# Patient Record
Sex: Female | Born: 1995 | Hispanic: No | Marital: Single | Smoking: Never smoker
Health system: Southern US, Community
[De-identification: ages and names within clinical notes are randomized; demographics above are authoritative.]

## PROBLEM LIST (undated history)

## (undated) ENCOUNTER — Inpatient Hospital Stay (HOSPITAL_COMMUNITY): Payer: Self-pay

## (undated) ENCOUNTER — Emergency Department (HOSPITAL_COMMUNITY): Admission: EM | Payer: Self-pay | Source: Home / Self Care

## (undated) DIAGNOSIS — N390 Urinary tract infection, site not specified: Secondary | ICD-10-CM

## (undated) DIAGNOSIS — O1490 Unspecified pre-eclampsia, unspecified trimester: Secondary | ICD-10-CM

## (undated) DIAGNOSIS — A539 Syphilis, unspecified: Secondary | ICD-10-CM

## (undated) DIAGNOSIS — A749 Chlamydial infection, unspecified: Secondary | ICD-10-CM

## (undated) DIAGNOSIS — A549 Gonococcal infection, unspecified: Secondary | ICD-10-CM

## (undated) DIAGNOSIS — B999 Unspecified infectious disease: Secondary | ICD-10-CM

## (undated) DIAGNOSIS — D3A8 Other benign neuroendocrine tumors: Secondary | ICD-10-CM

## (undated) DIAGNOSIS — A64 Unspecified sexually transmitted disease: Secondary | ICD-10-CM

## (undated) DIAGNOSIS — I10 Essential (primary) hypertension: Secondary | ICD-10-CM

## (undated) HISTORY — PX: OTHER SURGICAL HISTORY: SHX169

## (undated) HISTORY — PX: HAND SURGERY: SHX662

## (undated) HISTORY — DX: Chlamydial infection, unspecified: A74.9

## (undated) HISTORY — PX: RHYTIDECTOMY NECK / CHEEK / CHIN: SUR1286

## (undated) HISTORY — DX: Unspecified sexually transmitted disease: A64

---
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
## 2014-01-14 ENCOUNTER — Emergency Department (HOSPITAL_COMMUNITY)
Admission: EM | Admit: 2014-01-14 | Discharge: 2014-01-14 | Disposition: A | Discharge status: Home / Self Care | Payer: Medicaid Other | Attending: Emergency Medicine | Admitting: Emergency Medicine

## 2014-01-14 DIAGNOSIS — R109 Unspecified abdominal pain: Secondary | ICD-10-CM

## 2014-01-14 DIAGNOSIS — R1031 Right lower quadrant pain: Secondary | ICD-10-CM | POA: Insufficient documentation

## 2014-01-14 DIAGNOSIS — M549 Dorsalgia, unspecified: Secondary | ICD-10-CM | POA: Insufficient documentation

## 2014-01-14 DIAGNOSIS — O26899 Other specified pregnancy related conditions, unspecified trimester: Secondary | ICD-10-CM

## 2014-01-14 DIAGNOSIS — O9989 Other specified diseases and conditions complicating pregnancy, childbirth and the puerperium: Secondary | ICD-10-CM | POA: Insufficient documentation

## 2014-01-14 LAB — CBC WITH DIFFERENTIAL/PLATELET
Basophils Absolute: 0 10*3/uL (ref 0.0–0.1)
Basophils Relative: 0 % (ref 0–1)
Eosinophils Absolute: 0.1 10*3/uL (ref 0.0–0.7)
Eosinophils Relative: 1 % (ref 0–5)
HCT: 32 % — ABNORMAL LOW (ref 36.0–46.0)
Hemoglobin: 10.9 g/dL — ABNORMAL LOW (ref 12.0–15.0)
Lymphocytes Relative: 27 % (ref 12–46)
Lymphs Abs: 2.8 10*3/uL (ref 0.7–4.0)
MCH: 30.4 pg (ref 26.0–34.0)
MCHC: 34.1 g/dL (ref 30.0–36.0)
MCV: 89.4 fL (ref 78.0–100.0)
Monocytes Absolute: 0.6 10*3/uL (ref 0.1–1.0)
Monocytes Relative: 6 % (ref 3–12)
Neutro Abs: 6.7 10*3/uL (ref 1.7–7.7)
Neutrophils Relative %: 66 % (ref 43–77)
Platelets: 238 10*3/uL (ref 150–400)
RBC: 3.58 MIL/uL — ABNORMAL LOW (ref 3.87–5.11)
RDW: 13.1 % (ref 11.5–15.5)
WBC: 10.2 10*3/uL (ref 4.0–10.5)

## 2014-01-14 LAB — URINALYSIS, ROUTINE W REFLEX MICROSCOPIC
BILIRUBIN URINE: NEGATIVE
Glucose, UA: NEGATIVE mg/dL
Hgb urine dipstick: NEGATIVE
KETONES UR: NEGATIVE mg/dL
NITRITE: NEGATIVE
PH: 6.5 (ref 5.0–8.0)
Protein, ur: NEGATIVE mg/dL
Specific Gravity, Urine: 1.026 (ref 1.005–1.030)
Urobilinogen, UA: 0.2 mg/dL (ref 0.0–1.0)

## 2014-01-14 LAB — URINE MICROSCOPIC-ADD ON

## 2014-01-14 LAB — BASIC METABOLIC PANEL
BUN: 11 mg/dL (ref 6–23)
CO2: 23 mEq/L (ref 19–32)
Calcium: 9.2 mg/dL (ref 8.4–10.5)
Chloride: 105 mEq/L (ref 96–112)
Creatinine, Ser: 0.53 mg/dL (ref 0.50–1.10)
GFR calc Af Amer: >90 mL/min (ref >90)
GFR calc non Af Amer: >90 mL/min (ref >90)
Glucose, Bld: 75 mg/dL (ref 70–99)
Potassium: 4 mEq/L (ref 3.7–5.3)
Sodium: 141 mEq/L (ref 137–147)

## 2014-01-14 LAB — POC URINE PREG, ED: Preg Test, Ur: POSITIVE — AB

## 2014-01-14 MED ORDER — SODIUM CHLORIDE 0.9 % IV BOLUS (SEPSIS)
1000.0000 mL | Freq: Once | INTRAVENOUS | Status: AC
  Administered 2014-01-14: 1000 mL via INTRAVENOUS

## 2014-01-14 NOTE — ED Notes (Signed)
Dr. Jeraldine LootsLockwood at performing bedside US exam.
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
## 2014-01-14 NOTE — ED Notes (Signed)
Discharge instructions reviewed with pt. Pt verbalized understanding.   

## 2014-01-14 NOTE — Discharge Instructions (Signed)
You may take acetaminophen every 4-6 hours as needed for pain.   You may also try alternating cool and warm compresses to help ease pain.  Be sure to walk at least 30 minutes a day.  See below for further instructions.
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
## 2014-01-15 NOTE — ED Provider Notes (Signed)
  This was a shared visit with a mid-level provided (NP or PA).  Throughout the patient's course I was available for consultation/collaboration.  I saw the ECG (if appropriate), relevant labs and studies - I agree with the interpretation.  On my exam the patient was in no distress.    EMERGENCY DEPARTMENT US PREGNANCY "Study: Limited Ultrasound of the Pelvis"  INDICATIONS:Pregnancy(required) Multiple views of the uterus and pelvic cavity are obtained with a multi-frequency probe.  APPROACH:Transabdominal   PERFORMED BY: Myself  IMAGES ARCHIVED?: No - guest machine  LIMITATIONS: Decompressed bladder and Emergent procedure  PREGNANCY FREE FLUID: None  PREGNANCY UTERUS FINDINGS:Uterus normal size ADNEXAL FINDINGS:Left ovary not seen and Right ovary not seen  PREGNANCY FINDINGS: Intrauterine gestational sac noted and Fetal heart activity seen  INTERPRETATION: Viable intrauterine pregnancy  GESTATIONAL AGE, ESTIMATE: ~3793m  FETAL HEART RATE: 140-150  COMMENT(Estimate of Gestational Age):  ~5     Gerhard Munchobert Glora Hulgan, MD 01/15/14 2011

## 2014-02-08 ENCOUNTER — Emergency Department (HOSPITAL_COMMUNITY)
Admission: EM | Admit: 2014-02-08 | Discharge: 2014-02-09 | Disposition: A | Discharge status: Home / Self Care | Payer: Medicaid Other | Attending: Emergency Medicine | Admitting: Emergency Medicine

## 2014-02-08 ENCOUNTER — Encounter (HOSPITAL_COMMUNITY): Payer: Self-pay | Admitting: Emergency Medicine

## 2014-02-08 DIAGNOSIS — Z79899 Other long term (current) drug therapy: Secondary | ICD-10-CM | POA: Insufficient documentation

## 2014-02-08 DIAGNOSIS — O209 Hemorrhage in early pregnancy, unspecified: Secondary | ICD-10-CM | POA: Insufficient documentation

## 2014-02-08 DIAGNOSIS — O4690 Antepartum hemorrhage, unspecified, unspecified trimester: Secondary | ICD-10-CM

## 2014-02-08 LAB — CBC WITH DIFFERENTIAL/PLATELET
Basophils Absolute: 0 10*3/uL (ref 0.0–0.1)
Basophils Relative: 0 % (ref 0–1)
EOS PCT: 1 % (ref 0–5)
Eosinophils Absolute: 0.1 10*3/uL (ref 0.0–0.7)
HEMATOCRIT: 32.1 % — AB (ref 36.0–46.0)
Hemoglobin: 10.8 g/dL — ABNORMAL LOW (ref 12.0–15.0)
LYMPHS ABS: 3 10*3/uL (ref 0.7–4.0)
LYMPHS PCT: 22 % (ref 12–46)
MCH: 30.3 pg (ref 26.0–34.0)
MCHC: 33.6 g/dL (ref 30.0–36.0)
MCV: 89.9 fL (ref 78.0–100.0)
MONO ABS: 0.8 10*3/uL (ref 0.1–1.0)
Monocytes Relative: 6 % (ref 3–12)
Neutro Abs: 9.7 10*3/uL — ABNORMAL HIGH (ref 1.7–7.7)
Neutrophils Relative %: 71 % (ref 43–77)
Platelets: 256 10*3/uL (ref 150–400)
RBC: 3.57 MIL/uL — AB (ref 3.87–5.11)
RDW: 13.9 % (ref 11.5–15.5)
WBC: 13.7 10*3/uL — AB (ref 4.0–10.5)

## 2014-02-08 LAB — BASIC METABOLIC PANEL
Anion gap: 12 (ref 5–15)
BUN: 8 mg/dL (ref 6–23)
CALCIUM: 9.2 mg/dL (ref 8.4–10.5)
CHLORIDE: 104 meq/L (ref 96–112)
CO2: 23 meq/L (ref 19–32)
Creatinine, Ser: 0.48 mg/dL — ABNORMAL LOW (ref 0.50–1.10)
GFR calc Af Amer: >90 mL/min (ref >90)
GFR calc non Af Amer: >90 mL/min (ref >90)
GLUCOSE: 81 mg/dL (ref 70–99)
Potassium: 3.7 mEq/L (ref 3.7–5.3)
SODIUM: 139 meq/L (ref 137–147)

## 2014-02-08 LAB — WET PREP, GENITAL
CLUE CELLS WET PREP: NONE SEEN
TRICH WET PREP: NONE SEEN
WBC, Wet Prep HPF POC: NONE SEEN
YEAST WET PREP: NONE SEEN

## 2014-02-08 LAB — ABO/RH: ABO/RH(D): O POS

## 2014-02-08 NOTE — ED Notes (Signed)
To ED POV for eval of vaginal bleeding after intercourse. Pt is [redacted] wks gestation. LMP 2/20 EDD 11/27. Pt denies abd pain but states she hasn't felt the baby move since bleeding started. States bleeding is intermittent now, but initially was a 'gush'

## 2014-02-08 NOTE — Discharge Instructions (Signed)
Vaginal Bleeding During Pregnancy, Second Trimester °A small amount of bleeding (spotting) from the vagina is relatively common in pregnancy. It usually stops on its own. Various things can cause bleeding or spotting in pregnancy. Some bleeding may be related to the pregnancy, and some may not. Sometimes the bleeding is normal and is not a problem. However, bleeding can also be a sign of something serious. Be sure to tell your health care provider about any vaginal bleeding right away. °Some possible causes of vaginal bleeding during the second trimester include: °· Infection, inflammation, or growths on the cervix.   °· The placenta may be partially or completely covering the opening of the cervix inside the uterus (placenta previa). °· The placenta may have separated from the uterus (abruption of the placenta).   °· You may be having early (preterm) labor.   °· The cervix may not be strong enough to keep a baby inside the uterus (cervical insufficiency).   °· Tiny cysts may have developed in the uterus instead of pregnancy tissue (molar pregnancy).  °HOME CARE INSTRUCTIONS  °Watch your condition for any changes. The following actions may help to lessen any discomfort you are feeling: °· Follow your health care provider's instructions for limiting your activity. If your health care provider orders bed rest, you may need to stay in bed and only get up to use the bathroom. However, your health care provider may allow you to continue light activity. °· If needed, make plans for someone to help with your regular activities and responsibilities while you are on bed rest. °· Keep track of the number of pads you use each day, how often you change pads, and how soaked (saturated) they are. Write this down. °· Do not use tampons. Do not douche. °· Do not have sexual intercourse or orgasms until approved by your health care provider. °· If you pass any tissue from your vagina, save the tissue so you can show it to your  health care provider. °· Only take over-the-counter or prescription medicines as directed by your health care provider. °· Do not take aspirin because it can make you bleed. °· Do not exercise or perform any strenuous activities or heavy lifting without your health care provider's permission. °· Keep all follow-up appointments as directed by your health care provider. °SEEK MEDICAL CARE IF: °· You have any vaginal bleeding during any part of your pregnancy. °· You have cramps or labor pains. °· You have a fever, not controlled by medicine. °SEEK IMMEDIATE MEDICAL CARE IF:  °· You have severe cramps in your back or belly (abdomen). °· You have contractions. °· You have chills. °· You pass large clots or tissue from your vagina. °· Your bleeding increases. °· You feel light-headed or weak, or you have fainting episodes. °· You are leaking fluid or have a gush of fluid from your vagina. °MAKE SURE YOU: °· Understand these instructions. °· Will watch your condition. °· Will get help right away if you are not doing well or get worse. °Document Released: 04/13/2005 Document Revised: 07/09/2013 Document Reviewed: 03/11/2013 °Exit[employer]® Patient Information ©2015 [employer]. This information is not intended to replace advice given to you by your health care provider. Make sure you discuss any questions you have with your health care provider. ° °

## 2014-02-08 NOTE — ED Provider Notes (Signed)
CSN: [id number]     Arrival date & time 02/08/14  2106 History   First MD Initiated Contact with Patient 02/08/14 2128     Chief Complaint  Patient presents with  . Vaginal Bleeding     (Consider location/radiation/quality/duration/timing/severity/associated sxs/prior Treatment) HPI Comments: Patient presents to the emergency department with chief complaint of vaginal bleeding. She is [redacted] weeks pregnant. She has had a confirmed IUP with formal ultrasound, as well bedside emergency department ultrasound earlier this month. She denies any abdominal pain. She states the bleeding started this evening, and she reports that it was like an initial gush of blood, which then slowed to a trickle. Patient reports having a history of miscarriage. Her OB/GYN is Dr. Tenny Crawoss  The history is provided by the patient. No language interpreter was used.    History reviewed. No pertinent past medical history. Past Surgical History  Procedure Laterality Date  . Rhytidectomy neck / cheek / chin    . Hand surgery      Left  hand  . Hand surgery      left   . Chin surgery     History reviewed. No pertinent family history. History  Substance Use Topics  . Smoking status: Never Smoker   . Smokeless tobacco: Never Used  . Alcohol Use: No   OB History   Grav Para Term Preterm Abortions TAB SAB Ect Mult Living   1              Review of Systems  All other systems reviewed and are negative.     Allergies  Review of patient's allergies indicates no known allergies.  Home Medications   Prior to Admission medications   Medication Sig Start Date End Date Taking? Authorizing Provider  acetaminophen (TYLENOL) 500 MG tablet Take 500 mg by mouth every 6 (six) hours as needed.    Historical Provider, MD   BP 118/63  Pulse 94  Temp(Src) 98.6 F (37 C)  Resp 16  SpO2 100%  LMP 09/06/2013 Physical Exam  Nursing note and vitals reviewed. Constitutional: She is oriented to person, place, and time. She  appears well-developed and well-nourished.  HENT:  Head: Normocephalic and atraumatic.  Eyes: Conjunctivae and EOM are normal. Pupils are equal, round, and reactive to light.  Neck: Normal range of motion. Neck supple.  Cardiovascular: Normal rate and regular rhythm.  Exam reveals no gallop and no friction rub.   No murmur heard. Pulmonary/Chest: Effort normal and breath sounds normal. No respiratory distress. She has no wheezes. She has no rales. She exhibits no tenderness.  Abdominal: Soft. Bowel sounds are normal. She exhibits no distension and no mass. There is no tenderness. There is no rebound and no guarding.  No focal abdominal tenderness, no RLQ tenderness or pain at McBurney's point, no RUQ tenderness or Murphy's sign, no left-sided abdominal tenderness, no fluid wave, or signs of peritonitis   Genitourinary:  Chaperone present for pelvic exam  Cervical os is closed visually and with palpation, there is a small amount of bleeding noted from the os, no obvious hemorrhage, there is some blood in the vaginal vault, no adnexal tenderness bilaterally, no uterine tenderness  Musculoskeletal: Normal range of motion. She exhibits no edema and no tenderness.  Neurological: She is alert and oriented to person, place, and time.  Skin: Skin is warm and dry.  Psychiatric: She has a normal mood and affect. Her behavior is normal. Judgment and thought content normal.    ED Course  Procedures (including critical care time) Results for orders placed during the hospital encounter of 02/08/14  WET PREP, GENITAL      Result Value Ref Range   Yeast Wet Prep HPF POC NONE SEEN  NONE SEEN   Trich, Wet Prep NONE SEEN  NONE SEEN   Clue Cells Wet Prep HPF POC NONE SEEN  NONE SEEN   WBC, Wet Prep HPF POC NONE SEEN  NONE SEEN  CBC WITH DIFFERENTIAL      Result Value Ref Range   WBC 13.7 (*) 4.0 - 10.5 K/uL   RBC 3.57 (*) 3.87 - 5.11 MIL/uL   Hemoglobin 10.8 (*) 12.0 - 15.0 g/dL   HCT 16.1 (*) 09.6 -  46.0 %   MCV 89.9  78.0 - 100.0 fL   MCH 30.3  26.0 - 34.0 pg   MCHC 33.6  30.0 - 36.0 g/dL   RDW 04.5  40.9 - 81.1 %   Platelets 256  150 - 400 K/uL   Neutrophils Relative % 71  43 - 77 %   Neutro Abs 9.7 (*) 1.7 - 7.7 K/uL   Lymphocytes Relative 22  12 - 46 %   Lymphs Abs 3.0  0.7 - 4.0 K/uL   Monocytes Relative 6  3 - 12 %   Monocytes Absolute 0.8  0.1 - 1.0 K/uL   Eosinophils Relative 1  0 - 5 %   Eosinophils Absolute 0.1  0.0 - 0.7 K/uL   Basophils Relative 0  0 - 1 %   Basophils Absolute 0.0  0.0 - 0.1 K/uL  BASIC METABOLIC PANEL      Result Value Ref Range   Sodium 139  137 - 147 mEq/L   Potassium 3.7  3.7 - 5.3 mEq/L   Chloride 104  96 - 112 mEq/L   CO2 23  19 - 32 mEq/L   Glucose, Bld 81  70 - 99 mg/dL   BUN 8  6 - 23 mg/dL   Creatinine, Ser 9.14 (*) 0.50 - 1.10 mg/dL   Calcium 9.2  8.4 - 78.2 mg/dL   GFR calc non Af Amer >90  >90 mL/min   GFR calc Af Amer >90  >90 mL/min   Anion gap 12  5 - 15  ABO/RH      Result Value Ref Range   ABO/RH(D) O POS     No rh immune globuloin NOT A RH IMMUNE GLOBULIN CANDIDATE, PT RH POSITIVE     No results found.   Imaging Review No results found.   EKG Interpretation None      MDM   Final diagnoses:  Vaginal bleeding in pregnancy, unspecified trimester    Patient with vaginal bleeding. She is [redacted] weeks pregnant. No abdominal pain. Cervix is closed, no adnexal tenderness, there is a small amount of bleeding, but no evidence of active hemorrhage.  Patient seen by a rapid response of the nurse. Patient has normal fetal heart tones. This patient was discussed with Dr. Claiborne Billings, who is on-call for OB/GYN. Ultrasound findings were reviewed by Dr. Claiborne Billings, the placenta is not in a compromised position. Dr. Claiborne Billings feels the patient may be discharged to home.  As there is no evidence of hemorrhage, and no abdominal pain, will discharge the patient to home. Recommend OB/GYN followup.  11:54 PM Patient discussed with Dr.  Fredderick Phenix.  No additional bleeding.  DC to home. With OBGYN follow-up.   Roxy Horseman, PA-C 02/08/14 2355

## 2014-02-08 NOTE — Progress Notes (Signed)
RROB spoke with Dr Buford Dresserallahan(OB provider on call for pt's group), told of pt with scant/small amount of red vaginal bleeding after intercourse.  FHR doppler 150's, no complaints of pain or cramping; ED physician performed pelvic exam and saw a small amount of blood in vaginal vault, did not see any active bleeding, cervix appears closed and found to be closed on digital exam.Dr Claiborne BillingsCallahan looked at pt chart and looked at placental location from last u/s.  Orders that pt may be discharged to home.  RROB spoke with ED physician and told of consult with Dr Claiborne Billingsallahan, pt to be discharged after lab work is resulted.  RROB [facility] pt and significant other [facility], given directions/address, also told pt of ability to call Dr office and it will direct her on how to speak with on call provider.  Told pt that if she has any concerns, increased bleeding then to contact Dr or [facility].  Pt verbalized understanding.

## 2014-02-08 NOTE — ED Notes (Signed)
OB Rapid Response nurse at the bedside. Fetal Heart Monitor placed on patient. FHT found. FHT 152.

## 2014-02-08 NOTE — ED Notes (Signed)
Pelvic Exam completed by PA Rob. Assisted.

## 2014-02-09 NOTE — ED Provider Notes (Signed)
Medical screening examination/treatment/procedure(s) were performed by non-physician practitioner and as supervising physician I was immediately available for consultation/collaboration.   EKG Interpretation None        Rolan BuccoMelanie Maylee Bare, MD 02/09/14 0009

## 2014-02-11 LAB — GC/CHLAMYDIA PROBE AMP
CT Probe RNA: NEGATIVE
GC PROBE AMP APTIMA: NEGATIVE

## 2014-03-07 LAB — OB RESULTS CONSOLE HEPATITIS B SURFACE ANTIGEN: Hepatitis B Surface Ag: NEGATIVE

## 2014-03-07 LAB — OB RESULTS CONSOLE GC/CHLAMYDIA
Chlamydia: NEGATIVE
Gonorrhea: NEGATIVE

## 2014-03-07 LAB — OB RESULTS CONSOLE HIV ANTIBODY (ROUTINE TESTING): HIV: NONREACTIVE

## 2014-03-07 LAB — OB RESULTS CONSOLE RPR: RPR: NONREACTIVE

## 2014-03-07 LAB — OB RESULTS CONSOLE RUBELLA ANTIBODY, IGM: Rubella: IMMUNE

## 2014-03-22 ENCOUNTER — Inpatient Hospital Stay (HOSPITAL_COMMUNITY)
Admission: AD | Admit: 2014-03-22 | Discharge: 2014-03-22 | Disposition: A | Discharge status: Home / Self Care | Payer: Medicaid Other | Source: Ambulatory Visit | Attending: Obstetrics and Gynecology | Admitting: Obstetrics and Gynecology

## 2014-03-22 ENCOUNTER — Encounter (HOSPITAL_COMMUNITY): Payer: Self-pay | Admitting: *Deleted

## 2014-03-22 DIAGNOSIS — O368121 Decreased fetal movements, second trimester, fetus 1: Secondary | ICD-10-CM

## 2014-03-22 DIAGNOSIS — O3090 Multiple gestation, unspecified, unspecified trimester: Secondary | ICD-10-CM

## 2014-03-22 DIAGNOSIS — O368190 Decreased fetal movements, unspecified trimester, not applicable or unspecified: Secondary | ICD-10-CM | POA: Diagnosis present

## 2014-03-22 LAB — URINALYSIS, ROUTINE W REFLEX MICROSCOPIC
BILIRUBIN URINE: NEGATIVE
GLUCOSE, UA: NEGATIVE mg/dL
HGB URINE DIPSTICK: NEGATIVE
KETONES UR: NEGATIVE mg/dL
NITRITE: NEGATIVE
PH: 7 (ref 5.0–8.0)
Protein, ur: NEGATIVE mg/dL
Specific Gravity, Urine: 1.015 (ref 1.005–1.030)
Urobilinogen, UA: 0.2 mg/dL (ref 0.0–1.0)

## 2014-03-22 LAB — URINE MICROSCOPIC-ADD ON

## 2014-03-22 NOTE — MAU Note (Signed)
Patient presents with complaint of decreased fetal movement since last night. 

## 2014-03-22 NOTE — Discharge Instructions (Signed)

## 2014-03-22 NOTE — MAU Provider Note (Signed)
History     CSN: [id number]  Arrival date and time: 03/22/14 1411   None     No chief complaint on file.  HPI  [name] is a 17 y.o. female G2P0010 at [redacted]w[redacted]d who presents with decreased fetal movement for 24 hours. Since her arrival here she has felt her baby move normally while on the monitor. Her next appointment is September 30th.    OB History   Grav Para Term Preterm Abortions TAB SAB Ect Mult Living        No past medical history on file.  Past Surgical History  Procedure Laterality Date  . Rhytidectomy neck / cheek / chin    . Hand surgery      Left  hand  . Hand surgery      left   . Chin surgery      No family history on file.  History  Substance Use Topics  . Smoking status: Never Smoker   . Smokeless tobacco: Never Used  . Alcohol Use: No    Allergies: No Known Allergies  Prescriptions prior to admission  Medication Sig Dispense Refill  . acetaminophen (TYLENOL) 500 MG tablet Take 500 mg by mouth every 6 (six) hours as needed.      . Prenatal Vit-Fe Fumarate-FA (PRENATAL VITAMIN PO) Take 1 tablet by mouth daily.       Results for orders placed during the hospital encounter of 03/22/14 (from the past 48 hour(s))  URINALYSIS, ROUTINE W REFLEX MICROSCOPIC     Status: Abnormal   Collection Time    03/22/14  2:32 PM      Result Value Ref Range   Color, Urine YELLOW  YELLOW   APPearance HAZY (*) CLEAR   Specific Gravity, Urine 1.015  1.005 - 1.030   pH 7.0  5.0 - 8.0   Glucose, UA NEGATIVE  NEGATIVE mg/dL   Hgb urine dipstick NEGATIVE  NEGATIVE   Bilirubin Urine NEGATIVE  NEGATIVE   Ketones, ur NEGATIVE  NEGATIVE mg/dL   Protein, ur NEGATIVE  NEGATIVE mg/dL   Urobilinogen, UA 0.2  0.0 - 1.0 mg/dL   Nitrite NEGATIVE  NEGATIVE   Leukocytes, UA MODERATE (*) NEGATIVE  URINE MICROSCOPIC-ADD ON     Status: Abnormal   Collection Time    03/22/14  2:32 PM      Result Value Ref Range   Squamous Epithelial / LPF MANY (*)  RARE   WBC, UA 7-10  <3 WBC/hpf   RBC / HPF 0-2  <3 RBC/hpf   Bacteria, UA MANY (*) RARE   Urine-Other AMORPHOUS URATES/PHOSPHATES      Review of Systems  Constitutional: Negative for fever and chills.  Gastrointestinal: Negative for nausea, vomiting, abdominal pain, diarrhea and constipation.  Genitourinary: Negative for dysuria, urgency and frequency.   Physical Exam   Blood pressure 128/57, pulse 95, temperature 98.7 F (37.1 C), temperature source Oral, last menstrual period 09/06/2013, SpO2 100.00%.  Physical Exam  Constitutional: She is oriented to person, place, and time. She appears well-developed and well-nourished.  HENT:  Head: Normocephalic.  Eyes: Pupils are equal, round, and reactive to light.  Neck: Neck supple.  Respiratory: Effort normal.  Musculoskeletal: Normal range of motion.  Neurological: She is alert and oriented to person, place, and time.  Skin: Skin is warm.  Psychiatric: Her behavior is normal.   Fetal Tracing: Baseline: 140 bpm  Variability: Moderate  Accelerations: 15x15  Decelerations: occasional variable  Toco: none   MAU Course  Procedures None  MDM Discussed fetal strip with Dr. Claiborne Billings. Ok to discharge the patient home.  Urine culture  Assessment and Plan   A: 1. Decreased fetal movement, second trimester, fetus 1    P: Discharge home in stable condition Kick counts Return to MAU as needed Follow up with Dr. Claiborne Billings as scheduled  Nicole Mclean Nicole Schoenfeldt, NP 03/22/2014, 2:47 PM

## 2014-03-22 NOTE — MAU Note (Signed)
Repositioned so that patient can eat lunch.

## 2014-03-24 LAB — URINE CULTURE: Special Requests: NORMAL

## 2014-04-09 ENCOUNTER — Encounter (HOSPITAL_COMMUNITY): Payer: Self-pay | Admitting: *Deleted

## 2014-04-09 ENCOUNTER — Inpatient Hospital Stay (HOSPITAL_COMMUNITY)
Admission: AD | Admit: 2014-04-09 | Discharge: 2014-04-09 | Disposition: A | Discharge status: Home / Self Care | Payer: Medicaid Other | Source: Ambulatory Visit | Attending: Obstetrics | Admitting: Obstetrics

## 2014-04-09 DIAGNOSIS — O479 False labor, unspecified: Secondary | ICD-10-CM | POA: Insufficient documentation

## 2014-04-09 DIAGNOSIS — K5289 Other specified noninfective gastroenteritis and colitis: Secondary | ICD-10-CM | POA: Insufficient documentation

## 2014-04-09 DIAGNOSIS — O99891 Other specified diseases and conditions complicating pregnancy: Secondary | ICD-10-CM | POA: Diagnosis not present

## 2014-04-09 DIAGNOSIS — N390 Urinary tract infection, site not specified: Secondary | ICD-10-CM | POA: Diagnosis not present

## 2014-04-09 DIAGNOSIS — O9989 Other specified diseases and conditions complicating pregnancy, childbirth and the puerperium: Secondary | ICD-10-CM

## 2014-04-09 DIAGNOSIS — O2390 Unspecified genitourinary tract infection in pregnancy, unspecified trimester: Secondary | ICD-10-CM | POA: Insufficient documentation

## 2014-04-09 HISTORY — DX: Gonococcal infection, unspecified: A54.9

## 2014-04-09 LAB — URINALYSIS, ROUTINE W REFLEX MICROSCOPIC
BILIRUBIN URINE: NEGATIVE
GLUCOSE, UA: NEGATIVE mg/dL
Ketones, ur: NEGATIVE mg/dL
Nitrite: NEGATIVE
PH: 7 (ref 5.0–8.0)
Protein, ur: NEGATIVE mg/dL
SPECIFIC GRAVITY, URINE: 1.01 (ref 1.005–1.030)
Urobilinogen, UA: 0.2 mg/dL (ref 0.0–1.0)

## 2014-04-09 LAB — URINE MICROSCOPIC-ADD ON

## 2014-04-09 MED ORDER — LACTATED RINGERS IV BOLUS (SEPSIS)
1000.0000 mL | Freq: Once | INTRAVENOUS | Status: AC
  Administered 2014-04-09: 1000 mL via INTRAVENOUS

## 2014-04-09 MED ORDER — ONDANSETRON 4 MG PO TBDP: 4.0000 mg | ORAL_TABLET | Freq: Once | ORAL | Status: DC

## 2014-04-09 MED ORDER — PROMETHAZINE HCL 25 MG/ML IJ SOLN
12.5000 mg | Freq: Once | INTRAMUSCULAR | Status: AC
  Administered 2014-04-09: 12.5 mg via INTRAVENOUS
  Filled 2014-04-09: qty 1

## 2014-04-09 MED ORDER — NITROFURANTOIN MONOHYD MACRO 100 MG PO CAPS: 100.0000 mg | ORAL_CAPSULE | Freq: Two times a day (BID) | ORAL | Status: DC

## 2014-04-09 MED ORDER — PROMETHAZINE HCL 25 MG PO TABS: 25.0000 mg | ORAL_TABLET | Freq: Four times a day (QID) | ORAL | Status: DC | PRN

## 2014-04-09 NOTE — MAU Provider Note (Signed)
History     CSN: [id number]  Arrival date and time: 04/09/14 1123   First Provider Initiated Contact with Patient 04/09/14 1209      No chief complaint on file.  HPI  Ms Nicole Mclean is a 18 y.o. female who presents with contractions that started this morning. The contractions were coming every 2-3 minutes and lasting 1 minuet to 1.5 minutes. "These contractions do not feel like braxton hicks contraction". She feels she has stayed well hydrated.  She has vomited 5 times today and is having loose stool.   Currently she denies pain.  She had sex yesterday at 5:00 pm; fetal fibronectin will not be collected   OB History   Grav Para Term Preterm Abortions TAB SAB Ect Mult Living        Past Medical History  Diagnosis Date  . Medical history non-contributory   . Gonorrhea April 2015    Past Surgical History  Procedure Laterality Date  . Rhytidectomy neck / cheek / chin    . Hand surgery      Left  hand  . Hand surgery      left   . Chin surgery      History reviewed. No pertinent family history.  History  Substance Use Topics  . Smoking status: Never Smoker   . Smokeless tobacco: Never Used  . Alcohol Use: No    Allergies: No Known Allergies  Prescriptions prior to admission  Medication Sig Dispense Refill  . Prenatal Vit-Fe Fumarate-FA (PRENATAL VITAMIN PO) Take 1 tablet by mouth daily.       Results for orders placed during the hospital encounter of 04/09/14 (from the past 48 hour(s))  URINALYSIS, ROUTINE W REFLEX MICROSCOPIC     Status: Abnormal   Collection Time    04/09/14 11:40 AM      Result Value Ref Range   Color, Urine YELLOW  YELLOW   APPearance CLEAR  CLEAR   Specific Gravity, Urine 1.010  1.005 - 1.030   pH 7.0  5.0 - 8.0   Glucose, UA NEGATIVE  NEGATIVE mg/dL   Hgb urine dipstick TRACE (*) NEGATIVE   Bilirubin Urine NEGATIVE  NEGATIVE   Ketones, ur NEGATIVE  NEGATIVE mg/dL   Protein, ur NEGATIVE  NEGATIVE mg/dL    Urobilinogen, UA 0.2  0.0 - 1.0 mg/dL   Nitrite NEGATIVE  NEGATIVE   Leukocytes, UA LARGE (*) NEGATIVE  URINE MICROSCOPIC-ADD ON     Status: Abnormal   Collection Time    04/09/14 11:40 AM      Result Value Ref Range   Squamous Epithelial / LPF RARE  RARE   WBC, UA 11-20  <3 WBC/hpf   Bacteria, UA FEW (*) RARE    Review of Systems  Gastrointestinal: Positive for nausea and vomiting (patient has vomited 5 times today. ).   Physical Exam   Blood pressure 119/72, pulse 106, temperature 98.5 F (36.9 C), temperature source Oral, resp. rate 18, height  (1.549 m), weight 90.719 kg (200 lb), last menstrual period 09/06/2013.  Physical Exam  Constitutional: She is oriented to person, place, and time. She appears well-developed and well-nourished. No distress.  HENT:  Head: Normocephalic.  Eyes: Pupils are equal, round, and reactive to light.  Neck: Neck supple.  Respiratory: Effort normal.  GI: Soft. She exhibits no distension. There is no tenderness. There is no rebound.  Musculoskeletal: Normal range of motion.  Neurological:  She is alert and oriented to person, place, and time.  Skin: Skin is warm. She is not diaphoretic.  Psychiatric: Her behavior is normal.     Dilation: Closed Effacement (%): Thick Cervical Position: Middle Station: -3 Presentation: Undeterminable Exam by:: s. Gabriel Earing, RNC  Fetal Tracing: Baseline: 135 bpm  Variability: Moderate  Accelerations: 10x10 and 15x15 Decelerations: none Toco: Occasional UI    MAU Course  Procedures None  MDM Unable to collect fetal fibronectin> see HPI  Discussed findings with Dr. Chestine Spore  LR bolus with phenergan Patient denies pain at the time of discharge, however is having some diarrhea > we discussed this may be GI related and may last 24-48 hours.  Urine culture- pending  Assessment and Plan   A: 1. UTI (lower urinary tract infection)   2. Braxton Hicks contractions   3.  Gastroenteritis    P:  Discharge home in stable condition RX: Phenergan, Macrobid  Ok to take imodium over the counter  Return to MAU if symptoms worsen Preterm labor precautions   Iona Hansen Finch Costanzo, NP  04/09/2014, 12:09 PM

## 2014-04-09 NOTE — MAU Note (Signed)
Pt states she began having braxton hicks contractions this morning.  After an hour, contractions started moving to the back and pt states that she started to become nauseated, light headed, and began vomiting.

## 2014-04-10 LAB — OB RESULTS CONSOLE GBS: STREP GROUP B AG: POSITIVE

## 2014-04-12 ENCOUNTER — Inpatient Hospital Stay (HOSPITAL_COMMUNITY)
Admission: AD | Admit: 2014-04-12 | Discharge: 2014-04-12 | Disposition: A | Discharge status: Home / Self Care | Payer: Medicaid Other | Source: Ambulatory Visit | Attending: Obstetrics & Gynecology | Admitting: Obstetrics & Gynecology

## 2014-04-12 ENCOUNTER — Encounter (HOSPITAL_COMMUNITY): Payer: Self-pay | Admitting: *Deleted

## 2014-04-12 DIAGNOSIS — N390 Urinary tract infection, site not specified: Secondary | ICD-10-CM | POA: Diagnosis not present

## 2014-04-12 DIAGNOSIS — O2390 Unspecified genitourinary tract infection in pregnancy, unspecified trimester: Secondary | ICD-10-CM | POA: Insufficient documentation

## 2014-04-12 DIAGNOSIS — O99891 Other specified diseases and conditions complicating pregnancy: Secondary | ICD-10-CM | POA: Diagnosis not present

## 2014-04-12 DIAGNOSIS — K5289 Other specified noninfective gastroenteritis and colitis: Secondary | ICD-10-CM | POA: Diagnosis not present

## 2014-04-12 DIAGNOSIS — O212 Late vomiting of pregnancy: Secondary | ICD-10-CM | POA: Diagnosis present

## 2014-04-12 DIAGNOSIS — O9989 Other specified diseases and conditions complicating pregnancy, childbirth and the puerperium: Principal | ICD-10-CM

## 2014-04-12 DIAGNOSIS — K529 Noninfective gastroenteritis and colitis, unspecified: Secondary | ICD-10-CM

## 2014-04-12 LAB — URINALYSIS, ROUTINE W REFLEX MICROSCOPIC
Bilirubin Urine: NEGATIVE
GLUCOSE, UA: NEGATIVE mg/dL
Hgb urine dipstick: NEGATIVE
KETONES UR: NEGATIVE mg/dL
NITRITE: NEGATIVE
PROTEIN: NEGATIVE mg/dL
Specific Gravity, Urine: 1.02 (ref 1.005–1.030)
UROBILINOGEN UA: 0.2 mg/dL (ref 0.0–1.0)
pH: 6 (ref 5.0–8.0)

## 2014-04-12 LAB — URINE MICROSCOPIC-ADD ON

## 2014-04-12 MED ORDER — LACTATED RINGERS IV BOLUS (SEPSIS)
1000.0000 mL | Freq: Once | INTRAVENOUS | Status: AC
Start: 2014-04-12 — End: 2014-04-12
  Administered 2014-04-12: 1000 mL via INTRAVENOUS

## 2014-04-12 MED ORDER — PROMETHAZINE HCL 25 MG/ML IJ SOLN
25.0000 mg | Freq: Once | INTRAMUSCULAR | Status: AC
  Administered 2014-04-12: 25 mg via INTRAVENOUS
  Filled 2014-04-12: qty 1

## 2014-04-12 MED ORDER — ONDANSETRON HCL 4 MG/2ML IJ SOLN
4.0000 mg | Freq: Once | INTRAMUSCULAR | Status: AC
  Administered 2014-04-12: 4 mg via INTRAVENOUS
  Filled 2014-04-12: qty 2

## 2014-04-12 MED ORDER — ONDANSETRON 8 MG PO TBDP: 8.0000 mg | ORAL_TABLET | Freq: Three times a day (TID) | ORAL | Status: DC | PRN

## 2014-04-12 NOTE — MAU Provider Note (Signed)
History     CSN: [id number]  Arrival date and time: 04/12/14 1200   None     Chief Complaint  Patient presents with  . Diarrhea  . Nausea  . Emesis   HPI  Ms KAELAN EMAMI is 18 y.o. female G2P0010 at [redacted]w[redacted]d who presents with complaints of ongoing diarrhea, nausea and vomiting. The patient was seen 3 days ago with contractions and complained of N/V/D then. I sent her home with phenergan and she states this is not helping. She is feeling the baby move, and denies bleeding.   OB History   Grav Para Term Preterm Abortions TAB SAB Ect Mult Living        Past Medical History  Diagnosis Date  . Medical history non-contributory   . Gonorrhea April 2015    Past Surgical History  Procedure Laterality Date  . Rhytidectomy neck / cheek / chin    . Hand surgery      Left  hand  . Hand surgery      left   . Chin surgery      History reviewed. No pertinent family history.  History  Substance Use Topics  . Smoking status: Never Smoker   . Smokeless tobacco: Never Used  . Alcohol Use: No    Allergies: No Known Allergies  Prescriptions prior to admission  Medication Sig Dispense Refill  . nitrofurantoin, macrocrystal-monohydrate, (MACROBID) 100 MG capsule Take 1 capsule (100 mg total) by mouth 2 (two) times daily.  10 capsule  0  . Prenatal Vit-Fe Fumarate-FA (PRENATAL VITAMIN PO) Take 1 tablet by mouth daily.      . promethazine (PHENERGAN) 25 MG tablet Take 1 tablet (25 mg total) by mouth every 6 (six) hours as needed for nausea or vomiting.  15 tablet  0   Results for orders placed during the hospital encounter of 04/12/14 (from the past 48 hour(s))  URINALYSIS, ROUTINE W REFLEX MICROSCOPIC     Status: Abnormal   Collection Time    04/12/14 12:09 PM      Result Value Ref Range   Color, Urine YELLOW  YELLOW   APPearance CLEAR  CLEAR   Specific Gravity, Urine 1.020  1.005 - 1.030   pH 6.0  5.0 - 8.0   Glucose, UA NEGATIVE  NEGATIVE mg/dL   Hgb  urine dipstick NEGATIVE  NEGATIVE   Bilirubin Urine NEGATIVE  NEGATIVE   Ketones, ur NEGATIVE  NEGATIVE mg/dL   Protein, ur NEGATIVE  NEGATIVE mg/dL   Urobilinogen, UA 0.2  0.0 - 1.0 mg/dL   Nitrite NEGATIVE  NEGATIVE   Leukocytes, UA MODERATE (*) NEGATIVE  URINE MICROSCOPIC-ADD ON     Status: Abnormal   Collection Time    04/12/14 12:09 PM      Result Value Ref Range   Squamous Epithelial / LPF FEW (*) RARE   WBC, UA 3-6  <3 WBC/hpf   Bacteria, UA RARE  RARE   Urine-Other MUCOUS PRESENT      Review of Systems  Constitutional: Negative for fever and chills.  Gastrointestinal: Positive for nausea, vomiting and diarrhea. Negative for abdominal pain and constipation.  Genitourinary: Positive for urgency and frequency. Negative for dysuria.   Physical Exam   Blood pressure 124/61, pulse 100, last menstrual period 09/06/2013.  Physical Exam  Constitutional: She is oriented to person, place, and time. She appears well-developed and well-nourished. No distress.  HENT:  Head: Normocephalic.  Eyes:  Pupils are equal, round, and reactive to light.  Neck: Neck supple.  Respiratory: Effort normal.  GI: Soft. She exhibits no distension. There is no tenderness. There is no rebound and no guarding.  Genitourinary:  Bimanual exam: Dilation: Closed, thick, posterior  Exam by:: J Janis Sol NP Chaperone present for exam.   Musculoskeletal: Normal range of motion.  Neurological: She is alert and oriented to person, place, and time.  Skin: She is not diaphoretic.  Psychiatric: Her behavior is normal.    Fetal Tracing: Baseline: 145 bpm  Variability: Moderate  Accelerations: 10x10 Decelerations: variable  Toco: occasional UI   MAU Course  Procedures None  MDM LR bolus Zofran  Phenergan  Patient is currently being treated for a UTI  Patient feels much better following fluids PO fluid challenge successful  Discussed fetal tracing and labs with Dr. Mora Appl   Assessment and Plan    A: 1. Gastroenteritis    P: Discharge home in stable condition BRAT diet RX: Zofran Call the office on Monday if symptoms have not improved Return to MAU if symptoms worsen Preterm labor precautions   Jeannifer Drakeford IRENE 04/12/2014, 1:12 PM

## 2014-04-12 NOTE — MAU Note (Signed)
Pt presents to MAU with complaints of nausea, vomiting, and diarrhea since Wednesday. Pt was evaluated in MAU with complaints of contractions on Wednesday.

## 2014-04-13 LAB — CULTURE, OB URINE
Colony Count: 50000
SPECIAL REQUESTS: NORMAL

## 2014-04-13 NOTE — MAU Provider Note (Signed)
Reviewed case with CNM and I agree with above  Nicole Mclean [name]  

## 2014-04-16 NOTE — MAU Provider Note (Signed)
I reviewed the above patient with Blanche EastJ Rasch, NP and agree with the plan of care.

## 2014-05-04 ENCOUNTER — Encounter (HOSPITAL_COMMUNITY): Payer: Self-pay | Admitting: *Deleted

## 2014-05-04 ENCOUNTER — Inpatient Hospital Stay (HOSPITAL_COMMUNITY)
Admission: AD | Admit: 2014-05-04 | Discharge: 2014-05-04 | Disposition: A | Discharge status: Home / Self Care | Payer: Medicaid Other | Source: Ambulatory Visit | Attending: Obstetrics and Gynecology | Admitting: Obstetrics and Gynecology

## 2014-05-04 DIAGNOSIS — R102 Pelvic and perineal pain: Secondary | ICD-10-CM

## 2014-05-04 DIAGNOSIS — Z3A34 34 weeks gestation of pregnancy: Secondary | ICD-10-CM | POA: Insufficient documentation

## 2014-05-04 DIAGNOSIS — O26899 Other specified pregnancy related conditions, unspecified trimester: Secondary | ICD-10-CM

## 2014-05-04 DIAGNOSIS — Z87891 Personal history of nicotine dependence: Secondary | ICD-10-CM | POA: Insufficient documentation

## 2014-05-04 DIAGNOSIS — O4703 False labor before 37 completed weeks of gestation, third trimester: Secondary | ICD-10-CM

## 2014-05-04 DIAGNOSIS — R109 Unspecified abdominal pain: Secondary | ICD-10-CM | POA: Diagnosis present

## 2014-05-04 LAB — URINALYSIS, ROUTINE W REFLEX MICROSCOPIC
Bilirubin Urine: NEGATIVE
Glucose, UA: NEGATIVE mg/dL
Hgb urine dipstick: NEGATIVE
Ketones, ur: NEGATIVE mg/dL
Nitrite: NEGATIVE
Protein, ur: NEGATIVE mg/dL
Specific Gravity, Urine: 1.025 (ref 1.005–1.030)
Urobilinogen, UA: 0.2 mg/dL (ref 0.0–1.0)
pH: 6 (ref 5.0–8.0)

## 2014-05-04 LAB — URINE MICROSCOPIC-ADD ON

## 2014-05-04 LAB — WET PREP, GENITAL
Trich, Wet Prep: NONE SEEN
Yeast Wet Prep HPF POC: NONE SEEN

## 2014-05-04 NOTE — MAU Provider Note (Signed)
History     CSN: [id number]  Arrival date and time: 05/04/14 1806   First Provider Initiated Contact with Patient 05/04/14 1859      No chief complaint on file.  HPI Nicole Mclean is a 18 y.o. G2P0010 at 2275w2d. She presents with 3 d hx of abd pain. It starts bil sides and radiates to ML and down to groin and to back. Is intense, makes her stop doing whatever she is doing, feels nauseated. No bleeding or leaking. No change in discharge, odor or itching. No urinary frequency, urgency or dysuria. Nl BM this am. + FM.  Same partner x 3 yr.  Next appt 10/29.  OB History   Grav Para Term Preterm Abortions TAB SAB Ect Mult Living   2 0 0 0 1 0 1 0 0 0       Past Medical History  Diagnosis Date  . Medical history non-contributory   . Gonorrhea April 2015    Past Surgical History  Procedure Laterality Date  . Rhytidectomy neck / cheek / chin    . Hand surgery      Left  hand  . Hand surgery      left   . Chin surgery      History reviewed. No pertinent family history.  History  Substance Use Topics  . Smoking status: Former Smoker    Types: Cigars    Quit date: 08/04/2013  . Smokeless tobacco: Never Used  . Alcohol Use: No    Allergies: No Known Allergies  Prescriptions prior to admission  Medication Sig Dispense Refill  . ondansetron (ZOFRAN ODT) 8 MG disintegrating tablet Take 1 tablet (8 mg total) by mouth every 8 (eight) hours as needed for nausea or vomiting.  15 tablet  0  . Prenatal Vit-Fe Fumarate-FA (PRENATAL VITAMIN PO) Take 1 tablet by mouth daily.      . promethazine (PHENERGAN) 25 MG tablet Take 1 tablet (25 mg total) by mouth every 6 (six) hours as needed for nausea or vomiting.  15 tablet  0  . nitrofurantoin, macrocrystal-monohydrate, (MACROBID) 100 MG capsule Take 1 capsule (100 mg total) by mouth 2 (two) times daily.  10 capsule  0    Review of Systems  Constitutional: Negative for fever and chills.  Gastrointestinal: Positive for abdominal pain.  Negative for diarrhea and constipation.  Genitourinary: Negative for dysuria, urgency and frequency.       Denies discharge, leaking or bleeding   Physical Exam   Blood pressure 117/66, pulse 96, temperature 97.6 F (36.4 C), temperature source Oral, resp. rate 16, height 5\' 1"  (1.549 m), weight 94.802 kg (209 lb), last menstrual period 09/06/2013.  Physical Exam  Nursing note and vitals reviewed. Constitutional: She is oriented to person, place, and time. She appears well-developed and well-nourished.  GI: Soft. There is no tenderness.  Gravid  Genitourinary:  Pelvic exam: Ext gen- nl anatomy, skin intact Vagina- small amt thick white discharge Cx-external os 1cm, internal os closed, 40% , posterior Uterus- gravid, non tender Adn- non tender  Musculoskeletal: Normal range of motion.  Neurological: She is alert and oriented to person, place, and time.  Skin: Skin is warm and dry.  Psychiatric: She has a normal mood and affect. Her behavior is normal.    MAU Course  Procedures  MDM  Reactive strip, uterine irritability with irreg contractions initially. Pt was hydrated, voided, no contractions. She works 5-7 hr shifts at [employer]- hard to drink and go the BR as  needed  Results for orders placed during the hospital encounter of 05/04/14 (from the past 24 hour(s))  URINALYSIS, ROUTINE W REFLEX MICROSCOPIC     Status: Abnormal   Collection Time    05/04/14  7:20 PM      Result Value Ref Range   Color, Urine YELLOW  YELLOW   APPearance CLEAR  CLEAR   Specific Gravity, Urine 1.025  1.005 - 1.030   pH 6.0  5.0 - 8.0   Glucose, UA NEGATIVE  NEGATIVE mg/dL   Hgb urine dipstick NEGATIVE  NEGATIVE   Bilirubin Urine NEGATIVE  NEGATIVE   Ketones, ur NEGATIVE  NEGATIVE mg/dL   Protein, ur NEGATIVE  NEGATIVE mg/dL   Urobilinogen, UA 0.2  0.0 - 1.0 mg/dL   Nitrite NEGATIVE  NEGATIVE   Leukocytes, UA TRACE (*) NEGATIVE  URINE MICROSCOPIC-ADD ON     Status: Abnormal   Collection Time     05/04/14  7:20 PM      Result Value Ref Range   Squamous Epithelial / LPF FEW (*) RARE   WBC, UA 0-2  <3 WBC/hpf   RBC / HPF 0-2  <3 RBC/hpf   Bacteria, UA FEW (*) RARE  WET PREP, GENITAL     Status: Abnormal   Collection Time    05/04/14  7:30 PM      Result Value Ref Range   Yeast Wet Prep HPF POC NONE SEEN  NONE SEEN   Trich, Wet Prep NONE SEEN  NONE SEEN   Clue Cells Wet Prep HPF POC FEW (*) NONE SEEN   WBC, Wet Prep HPF POC MODERATE (*) NONE SEEN     Assessment and Plan  Urine & wet prep neg, GC/CT pending Reactive strip Cx closed, thick Occ contractions ? Ligament pain  Increase fluids Precautions reviewed Call the office to be seen this week if symptoms continue  Donyale Berthold M. 05/04/2014, 7:04 PM

## 2014-05-04 NOTE — MAU Note (Signed)
Pt c/o sore lower abd for the past three days describing a sharp, shooting pain that comes irregularly btwn 5-13 min apart and lasts for a few seconds.  It doubles her over and then quickly goes away.  Worsens while she is standing and /or lifting items at work.  Denies any leaking or vag bleeding.  Reports good fetal movement.

## 2014-05-05 LAB — GC/CHLAMYDIA PROBE AMP
CT Probe RNA: NEGATIVE
GC Probe RNA: NEGATIVE

## 2014-05-15 ENCOUNTER — Other Ambulatory Visit: Payer: Self-pay | Admitting: Obstetrics

## 2014-05-19 ENCOUNTER — Encounter (HOSPITAL_COMMUNITY): Payer: Self-pay | Admitting: *Deleted

## 2014-05-27 ENCOUNTER — Encounter (HOSPITAL_COMMUNITY): Payer: Self-pay | Admitting: *Deleted

## 2014-05-27 ENCOUNTER — Inpatient Hospital Stay (HOSPITAL_COMMUNITY)
Admission: AD | Admit: 2014-05-27 | Discharge: 2014-05-27 | Disposition: A | Discharge status: Home / Self Care | Payer: Medicaid Other | Source: Ambulatory Visit | Attending: Obstetrics and Gynecology | Admitting: Obstetrics and Gynecology

## 2014-05-27 DIAGNOSIS — R1030 Lower abdominal pain, unspecified: Secondary | ICD-10-CM | POA: Insufficient documentation

## 2014-05-27 DIAGNOSIS — O9989 Other specified diseases and conditions complicating pregnancy, childbirth and the puerperium: Secondary | ICD-10-CM | POA: Insufficient documentation

## 2014-05-27 DIAGNOSIS — M545 Low back pain: Secondary | ICD-10-CM | POA: Diagnosis present

## 2014-05-27 DIAGNOSIS — N898 Other specified noninflammatory disorders of vagina: Secondary | ICD-10-CM | POA: Diagnosis not present

## 2014-05-27 DIAGNOSIS — Z3A39 39 weeks gestation of pregnancy: Secondary | ICD-10-CM | POA: Diagnosis not present

## 2014-05-27 NOTE — Discharge Instructions (Signed)
Fetal Movement Counts °Patient Name: __________________________________________________ Patient Due Date: ____________________ °Performing a fetal movement count is highly recommended in high-risk pregnancies, but it is good for every pregnant woman to do. Your health care provider may ask you to start counting fetal movements at 28 weeks of the pregnancy. Fetal movements often increase: °· After eating a full meal. °· After physical activity. °· After eating or drinking something sweet or cold. °· At rest. °Pay attention to when you feel the baby is most active. This will help you notice a pattern of your baby's sleep and wake cycles and what factors contribute to an increase in fetal movement. It is important to perform a fetal movement count at the same time each day when your baby is normally most active.  °HOW TO COUNT FETAL MOVEMENTS °1. Find a quiet and comfortable area to sit or lie down on your left side. Lying on your left side provides the best blood and oxygen circulation to your baby. °2. Write down the day and time on a sheet of paper or in a journal. °3. Start counting kicks, flutters, swishes, rolls, or jabs in a 2-hour period. You should feel at least 10 movements within 2 hours. °4. If you do not feel 10 movements in 2 hours, wait 2-3 hours and count again. Look for a change in the pattern or not enough counts in 2 hours. °SEEK MEDICAL CARE IF: °· You feel less than 10 counts in 2 hours, tried twice. °· There is no movement in over an hour. °· The pattern is changing or taking longer each day to reach 10 counts in 2 hours. °· You feel the baby is not moving as he or she usually does. °Date: ____________ Movements: ____________ Start time: ____________ Finish time: ____________  °Date: ____________ Movements: ____________ Start time: ____________ Finish time: ____________ °Date: ____________ Movements: ____________ Start time: ____________ Finish time: ____________ °Date: ____________ Movements:  ____________ Start time: ____________ Finish time: ____________ °Date: ____________ Movements: ____________ Start time: ____________ Finish time: ____________ °Date: ____________ Movements: ____________ Start time: ____________ Finish time: ____________ °Date: ____________ Movements: ____________ Start time: ____________ Finish time: ____________ °Date: ____________ Movements: ____________ Start time: ____________ Finish time: ____________  °Date: ____________ Movements: ____________ Start time: ____________ Finish time: ____________ °Date: ____________ Movements: ____________ Start time: ____________ Finish time: ____________ °Date: ____________ Movements: ____________ Start time: ____________ Finish time: ____________ °Date: ____________ Movements: ____________ Start time: ____________ Finish time: ____________ °Date: ____________ Movements: ____________ Start time: ____________ Finish time: ____________ °Date: ____________ Movements: ____________ Start time: ____________ Finish time: ____________ °Date: ____________ Movements: ____________ Start time: ____________ Finish time: ____________  °Date: ____________ Movements: ____________ Start time: ____________ Finish time: ____________ °Date: ____________ Movements: ____________ Start time: ____________ Finish time: ____________ °Date: ____________ Movements: ____________ Start time: ____________ Finish time: ____________ °Date: ____________ Movements: ____________ Start time: ____________ Finish time: ____________ °Date: ____________ Movements: ____________ Start time: ____________ Finish time: ____________ °Date: ____________ Movements: ____________ Start time: ____________ Finish time: ____________ °Date: ____________ Movements: ____________ Start time: ____________ Finish time: ____________  °Date: ____________ Movements: ____________ Start time: ____________ Finish time: ____________ °Date: ____________ Movements: ____________ Start time: ____________ Finish  time: ____________ °Date: ____________ Movements: ____________ Start time: ____________ Finish time: ____________ °Date: ____________ Movements: ____________ Start time: ____________ Finish time: ____________ °Date: ____________ Movements: ____________ Start time: ____________ Finish time: ____________ °Date: ____________ Movements: ____________ Start time: ____________ Finish time: ____________ °Date: ____________ Movements: ____________ Start time: ____________ Finish time: ____________  °Date: ____________ Movements: ____________ Start time: ____________ Finish   time: ____________ °Date: ____________ Movements: ____________ Start time: ____________ Finish time: ____________ °Date: ____________ Movements: ____________ Start time: ____________ Finish time: ____________ °Date: ____________ Movements: ____________ Start time: ____________ Finish time: ____________ °Date: ____________ Movements: ____________ Start time: ____________ Finish time: ____________ °Date: ____________ Movements: ____________ Start time: ____________ Finish time: ____________ °Date: ____________ Movements: ____________ Start time: ____________ Finish time: ____________  °Date: ____________ Movements: ____________ Start time: ____________ Finish time: ____________ °Date: ____________ Movements: ____________ Start time: ____________ Finish time: ____________ °Date: ____________ Movements: ____________ Start time: ____________ Finish time: ____________ °Date: ____________ Movements: ____________ Start time: ____________ Finish time: ____________ °Date: ____________ Movements: ____________ Start time: ____________ Finish time: ____________ °Date: ____________ Movements: ____________ Start time: ____________ Finish time: ____________ °Date: ____________ Movements: ____________ Start time: ____________ Finish time: ____________  °Date: ____________ Movements: ____________ Start time: ____________ Finish time: ____________ °Date: ____________  Movements: ____________ Start time: ____________ Finish time: ____________ °Date: ____________ Movements: ____________ Start time: ____________ Finish time: ____________ °Date: ____________ Movements: ____________ Start time: ____________ Finish time: ____________ °Date: ____________ Movements: ____________ Start time: ____________ Finish time: ____________ °Date: ____________ Movements: ____________ Start time: ____________ Finish time: ____________ °Date: ____________ Movements: ____________ Start time: ____________ Finish time: ____________  °Date: ____________ Movements: ____________ Start time: ____________ Finish time: ____________ °Date: ____________ Movements: ____________ Start time: ____________ Finish time: ____________ °Date: ____________ Movements: ____________ Start time: ____________ Finish time: ____________ °Date: ____________ Movements: ____________ Start time: ____________ Finish time: ____________ °Date: ____________ Movements: ____________ Start time: ____________ Finish time: ____________ °Date: ____________ Movements: ____________ Start time: ____________ Finish time: ____________ °Document Released: 08/03/2006 Document Revised: 11/18/2013 Document Reviewed: 04/30/2012 °[employer]® Patient Information ©2015 [employer]. This information is not intended to replace advice given to you by your health care provider. Make sure you discuss any questions you have with your health care provider. °Vaginal Delivery °During delivery, your health care provider will help you give birth to your baby. During a vaginal delivery, you will work to push the baby out of your vagina. However, before you can push your baby out, a few things need to happen. The opening of your uterus (cervix) has to soften, thin out, and open up (dilate) all the way to 10 cm. Also, your baby has to move down from the uterus into your vagina.  °SIGNS OF LABOR  °Your health care provider will first need to make sure you are in labor.  Signs of labor include:  °· Passing what is called the mucous plug before labor begins. This is a small amount of blood-stained mucus. °· Having regular, painful uterine contractions.   °· The time between contractions gets shorter.   °· The discomfort and pain gradually get more intense. °· Contraction pains get worse when walking and do not go away when resting.   °· Your cervix becomes thinner (effacement) and dilates. °BEFORE THE DELIVERY °Once you are in labor and admitted into the hospital or care center, your health care provider may do the following:  °5. Perform a complete physical exam. °6. Review any complications related to pregnancy or labor.  °7. Check your blood pressure, pulse, temperature, and heart rate (vital signs).   °8. Determine if, and when, the rupture of amniotic membranes occurred. °9. Do a vaginal exam (using a sterile glove and lubricant) to determine:   °1. The position (presentation) of the baby. Is the baby's head presenting first (vertex) in the birth canal (vagina), or are the feet or buttocks first (breech)?   °2. The level (station) of the baby's head within the birth canal.   °3.   The effacement and dilatation of the cervix.   °10. An electronic fetal monitor is usually placed on your abdomen when you first arrive. This is used to monitor your contractions and the baby's heart rate. °1. When the monitor is on your abdomen (external fetal monitor), it can only pick up the frequency and length of your contractions. It cannot tell the strength of your contractions. °2. If it becomes necessary for your health care provider to know exactly how strong your contractions are or to see exactly what the baby's heart rate is doing, an internal monitor may be inserted into your vagina and uterus. Your health care provider will discuss the benefits and risks of using an internal monitor and obtain your permission before inserting the device. °3. Continuous fetal monitoring may be needed if you  have an epidural, are receiving certain medicines (such as oxytocin), or have pregnancy or labor complications. °11. An IV access tube may be placed into a vein in your arm to deliver fluids and medicines if necessary. °THREE STAGES OF LABOR AND DELIVERY °Normal labor and delivery is divided into three stages. °First Stage °This stage starts when you begin to contract regularly and your cervix begins to efface and dilate. It ends when your cervix is completely open (fully dilated). The first stage is the longest stage of labor and can last from 3 hours to 15 hours.  °Several methods are available to help with labor pain. You and your health care provider will decide which option is best for you. Options include:  °· Opioid medicines. These are strong pain medicines that you can get through your IV tube or as a shot into your muscle. These medicines lessen pain but do not make it go away completely.  °· Epidural. A medicine is given through a thin tube that is inserted in your back. The medicine numbs the lower part of your body and prevents any pain in that area. °· Paracervical pain medicine. This is an injection of an anesthetic on each side of your cervix.   °· You may request natural childbirth, which does not involve the use of pain medicines or an epidural during labor and delivery. Instead, you will use other things, such as breathing exercises, to help cope with the pain. °Second Stage °The second stage of labor begins when your cervix is fully dilated at 10 cm. It continues until you push your baby down through the birth canal and the baby is born. This stage can take only minutes or several hours. °· The location of your baby's head as it moves through the birth canal is reported as a number called a station. If the baby's head has not started its descent, the station is described as being at minus 3 (-3). When your baby's head is at the zero station, it is at the middle of the birth canal and is engaged  in the pelvis. The station of your baby helps indicate the progress of the second stage of labor. °· When your baby is born, your health care provider may hold the baby with his or her head lowered to prevent amniotic fluid, mucus, and blood from getting into the baby's lungs. The baby's mouth and nose may be suctioned with a small bulb syringe to remove any additional fluid. °· Your health care provider may then place the baby on your stomach. It is important to keep the baby from getting cold. To do this, the health care provider will dry the baby off, place the   baby directly on your skin (with no blankets between you and the baby), and cover the baby with warm, dry blankets.   °· The umbilical cord is cut. °Third Stage °During the third stage of labor, your health care provider will deliver the placenta (afterbirth) and make sure your bleeding is under control. The delivery of the placenta usually takes about 5 minutes but can take up to 30 minutes. After the placenta is delivered, a medicine may be given either by IV or injection to help contract the uterus and control bleeding. If you are planning to breastfeed, you can try to do so now. °After you deliver the placenta, your uterus should contract and get very firm. If your uterus does not remain firm, your health care provider will massage it. This is important because the contraction of the uterus helps cut off bleeding at the site where the placenta was attached to your uterus. If your uterus does not contract properly and stay firm, you may continue to bleed heavily. If there is a lot of bleeding, medicines may be given to contract the uterus and stop the bleeding.  °Document Released: 04/12/2008 Document Revised: 11/18/2013 Document Reviewed: 12/23/2012 °[employer]® Patient Information ©2015 [employer]. This information is not intended to replace advice given to you by your health care provider. Make sure you discuss any questions you have with your  health care provider. ° °

## 2014-05-27 NOTE — Progress Notes (Signed)
Left message with Dr Dareen PianoAnderson to call MAU in regards to pt concerns

## 2014-05-27 NOTE — MAU Note (Signed)
Patient states she has been having low back pain and lower abdominal pain  But not sure if contractions. Has a slight pink vaginal discharge. Reports good fetal movement.

## 2014-05-27 NOTE — MAU Note (Signed)
Urine in lab 

## 2014-05-27 NOTE — Progress Notes (Signed)
Spoke with Dr Dareen PianoAnderson pts, VE, FHR pattern and contraction pattern orders given to discharge home

## 2014-06-04 ENCOUNTER — Encounter (HOSPITAL_COMMUNITY): Payer: Self-pay

## 2014-06-04 ENCOUNTER — Inpatient Hospital Stay (HOSPITAL_COMMUNITY)
Admission: AD | Admit: 2014-06-04 | Discharge: 2014-06-04 | Disposition: A | Discharge status: Home / Self Care | Payer: Medicaid Other | Source: Ambulatory Visit | Attending: Obstetrics and Gynecology | Admitting: Obstetrics and Gynecology

## 2014-06-04 DIAGNOSIS — Z3A38 38 weeks gestation of pregnancy: Secondary | ICD-10-CM | POA: Insufficient documentation

## 2014-06-04 DIAGNOSIS — O471 False labor at or after 37 completed weeks of gestation: Secondary | ICD-10-CM | POA: Diagnosis present

## 2014-06-04 NOTE — Progress Notes (Signed)
Notified of pt arrival in MAU, cervical exam, uterine activity and FHR tracing. Will give pt option to walk and recheck or go home

## 2014-06-04 NOTE — Discharge Instructions (Signed)
Third Trimester of Pregnancy °The third trimester is from week 29 through week 42, months 7 through 9. The third trimester is a time when the fetus is growing rapidly. At the end of the ninth month, the fetus is about 20 inches in length and weighs 6-10 pounds.  °BODY CHANGES °Your body goes through many changes during pregnancy. The changes vary from woman to woman.  °· Your weight will continue to increase. You can expect to gain 25-35 pounds (11-16 kg) by the end of the pregnancy. °· You may begin to get stretch marks on your hips, abdomen, and breasts. °· You may urinate more often because the fetus is moving lower into your pelvis and pressing on your bladder. °· You may develop or continue to have heartburn as a result of your pregnancy. °· You may develop constipation because certain hormones are causing the muscles that push waste through your intestines to slow down. °· You may develop hemorrhoids or swollen, bulging veins (varicose veins). °· You may have pelvic pain because of the weight gain and pregnancy hormones relaxing your joints between the bones in your pelvis. Backaches may result from overexertion of the muscles supporting your posture. °· You may have changes in your hair. These can include thickening of your hair, rapid growth, and changes in texture. Some women also have hair loss during or after pregnancy, or hair that feels dry or thin. Your hair will most likely return to normal after your baby is born. °· Your breasts will continue to grow and be tender. A yellow discharge may leak from your breasts called colostrum. °· Your belly button may stick out. °· You may feel short of breath because of your expanding uterus. °· You may notice the fetus "dropping," or moving lower in your abdomen. °· You may have a bloody mucus discharge. This usually occurs a few days to a week before labor begins. °· Your cervix becomes thin and soft (effaced) near your due date. °WHAT TO EXPECT AT YOUR PRENATAL  EXAMS  °You will have prenatal exams every 2 weeks until week 36. Then, you will have weekly prenatal exams. During a routine prenatal visit: °· You will be weighed to make sure you and the fetus are growing normally. °· Your blood pressure is taken. °· Your abdomen will be measured to track your baby's growth. °· The fetal heartbeat will be listened to. °· Any test results from the previous visit will be discussed. °· You may have a cervical check near your due date to see if you have effaced. °At around 36 weeks, your caregiver will check your cervix. At the same time, your caregiver will also perform a test on the secretions of the vaginal tissue. This test is to determine if a type of bacteria, Group B streptococcus, is present. Your caregiver will explain this further. °Your caregiver may ask you: °· What your birth plan is. °· How you are feeling. °· If you are feeling the baby move. °· If you have had any abnormal symptoms, such as leaking fluid, bleeding, severe headaches, or abdominal cramping. °· If you have any questions. °Other tests or screenings that may be performed during your third trimester include: °· Blood tests that check for low iron levels (anemia). °· Fetal testing to check the health, activity level, and growth of the fetus. Testing is done if you have certain medical conditions or if there are problems during the pregnancy. °FALSE LABOR °You may feel small, irregular contractions that   eventually go away. These are called Braxton Hicks contractions, or false labor. Contractions may last for hours, days, or even weeks before true labor sets in. If contractions come at regular intervals, intensify, or become painful, it is best to be seen by your caregiver.  °SIGNS OF LABOR  °· Menstrual-like cramps. °· Contractions that are 5 minutes apart or less. °· Contractions that start on the top of the uterus and spread down to the lower abdomen and back. °· A sense of increased pelvic pressure or back  pain. °· A watery or bloody mucus discharge that comes from the vagina. °If you have any of these signs before the 37th week of pregnancy, call your caregiver right away. You need to go to the hospital to get checked immediately. °HOME CARE INSTRUCTIONS  °· Avoid all smoking, herbs, alcohol, and unprescribed drugs. These chemicals affect the formation and growth of the baby. °· Follow your caregiver's instructions regarding medicine use. There are medicines that are either safe or unsafe to take during pregnancy. °· Exercise only as directed by your caregiver. Experiencing uterine cramps is a good sign to stop exercising. °· Continue to eat regular, healthy meals. °· Wear a good support bra for breast tenderness. °· Do not use hot tubs, steam rooms, or saunas. °· Wear your seat belt at all times when driving. °· Avoid raw meat, uncooked cheese, cat litter boxes, and soil used by cats. These carry germs that can cause birth defects in the baby. °· Take your prenatal vitamins. °· Try taking a stool softener (if your caregiver approves) if you develop constipation. Eat more high-fiber foods, such as fresh vegetables or fruit and whole grains. Drink plenty of fluids to keep your urine clear or pale yellow. °· Take warm sitz baths to soothe any pain or discomfort caused by hemorrhoids. Use hemorrhoid cream if your caregiver approves. °· If you develop varicose veins, wear support hose. Elevate your feet for 15 minutes, 3-4 times a day. Limit salt in your diet. °· Avoid heavy lifting, wear low heal shoes, and practice good posture. °· Rest a lot with your legs elevated if you have leg cramps or low back pain. °· Visit your dentist if you have not gone during your pregnancy. Use a soft toothbrush to brush your teeth and be gentle when you floss. °· A sexual relationship may be continued unless your caregiver directs you otherwise. °· Do not travel far distances unless it is absolutely necessary and only with the approval  of your caregiver. °· Take prenatal classes to understand, practice, and ask questions about the labor and delivery. °· Make a trial run to the hospital. °· Pack your hospital bag. °· Prepare the baby's nursery. °· Continue to go to all your prenatal visits as directed by your caregiver. °SEEK MEDICAL CARE IF: °· You are unsure if you are in labor or if your water has broken. °· You have dizziness. °· You have mild pelvic cramps, pelvic pressure, or nagging pain in your abdominal area. °· You have persistent nausea, vomiting, or diarrhea. °· You have a bad smelling vaginal discharge. °· You have pain with urination. °SEEK IMMEDIATE MEDICAL CARE IF:  °· You have a fever. °· You are leaking fluid from your vagina. °· You have spotting or bleeding from your vagina. °· You have severe abdominal cramping or pain. °· You have rapid weight loss or gain. °· You have shortness of breath with chest pain. °· You notice sudden or extreme swelling   of your face, hands, ankles, feet, or legs. °· You have not felt your baby move in over an hour. °· You have severe headaches that do not go away with medicine. °· You have vision changes. °Document Released: 06/28/2001 Document Revised: 07/09/2013 Document Reviewed: 09/04/2012 °[employer]® Patient Information ©2015 [employer]. This information is not intended to replace advice given to you by your health care provider. Make sure you discuss any questions you have with your health care provider. °Fetal Movement Counts °Patient Name: __________________________________________________ Patient Due Date: ____________________ °Performing a fetal movement count is highly recommended in high-risk pregnancies, but it is good for every pregnant woman to do. Your health care provider may ask you to start counting fetal movements at 28 weeks of the pregnancy. Fetal movements often increase: °· After eating a full meal. °· After physical activity. °· After eating or drinking something sweet or  cold. °· At rest. °Pay attention to when you feel the baby is most active. This will help you notice a pattern of your baby's sleep and wake cycles and what factors contribute to an increase in fetal movement. It is important to perform a fetal movement count at the same time each day when your baby is normally most active.  °HOW TO COUNT FETAL MOVEMENTS °· Find a quiet and comfortable area to sit or lie down on your left side. Lying on your left side provides the best blood and oxygen circulation to your baby. °· Write down the day and time on a sheet of paper or in a journal. °· Start counting kicks, flutters, swishes, rolls, or jabs in a 2-hour period. You should feel at least 10 movements within 2 hours. °· If you do not feel 10 movements in 2 hours, wait 2-3 hours and count again. Look for a change in the pattern or not enough counts in 2 hours. °SEEK MEDICAL CARE IF: °· You feel less than 10 counts in 2 hours, tried twice. °· There is no movement in over an hour. °· The pattern is changing or taking longer each day to reach 10 counts in 2 hours. °· You feel the baby is not moving as he or she usually does. °Date: ____________ Movements: ____________ Start time: ____________ Finish time: ____________  °Date: ____________ Movements: ____________ Start time: ____________ Finish time: ____________ °Date: ____________ Movements: ____________ Start time: ____________ Finish time: ____________ °Date: ____________ Movements: ____________ Start time: ____________ Finish time: ____________ °Date: ____________ Movements: ____________ Start time: ____________ Finish time: ____________ °Date: ____________ Movements: ____________ Start time: ____________ Finish time: ____________ °Date: ____________ Movements: ____________ Start time: ____________ Finish time: ____________ °Date: ____________ Movements: ____________ Start time: ____________ Finish time: ____________  °Date: ____________ Movements: ____________ Start time:  ____________ Finish time: ____________ °Date: ____________ Movements: ____________ Start time: ____________ Finish time: ____________ °Date: ____________ Movements: ____________ Start time: ____________ Finish time: ____________ °Date: ____________ Movements: ____________ Start time: ____________ Finish time: ____________ °Date: ____________ Movements: ____________ Start time: ____________ Finish time: ____________ °Date: ____________ Movements: ____________ Start time: ____________ Finish time: ____________ °Date: ____________ Movements: ____________ Start time: ____________ Finish time: ____________  °Date: ____________ Movements: ____________ Start time: ____________ Finish time: ____________ °Date: ____________ Movements: ____________ Start time: ____________ Finish time: ____________ °Date: ____________ Movements: ____________ Start time: ____________ Finish time: ____________ °Date: ____________ Movements: ____________ Start time: ____________ Finish time: ____________ °Date: ____________ Movements: ____________ Start time: ____________ Finish time: ____________ °Date: ____________ Movements: ____________ Start time: ____________ Finish time: ____________ °Date: ____________ Movements: ____________ Start time: ____________ Finish time:   ____________  °Date: ____________ Movements: ____________ Start time: ____________ Finish time: ____________ °Date: ____________ Movements: ____________ Start time: ____________ Finish time: ____________ °Date: ____________ Movements: ____________ Start time: ____________ Finish time: ____________ °Date: ____________ Movements: ____________ Start time: ____________ Finish time: ____________ °Date: ____________ Movements: ____________ Start time: ____________ Finish time: ____________ °Date: ____________ Movements: ____________ Start time: ____________ Finish time: ____________ °Date: ____________ Movements: ____________ Start time: ____________ Finish time: ____________  °Date:  ____________ Movements: ____________ Start time: ____________ Finish time: ____________ °Date: ____________ Movements: ____________ Start time: ____________ Finish time: ____________ °Date: ____________ Movements: ____________ Start time: ____________ Finish time: ____________ °Date: ____________ Movements: ____________ Start time: ____________ Finish time: ____________ °Date: ____________ Movements: ____________ Start time: ____________ Finish time: ____________ °Date: ____________ Movements: ____________ Start time: ____________ Finish time: ____________ °Date: ____________ Movements: ____________ Start time: ____________ Finish time: ____________  °Date: ____________ Movements: ____________ Start time: ____________ Finish time: ____________ °Date: ____________ Movements: ____________ Start time: ____________ Finish time: ____________ °Date: ____________ Movements: ____________ Start time: ____________ Finish time: ____________ °Date: ____________ Movements: ____________ Start time: ____________ Finish time: ____________ °Date: ____________ Movements: ____________ Start time: ____________ Finish time: ____________ °Date: ____________ Movements: ____________ Start time: ____________ Finish time: ____________ °Date: ____________ Movements: ____________ Start time: ____________ Finish time: ____________  °Date: ____________ Movements: ____________ Start time: ____________ Finish time: ____________ °Date: ____________ Movements: ____________ Start time: ____________ Finish time: ____________ °Date: ____________ Movements: ____________ Start time: ____________ Finish time: ____________ °Date: ____________ Movements: ____________ Start time: ____________ Finish time: ____________ °Date: ____________ Movements: ____________ Start time: ____________ Finish time: ____________ °Date: ____________ Movements: ____________ Start time: ____________ Finish time: ____________ °Date: ____________ Movements: ____________ Start  time: ____________ Finish time: ____________  °Date: ____________ Movements: ____________ Start time: ____________ Finish time: ____________ °Date: ____________ Movements: ____________ Start time: ____________ Finish time: ____________ °Date: ____________ Movements: ____________ Start time: ____________ Finish time: ____________ °Date: ____________ Movements: ____________ Start time: ____________ Finish time: ____________ °Date: ____________ Movements: ____________ Start time: ____________ Finish time: ____________ °Date: ____________ Movements: ____________ Start time: ____________ Finish time: ____________ °Document Released: 08/03/2006 Document Revised: 11/18/2013 Document Reviewed: 04/30/2012 °[employer]® Patient Information ©2015 [employer]. This information is not intended to replace advice given to you by your health care provider. Make sure you discuss any questions you have with your health care provider. °Braxton Hicks Contractions °Contractions of the uterus can occur throughout pregnancy. Contractions are not always a sign that you are in labor.  °WHAT ARE BRAXTON HICKS CONTRACTIONS?  °Contractions that occur before labor are called Braxton Hicks contractions, or false labor. Toward the end of pregnancy (32-34 weeks), these contractions can develop more often and may become more forceful. This is not true labor because these contractions do not result in opening (dilatation) and thinning of the cervix. They are sometimes difficult to tell apart from true labor because these contractions can be forceful and people have different pain tolerances. You should not feel embarrassed if you go to the hospital with false labor. Sometimes, the only way to tell if you are in true labor is for your health care provider to look for changes in the cervix. °If there are no prenatal problems or other health problems associated with the pregnancy, it is completely safe to be sent home with false labor and await the  onset of true labor. °HOW CAN YOU TELL THE DIFFERENCE BETWEEN TRUE AND FALSE LABOR? °False Labor °· The contractions of false labor are usually shorter and not as hard as those of true labor.   °· The contractions   are usually irregular.   °· The contractions are often felt in the front of the lower abdomen and in the groin.   °· The contractions may go away when you walk around or change positions while lying down.   °· The contractions get weaker and are shorter lasting as time goes on.   °· The contractions do not usually become progressively stronger, regular, and closer together as with true labor.   °True Labor °· Contractions in true labor last 30-70 seconds, become very regular, usually become more intense, and increase in frequency.   °· The contractions do not go away with walking.   °· The discomfort is usually felt in the top of the uterus and spreads to the lower abdomen and low back.   °· True labor can be determined by your health care provider with an exam. This will show that the cervix is dilating and getting thinner.   °WHAT TO REMEMBER °· Keep up with your usual exercises and follow other instructions given by your health care provider.   °· Take medicines as directed by your health care provider.   °· Keep your regular prenatal appointments.   °· Eat and drink lightly if you think you are going into labor.   °· If Braxton Hicks contractions are making you uncomfortable:   °· Change your position from lying down or resting to walking, or from walking to resting.   °· Sit and rest in a tub of warm water.   °· Drink 2-3 glasses of water. Dehydration may cause these contractions.   °· Do slow and deep breathing several times an hour.   °WHEN SHOULD I SEEK IMMEDIATE MEDICAL CARE? °Seek immediate medical care if: °· Your contractions become stronger, more regular, and closer together.   °· You have fluid leaking or gushing from your vagina.   °· You have a fever.   °· You pass blood-tinged mucus.    °· You have vaginal bleeding.   °· You have continuous abdominal pain.   °· You have low back pain that you never had before.   °· You feel your baby's head pushing down and causing pelvic pressure.   °· Your baby is not moving as much as it used to.   °Document Released: 07/04/2005 Document Revised: 07/09/2013 Document Reviewed: 04/15/2013 °[employer]® Patient Information ©2015 [employer]. This information is not intended to replace advice given to you by your health care provider. Make sure you discuss any questions you have with your health care provider. ° °

## 2014-06-04 NOTE — MAU Note (Signed)
Pt reports ?contractions, had MD appointment today and has been having pain since she left there. SVE 3

## 2014-06-06 ENCOUNTER — Inpatient Hospital Stay (HOSPITAL_COMMUNITY): Payer: Medicaid Other | Admitting: Anesthesiology

## 2014-06-06 ENCOUNTER — Encounter (HOSPITAL_COMMUNITY): Payer: Self-pay

## 2014-06-06 ENCOUNTER — Inpatient Hospital Stay (HOSPITAL_COMMUNITY)
Admission: AD | Admit: 2014-06-06 | Discharge: 2014-06-08 | DRG: 775 | Disposition: A | Discharge status: Home / Self Care | Payer: Medicaid Other | Source: Intra-hospital | Attending: Obstetrics and Gynecology | Admitting: Obstetrics and Gynecology

## 2014-06-06 DIAGNOSIS — Z3A39 39 weeks gestation of pregnancy: Secondary | ICD-10-CM | POA: Diagnosis present

## 2014-06-06 DIAGNOSIS — Z3480 Encounter for supervision of other normal pregnancy, unspecified trimester: Secondary | ICD-10-CM

## 2014-06-06 DIAGNOSIS — O4290 Premature rupture of membranes, unspecified as to length of time between rupture and onset of labor, unspecified weeks of gestation: Secondary | ICD-10-CM | POA: Diagnosis present

## 2014-06-06 DIAGNOSIS — Z3483 Encounter for supervision of other normal pregnancy, third trimester: Secondary | ICD-10-CM | POA: Diagnosis present

## 2014-06-06 LAB — CBC
HCT: 36.9 % (ref 36.0–46.0)
HEMOGLOBIN: 12.8 g/dL (ref 12.0–15.0)
MCH: 31.7 pg (ref 26.0–34.0)
MCHC: 34.7 g/dL (ref 30.0–36.0)
MCV: 91.3 fL (ref 78.0–100.0)
Platelets: 242 10*3/uL (ref 150–400)
RBC: 4.04 MIL/uL (ref 3.87–5.11)
RDW: 12.5 % (ref 11.5–15.5)
WBC: 12.2 10*3/uL — AB (ref 4.0–10.5)

## 2014-06-06 LAB — RPR

## 2014-06-06 LAB — TYPE AND SCREEN
ABO/RH(D): O POS
Antibody Screen: NEGATIVE

## 2014-06-06 LAB — ABO/RH: ABO/RH(D): O POS

## 2014-06-06 MED ORDER — PENICILLIN G POTASSIUM 5000000 UNITS IJ SOLR
5.0000 10*6.[IU] | Freq: Once | INTRAMUSCULAR | Status: AC
  Administered 2014-06-06: 5 10*6.[IU] via INTRAVENOUS
  Filled 2014-06-06: qty 5

## 2014-06-06 MED ORDER — CITRIC ACID-SODIUM CITRATE 334-500 MG/5ML PO SOLN: 30.0000 mL | ORAL | Status: DC | PRN

## 2014-06-06 MED ORDER — LACTATED RINGERS IV SOLN
INTRAVENOUS | Status: DC
Start: 2014-06-06 — End: 2014-06-07
  Administered 2014-06-06 (×2): via INTRAVENOUS

## 2014-06-06 MED ORDER — OXYTOCIN 40 UNITS IN LACTATED RINGERS INFUSION - SIMPLE MED
62.5000 mL/h | INTRAVENOUS | Status: DC
Start: 2014-06-06 — End: 2014-06-07

## 2014-06-06 MED ORDER — FENTANYL 2.5 MCG/ML BUPIVACAINE 1/10 % EPIDURAL INFUSION (WH - ANES)
INTRAMUSCULAR | Status: AC
  Administered 2014-06-06: 14 mL/h via EPIDURAL
  Filled 2014-06-06: qty 125

## 2014-06-06 MED ORDER — EPHEDRINE 5 MG/ML INJ
10.0000 mg | INTRAVENOUS | Status: DC | PRN
  Filled 2014-06-06: qty 2

## 2014-06-06 MED ORDER — OXYCODONE-ACETAMINOPHEN 5-325 MG PO TABS: 1.0000 | ORAL_TABLET | ORAL | Status: DC | PRN

## 2014-06-06 MED ORDER — OXYTOCIN BOLUS FROM INFUSION: 500.0000 mL | INTRAVENOUS | Status: DC

## 2014-06-06 MED ORDER — LACTATED RINGERS IV SOLN
500.0000 mL | Freq: Once | INTRAVENOUS | Status: AC
  Administered 2014-06-06: 500 mL via INTRAVENOUS

## 2014-06-06 MED ORDER — LACTATED RINGERS IV SOLN: 500.0000 mL | INTRAVENOUS | Status: DC | PRN

## 2014-06-06 MED ORDER — ACETAMINOPHEN 325 MG PO TABS: 650.0000 mg | ORAL_TABLET | ORAL | Status: DC | PRN

## 2014-06-06 MED ORDER — FENTANYL 2.5 MCG/ML BUPIVACAINE 1/10 % EPIDURAL INFUSION (WH - ANES)
14.0000 mL/h | INTRAMUSCULAR | Status: DC | PRN
  Administered 2014-06-06 (×3): 14 mL/h via EPIDURAL
  Filled 2014-06-06 (×2): qty 125

## 2014-06-06 MED ORDER — TERBUTALINE SULFATE 1 MG/ML IJ SOLN: 0.2500 mg | Freq: Once | INTRAMUSCULAR | Status: AC | PRN

## 2014-06-06 MED ORDER — LIDOCAINE HCL (PF) 1 % IJ SOLN
30.0000 mL | INTRAMUSCULAR | Status: DC | PRN
  Filled 2014-06-06: qty 30

## 2014-06-06 MED ORDER — OXYCODONE-ACETAMINOPHEN 5-325 MG PO TABS: 2.0000 | ORAL_TABLET | ORAL | Status: DC | PRN

## 2014-06-06 MED ORDER — PHENYLEPHRINE 40 MCG/ML (10ML) SYRINGE FOR IV PUSH (FOR BLOOD PRESSURE SUPPORT)
80.0000 ug | PREFILLED_SYRINGE | INTRAVENOUS | Status: DC | PRN
  Filled 2014-06-06: qty 2

## 2014-06-06 MED ORDER — DIPHENHYDRAMINE HCL 50 MG/ML IJ SOLN: 12.5000 mg | INTRAMUSCULAR | Status: DC | PRN

## 2014-06-06 MED ORDER — ONDANSETRON HCL 4 MG/2ML IJ SOLN: 4.0000 mg | Freq: Four times a day (QID) | INTRAMUSCULAR | Status: DC | PRN

## 2014-06-06 MED ORDER — PHENYLEPHRINE 40 MCG/ML (10ML) SYRINGE FOR IV PUSH (FOR BLOOD PRESSURE SUPPORT)
PREFILLED_SYRINGE | INTRAVENOUS | Status: AC
  Filled 2014-06-06: qty 10

## 2014-06-06 MED ORDER — LIDOCAINE HCL (PF) 1 % IJ SOLN
INTRAMUSCULAR | Status: DC | PRN
Start: 2014-06-06 — End: 2014-06-09
  Administered 2014-06-06 (×2): 5 mL

## 2014-06-06 MED ORDER — FLEET ENEMA 7-19 GM/118ML RE ENEM: 1.0000 | ENEMA | RECTAL | Status: DC | PRN

## 2014-06-06 MED ORDER — PENICILLIN G POTASSIUM 5000000 UNITS IJ SOLR
2.5000 10*6.[IU] | INTRAMUSCULAR | Status: DC
  Administered 2014-06-06 (×2): 2.5 10*6.[IU] via INTRAVENOUS
  Filled 2014-06-06 (×6): qty 2.5

## 2014-06-06 MED ORDER — OXYTOCIN 40 UNITS IN LACTATED RINGERS INFUSION - SIMPLE MED
1.0000 m[IU]/min | INTRAVENOUS | Status: DC
  Administered 2014-06-06: 2 m[IU]/min via INTRAVENOUS
  Filled 2014-06-06: qty 1000

## 2014-06-06 NOTE — Plan of Care (Signed)
Problem: Phase I Progression Outcomes Goal: Assess per MD/Nurse,Routine-VS,FHR,UC,Head to Toe assess Outcome: Completed/Met Date Met:  06/06/14 Goal: Obtain and review prenatal records Outcome: Completed/Met Date Met:  06/06/14 Goal: Pain controlled with appropriate interventions Outcome: Completed/Met Date Met:  06/06/14  Problem: Phase II Progression Outcomes Goal: Fetal monitoring per orders Outcome: Completed/Met Date Met:  06/06/14 Goal: Frequent position change(s) Outcome: Completed/Met Date Met:  06/06/14

## 2014-06-06 NOTE — MAU Note (Signed)
Pt presents to MAU with complaints of contractions that started at 4 this morning and have gotten closer throughout the day.

## 2014-06-06 NOTE — Anesthesia Procedure Notes (Signed)
Epidural Patient location during procedure: OB Start time: 06/06/2014 3:15 PM  Staffing Anesthesiologist: Brayton CavesJACKSON, Sheniece Ruggles Performed by: anesthesiologist   Preanesthetic Checklist Completed: patient identified, site marked, surgical consent, pre-op evaluation, timeout performed, IV checked, risks and benefits discussed and monitors and equipment checked  Epidural Patient position: sitting Prep: site prepped and draped and DuraPrep Patient monitoring: continuous pulse ox and blood pressure Approach: midline Location: L3-L4 Injection technique: LOR air  Needle:  Needle type: Tuohy  Needle gauge: 17 G Needle length: 9 cm and 9 Needle insertion depth: 7 cm Catheter type: closed end flexible Catheter size: 19 Gauge Catheter at skin depth: 12 cm Test dose: negative  Assessment Events: blood not aspirated, injection not painful, no injection resistance, negative IV test and no paresthesia  Additional Notes Patient identified.  Risk benefits discussed including failed block, incomplete pain control, headache, nerve damage, paralysis, blood pressure changes, nausea, vomiting, reactions to medication both toxic or allergic, and postpartum back pain.  Patient expressed understanding and wished to proceed.  All questions were answered.  Sterile technique used throughout procedure and epidural site dressed with sterile barrier dressing. No paresthesia or other complications noted.The patient did not experience any signs of intravascular injection such as tinnitus or metallic taste in mouth nor signs of intrathecal spread such as rapid motor block. Please see nursing notes for vital signs.

## 2014-06-06 NOTE — Progress Notes (Signed)
Notified of pt arrival in MAU, SROM, vaginal exam. Received orders to admit pt

## 2014-06-06 NOTE — Anesthesia Preprocedure Evaluation (Signed)
Anesthesia Evaluation  Patient identified by MRN, date of birth, ID band Patient awake    Reviewed: Allergy & Precautions, H&P , Patient's Chart, lab work & pertinent test results  Airway Mallampati: III TM Distance: >3 FB Neck ROM: full    Dental   Pulmonary former smoker,  breath sounds clear to auscultation        Cardiovascular Rhythm:regular Rate:Normal     Neuro/Psych    GI/Hepatic   Endo/Other  Morbid obesity  Renal/GU      Musculoskeletal   Abdominal   Peds  Hematology   Anesthesia Other Findings   Reproductive/Obstetrics (+) Pregnancy                           Anesthesia Physical Anesthesia Plan  ASA: III  Anesthesia Plan: Epidural   Post-op Pain Management:    Induction:   Airway Management Planned:   Additional Equipment:   Intra-op Plan:   Post-operative Plan:   Informed Consent: I have reviewed the patients History and Physical, chart, labs and discussed the procedure including the risks, benefits and alternatives for the proposed anesthesia with the patient or authorized representative who has indicated his/her understanding and acceptance.     Plan Discussed with:   Anesthesia Plan Comments:         Anesthesia Quick Evaluation  

## 2014-06-07 ENCOUNTER — Encounter (HOSPITAL_COMMUNITY): Payer: Self-pay | Admitting: *Deleted

## 2014-06-07 DIAGNOSIS — Z3480 Encounter for supervision of other normal pregnancy, unspecified trimester: Secondary | ICD-10-CM

## 2014-06-07 LAB — CBC
HEMATOCRIT: 31.5 % — AB (ref 36.0–46.0)
Hemoglobin: 10.9 g/dL — ABNORMAL LOW (ref 12.0–15.0)
MCH: 31.5 pg (ref 26.0–34.0)
MCHC: 34.6 g/dL (ref 30.0–36.0)
MCV: 91 fL (ref 78.0–100.0)
Platelets: 214 10*3/uL (ref 150–400)
RBC: 3.46 MIL/uL — ABNORMAL LOW (ref 3.87–5.11)
RDW: 12.7 % (ref 11.5–15.5)
WBC: 19.6 10*3/uL — ABNORMAL HIGH (ref 4.0–10.5)

## 2014-06-07 MED ORDER — OXYCODONE-ACETAMINOPHEN 5-325 MG PO TABS
1.0000 | ORAL_TABLET | ORAL | Status: DC | PRN
  Administered 2014-06-07 (×2): 1 via ORAL
  Filled 2014-06-07 (×2): qty 1

## 2014-06-07 MED ORDER — SENNOSIDES-DOCUSATE SODIUM 8.6-50 MG PO TABS
2.0000 | ORAL_TABLET | ORAL | Status: DC
  Administered 2014-06-08: 2 via ORAL
  Filled 2014-06-07: qty 2

## 2014-06-07 MED ORDER — DIBUCAINE 1 % RE OINT: 1.0000 "application " | TOPICAL_OINTMENT | RECTAL | Status: DC | PRN

## 2014-06-07 MED ORDER — SIMETHICONE 80 MG PO CHEW: 80.0000 mg | CHEWABLE_TABLET | ORAL | Status: DC | PRN

## 2014-06-07 MED ORDER — IBUPROFEN 600 MG PO TABS
600.0000 mg | ORAL_TABLET | Freq: Four times a day (QID) | ORAL | Status: DC
  Administered 2014-06-07 – 2014-06-08 (×6): 600 mg via ORAL
  Filled 2014-06-07 (×6): qty 1

## 2014-06-07 MED ORDER — BENZOCAINE-MENTHOL 20-0.5 % EX AERO
1.0000 "application " | INHALATION_SPRAY | CUTANEOUS | Status: DC | PRN
  Administered 2014-06-07 – 2014-06-08 (×2): 1 via TOPICAL
  Filled 2014-06-07 (×2): qty 56

## 2014-06-07 MED ORDER — OXYCODONE-ACETAMINOPHEN 5-325 MG PO TABS
2.0000 | ORAL_TABLET | ORAL | Status: DC | PRN
  Administered 2014-06-08 (×2): 2 via ORAL
  Filled 2014-06-07 (×2): qty 2

## 2014-06-07 MED ORDER — ONDANSETRON HCL 4 MG PO TABS: 4.0000 mg | ORAL_TABLET | ORAL | Status: DC | PRN

## 2014-06-07 MED ORDER — ZOLPIDEM TARTRATE 5 MG PO TABS: 5.0000 mg | ORAL_TABLET | Freq: Every evening | ORAL | Status: DC | PRN

## 2014-06-07 MED ORDER — ACETAMINOPHEN 500 MG PO TABS
1000.0000 mg | ORAL_TABLET | Freq: Once | ORAL | Status: AC
  Administered 2014-06-07: 1000 mg via ORAL
  Filled 2014-06-07: qty 2

## 2014-06-07 MED ORDER — MEASLES, MUMPS & RUBELLA VAC ~~LOC~~ INJ
0.5000 mL | INJECTION | Freq: Once | SUBCUTANEOUS | Status: DC
  Filled 2014-06-07: qty 0.5

## 2014-06-07 MED ORDER — WITCH HAZEL-GLYCERIN EX PADS: 1.0000 "application " | MEDICATED_PAD | CUTANEOUS | Status: DC | PRN

## 2014-06-07 MED ORDER — TETANUS-DIPHTH-ACELL PERTUSSIS 5-2.5-18.5 LF-MCG/0.5 IM SUSP: 0.5000 mL | Freq: Once | INTRAMUSCULAR | Status: DC

## 2014-06-07 MED ORDER — ONDANSETRON HCL 4 MG/2ML IJ SOLN: 4.0000 mg | INTRAMUSCULAR | Status: DC | PRN

## 2014-06-07 NOTE — Plan of Care (Signed)
Problem: Phase I Progression Outcomes Goal: Pain controlled with appropriate interventions Outcome: Completed/Met Date Met:  06/07/14 Goal: VS, stable, temp < 100.4 degrees F Outcome: Completed/Met Date Met:  06/07/14 Goal: Initial discharge plan identified Outcome: Completed/Met Date Met:  06/07/14

## 2014-06-07 NOTE — Plan of Care (Signed)
Problem: Consults Goal: Postpartum Patient Education (See Patient Education module for education specifics.)  Outcome: Progressing Goal: Skin Care Protocol Initiated - if Braden Score 18 or less If consults are not indicated, leave blank or document N/A  Outcome: Not Applicable Date Met:  37/29/02 Goal: Nutrition Consult-if indicated Outcome: Not Applicable Date Met:  06/02/51 Goal: Diabetes Guidelines if Diabetic/Glucose > 140 If diabetic or lab glucose is > 140 mg/dl - Initiate Diabetes/Hyperglycemia Guidelines & Document Interventions  Outcome: Not Applicable Date Met:  02/16/22  Problem: Phase I Progression Outcomes Goal: Voiding adequately Outcome: Completed/Met Date Met:  06/07/14 Goal: OOB as tolerated unless otherwise ordered Outcome: Completed/Met Date Met:  06/07/14 Goal: Other Phase I Outcomes/Goals Outcome: Completed/Met Date Met:  06/07/14  Problem: Phase II Progression Outcomes Goal: Pain controlled on oral analgesia Outcome: Completed/Met Date Met:  06/07/14 Goal: Progress activity as tolerated unless otherwise ordered Outcome: Completed/Met Date Met:  06/07/14 Goal: Afebrile, VS remain stable Outcome: Completed/Met Date Met:  06/07/14 Goal: Tolerating diet Outcome: Completed/Met Date Met:  06/07/14 Goal: Other Phase II Outcomes/Goals Outcome: Completed/Met Date Met:  06/07/14

## 2014-06-07 NOTE — Anesthesia Postprocedure Evaluation (Signed)
  Anesthesia Post-op Note  Patient: Nicole Mclean  Procedure(s) Performed: * No procedures listed *  Patient Location: Mother/Baby  Anesthesia Type:Epidural  Level of Consciousness: awake  Airway and Oxygen Therapy: Patient Spontanous Breathing  Post-op Pain: mild  Post-op Assessment: Patient's Cardiovascular Status Stable and Respiratory Function Stable  Post-op Vital Signs: stable  Last Vitals:  Filed Vitals:   06/07/14 1021  BP: 106/60  Pulse: 97  Temp: 36.9 C  Resp: 18    Complications: No apparent anesthesia complications

## 2014-06-07 NOTE — H&P (Signed)
Pt is a 18 y/o black female G2P0010 at term who presents to the ER c/o SROM. In the ER the pt had +pool,+ fern. She was 4 cm on admission. She had no complications with PNC. PMHX: Please read prenatal record in media PE: VSSAF         HEENT-wnl         ABD-gravid, palp contraction         FHTs -reactive,  IMP/ IUP at term with SROM Plan/ Admit.

## 2014-06-08 MED ORDER — IBUPROFEN 600 MG PO TABS: 600.0000 mg | ORAL_TABLET | Freq: Four times a day (QID) | ORAL | Status: DC

## 2014-06-08 NOTE — Plan of Care (Signed)
Problem: Discharge Progression Outcomes Goal: Tolerating diet Outcome: Completed/Met Date Met:  06/08/14 Goal: Pain controlled with appropriate interventions Outcome: Completed/Met Date Met:  06/08/14

## 2014-06-08 NOTE — Plan of Care (Signed)
Problem: Consults Goal: Postpartum Patient Education (See Patient Education module for education specifics.)  Outcome: Completed/Met Date Met:  06/08/14

## 2014-06-08 NOTE — Plan of Care (Signed)
Problem: Discharge Progression Outcomes Goal: Afebrile, VS remain stable at discharge Outcome: Completed/Met Date Met:  06/08/14

## 2014-06-08 NOTE — Progress Notes (Signed)
PPD#1 Pt doing well. She is not ready for discharge. Baby is doing well.  VSSAF IMP/ Stable  Plan/ routine care.

## 2014-06-08 NOTE — Progress Notes (Signed)
Clinical Social Work Department PSYCHOSOCIAL ASSESSMENT - MATERNAL/CHILD 06/08/2014  Patient:  Nicole Mclean, Nicole Mclean  Account Number:  [id number]  Admit Date:  06/06/2014  Ardine Eng Name:   Andres Labrum    Clinical Social Worker:  Suleyman Ehrman, LCSW   Date/Time:  06/08/2014 10:00 AM  Date Referred:  06/07/2014   Referral source  Central Nursery     Referred reason  [facility]   Other referral source:    I:  FAMILY / HOME ENVIRONMENT Child's legal guardian:  PARENT  Guardian - Name Guardian - Age Guardian - Address  SHAKIYAH, CIRILO E [address], [place] [address]  Lupe Carney     Other household support members/support persons Other support:   Paternal relatives    II  PSYCHOSOCIAL DATA Information Source:    Occupational hygienist Employment:   Both parents are employed   Museum/gallery curator resources:  [employer] [place]:   Other  [place] / Grade:   Maternity Care Coordinator / Child Services Coordination / Early Interventions:  Cultural issues impacting care:    III  STRENGTHS Strengths  Supportive family/friends  Home prepared for Child (including basic supplies)  Adequate Resources   Strength comment:    IV  RISK FACTORS AND CURRENT PROBLEMS Current Problem:     Risk Factor & Current Problem Patient Issue Family Issue Risk Factor / Current Problem Comment   N N     V  SOCIAL WORK ASSESSMENT Acknowledged order for social work consult due to limited prenatal care.  Met with mother who was pleasant and receptive to social work intervention.  She is a single parent with no other dependents.   FOB was present but did not participate in the discussion.  Mother states that she started prenatal care when she was about 6 to [redacted] weeks pregnant at [facility] in [place], and continued with [facility] when she moved to [place].  Mother states that she has been staying at [facility] since  June and plans to return with the baby.  Informed that she is employed and has been working towards gaining independence.  Informed that she has been approved for two low income housing developments and on the waiting list for both.  Mother states that she is in college to become a Education officer, museum, but is taking time off because of the baby.  She and FOB are still in a relationship and she relates that his family is very supportive of her and the baby.  She is reportedly well prepared for newborn.    She reportedly have 10 siblings and states that she was in the fostercare system and still has contact with her foster parents who has reportedly been supportive.   She denies hx of mental illness or substance abuse.  UDS on newborn was negative.   Mother informed of the hospital's drug screen policy.   No acute social concerns noted or reported at this time.  Mother informed of social work Fish farm manager.      VI SOCIAL WORK PLAN  Type of pt/family education:   If child protective services report - county:   If child protective services report - date:   Information/referral to community resources comment:   Other social work plan:   Will continue to monitor drug screen

## 2014-06-08 NOTE — Plan of Care (Signed)
Problem: Discharge Progression Outcomes Goal: Barriers To Progression Addressed/Resolved Outcome: Not Applicable Date Met:  17/79/39 Goal: Activity appropriate for discharge plan Outcome: Completed/Met Date Met:  03/00/92 Goal: Complications resolved/controlled Outcome: Not Applicable Date Met:  33/00/76 Goal: Discharge plan in place and appropriate Outcome: Completed/Met Date Met:  06/08/14 Goal: Other Discharge Outcomes/Goals Outcome: Completed/Met Date Met:  06/08/14

## 2014-06-09 NOTE — Progress Notes (Signed)
Post discharge ur review completed. 

## 2014-06-17 NOTE — Discharge Summary (Signed)
Obstetric Discharge Summary Reason for Admission: rupture of membranes Prenatal Procedures: ultrasound Intrapartum Procedures: spontaneous vaginal delivery Postpartum Procedures: none Complications-Operative and Postpartum: 1st degree perineal laceration HEMOGLOBIN  Date Value Ref Range Status  06/07/2014 10.9* 12.0 - 15.0 g/dL Final   HCT  Date Value Ref Range Status  06/07/2014 31.5* 36.0 - 46.0 % Final    Physical Exam:  General: alert Lochia: appropriate Uterine Fundus: firm   Discharge Diagnoses: Term Pregnancy-delivered  Discharge Information: Date: 06/17/2014 Activity: pelvic rest Diet: routine Medications: PNV, Ibuprofen and Percocet Condition: stable Instructions: refer to practice specific booklet Discharge to: home   Newborn Data: Live born female  Birth Weight: 7 lb 5.1 oz (3320 g) APGAR: 9, 9  Home with mother.  Nicole Mclean E 06/17/2014, 8:23 AM

## 2014-07-18 NOTE — L&D Delivery Note (Signed)
Delivery Note Delivery Note  This is a 19 year old G 3 P1 who was admitted for Not in labor. and elective IOL at term. She progressed normally with cytotec and epidural to the second stage of labor.  She pushed for 15 min.  At 3:25 AM a viable female was delivered via Vaginal, Spontaneous Delivery (Presentation: Left Occiput Anterior).  APGAR: 8, 9; weight  .  A nuchal cord   was not identified. Infant placed on maternal abdomen.  Delayed cord clamping was performed for 15  minutes.  Cord double clamped and cut.  Apgar scores were 8 and 9. The placenta delivered spontaneously, shultz, with a 3 vessel cord.  Inspection revealed none. The uterus was firm bleeding stable.  EBL was 75.    Placenta was sent to pathology for calcifications and decels prior to delivery.  There were no complications during the procedure.  Mom and baby skin to skin following delivery. Left in stable condition.  Placenta status: Intact, Spontaneous.  Cord:  with the following complications: .  Cord pH: N/A  Anesthesia: Epidural  Episiotomy:  none Lacerations:  none Suture Repair: none Est. Blood Loss (mL):  75  Mom to postpartum.  Baby to Couplet care / Skin to Skin.  Roe Coombsachelle A Kayron Kalmar, CNM 07/02/2015, 4:04 AM

## 2014-10-31 ENCOUNTER — Encounter (HOSPITAL_COMMUNITY): Payer: Self-pay | Admitting: *Deleted

## 2014-10-31 ENCOUNTER — Inpatient Hospital Stay (HOSPITAL_COMMUNITY)
Admission: AD | Admit: 2014-10-31 | Discharge: 2014-10-31 | Disposition: A | Discharge status: Home / Self Care | Payer: Medicaid Other | Source: Ambulatory Visit | Attending: Obstetrics & Gynecology | Admitting: Obstetrics & Gynecology

## 2014-10-31 DIAGNOSIS — Z3A01 Less than 8 weeks gestation of pregnancy: Secondary | ICD-10-CM | POA: Diagnosis not present

## 2014-10-31 DIAGNOSIS — Z3201 Encounter for pregnancy test, result positive: Secondary | ICD-10-CM | POA: Diagnosis not present

## 2014-10-31 DIAGNOSIS — Z87891 Personal history of nicotine dependence: Secondary | ICD-10-CM | POA: Insufficient documentation

## 2014-10-31 DIAGNOSIS — O210 Mild hyperemesis gravidarum: Secondary | ICD-10-CM | POA: Diagnosis not present

## 2014-10-31 LAB — URINALYSIS, ROUTINE W REFLEX MICROSCOPIC
Bilirubin Urine: NEGATIVE
GLUCOSE, UA: NEGATIVE mg/dL
Hgb urine dipstick: NEGATIVE
Ketones, ur: NEGATIVE mg/dL
Leukocytes, UA: NEGATIVE
NITRITE: NEGATIVE
PH: 6.5 (ref 5.0–8.0)
Protein, ur: NEGATIVE mg/dL
Specific Gravity, Urine: 1.02 (ref 1.005–1.030)
Urobilinogen, UA: 0.2 mg/dL (ref 0.0–1.0)

## 2014-10-31 LAB — POCT PREGNANCY, URINE: PREG TEST UR: POSITIVE — AB

## 2014-10-31 NOTE — MAU Note (Signed)
Back to use restroom 

## 2014-10-31 NOTE — Discharge Instructions (Signed)
°  Your Pregnancy test is positive Report any vaginal bleeding or abdominal pain to Dr. Tenny Crawoss

## 2014-10-31 NOTE — MAU Note (Signed)
Started period 2 days ago, was just spotting and brown. Been having really really bad headaches. Nausea.

## 2014-10-31 NOTE — MAU Provider Note (Signed)
History     CSN: [id number]  Arrival date and time: 10/31/14 1304   None     Chief Complaint  Patient presents with  . Headache  . Nausea  . Possible Pregnancy   HPI Nicole Mclean is 19 y.o. G3P1011 [redacted]w[redacted]d weeks presenting with nausea.  She is a patient of Dr. Tenny Craw' delivered 06/07/14.  She is not nursing.  Was using condoms most of the time of contraception.  Came in for pregnancy sxs after friend told her she may be pregnant.  UPT here is positive.  Needs confirmation letter and note for work.   She declines medication for nausea.    Past Medical History  Diagnosis Date  . Medical history non-contributory   . Gonorrhea April 2015    Past Surgical History  Procedure Laterality Date  . Rhytidectomy neck / cheek / chin    . Hand surgery      Left  hand  . Hand surgery      left   . Chin surgery      History reviewed. No pertinent family history.  History  Substance Use Topics  . Smoking status: Former Smoker    Types: Cigars    Quit date: 08/04/2013  . Smokeless tobacco: Never Used  . Alcohol Use: No    Allergies: No Known Allergies  Prescriptions prior to admission  Medication Sig Dispense Refill Last Dose  . ibuprofen (ADVIL,MOTRIN) 600 MG tablet Take 1 tablet (600 mg total) by mouth every 6 (six) hours. 30 tablet 0   . Prenatal Vit-Fe Fumarate-FA (PRENATAL VITAMIN PO) Take 1 tablet by mouth daily.   06/06/2014 at Unknown time    Review of Systems  Constitutional: Negative for fever.  Gastrointestinal: Positive for nausea. Negative for vomiting and abdominal pain.  Genitourinary: Negative for dysuria, urgency, frequency and hematuria.       Neg for vaginal bleeding.   Neurological: Negative for headaches.   Physical Exam   Blood pressure 124/71, pulse 100, temperature 98.3 F (36.8 C), temperature source Oral, resp. rate 16, height 5' (1.524 m), weight 198 lb (89.812 kg), last menstrual period 10/01/2014, not currently breastfeeding.  Physical Exam   Constitutional: She is oriented to person, place, and time. She appears well-developed and well-nourished. No distress.  Genitourinary:  Pelvic not indicated  Neurological: She is alert and oriented to person, place, and time.  Skin: Skin is warm and dry.  Psychiatric: She has a normal mood and affect. Her behavior is normal.    Results for orders placed or performed during the hospital encounter of 10/31/14 (from the past 24 hour(s))  Urinalysis, Routine w reflex microscopic     Status: None   Collection Time: 10/31/14  1:10 PM  Result Value Ref Range   Color, Urine YELLOW YELLOW   APPearance CLEAR CLEAR   Specific Gravity, Urine 1.020 1.005 - 1.030   pH 6.5 5.0 - 8.0   Glucose, UA NEGATIVE NEGATIVE mg/dL   Hgb urine dipstick NEGATIVE NEGATIVE   Bilirubin Urine NEGATIVE NEGATIVE   Ketones, ur NEGATIVE NEGATIVE mg/dL   Protein, ur NEGATIVE NEGATIVE mg/dL   Urobilinogen, UA 0.2 0.0 - 1.0 mg/dL   Nitrite NEGATIVE NEGATIVE   Leukocytes, UA NEGATIVE NEGATIVE  Pregnancy, urine POC     Status: Abnormal   Collection Time: 10/31/14  1:23 PM  Result Value Ref Range   Preg Test, Ur POSITIVE (A) NEGATIVE   MAU Course  Procedures MDM  Assessment and Plan  A:  +  UPT  P:  Call Dr. Tenny Crawoss to make appointment to begin prenatal care       Confirmation letter and note for work given to patient       Patient declines med for nausea   Kirstina Leinweber,EVE M 10/31/2014, 1:30 PM

## 2014-11-05 ENCOUNTER — Inpatient Hospital Stay (HOSPITAL_COMMUNITY): Payer: Medicaid Other

## 2014-11-05 ENCOUNTER — Encounter (HOSPITAL_COMMUNITY): Payer: Self-pay | Admitting: *Deleted

## 2014-11-05 ENCOUNTER — Inpatient Hospital Stay (HOSPITAL_COMMUNITY)
Admission: AD | Admit: 2014-11-05 | Discharge: 2014-11-05 | Disposition: A | Discharge status: Home / Self Care | Payer: Medicaid Other | Source: Ambulatory Visit | Attending: Obstetrics & Gynecology | Admitting: Obstetrics & Gynecology

## 2014-11-05 DIAGNOSIS — R109 Unspecified abdominal pain: Secondary | ICD-10-CM | POA: Insufficient documentation

## 2014-11-05 DIAGNOSIS — O208 Other hemorrhage in early pregnancy: Secondary | ICD-10-CM | POA: Diagnosis not present

## 2014-11-05 DIAGNOSIS — Z331 Pregnant state, incidental: Secondary | ICD-10-CM | POA: Diagnosis not present

## 2014-11-05 DIAGNOSIS — Z3A01 Less than 8 weeks gestation of pregnancy: Secondary | ICD-10-CM | POA: Diagnosis not present

## 2014-11-05 DIAGNOSIS — O26899 Other specified pregnancy related conditions, unspecified trimester: Secondary | ICD-10-CM

## 2014-11-05 DIAGNOSIS — O3680X0 Pregnancy with inconclusive fetal viability, not applicable or unspecified: Secondary | ICD-10-CM

## 2014-11-05 LAB — CBC
HCT: 37.2 % (ref 36.0–46.0)
HEMOGLOBIN: 12.9 g/dL (ref 12.0–15.0)
MCH: 29.9 pg (ref 26.0–34.0)
MCHC: 34.7 g/dL (ref 30.0–36.0)
MCV: 86.1 fL (ref 78.0–100.0)
Platelets: 295 10*3/uL (ref 150–400)
RBC: 4.32 MIL/uL (ref 3.87–5.11)
RDW: 12.8 % (ref 11.5–15.5)
WBC: 9.8 10*3/uL (ref 4.0–10.5)

## 2014-11-05 LAB — HCG, QUANTITATIVE, PREGNANCY: HCG, BETA CHAIN, QUANT, S: 6336 m[IU]/mL — AB (ref <5)

## 2014-11-05 LAB — ABO/RH: ABO/RH(D): O POS

## 2014-11-05 NOTE — Discharge Instructions (Signed)

## 2014-11-05 NOTE — MAU Note (Signed)
Urine in lab 

## 2014-11-05 NOTE — MAU Note (Signed)
Was here on Friday found out she was pregnant.  Went to clinic yesterday for termination, on US- there was nothing in her uterus.  Started cramping last night, has been having diarrhea. Wants to know what is going on .

## 2014-11-05 NOTE — MAU Provider Note (Signed)
History     CSN: [id number]  Arrival date and time: 11/05/14 1337    Chief Complaint  Patient presents with  . Abdominal Pain   HPI:   Nicole Mclean is a 19 y.o. presenting to Maternity Admissions for abdominal pain that started last night. She is currently [redacted] weeks pregnant, and found out on 10/31/2014 at the MAU. She went to terminate the pregnancy yesterday but reports they could not see anything on ultrasound and did not perform any procedures or prescribe any medications. She reports the pain feels like a constant "crampy" sensation and rates it as an 8/10. She has not taken anything for the pain. She reports the pain is better when lying down.  The patient also reports having nausea and vomiting for the past few weeks. She reports having 1 episode of vomiting today. She also reports having diarrhea since yesterday. She reports 8-9 episodes of diarrhea today and says it appears to have mucous, but denies seeing any blood in her stool. She denies being exposed to any sick contacts recently.     OB History    Gravida Para Term Preterm AB TAB SAB Ectopic Multiple Living   3 1 1  0 1 0 1 0 0 1      Past Medical History  Diagnosis Date  . Medical history non-contributory   . Gonorrhea April 2015   History reviewed. No pertinent family history. Past Surgical History  Procedure Laterality Date  . Rhytidectomy neck / cheek / chin    . Hand surgery      Left  hand  . Hand surgery      left   . Chin surgery      History reviewed. No pertinent family history.  History  Substance Use Topics  . Smoking status: Former Smoker    Types: Cigars    Quit date: 08/04/2013  . Smokeless tobacco: Never Used  . Alcohol Use: No    Allergies: No Known Allergies  No prescriptions prior to admission    CLINICAL DATA: Pelvic cramping since last night.  EXAM: OBSTETRIC <14 WK US AND TRANSVAGINAL OB US  TECHNIQUE: Both transabdominal and transvaginal ultrasound examinations  were performed for complete evaluation of the gestation as well as the maternal uterus, adnexal regions, and pelvic cul-de-sac. Transvaginal technique was performed to assess early pregnancy.  COMPARISON: None.  FINDINGS: Intrauterine gestational sac: Visualized/normal in shape.  Yolk sac: Not visualized  Embryo: Not visualized  Cardiac Activity: Not visualized  Heart Rate: bpm  MSD: 5.4 mm 5 w 1 d  CRL: mm w d US EDC:  Maternal uterus/adnexae: Moderate-sized subchorionic hemorrhage. No adnexal masses. No free fluid.  IMPRESSION: Early intrauterine gestational sac without fetal pole currently. Estimated gestational age [redacted] weeks 1 day. Moderate-sized subchorionic hemorrhage.   Electronically Signed  By: Charlett NoseKevin Dover M.D.  On: 11/05/2014 15:32    Review of Systems  Constitutional: Negative for fever and chills.  HENT: Positive for congestion. Negative for sore throat.   Eyes: Negative for blurred vision.  Respiratory: Negative for cough, sputum production, shortness of breath and wheezing.   Cardiovascular: Negative for chest pain, palpitations and leg swelling.  Gastrointestinal: Positive for nausea, vomiting, abdominal pain and diarrhea. Negative for constipation and blood in stool.  Genitourinary: Negative for dysuria, urgency and frequency.  Neurological: Positive for headaches. Negative for dizziness, seizures and loss of consciousness.   Physical Exam   Blood pressure 139/65, pulse 91, temperature 98.8 F (37.1 C), temperature source Oral,  resp. rate 18, height  (1.549 m), weight 89.359 kg (197 lb), last menstrual period 10/01/2014, not currently breastfeeding.  Physical Exam  Nursing note and vitals reviewed. Constitutional: She is oriented to person, place, and time. She appears well-developed and well-nourished. No distress.  HENT:  Head: Normocephalic and atraumatic.  Eyes: Pupils are equal, round,  and reactive to light. Right eye exhibits no discharge. Left eye exhibits no discharge.  Neck: Normal range of motion. Neck supple. No thyromegaly present.  Cardiovascular: Normal rate, regular rhythm, normal heart sounds and intact distal pulses.  Exam reveals no gallop and no friction rub.   No murmur heard. Respiratory: Effort normal and breath sounds normal. No stridor. No respiratory distress. She has no wheezes. She has no rales. She exhibits no tenderness.  GI: Soft. Bowel sounds are normal. She exhibits no distension and no mass. There is tenderness (mild suprapubic tenderness). There is no rebound and no guarding.  Musculoskeletal: Normal range of motion. She exhibits no edema or tenderness.  Lymphadenopathy:    She has no cervical adenopathy.  Neurological: She is alert and oriented to person, place, and time.  Skin: Skin is warm and dry. No rash noted. She is not diaphoretic. No erythema. No pallor.    MAU Course  Procedures None  MDM:  US showed gestational sac but no yolk sac or embryo.  Quant hCG was 6336 today. CBC within normal limits.  Assessment and Plan  A:  Pregnancy of unknown anatomic location  Abdominal Pain associated with pregnancy P:  Patient will follow up in clinic on Friday for repeat quant hCG.  Instructed to take OTC Motrin PRN 4-6hrs as needed for pain.  Work note given to patient for today's MAU visit and this upcoming Friday's appointment.   Duane Lope, NP 11/10/2014 8:02 AM

## 2014-11-06 LAB — HIV ANTIBODY (ROUTINE TESTING W REFLEX): HIV SCREEN 4TH GENERATION: NONREACTIVE

## 2014-11-07 ENCOUNTER — Telehealth: Payer: Self-pay | Admitting: General Practice

## 2014-11-07 ENCOUNTER — Other Ambulatory Visit: Payer: Medicaid Other

## 2014-11-07 DIAGNOSIS — O3680X0 Pregnancy with inconclusive fetal viability, not applicable or unspecified: Secondary | ICD-10-CM

## 2014-11-07 DIAGNOSIS — Z3490 Encounter for supervision of normal pregnancy, unspecified, unspecified trimester: Secondary | ICD-10-CM

## 2014-11-07 LAB — HCG, QUANTITATIVE, PREGNANCY: hCG, Beta Chain, Quant, S: 12042 m[IU]/mL

## 2014-11-07 NOTE — Progress Notes (Signed)
Patient here for bhcg today. bhcg ordered stat per MAU note. Patient reports no suprapubic or abdominal pain. Patient states she continues to have worsening headaches. Recommended to pain tylenol 1000mg  every 8 hours OR ibuprofen 800mg  every 8 hours. Patient aware someone will contact her if results are concerning. Patient had no questions

## 2014-11-07 NOTE — Telephone Encounter (Signed)
-----   Message from Levie HeritageJacob J Stinson, DO sent at 11/07/2014 10:46 AM EDT ----- HCG almost doubled.  No need for further quants.  F/u US in 10-14 days for viability.

## 2014-11-07 NOTE — Telephone Encounter (Signed)
Called patient and informed her of results and recommendations. Patient requested morning appt. Ultrasound scheduled for 5/3 @ 9:45. Patient verbalized understanding and had no other questions

## 2014-11-15 ENCOUNTER — Encounter (HOSPITAL_COMMUNITY): Payer: Self-pay | Admitting: Emergency Medicine

## 2014-11-15 ENCOUNTER — Emergency Department (HOSPITAL_COMMUNITY)
Admission: EM | Admit: 2014-11-15 | Discharge: 2014-11-15 | Disposition: A | Discharge status: Home / Self Care | Payer: Medicaid Other | Attending: Emergency Medicine | Admitting: Emergency Medicine

## 2014-11-15 DIAGNOSIS — Z87891 Personal history of nicotine dependence: Secondary | ICD-10-CM | POA: Diagnosis not present

## 2014-11-15 DIAGNOSIS — O210 Mild hyperemesis gravidarum: Secondary | ICD-10-CM | POA: Diagnosis present

## 2014-11-15 DIAGNOSIS — Z8619 Personal history of other infectious and parasitic diseases: Secondary | ICD-10-CM | POA: Insufficient documentation

## 2014-11-15 DIAGNOSIS — Z3A01 Less than 8 weeks gestation of pregnancy: Secondary | ICD-10-CM | POA: Insufficient documentation

## 2014-11-15 DIAGNOSIS — Z79899 Other long term (current) drug therapy: Secondary | ICD-10-CM | POA: Diagnosis not present

## 2014-11-15 LAB — CBC WITH DIFFERENTIAL/PLATELET
BASOS ABS: 0 10*3/uL (ref 0.0–0.1)
BASOS PCT: 0 % (ref 0–1)
EOS ABS: 0.1 10*3/uL (ref 0.0–0.7)
Eosinophils Relative: 1 % (ref 0–5)
HCT: 36.6 % (ref 36.0–46.0)
Hemoglobin: 12.1 g/dL (ref 12.0–15.0)
Lymphocytes Relative: 33 % (ref 12–46)
Lymphs Abs: 3.8 10*3/uL (ref 0.7–4.0)
MCH: 28.8 pg (ref 26.0–34.0)
MCHC: 33.1 g/dL (ref 30.0–36.0)
MCV: 87.1 fL (ref 78.0–100.0)
MONO ABS: 0.7 10*3/uL (ref 0.1–1.0)
Monocytes Relative: 6 % (ref 3–12)
Neutro Abs: 6.9 10*3/uL (ref 1.7–7.7)
Neutrophils Relative %: 60 % (ref 43–77)
Platelets: 338 10*3/uL (ref 150–400)
RBC: 4.2 MIL/uL (ref 3.87–5.11)
RDW: 12.7 % (ref 11.5–15.5)
WBC: 11.4 10*3/uL — ABNORMAL HIGH (ref 4.0–10.5)

## 2014-11-15 LAB — URINALYSIS, ROUTINE W REFLEX MICROSCOPIC
Bilirubin Urine: NEGATIVE
Glucose, UA: NEGATIVE mg/dL
Hgb urine dipstick: NEGATIVE
KETONES UR: NEGATIVE mg/dL
LEUKOCYTES UA: NEGATIVE
NITRITE: NEGATIVE
PROTEIN: NEGATIVE mg/dL
Specific Gravity, Urine: 1.013 (ref 1.005–1.030)
UROBILINOGEN UA: 0.2 mg/dL (ref 0.0–1.0)
pH: 6.5 (ref 5.0–8.0)

## 2014-11-15 LAB — BASIC METABOLIC PANEL
ANION GAP: 7 (ref 5–15)
BUN: 7 mg/dL (ref 6–23)
CHLORIDE: 105 mmol/L (ref 96–112)
CO2: 24 mmol/L (ref 19–32)
Calcium: 9.3 mg/dL (ref 8.4–10.5)
Creatinine, Ser: 0.48 mg/dL — ABNORMAL LOW (ref 0.50–1.10)
GFR calc non Af Amer: >90 mL/min (ref >90)
Glucose, Bld: 88 mg/dL (ref 70–99)
POTASSIUM: 4.2 mmol/L (ref 3.5–5.1)
Sodium: 136 mmol/L (ref 135–145)

## 2014-11-15 MED ORDER — SODIUM CHLORIDE 0.9 % IV BOLUS (SEPSIS)
1000.0000 mL | Freq: Once | INTRAVENOUS | Status: AC
  Administered 2014-11-15: 1000 mL via INTRAVENOUS

## 2014-11-15 MED ORDER — METOCLOPRAMIDE HCL 5 MG/ML IJ SOLN
10.0000 mg | Freq: Once | INTRAMUSCULAR | Status: AC
  Administered 2014-11-15: 10 mg via INTRAVENOUS
  Filled 2014-11-15: qty 2

## 2014-11-15 MED ORDER — METOCLOPRAMIDE HCL 10 MG PO TABS: 10.0000 mg | ORAL_TABLET | Freq: Four times a day (QID) | ORAL | Status: DC

## 2014-11-15 NOTE — Discharge Instructions (Signed)
You can also try taking over the counter Vitamin B6 along with Pepcid.

## 2014-11-15 NOTE — ED Notes (Signed)
Per pt, increased nausea and vomiting today-[redacted] weeks pregnant

## 2014-11-15 NOTE — ED Notes (Signed)
Bed: WA06 Expected date:  Expected time:  Means of arrival:  Comments: For triage 2

## 2014-11-15 NOTE — ED Provider Notes (Signed)
CSN: [id number]     Arrival date & time 11/15/14  1405 History   First MD Initiated Contact with Patient 11/15/14 1522     Chief Complaint  Patient presents with  . Emesis During Pregnancy     (Consider location/radiation/quality/duration/timing/severity/associated sxs/prior Treatment) HPI Comments: Patient who is currently [redacted] weeks pregnant presents today with complaints of nausea and vomiting.  She states that she has been nauseous over the past month, but began vomiting over the past week.  She reports 6-7 episodes of vomiting daily.  No blood in her emesis.  She has not taken anything for her symptoms prior to arrival.  She denies diarrhea, fever, chills, abdominal pain, pelvic pain, vaginal bleeding, or vaginal discharge.  She states that she does have intermittent lightheadedness.  Denies headache or syncope.  She also reports increased urinary frequency, but denies dysuria or urgency.  She was seen at the MAU on 11/05/14 and had an ultrasound done at that time.  Ultrasound showed an intrauterine gestational sac without fetal pole and moderate subchorionic hemorrhage.  She is due for a repeat Ultrasound at the OB/GYN office on 11/18/14.  The history is provided by the patient.    Past Medical History  Diagnosis Date  . Medical history non-contributory   . Gonorrhea April 2015   Past Surgical History  Procedure Laterality Date  . Rhytidectomy neck / cheek / chin    . Hand surgery      Left  hand  . Hand surgery      left   . Chin surgery     No family history on file. History  Substance Use Topics  . Smoking status: Former Smoker    Types: Cigars    Quit date: 08/04/2013  . Smokeless tobacco: Never Used  . Alcohol Use: No   OB History    Gravida Para Term Preterm AB TAB SAB Ectopic Multiple Living   0 1 0 1 0 0 1     Review of Systems  All other systems reviewed and are negative.     Allergies  Review of patient's allergies indicates no known  allergies.  Home Medications   Prior to Admission medications   Medication Sig Start Date End Date Taking? Authorizing Provider  Prenatal Vit-Fe Fumarate-FA (PRENATAL COMPLETE) 14-0.4 MG TABS Take 1 tablet by mouth daily. 10/16/13  Yes Historical Provider, MD   BP 114/54 mmHg  Pulse 60  Temp(Src) 98.3 F (36.8 C) (Oral)  Resp 16  SpO2 99%  LMP 10/01/2014 Physical Exam  Constitutional: She appears well-developed and well-nourished.  HENT:  Head: Normocephalic and atraumatic.  Neck: Normal range of motion. Neck supple.  Cardiovascular: Normal rate, regular rhythm and normal heart sounds.   Pulmonary/Chest: Effort normal and breath sounds normal.  Abdominal: Soft. Bowel sounds are normal. She exhibits no distension and no mass. There is no tenderness. There is no rebound and no guarding.  Neurological: She is alert.  Skin: Skin is warm and dry.  Psychiatric: She has a normal mood and affect.  Nursing note and vitals reviewed.   ED Course  Procedures (including critical care time) Labs Review Labs Reviewed  CBC WITH DIFFERENTIAL/PLATELET  BASIC METABOLIC PANEL  URINALYSIS, ROUTINE W REFLEX MICROSCOPIC    Imaging Review No results found.   EKG Interpretation None     4:38 PM Reassessed patient.  She reports that her nausea has improved.  She does not feel lightheaded.  She is tolerating PO liquids at  this time. MDM   Final diagnoses:  None   Patient who is currently [redacted] weeks pregnant presents today with nausea and vomiting.  Labs unremarkable.  She denies abdominal pain, pelvic pain, vaginal bleeding, or vaginal discharge.  Therefore, do not feel that any imaging is indicated at this time.  She is afebrile.  VSS.   Abdomen soft and nontender.  Nausea improved after given IVF and Reglan.  Patient able to tolerate PO liquids.  She has a follow up appointment with OB/GYN scheduled in 3 days.  Feel that the patient is stable for discharge.  Return precautions  given.    Santiago GladHeather Marcoantonio Legault, PA-C 11/15/14 1746  Linwood DibblesJon Knapp, MD 11/16/14 [id number]

## 2014-11-15 NOTE — ED Notes (Signed)
Nurse starting IV 

## 2014-11-15 NOTE — ED Notes (Signed)
Requested urine. Patient given ginger ale.

## 2014-11-18 ENCOUNTER — Ambulatory Visit (HOSPITAL_COMMUNITY)
Admission: RE | Admit: 2014-11-18 | Discharge: 2014-11-18 | Disposition: A | Discharge status: Home / Self Care | Payer: Medicaid Other | Source: Ambulatory Visit | Attending: Family Medicine | Admitting: Family Medicine

## 2014-11-18 DIAGNOSIS — Z3A01 Less than 8 weeks gestation of pregnancy: Secondary | ICD-10-CM | POA: Insufficient documentation

## 2014-11-18 DIAGNOSIS — O208 Other hemorrhage in early pregnancy: Secondary | ICD-10-CM | POA: Diagnosis not present

## 2014-11-18 DIAGNOSIS — Z36 Encounter for antenatal screening of mother: Secondary | ICD-10-CM | POA: Insufficient documentation

## 2014-11-18 DIAGNOSIS — Z3490 Encounter for supervision of normal pregnancy, unspecified, unspecified trimester: Secondary | ICD-10-CM

## 2014-11-19 ENCOUNTER — Telehealth: Payer: Self-pay | Admitting: General Practice

## 2014-11-19 DIAGNOSIS — Z3481 Encounter for supervision of other normal pregnancy, first trimester: Secondary | ICD-10-CM

## 2014-11-19 NOTE — Telephone Encounter (Signed)
Called patient and informed her of results and need for f/u U/S. Patient requests appointment be scheduled for a Tuesday and asked that I leave appointment on voicemail when I call back. Informed patient this would be OK. U/S scheduled for 12/16/14 0845. Called patient and informed her of appointment-- patient states son has appointment at 0940. Gave patient radiology number [phone] and advised she call to reschedule for more convenient time. Patient verbalized understanding and gratitude. No further questions or concerns.

## 2014-11-19 NOTE — Telephone Encounter (Signed)
-----   Message from Levie HeritageJacob J Stinson, DO sent at 11/19/2014 10:04 AM EDT ----- Needs repeat US in 4 weeks to evaluate subchorionic hemorrhage.

## 2014-11-19 NOTE — Telephone Encounter (Signed)
Called patient, no answer- left message stating we are trying to reach you in regards to some results and an appt, nothing urgent but please call us back at the clinics

## 2014-12-16 ENCOUNTER — Other Ambulatory Visit: Payer: Self-pay | Admitting: Family Medicine

## 2014-12-16 ENCOUNTER — Ambulatory Visit (HOSPITAL_COMMUNITY)
Admission: RE | Admit: 2014-12-16 | Discharge: 2014-12-16 | Disposition: A | Discharge status: Home / Self Care | Payer: Medicaid Other | Source: Ambulatory Visit | Attending: Family Medicine | Admitting: Family Medicine

## 2014-12-16 ENCOUNTER — Ambulatory Visit (HOSPITAL_COMMUNITY): Payer: Medicaid Other

## 2014-12-16 DIAGNOSIS — O208 Other hemorrhage in early pregnancy: Secondary | ICD-10-CM | POA: Insufficient documentation

## 2014-12-16 DIAGNOSIS — Z3A11 11 weeks gestation of pregnancy: Secondary | ICD-10-CM | POA: Diagnosis not present

## 2014-12-16 DIAGNOSIS — Z3481 Encounter for supervision of other normal pregnancy, first trimester: Secondary | ICD-10-CM

## 2014-12-24 ENCOUNTER — Encounter (HOSPITAL_COMMUNITY): Payer: Self-pay

## 2014-12-24 ENCOUNTER — Inpatient Hospital Stay (HOSPITAL_COMMUNITY)
Admission: AD | Admit: 2014-12-24 | Discharge: 2014-12-24 | Disposition: A | Discharge status: Home / Self Care | Payer: Medicaid Other | Source: Ambulatory Visit | Attending: Obstetrics and Gynecology | Admitting: Obstetrics and Gynecology

## 2014-12-24 DIAGNOSIS — O23511 Infections of cervix in pregnancy, first trimester: Secondary | ICD-10-CM | POA: Diagnosis not present

## 2014-12-24 DIAGNOSIS — O9989 Other specified diseases and conditions complicating pregnancy, childbirth and the puerperium: Secondary | ICD-10-CM

## 2014-12-24 DIAGNOSIS — R109 Unspecified abdominal pain: Secondary | ICD-10-CM | POA: Diagnosis not present

## 2014-12-24 DIAGNOSIS — Z3A12 12 weeks gestation of pregnancy: Secondary | ICD-10-CM | POA: Diagnosis not present

## 2014-12-24 DIAGNOSIS — O23591 Infection of other part of genital tract in pregnancy, first trimester: Secondary | ICD-10-CM | POA: Insufficient documentation

## 2014-12-24 DIAGNOSIS — O219 Vomiting of pregnancy, unspecified: Secondary | ICD-10-CM

## 2014-12-24 DIAGNOSIS — N760 Acute vaginitis: Secondary | ICD-10-CM | POA: Diagnosis not present

## 2014-12-24 DIAGNOSIS — Z87891 Personal history of nicotine dependence: Secondary | ICD-10-CM | POA: Diagnosis not present

## 2014-12-24 DIAGNOSIS — N72 Inflammatory disease of cervix uteri: Secondary | ICD-10-CM

## 2014-12-24 DIAGNOSIS — R102 Pelvic and perineal pain: Secondary | ICD-10-CM | POA: Diagnosis present

## 2014-12-24 DIAGNOSIS — O210 Mild hyperemesis gravidarum: Secondary | ICD-10-CM | POA: Diagnosis not present

## 2014-12-24 DIAGNOSIS — O26899 Other specified pregnancy related conditions, unspecified trimester: Secondary | ICD-10-CM

## 2014-12-24 DIAGNOSIS — B9689 Other specified bacterial agents as the cause of diseases classified elsewhere: Secondary | ICD-10-CM

## 2014-12-24 LAB — WET PREP, GENITAL
Trich, Wet Prep: NONE SEEN
Yeast Wet Prep HPF POC: NONE SEEN

## 2014-12-24 LAB — URINALYSIS, ROUTINE W REFLEX MICROSCOPIC
Bilirubin Urine: NEGATIVE
Glucose, UA: NEGATIVE mg/dL
HGB URINE DIPSTICK: NEGATIVE
KETONES UR: NEGATIVE mg/dL
Nitrite: NEGATIVE
PROTEIN: NEGATIVE mg/dL
Specific Gravity, Urine: 1.02 (ref 1.005–1.030)
Urobilinogen, UA: 0.2 mg/dL (ref 0.0–1.0)
pH: 6.5 (ref 5.0–8.0)

## 2014-12-24 LAB — URINE MICROSCOPIC-ADD ON

## 2014-12-24 LAB — OB RESULTS CONSOLE GC/CHLAMYDIA: Gonorrhea: NEGATIVE

## 2014-12-24 MED ORDER — CEFTRIAXONE SODIUM 250 MG IJ SOLR
250.0000 mg | Freq: Once | INTRAMUSCULAR | Status: AC
  Administered 2014-12-24: 250 mg via INTRAMUSCULAR
  Filled 2014-12-24: qty 250

## 2014-12-24 MED ORDER — AZITHROMYCIN 250 MG PO TABS
1000.0000 mg | ORAL_TABLET | Freq: Once | ORAL | Status: AC
  Administered 2014-12-24: 1000 mg via ORAL
  Filled 2014-12-24: qty 4

## 2014-12-24 MED ORDER — METRONIDAZOLE 500 MG PO TABS: 500.0000 mg | ORAL_TABLET | Freq: Two times a day (BID) | ORAL | Status: DC

## 2014-12-24 MED ORDER — ONDANSETRON HCL 4 MG PO TABS: 4.0000 mg | ORAL_TABLET | Freq: Four times a day (QID) | ORAL | Status: DC

## 2014-12-24 MED ORDER — ONDANSETRON 8 MG PO TBDP
8.0000 mg | ORAL_TABLET | Freq: Once | ORAL | Status: AC
  Administered 2014-12-24: 8 mg via ORAL
  Filled 2014-12-24: qty 1

## 2014-12-24 NOTE — MAU Provider Note (Signed)
History     CSN: [id number]  Arrival date and time: 12/24/14 1215   None     Chief Complaint  Patient presents with  . Pelvic Pain   HPI Pt is G3P1 at 1553w0d pregnant who presents with lower abdominal sharp pain; pt is epidsodic Pt was seen on 12/16/2014 with confirmed normal US SLIUP-FHR with doppler today Pt denies vaginal bleeding.  Pt has had a milky vaginal discharge with odor. Pt denies UTI sx, fever, chills,or constipation- pt tends to have diarrhea and most medicines make her diarrhea worse Pt also has some  Nausea and would like something for nausea Pt has had difficulty with her Medicaid - assigned to sickle cell clinic- pt does not have sickle cell- pt is trying to get straightened out- cannot be seen at health dept at this time b/c pt was told they were not taking medicaid that had to be back pain   MAU Note 12/24/2014 12:28 PM    Expand All Collapse All   Onset of sharp pain in vaginal area today, about 45 minutes ago, no vaginal bleeding, vaginal discharge milky with an odor, pain is intermittent.             Past Medical History  Diagnosis Date  . Medical history non-contributory   . Gonorrhea April 2015    Past Surgical History  Procedure Laterality Date  . Rhytidectomy neck / cheek / chin    . Hand surgery      Left  hand  . Hand surgery      left   . Chin surgery      History reviewed. No pertinent family history.  History  Substance Use Topics  . Smoking status: Former Smoker    Types: Cigars    Quit date: 08/04/2013  . Smokeless tobacco: Never Used  . Alcohol Use: No    Allergies: No Known Allergies  Prescriptions prior to admission  Medication Sig Dispense Refill Last Dose  . metoCLOPramide (REGLAN) 10 MG tablet Take 1 tablet (10 mg total) by mouth every 6 (six) hours. 10 tablet 0   . Prenatal Vit-Fe Fumarate-FA (PRENATAL COMPLETE) 14-0.4 MG TABS Take 1 tablet by mouth daily.   11/14/2014 at Unknown time    Review of Systems   Constitutional: Negative for fever and chills.  HENT: Negative for hearing loss.   Gastrointestinal: Positive for nausea, abdominal pain and diarrhea. Negative for heartburn and constipation.  Genitourinary: Positive for frequency. Negative for dysuria and urgency.  Neurological: Negative for headaches.   Physical Exam   Blood pressure 111/59, pulse 80, temperature 98.3 F (36.8 C), resp. rate 16, height 5' (1.524 m), weight 189 lb (85.73 kg), last menstrual period 10/01/2014, not currently breastfeeding.  Physical Exam  Vitals reviewed. Constitutional: She is oriented to person, place, and time. She appears well-developed and well-nourished. No distress.  HENT:  Head: Normocephalic.  Eyes: Pupils are equal, round, and reactive to light.  Neck: Normal range of motion. Neck supple.  Cardiovascular: Normal rate.   Respiratory: Effort normal.  GI: Soft.  FHR 164 bpm -audible with doppler  Genitourinary:  Vagina- mod amount of creamy white discharge in vault; cervix clean- tender with uterus enlarged - tender with palpation adnexa bilateral tenderness without rebound  Musculoskeletal: Normal range of motion.  Neurological: She is alert and oriented to person, place, and time.  Skin: Skin is warm and dry.  Psychiatric: She has a normal mood and affect.    MAU Course  Procedures  Results for orders placed or performed during the hospital encounter of 12/24/14 (from the past 24 hour(s))  Urinalysis, Routine w reflex microscopic (not at [facility])     Status: Abnormal   Collection Time: 12/24/14 12:20 PM  Result Value Ref Range   Color, Urine YELLOW YELLOW   APPearance CLEAR CLEAR   Specific Gravity, Urine 1.020 1.005 - 1.030   pH 6.5 5.0 - 8.0   Glucose, UA NEGATIVE NEGATIVE mg/dL   Hgb urine dipstick NEGATIVE NEGATIVE   Bilirubin Urine NEGATIVE NEGATIVE   Ketones, ur NEGATIVE NEGATIVE mg/dL   Protein, ur NEGATIVE NEGATIVE mg/dL   Urobilinogen, UA 0.2 0.0 - 1.0 mg/dL   Nitrite  NEGATIVE NEGATIVE   Leukocytes, UA TRACE (A) NEGATIVE  Urine microscopic-add on     Status: Abnormal   Collection Time: 12/24/14 12:20 PM  Result Value Ref Range   Squamous Epithelial / LPF FEW (A) RARE   WBC, UA 0-2 <3 WBC/hpf   Results for orders placed or performed during the hospital encounter of 12/24/14 (from the past 24 hour(s))  Urinalysis, Routine w reflex microscopic (not at [facility])     Status: Abnormal   Collection Time: 12/24/14 12:20 PM  Result Value Ref Range   Color, Urine YELLOW YELLOW   APPearance CLEAR CLEAR   Specific Gravity, Urine 1.020 1.005 - 1.030   pH 6.5 5.0 - 8.0   Glucose, UA NEGATIVE NEGATIVE mg/dL   Hgb urine dipstick NEGATIVE NEGATIVE   Bilirubin Urine NEGATIVE NEGATIVE   Ketones, ur NEGATIVE NEGATIVE mg/dL   Protein, ur NEGATIVE NEGATIVE mg/dL   Urobilinogen, UA 0.2 0.0 - 1.0 mg/dL   Nitrite NEGATIVE NEGATIVE   Leukocytes, UA TRACE (A) NEGATIVE  Urine microscopic-add on     Status: Abnormal   Collection Time: 12/24/14 12:20 PM  Result Value Ref Range   Squamous Epithelial / LPF FEW (A) RARE   WBC, UA 0-2 <3 WBC/hpf  Wet prep, genital     Status: Abnormal   Collection Time: 12/24/14  1:45 PM  Result Value Ref Range   Yeast Wet Prep HPF POC NONE SEEN NONE SEEN   Trich, Wet Prep NONE SEEN NONE SEEN   Clue Cells Wet Prep HPF POC FEW (A) NONE SEEN   WBC, Wet Prep HPF POC FEW (A) NONE SEEN   Zofran  ODT given with relief of nausea and dry heaves   Assessment and Plan  Abdominal pain in pregnancy cervicits- Rocephin  and Zithromax 1 gm given in MAU Bv- flagyl  BID x 7 days Morning sickness- Zofran 4 mg tablets F/u with HD for OB care   Thai Burgueno 12/24/2014, 1:08 PM

## 2014-12-24 NOTE — MAU Note (Signed)
Onset of sharp pain in vaginal area today, about 45 minutes ago, no vaginal bleeding, vaginal discharge milky with an odor, pain is intermittent.

## 2014-12-24 NOTE — Discharge Instructions (Signed)
°  ________________________________________ ° ° ° ° °To schedule your Maternity Eligibility Appointment, please call [phone].  When you arrive for your appointment you must bring the following items or information listed below.  Your appointment will be rescheduled if you do not have these items or are 15 minutes late. °If currently receiving Medicaid, you MUST bring: °1. Medicaid Card °2. Social Security Card °3. Picture ID °4. Proof of Pregnancy °5. Verification of current address if the address on Medicaid card is incorrect "postmarked mail" °If not receiving Medicaid, you MUST bring: °1. Social Security Card °2. Picture ID °3. Birth Certificate (if available) Passport or *Green Card °4. Proof of Pregnancy °5. Verification of current address "postmarked mail" for each income presented. °6. Verification of insurance coverage, if any °7. Check stubs from each employer for the previous month (if unable to present check stub  °for each week, we will accept check stub for the first and last week ill the same month.) If you can't locate check stubs, you must bring a letter from the employer(s) and it must have the following information on letterhead, typed, in English: °o name of company °o company telephone number °o how long been with the company, if less than one month °o how much person earns per hour °o how many hours per week work °o the gross pay the person earned for the previous month °If you are 19 years old or less, you do not have to bring proof of income unless you work or live with the father of the baby and at that time we will need proof of income from you and/or the father of the baby. °Green Card recipients are eligible for Medicaid for Pregnant Women (MPW) ° °Safe Medications in Pregnancy  ° °Acne: °Benzoyl Peroxide °Salicylic Acid ° °Backache/Headache: °Tylenol: 2 regular strength every 4 hours OR °             2 Extra strength every 6 hours ° °Colds/Coughs/Allergies: °Benadryl (alcohol free)  25 mg every 6 hours as needed °Breath right strips °Claritin °Cepacol throat lozenges °Chloraseptic throat spray °Cold-Eeze- up to three times per day °Cough drops, alcohol free °Flonase (by prescription only) °Guaifenesin °Mucinex °Robitussin DM (plain only, alcohol free) °Saline nasal spray/drops °Sudafed (pseudoephedrine) & Actifed ** use only after [redacted] weeks gestation and if you do not have high blood pressure °Tylenol °Vicks Vaporub °Zinc lozenges °Zyrtec  ° °Constipation: °Colace °Ducolax suppositories °Fleet enema °Glycerin suppositories °Metamucil °Milk of magnesia °Miralax °Senokot °Smooth move tea ° °Diarrhea: °Kaopectate °Imodium A-D ° °*NO pepto Bismol ° °Hemorrhoids: °Anusol °Anusol HC °Preparation H °Tucks ° °Indigestion: °Tums °Maalox °Mylanta °Zantac  °Pepcid ° °Insomnia: °Benadryl (alcohol free) 25mg every 6 hours as needed °Tylenol PM °Unisom, no Gelcaps ° °Leg Cramps: °Tums °MagGel ° °Nausea/Vomiting:  °Bonine °Dramamine °Emetrol °Ginger extract °Sea bands °Meclizine  °Nausea medication to take during pregnancy:  °Unisom (doxylamine succinate 25 mg tablets) Take one tablet daily at bedtime. If symptoms are not adequately controlled, the dose can be increased to a maximum recommended dose of two tablets daily (1/2 tablet in the morning, 1/2 tablet mid-afternoon and one at bedtime). °Vitamin B6 100mg tablets. Take one tablet twice a day (up to 200 mg per day). ° °Skin Rashes: °Aveeno products °Benadryl cream or 25mg every 6 hours as needed °Calamine Lotion °1% cortisone cream ° °Yeast infection: °Gyne-lotrimin 7 °Monistat 7 ° ° °**If taking multiple medications, please check labels to avoid duplicating the same active ingredients °**take medication as   directed on the label °** Do not exceed 4000 mg of tylenol in 24 hours °**Do not take medications that contain aspirin or ibuprofen ° ° °

## 2014-12-25 LAB — GC/CHLAMYDIA PROBE AMP (~~LOC~~) NOT AT ARMC
Chlamydia: NEGATIVE
Neisseria Gonorrhea: NEGATIVE

## 2015-01-06 ENCOUNTER — Encounter: Payer: Medicaid Other | Admitting: Obstetrics

## 2015-01-13 ENCOUNTER — Encounter: Payer: Self-pay | Admitting: Obstetrics

## 2015-01-13 ENCOUNTER — Ambulatory Visit (INDEPENDENT_AMBULATORY_CARE_PROVIDER_SITE_OTHER): Payer: Medicaid Other | Admitting: Obstetrics

## 2015-01-13 VITALS — BP 120/73 | HR 95 | Temp 97.8°F | Wt 196.0 lb

## 2015-01-13 DIAGNOSIS — Z3482 Encounter for supervision of other normal pregnancy, second trimester: Secondary | ICD-10-CM

## 2015-01-13 DIAGNOSIS — O219 Vomiting of pregnancy, unspecified: Secondary | ICD-10-CM

## 2015-01-13 MED ORDER — FOLIC ACID 1 MG PO TABS: 1.0000 mg | ORAL_TABLET | Freq: Every day | ORAL | Status: DC

## 2015-01-13 NOTE — Progress Notes (Signed)
Subjective:    Nicole Mclean is being seen today for her first obstetrical visit.  This is not a planned pregnancy. She is at [redacted]w[redacted]d gestation. Her obstetrical history is significant for nausea and vomiting. Relationship with FOB: significant other, not living together. Patient does intend to breast feed. Pregnancy history fully reviewed.  The information documented in the HPI was reviewed and verified.  Menstrual History: OB History    Gravida Para Term Preterm AB TAB SAB Ectopic Multiple Living   0 1 0 1 0 0 1       Patient's last menstrual period was 10/01/2014.    Past Medical History  Diagnosis Date  . Medical history non-contributory   . Gonorrhea April 2015    Past Surgical History  Procedure Laterality Date  . Rhytidectomy neck / cheek / chin    . Hand surgery      Left  hand  . Hand surgery      left   . Chin surgery       (Not in a hospital admission) No Known Allergies  History  Substance Use Topics  . Smoking status: Former Smoker    Types: Cigars    Quit date: 08/04/2013  . Smokeless tobacco: Never Used  . Alcohol Use: No    History reviewed. No pertinent family history.   Review of Systems Constitutional: negative for weight loss Gastrointestinal: negative for vomiting Genitourinary:negative for genital lesions and vaginal discharge and dysuria Musculoskeletal:negative for back pain Behavioral/Psych: negative for abusive relationship, depression, illegal drug usage and tobacco use    Objective:    BP 120/73 mmHg  Pulse 95  Temp(Src) 97.8 F (36.6 C)  Wt 196 lb (88.905 kg)  LMP 10/01/2014 General Appearance:    Alert, cooperative, no distress, appears stated age  Head:    Normocephalic, without obvious abnormality, atraumatic  Eyes:    PERRL, conjunctiva/corneas clear, EOM's intact, fundi    benign, both eyes  Ears:    Normal TM's and external ear canals, both ears  Nose:   Nares normal, septum midline, mucosa normal, no drainage    or  sinus tenderness  Throat:   Lips, mucosa, and tongue normal; teeth and gums normal  Neck:   Supple, symmetrical, trachea midline, no adenopathy;    thyroid:  no enlargement/tenderness/nodules; no carotid   bruit or JVD  Back:     Symmetric, no curvature, ROM normal, no CVA tenderness  Lungs:     Clear to auscultation bilaterally, respirations unlabored  Chest Wall:    No tenderness or deformity   Heart:    Regular rate and rhythm, S1 and S2 normal, no murmur, rub   or gallop  Breast Exam:    No tenderness, masses, or nipple abnormality  Abdomen:     Soft, non-tender, bowel sounds active all four quadrants,    no masses, no organomegaly  Genitalia:    Normal female without lesion, discharge or tenderness  Extremities:   Extremities normal, atraumatic, no cyanosis or edema  Pulses:   2+ and symmetric all extremities  Skin:   Skin color, texture, turgor normal, no rashes or lesions  Lymph nodes:   Cervical, supraclavicular, and axillary nodes normal  Neurologic:   CNII-XII intact, normal strength, sensation and reflexes    throughout      Lab Review Urine pregnancy test Labs reviewed yes Radiologic studies reviewed yes Assessment:    Pregnancy at [redacted]w[redacted]d weeks    Hyperemesis.  Plan:      Dietary changes recommended.  Stop PNV.  Folic acid Rx. Counseling provided regarding continued use of seat belts, cessation of alcohol consumption, smoking or use of illicit drugs; infection precautions i.e., influenza/TDAP immunizations, toxoplasmosis,CMV, parvovirus, listeria and varicella; workplace safety, exercise during pregnancy; routine dental care, safe medications, sexual activity, hot tubs, saunas, pools, travel, caffeine use, fish and methlymercury, potential toxins, hair treatments, varicose veins Weight gain recommendations per IOM guidelines reviewed: underweight/BMI< 18.5--> gain 28 - 40 lbs; normal weight/BMI 18.5 - 24.9--> gain 25 - 35 lbs; overweight/BMI 25 - 29.9--> gain 15 - 25  lbs; obese/BMI >30->gain  11 - 20 lbs Problem list reviewed and updated. FIRST/CF mutation testing/NIPT/QUAD SCREEN/fragile X/Ashkenazi Jewish population testing/Spinal muscular atrophy discussed: requested. Role of ultrasound in pregnancy discussed; fetal survey: requested. Amniocentesis discussed: not indicated. VBAC calculator score: VBAC consent form provided Meds ordered this encounter  Medications  . Prenatal Vit-Fe Fumarate-FA (MULTIVITAMIN-PRENATAL) 27-0.8 MG TABS tablet    Sig: Take 1 tablet by mouth daily at 12 noon.  . folic acid (FOLVITE) 1 MG tablet    Sig: Take 1 tablet (1 mg total) by mouth daily.    Dispense:  30 tablet    Refill:  5   Orders Placed This Encounter  Procedures  . Culture, OB Urine  . Obstetric panel  . Hemoglobinopathy evaluation  . Varicella zoster antibody, IgG  . Vit D  25 hydroxy (rtn osteoporosis monitoring)    Follow up in 4 weeks.

## 2015-01-13 NOTE — Patient Instructions (Signed)
AFP Maternal This is a routine screen (tests) used to check for fetal abnormalities such as Down syndrome and neural tube defects. Down syndrome is a chromosomal abnormality, sometimes called Trisomy 21. Neural tube defects are serious birth defects. The brain, spinal cord, or their coverings do not develop completely. Women should be tested in the 15th to 20th week of pregnancy. The msAFP screen involves three or four tests that measure substances found in the blood that make the testing better. During development, AFP levels in fetal blood and amniotic fluid rise until about 12 weeks. The levels then gradually fall until birth. AFP is a protein produce by fetal tissue. AFP crosses the placenta and appears in the maternal blood. A baby with an open neural tube defect has an opening in its spine, head, or abdominal wall that allows higher than usual amounts of AFP to pass into the mother's blood. If a screen is positive, more tests are needed to make a diagnosis. These include ultrasound and perhaps amniocentesis (checking the fluid that surrounds the baby). These tests are used to help women and their caregivers make decisions about the management of their pregnancies. In pregnancies where the fetus is carrying the chromosomal defect that results in Down syndrome, the levels of AFP and unconjugated estriol tend to be low and hCG and inhibin A levels high.  PREPARATION FOR TEST Blood is drawn from a vein in your arm usually between the 15th and 20th weeks of pregnancy. Four different tests on your blood are done. These are AFP, hCG, unconjugated estriol, and inhibin A. The combination of tests produces a more accurate result. NORMAL FINDINGS   Adult: less than 40 ng/mL or less than 40 mg/L (SI units).  Child younger than 1 year: less than 30 ng/mL. Ranges are stratified by weeks of gestation and vary among laboratories. Ranges for normal findings may vary among different laboratories and hospitals. You  should always check with your doctor after having lab work or other tests done to discuss the meaning of your test results and whether your values are considered within normal limits. MEANING OF TEST  These are screening tests. Not all fetal abnormalities will give positive test results. Of all women who have positive AFP screening results, only a very small number of them have babies who actually have a neural tube defect or chromosomal abnormality. Your caregiver will go over the test results with you and discuss the importance and meaning of your results, as well as treatment options and the need for additional tests if necessary. OBTAINING THE TEST RESULTS It is your responsibility to obtain your test results. Ask the lab or department performing the test when and how you will get your results. Document Released: 07/26/2004 Document Revised: 11/18/2013 Document Reviewed: 06/07/2008 [employer] Patient Information 2015 [employer]. This information is not intended to replace advice given to you by your health care provider. Make sure you discuss any questions you have with your health care provider.  

## 2015-01-14 LAB — OBSTETRIC PANEL
ANTIBODY SCREEN: NEGATIVE
BASOS ABS: 0 10*3/uL (ref 0.0–0.1)
BASOS PCT: 0 % (ref 0–1)
Eosinophils Absolute: 0.1 10*3/uL (ref 0.0–0.7)
Eosinophils Relative: 1 % (ref 0–5)
HEMATOCRIT: 34.5 % — AB (ref 36.0–46.0)
HEMOGLOBIN: 11.7 g/dL — AB (ref 12.0–15.0)
HEP B S AG: NEGATIVE
Lymphocytes Relative: 25 % (ref 12–46)
Lymphs Abs: 2.4 10*3/uL (ref 0.7–4.0)
MCH: 29.5 pg (ref 26.0–34.0)
MCHC: 33.9 g/dL (ref 30.0–36.0)
MCV: 87.1 fL (ref 78.0–100.0)
MONOS PCT: 5 % (ref 3–12)
MPV: 9.6 fL (ref 8.6–12.4)
Monocytes Absolute: 0.5 10*3/uL (ref 0.1–1.0)
NEUTROS ABS: 6.6 10*3/uL (ref 1.7–7.7)
Neutrophils Relative %: 69 % (ref 43–77)
Platelets: 284 10*3/uL (ref 150–400)
RBC: 3.96 MIL/uL (ref 3.87–5.11)
RDW: 14.8 % (ref 11.5–15.5)
RH TYPE: POSITIVE
Rubella: 6.39 Index — ABNORMAL HIGH (ref <0.90)
WBC: 9.5 10*3/uL (ref 4.0–10.5)

## 2015-01-14 LAB — VARICELLA ZOSTER ANTIBODY, IGG

## 2015-01-14 LAB — VITAMIN D 25 HYDROXY (VIT D DEFICIENCY, FRACTURES): VIT D 25 HYDROXY: 21 ng/mL — AB (ref 30–100)

## 2015-01-14 LAB — CULTURE, OB URINE
Colony Count: NO GROWTH
Organism ID, Bacteria: NO GROWTH

## 2015-01-15 LAB — HEMOGLOBINOPATHY EVALUATION
HGB A2 QUANT: 2.5 % (ref 2.2–3.2)
HGB A: 97.5 % (ref 96.8–97.8)
Hemoglobin Other: 0 %
Hgb F Quant: 0 % (ref 0.0–2.0)
Hgb S Quant: 0 %

## 2015-01-20 ENCOUNTER — Other Ambulatory Visit: Payer: Self-pay | Admitting: Certified Nurse Midwife

## 2015-01-27 ENCOUNTER — Ambulatory Visit (INDEPENDENT_AMBULATORY_CARE_PROVIDER_SITE_OTHER): Payer: Medicaid Other | Admitting: Obstetrics

## 2015-01-27 ENCOUNTER — Encounter: Payer: Self-pay | Admitting: Obstetrics

## 2015-01-27 VITALS — BP 112/72 | HR 74 | Temp 97.5°F | Wt 198.2 lb

## 2015-01-27 DIAGNOSIS — Z3492 Encounter for supervision of normal pregnancy, unspecified, second trimester: Secondary | ICD-10-CM

## 2015-01-27 LAB — POCT URINALYSIS DIPSTICK
Bilirubin, UA: NEGATIVE
Blood, UA: NEGATIVE
LEUKOCYTES UA: NEGATIVE
Nitrite, UA: NEGATIVE
PH UA: 6
PROTEIN UA: NEGATIVE
SPEC GRAV UA: 1.02
UROBILINOGEN UA: NEGATIVE

## 2015-01-27 NOTE — Progress Notes (Signed)
  Subjective:    Catlynn E Para MarchDuncan is a 19 y.o. female being seen today for her obstetrical visit. She is at 6964w6d gestation. Patient reports: backache.  Problem List Items Addressed This Visit    None    Visit Diagnoses    Prenatal care, second trimester    -  Primary    Relevant Orders    POCT urinalysis dipstick      Patient Active Problem List   Diagnosis Date Noted  . Supervision of other normal pregnancy 06/07/2014  . Ruptured, membranes, premature 06/06/2014    Objective:     BP 112/72 mmHg  Pulse 74  Temp(Src) 97.5 F (36.4 C)  Wt 198 lb 3.2 oz (89.903 kg)  LMP 10/01/2014 Uterine Size: Below umbilicus     Assessment:    Pregnancy @ 4764w6d  weeks Doing well    Plan:    Problem list reviewed and updated. Labs reviewed.  Follow up in 4 weeks. FIRST/CF mutation testing/NIPT/QUAD SCREEN/fragile X/Ashkenazi Jewish population testing/Spinal muscular atrophy discussed: requested. Role of ultrasound in pregnancy discussed; fetal survey: requested. Amniocentesis discussed: not indicated.

## 2015-01-29 LAB — AFP, QUAD SCREEN
AFP: 14.4 ng/mL
CURR GEST AGE: 17.1 wks.days
Down Syndrome Scr Risk Est: 1:1700 {titer}
HCG, Total: 11.86 IU/mL
INH: 168.6 pg/mL
Interpretation-AFP: NEGATIVE
MOM FOR HCG: 0.43
MOM FOR INH: 1.14
MoM for AFP: 0.45
Open Spina bifida: NEGATIVE
Tri 18 Scr Risk Est: NEGATIVE
Trisomy 18 (Edward) Syndrome Interp.: 1:1790 {titer}
uE3 Mom: 0.71
uE3 Value: 0.73 ng/mL

## 2015-02-05 ENCOUNTER — Telehealth: Payer: Self-pay | Admitting: *Deleted

## 2015-02-05 NOTE — Telephone Encounter (Signed)
Patient states she is having some pain in her lower abdomen. Patient states it does not feel like contractions. Patient states there is some sensitivity to her lower abdomen. Patient states she has a maternity belt and she has been wearing it. Patient has been scheduled for an appointment for 02/06/15 @ 2:30 pm

## 2015-02-06 ENCOUNTER — Ambulatory Visit (INDEPENDENT_AMBULATORY_CARE_PROVIDER_SITE_OTHER): Payer: Medicaid Other | Admitting: Certified Nurse Midwife

## 2015-02-06 VITALS — BP 119/72 | HR 85 | Temp 97.7°F | Wt 199.6 lb

## 2015-02-06 DIAGNOSIS — Z3482 Encounter for supervision of other normal pregnancy, second trimester: Secondary | ICD-10-CM | POA: Diagnosis not present

## 2015-02-06 LAB — POCT URINALYSIS DIPSTICK
Bilirubin, UA: NEGATIVE
Glucose, UA: NEGATIVE
Ketones, UA: NEGATIVE
LEUKOCYTES UA: NEGATIVE
Nitrite, UA: NEGATIVE
Protein, UA: NEGATIVE
RBC UA: NEGATIVE
Spec Grav, UA: 1.01
Urobilinogen, UA: NEGATIVE
pH, UA: 6

## 2015-02-06 NOTE — Progress Notes (Signed)
  Subjective:    Nicole Mclean is a 19 y.o. female being seen today for her obstetrical visit. She is at [redacted]w[redacted]d gestation. Patient reports: bleeding, no contractions, no cramping, no leaking and round ligament pain that comes and goes, has abdominal maternity support belt that she wears during the day.  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  Denies any recent sexual activity.  States that she had some spotting on Monday and Tuesday, none today.  She also states that she can feel fetal movement occasionally.  She is at high risk for close spaced pregnancies.    Problem List Items Addressed This Visit    None    Visit Diagnoses    Encounter for supervision of other normal pregnancy in second trimester    -  Primary    Relevant Orders    POCT urinalysis dipstick (Completed)    US OB Comp + 14 Wk      Patient Active Problem List   Diagnosis Date Noted  . Supervision of other normal pregnancy 06/07/2014  . Ruptured, membranes, premature 06/06/2014    Objective:     BP 119/72 mmHg  Pulse 85  Temp(Src) 97.7 F (36.5 C)  Wt 199 lb 9.6 oz (90.538 kg)  LMP 10/01/2014 Uterine Size: Below umbilicus   FHR by doppler: 150's  Assessment:    Pregnancy @ [redacted]w[redacted]d  weeks ? Origin of vaginal spotting ?previa Round ligament pain     [facility]   [place] [facility]    Problem list reviewed and updated. Labs reviewed. Follow up in 3 weeks. FIRST/CF mutation testing/NIPT/QUAD SCREEN/fragile X/Ashkenazi Jewish population testing/Spinal muscular atrophy discussed: results reviewed. Role of ultrasound in pregnancy discussed; fetal survey: requested. Amniocentesis discussed: not indicated. 50% of 15 minute visit spent on counseling and coordination of care.

## 2015-02-09 ENCOUNTER — Ambulatory Visit (HOSPITAL_COMMUNITY)
Admission: RE | Admit: 2015-02-09 | Discharge: 2015-02-09 | Disposition: A | Discharge status: Home / Self Care | Payer: Medicaid Other | Source: Ambulatory Visit | Attending: Certified Nurse Midwife | Admitting: Certified Nurse Midwife

## 2015-02-09 DIAGNOSIS — Z3482 Encounter for supervision of other normal pregnancy, second trimester: Secondary | ICD-10-CM | POA: Diagnosis not present

## 2015-02-09 DIAGNOSIS — Z3689 Encounter for other specified antenatal screening: Secondary | ICD-10-CM | POA: Insufficient documentation

## 2015-02-09 DIAGNOSIS — Z3A18 18 weeks gestation of pregnancy: Secondary | ICD-10-CM | POA: Insufficient documentation

## 2015-02-10 ENCOUNTER — Other Ambulatory Visit: Payer: Self-pay | Admitting: Certified Nurse Midwife

## 2015-02-13 ENCOUNTER — Other Ambulatory Visit: Payer: Self-pay | Admitting: Certified Nurse Midwife

## 2015-02-19 ENCOUNTER — Other Ambulatory Visit: Payer: Self-pay

## 2015-02-19 ENCOUNTER — Ambulatory Visit (INDEPENDENT_AMBULATORY_CARE_PROVIDER_SITE_OTHER): Payer: Medicaid Other | Admitting: Obstetrics

## 2015-02-19 ENCOUNTER — Encounter: Payer: Self-pay | Admitting: Obstetrics

## 2015-02-19 VITALS — BP 116/67 | HR 92 | Temp 98.2°F | Wt 198.4 lb

## 2015-02-19 DIAGNOSIS — Z3482 Encounter for supervision of other normal pregnancy, second trimester: Secondary | ICD-10-CM

## 2015-02-19 LAB — POCT URINALYSIS DIPSTICK
Bilirubin, UA: NEGATIVE
Blood, UA: NEGATIVE
GLUCOSE UA: NEGATIVE
Ketones, UA: NEGATIVE
Leukocytes, UA: NEGATIVE
Nitrite, UA: NEGATIVE
PROTEIN UA: NEGATIVE
Spec Grav, UA: 1.02
UROBILINOGEN UA: NEGATIVE
pH, UA: 6

## 2015-02-19 NOTE — Progress Notes (Signed)
Subjective:    Nicole Mclean is a 19 y.o. female being seen today for her obstetrical visit. She is at [redacted]w[redacted]d gestation. Patient reports: no complaints . Fetal movement: normal.  Problem List Items Addressed This Visit    Supervision of other normal pregnancy - Primary   Relevant Orders   POCT urinalysis dipstick (Completed)     Patient Active Problem List   Diagnosis Date Noted  . Encounter for fetal anatomic survey   . [redacted] weeks gestation of pregnancy   . Supervision of other normal pregnancy 06/07/2014  . Ruptured, membranes, premature 06/06/2014   Objective:    BP 116/67 mmHg  Pulse 92  Temp(Src) 98.2 F (36.8 C)  Wt 198 lb 6.4 oz (89.994 kg)  LMP 10/01/2014 FHT: 150 BPM  Uterine Size: size equals dates     Assessment:    Pregnancy @ [redacted]w[redacted]d    Plan:    OBGCT: discussed.  Labs, problem list reviewed and updated 2 hr GTT planned Follow up in 4 weeks.

## 2015-03-16 ENCOUNTER — Encounter (HOSPITAL_COMMUNITY): Payer: Self-pay | Admitting: *Deleted

## 2015-03-16 ENCOUNTER — Inpatient Hospital Stay (HOSPITAL_COMMUNITY)
Admission: AD | Admit: 2015-03-16 | Discharge: 2015-03-16 | Disposition: A | Discharge status: Home / Self Care | Payer: Medicaid Other | Source: Ambulatory Visit | Attending: Obstetrics | Admitting: Obstetrics

## 2015-03-16 DIAGNOSIS — Z3A23 23 weeks gestation of pregnancy: Secondary | ICD-10-CM | POA: Diagnosis not present

## 2015-03-16 DIAGNOSIS — Z87891 Personal history of nicotine dependence: Secondary | ICD-10-CM | POA: Diagnosis not present

## 2015-03-16 DIAGNOSIS — R109 Unspecified abdominal pain: Secondary | ICD-10-CM | POA: Diagnosis not present

## 2015-03-16 DIAGNOSIS — O9989 Other specified diseases and conditions complicating pregnancy, childbirth and the puerperium: Secondary | ICD-10-CM

## 2015-03-16 DIAGNOSIS — O26899 Other specified pregnancy related conditions, unspecified trimester: Secondary | ICD-10-CM

## 2015-03-16 LAB — COMPREHENSIVE METABOLIC PANEL
ALK PHOS: 91 U/L (ref 38–126)
ALT: 10 U/L — ABNORMAL LOW (ref 14–54)
AST: 12 U/L — ABNORMAL LOW (ref 15–41)
Albumin: 3.2 g/dL — ABNORMAL LOW (ref 3.5–5.0)
Anion gap: 8 (ref 5–15)
BILIRUBIN TOTAL: 0.5 mg/dL (ref 0.3–1.2)
BUN: 6 mg/dL (ref 6–20)
CALCIUM: 8.8 mg/dL — AB (ref 8.9–10.3)
CO2: 24 mmol/L (ref 22–32)
Chloride: 104 mmol/L (ref 101–111)
Creatinine, Ser: 0.53 mg/dL (ref 0.44–1.00)
GFR calc Af Amer: >60 mL/min (ref >60)
GLUCOSE: 89 mg/dL (ref 65–99)
POTASSIUM: 3.7 mmol/L (ref 3.5–5.1)
Sodium: 136 mmol/L (ref 135–145)
TOTAL PROTEIN: 6.7 g/dL (ref 6.5–8.1)

## 2015-03-16 LAB — CBC WITH DIFFERENTIAL/PLATELET
Basophils Absolute: 0 10*3/uL (ref 0.0–0.1)
Basophils Relative: 0 % (ref 0–1)
Eosinophils Absolute: 0.1 10*3/uL (ref 0.0–0.7)
Eosinophils Relative: 1 % (ref 0–5)
HEMATOCRIT: 31.8 % — AB (ref 36.0–46.0)
HEMOGLOBIN: 10.7 g/dL — AB (ref 12.0–15.0)
LYMPHS PCT: 23 % (ref 12–46)
Lymphs Abs: 2.3 10*3/uL (ref 0.7–4.0)
MCH: 30 pg (ref 26.0–34.0)
MCHC: 33.6 g/dL (ref 30.0–36.0)
MCV: 89.1 fL (ref 78.0–100.0)
MONO ABS: 0.5 10*3/uL (ref 0.1–1.0)
MONOS PCT: 5 % (ref 3–12)
NEUTROS ABS: 7.1 10*3/uL (ref 1.7–7.7)
Neutrophils Relative %: 71 % (ref 43–77)
Platelets: 244 10*3/uL (ref 150–400)
RBC: 3.57 MIL/uL — ABNORMAL LOW (ref 3.87–5.11)
RDW: 13.1 % (ref 11.5–15.5)
WBC: 9.9 10*3/uL (ref 4.0–10.5)

## 2015-03-16 LAB — URINALYSIS, ROUTINE W REFLEX MICROSCOPIC
Bilirubin Urine: NEGATIVE
GLUCOSE, UA: NEGATIVE mg/dL
Hgb urine dipstick: NEGATIVE
Ketones, ur: NEGATIVE mg/dL
Leukocytes, UA: NEGATIVE
Nitrite: NEGATIVE
Protein, ur: NEGATIVE mg/dL
SPECIFIC GRAVITY, URINE: 1.015 (ref 1.005–1.030)
Urobilinogen, UA: 0.2 mg/dL (ref 0.0–1.0)
pH: 6.5 (ref 5.0–8.0)

## 2015-03-16 LAB — LIPASE, BLOOD: Lipase: 12 U/L — ABNORMAL LOW (ref 22–51)

## 2015-03-16 LAB — AMYLASE: AMYLASE: 42 U/L (ref 28–100)

## 2015-03-16 NOTE — Discharge Instructions (Signed)

## 2015-03-16 NOTE — MAU Provider Note (Signed)
History     CSN: [id number]  Arrival date and time: 03/16/15 1118      First Provider Initiated Contact with Patient 03/16/15 1157        Chief Complaint  Patient presents with  . Abdominal Pain  . Back Pain   HPI Nicole Mclean is a 19 y.o. G3P1011 at [redacted]w[redacted]d who presents with abdominal pain. Mid to upper abdominal pain started last night. Pain is described as sore and wraps around to her back. Feels like abdomen tightens when she has the pain. Pain worsens with with movement. Rates as 7/10 when it comes. No treatment for pain.  Denies vaginal bleeding, vaginal discharge, or dysuria.  Positive feteal movement.  No intercourse in 2 weeks.   Past Medical History  Diagnosis Date  . Gonorrhea April 2015    Past Surgical History  Procedure Laterality Date  . Rhytidectomy neck / cheek / chin    . Hand surgery      Left  hand  . Hand surgery      left   . Chin surgery      History reviewed. No pertinent family history.  Social History  Substance Use Topics  . Smoking status: Former Smoker    Types: Cigars    Quit date: 08/04/2013  . Smokeless tobacco: Never Used  . Alcohol Use: No    Allergies: No Known Allergies  Prescriptions prior to admission  Medication Sig Dispense Refill Last Dose  . folic acid (FOLVITE) 1 MG tablet Take 1 tablet (1 mg total) by mouth daily. 30 tablet 5 Taking  . metroNIDAZOLE (FLAGYL) 500 MG tablet Take 1 tablet (500 mg total) by mouth 2 (two) times daily. (Patient not taking: Reported on 01/27/2015) 14 tablet 0 Not Taking  . ondansetron (ZOFRAN) 4 MG tablet Take 1 tablet (4 mg total) by mouth every 6 (six) hours. (Patient not taking: Reported on 01/13/2015) 30 tablet 0 Not Taking  . Prenatal Vit-Fe Fumarate-FA (MULTIVITAMIN-PRENATAL) 27-0.8 MG TABS tablet Take 1 tablet by mouth daily at 12 noon.   Not Taking    Review of Systems  Constitutional: Negative.   Gastrointestinal: Positive for nausea and abdominal pain. Negative for heartburn,  vomiting, diarrhea and constipation.  Genitourinary: Negative.  Negative for dysuria.       No vaginal bleeding No vaginal discharge   Physical Exam   Blood pressure 115/51, pulse 86, temperature 98.7 F (37.1 C), temperature source Oral, resp. rate 18, last menstrual period 10/01/2014, SpO2 100 %, not currently breastfeeding.  Physical Exam  Nursing note and vitals reviewed. Constitutional: She is oriented to person, place, and time. She appears well-developed and well-nourished. No distress.  HENT:  Head: Normocephalic and atraumatic.  Eyes: Conjunctivae are normal. Right eye exhibits no discharge. Left eye exhibits no discharge. No scleral icterus.  Neck: Normal range of motion.  Cardiovascular: Normal rate, regular rhythm and normal heart sounds.   No murmur heard. Respiratory: Effort normal and breath sounds normal. No respiratory distress. She has no wheezes.  GI: Soft. Bowel sounds are normal. She exhibits distension (apprpriate for gestation). There is no tenderness.  Genitourinary: Vagina normal.  SVE 0/0/-3  Neurological: She is alert and oriented to person, place, and time.  Skin: Skin is warm and dry. She is not diaphoretic.  Psychiatric: She has a normal mood and affect. Her behavior is normal. Judgment and thought content normal.   Dilation: Closed Effacement (%): Thick Exam by:: Estanislado Spire, NP  Fetal Tracing:  Baseline:  145 Variability: moderate Accelerations: none Decelerations: few small variables  Toco: irregular UI    MAU Course  Procedures Results for orders placed or performed during the hospital encounter of 03/16/15 (from the past 24 hour(s))  Urinalysis, Routine w reflex microscopic (not at [facility])     Status: None   Collection Time: 03/16/15 11:25 AM  Result Value Ref Range   Color, Urine YELLOW YELLOW   APPearance CLEAR CLEAR   Specific Gravity, Urine 1.015 1.005 - 1.030   pH 6.5 5.0 - 8.0   Glucose, UA NEGATIVE NEGATIVE mg/dL   Hgb urine  dipstick NEGATIVE NEGATIVE   Bilirubin Urine NEGATIVE NEGATIVE   Ketones, ur NEGATIVE NEGATIVE mg/dL   Protein, ur NEGATIVE NEGATIVE mg/dL   Urobilinogen, UA 0.2 0.0 - 1.0 mg/dL   Nitrite NEGATIVE NEGATIVE   Leukocytes, UA NEGATIVE NEGATIVE  CBC with Differential     Status: Abnormal   Collection Time: 03/16/15 12:08 PM  Result Value Ref Range   WBC 9.9 4.0 - 10.5 K/uL   RBC 3.57 (L) 3.87 - 5.11 MIL/uL   Hemoglobin 10.7 (L) 12.0 - 15.0 g/dL   HCT 16.1 (L) 09.6 - 04.5 %   MCV 89.1 78.0 - 100.0 fL   MCH 30.0 26.0 - 34.0 pg   MCHC 33.6 30.0 - 36.0 g/dL   RDW 40.9 81.1 - 91.4 %   Platelets 244 150 - 400 K/uL   Neutrophils Relative % 71 43 - 77 %   Neutro Abs 7.1 1.7 - 7.7 K/uL   Lymphocytes Relative 23 12 - 46 %   Lymphs Abs 2.3 0.7 - 4.0 K/uL   Monocytes Relative 5 3 - 12 %   Monocytes Absolute 0.5 0.1 - 1.0 K/uL   Eosinophils Relative 1 0 - 5 %   Eosinophils Absolute 0.1 0.0 - 0.7 K/uL   Basophils Relative 0 0 - 1 %   Basophils Absolute 0.0 0.0 - 0.1 K/uL  Comprehensive metabolic panel     Status: Abnormal   Collection Time: 03/16/15 12:08 PM  Result Value Ref Range   Sodium 136 135 - 145 mmol/L   Potassium 3.7 3.5 - 5.1 mmol/L   Chloride 104 101 - 111 mmol/L   CO2 24 22 - 32 mmol/L   Glucose, Bld 89 65 - 99 mg/dL   BUN 6 6 - 20 mg/dL   Creatinine, Ser 7.82 0.44 - 1.00 mg/dL   Calcium 8.8 (L) 8.9 - 10.3 mg/dL   Total Protein 6.7 6.5 - 8.1 g/dL   Albumin 3.2 (L) 3.5 - 5.0 g/dL   AST 12 (L) 15 - 41 U/L   ALT 10 (L) 14 - 54 U/L   Alkaline Phosphatase 91 38 - 126 U/L   Total Bilirubin 0.5 0.3 - 1.2 mg/dL   GFR calc non Af Amer >60 >60 mL/min   GFR calc Af Amer >60 >60 mL/min   Anion gap 8 5 - 15  Amylase     Status: None   Collection Time: 03/16/15 12:08 PM  Result Value Ref Range   Amylase 42 28 - 100 U/L  Lipase, blood     Status: Abnormal   Collection Time: 03/16/15 12:08 PM  Result Value Ref Range   Lipase 12 (L) 22 - 51 U/L     MDM CBC/CMP/amylase/lipase Declines pain medication or nausea medication Cervix closed. No contractions on monitor.  Reassuring tracing for gestation age Assessment and Plan  A: 1. Abdominal pain in pregnancy    P:  Discharge in stable condition Keep schedule prenatal appt with [facility] on 9/1 Discussed reasons to return to MAU  Judeth Horn, NP  03/16/2015, 11:57 AM

## 2015-03-16 NOTE — MAU Note (Signed)
Pt state upper abd pain which radiates to back started last night about 2200.  States pain feels like bruising when she touches the area.  Good fetal movement.  Denies vaginal bleeding, abnormal discharge, or ROM.

## 2015-03-19 ENCOUNTER — Ambulatory Visit (INDEPENDENT_AMBULATORY_CARE_PROVIDER_SITE_OTHER): Payer: Medicaid Other | Admitting: Obstetrics

## 2015-03-19 ENCOUNTER — Encounter: Payer: Self-pay | Admitting: Obstetrics

## 2015-03-19 VITALS — BP 115/73 | HR 83 | Temp 98.7°F | Wt 204.0 lb

## 2015-03-19 DIAGNOSIS — Z3482 Encounter for supervision of other normal pregnancy, second trimester: Secondary | ICD-10-CM

## 2015-03-19 LAB — POCT URINALYSIS DIPSTICK
BILIRUBIN UA: NEGATIVE
Blood, UA: NEGATIVE
GLUCOSE UA: NORMAL
KETONES UA: NEGATIVE
Leukocytes, UA: NEGATIVE
Nitrite, UA: NEGATIVE
Protein, UA: NEGATIVE
UROBILINOGEN UA: NEGATIVE
pH, UA: 7

## 2015-03-19 NOTE — Progress Notes (Signed)
Subjective:    Nicole Mclean is a 19 y.o. female being seen today for her obstetrical visit. She is at [redacted]w[redacted]d gestation. Patient reports: no complaints . Fetal movement: normal.  Problem List Items Addressed This Visit    Supervision of other normal pregnancy - Primary   Relevant Orders   POCT urinalysis dipstick (Completed)     Patient Active Problem List   Diagnosis Date Noted  . Encounter for fetal anatomic survey   . [redacted] weeks gestation of pregnancy   . Supervision of other normal pregnancy 06/07/2014  . Ruptured, membranes, premature 06/06/2014   Objective:    BP 115/73 mmHg  Pulse 83  Temp(Src) 98.7 F (37.1 C)  Wt 204 lb (92.534 kg)  LMP 10/01/2014 FHT: 150 BPM  Uterine Size: size equals dates     Assessment:    Pregnancy @ [redacted]w[redacted]d    Plan:    OBGCT: ordered.  Labs, problem list reviewed and updated 2 hr GTT planned Follow up in 4 weeks.

## 2015-04-16 ENCOUNTER — Other Ambulatory Visit: Payer: Medicaid Other

## 2015-04-16 ENCOUNTER — Ambulatory Visit (INDEPENDENT_AMBULATORY_CARE_PROVIDER_SITE_OTHER): Payer: Medicaid Other | Admitting: Obstetrics

## 2015-04-16 ENCOUNTER — Encounter: Payer: Self-pay | Admitting: Obstetrics

## 2015-04-16 VITALS — BP 113/73 | HR 101 | Temp 97.4°F | Wt 210.0 lb

## 2015-04-16 DIAGNOSIS — Z3493 Encounter for supervision of normal pregnancy, unspecified, third trimester: Secondary | ICD-10-CM

## 2015-04-16 DIAGNOSIS — Z3483 Encounter for supervision of other normal pregnancy, third trimester: Secondary | ICD-10-CM

## 2015-04-16 DIAGNOSIS — O3660X1 Maternal care for excessive fetal growth, unspecified trimester, fetus 1: Secondary | ICD-10-CM

## 2015-04-16 LAB — POCT URINALYSIS DIPSTICK
Bilirubin, UA: NEGATIVE
Blood, UA: NEGATIVE
Glucose, UA: NEGATIVE
Ketones, UA: NEGATIVE
LEUKOCYTES UA: NEGATIVE
NITRITE UA: NEGATIVE
PH UA: 6
PROTEIN UA: NEGATIVE
Spec Grav, UA: 1.015
Urobilinogen, UA: NEGATIVE

## 2015-04-16 LAB — RPR

## 2015-04-16 NOTE — Progress Notes (Signed)
Subjective:    Nicole Mclean is a 19 y.o. female being seen today for her obstetrical visit. She is at [redacted]w[redacted]d gestation. Patient reports no complaints. Fetal movement: decreased.  Problem List Items Addressed This Visit    None    Visit Diagnoses    LGA (large for gestational age) fetus affecting management of mother, unspecified trimester, fetus 1    -  Primary    Relevant Orders    US OB Comp + 14 Wk      Patient Active Problem List   Diagnosis Date Noted  . Encounter for fetal anatomic survey   . [redacted] weeks gestation of pregnancy   . Supervision of other normal pregnancy 06/07/2014  . Ruptured, membranes, premature 06/06/2014   Objective:    BP 113/73 mmHg  Pulse 101  Temp(Src) 97.4 F (36.3 C)  Wt 210 lb (95.255 kg)  LMP 10/01/2014 FHT:  140 BPM  Uterine Size: size greater than dates  Presentation: unsure     Assessment:    Pregnancy @ [redacted]w[redacted]d weeks   Plan:     labs reviewed, problem list updated Consent signed. GBS sent TDAP offered  Rhogam given for RH negative Pediatrician: discussed. Infant feeding: plans to breastfeed. Maternity leave: discussed. Cigarette smoking: former smoker. Orders Placed This Encounter  Procedures  . US OB Comp + 14 Wk    Standing Status: Future     Number of Occurrences:      Standing Expiration Date: 06/15/2016    Order Specific Question:  Reason for Exam (SYMPTOM  OR DIAGNOSIS REQUIRED)    Answer:  LGA    Order Specific Question:  Preferred imaging location?    Answer:  [facility]   No orders of the defined types were placed in this encounter.   Follow up in 2 Weeks.

## 2015-04-16 NOTE — Addendum Note (Signed)
Addended by: Blase Mess F on: 04/16/2015 11:03 AM   Modules accepted: Orders

## 2015-04-16 NOTE — Progress Notes (Signed)
Decrease in movement in past few days.

## 2015-04-16 NOTE — Addendum Note (Signed)
Addended by: FOSTER, SUZANNE D on: 04/16/2015 11:31 AM   Modules accepted: Orders  

## 2015-04-17 LAB — CBC
HCT: 35.7 % — ABNORMAL LOW (ref 36.0–46.0)
HEMOGLOBIN: 11.4 g/dL — AB (ref 12.0–15.0)
MCH: 29.3 pg (ref 26.0–34.0)
MCHC: 31.9 g/dL (ref 30.0–36.0)
MCV: 91.8 fL (ref 78.0–100.0)
MPV: 10.3 fL (ref 8.6–12.4)
Platelets: 287 10*3/uL (ref 150–400)
RBC: 3.89 MIL/uL (ref 3.87–5.11)
RDW: 13.9 % (ref 11.5–15.5)
WBC: 9.9 10*3/uL (ref 4.0–10.5)

## 2015-04-17 LAB — HIV ANTIBODY (ROUTINE TESTING W REFLEX): HIV: NONREACTIVE

## 2015-04-17 LAB — GLUCOSE TOLERANCE, 2 HOURS W/ 1HR
Glucose, 1 hour: 101 mg/dL (ref 70–170)
Glucose, 2 hour: 95 mg/dL (ref 70–139)
Glucose, Fasting: 76 mg/dL (ref 65–99)

## 2015-04-23 ENCOUNTER — Inpatient Hospital Stay (HOSPITAL_COMMUNITY)
Admission: AD | Admit: 2015-04-23 | Discharge: 2015-04-23 | Disposition: A | Discharge status: Home / Self Care | Payer: Medicaid Other | Source: Ambulatory Visit | Attending: Obstetrics | Admitting: Obstetrics

## 2015-04-23 ENCOUNTER — Encounter (HOSPITAL_COMMUNITY): Payer: Self-pay | Admitting: *Deleted

## 2015-04-23 DIAGNOSIS — Z87891 Personal history of nicotine dependence: Secondary | ICD-10-CM | POA: Diagnosis not present

## 2015-04-23 DIAGNOSIS — O23593 Infection of other part of genital tract in pregnancy, third trimester: Secondary | ICD-10-CM | POA: Insufficient documentation

## 2015-04-23 DIAGNOSIS — Z3A29 29 weeks gestation of pregnancy: Secondary | ICD-10-CM | POA: Diagnosis not present

## 2015-04-23 DIAGNOSIS — L02214 Cutaneous abscess of groin: Secondary | ICD-10-CM

## 2015-04-23 DIAGNOSIS — L02429 Furuncle of limb, unspecified: Secondary | ICD-10-CM | POA: Diagnosis present

## 2015-04-23 MED ORDER — CEPHALEXIN 500 MG PO CAPS: 500.0000 mg | ORAL_CAPSULE | Freq: Four times a day (QID) | ORAL | Status: DC

## 2015-04-23 MED ORDER — OXYCODONE-ACETAMINOPHEN 5-325 MG PO TABS
1.0000 | ORAL_TABLET | Freq: Four times a day (QID) | ORAL | Status: DC | PRN
Start: 2015-04-23 — End: 2015-05-20

## 2015-04-23 NOTE — MAU Provider Note (Signed)
  History     CSN: [id number]  Arrival date and time: 04/23/15 1503   First Provider Initiated Contact with Patient 04/23/15 1537      No chief complaint on file.  HPI Nicole Mclean is a 19 y.o. G3P1011 at [redacted]w[redacted]d who presents to MAU today with complaint of a boil on her inner thigh near her right labia. She states that this area started to bother her 2-3 days ago. She states that the area is painful with walking. She has tried sitz baths. She has not taken anything for pain. She states that the area has started to drain now. She does shave the area. She denies fever, vaginal bleeding, discharge, LOF, abdominal pain, contractions or complications with the pregnancy. She does endorse good fetal movement.   OB History    Gravida Para Term Preterm AB TAB SAB Ectopic Multiple Living   0 1 0 1 0 0 1      Past Medical History  Diagnosis Date  . Gonorrhea April 2015    Past Surgical History  Procedure Laterality Date  . Rhytidectomy neck / cheek / chin    . Hand surgery      Left  hand  . Hand surgery      left   . Chin surgery      History reviewed. No pertinent family history.  Social History  Substance Use Topics  . Smoking status: Former Smoker    Types: Cigars    Quit date: 08/04/2013  . Smokeless tobacco: Never Used  . Alcohol Use: No    Allergies: No Known Allergies  Prescriptions prior to admission  Medication Sig Dispense Refill Last Dose  . Prenatal Vit-Fe Fumarate-FA (MULTIVITAMIN-PRENATAL) 27-0.8 MG TABS tablet Take 1 tablet by mouth daily at 12 noon.   Not Taking    Review of Systems  Constitutional: Negative for fever and malaise/fatigue.  Gastrointestinal: Negative for abdominal pain.  Genitourinary:       Neg - vaginal bleeding, discharge, LOF   Physical Exam   Blood pressure 134/63, pulse 99, temperature 98.4 F (36.9 C), temperature source Oral, resp. rate 18, last menstrual period 10/01/2014, not currently breastfeeding.  Physical  Exam  Nursing note and vitals reviewed. Constitutional: She is oriented to person, place, and time. She appears well-developed and well-nourished. No distress.  HENT:  Head: Normocephalic and atraumatic.  Cardiovascular: Normal rate.   Respiratory: Effort normal.  GI: Soft.  Genitourinary:     Neurological: She is alert and oriented to person, place, and time.  Skin: Skin is warm and dry. No erythema.  Psychiatric: She has a normal mood and affect.   Fetal Monitoring: Baseline: 130 bpm, moderate variability, + accelerations, no decelerations Contractions: none  MAU Course  Procedures None  Assessment and Plan  A: SIUP at [redacted]w[redacted]d Abscess  P: Discharge home Rx for Keflex and Percocet given to patient Warning signs for worsening condition discussed Encouraged continued Sitz baths and warm compresses for drainage Patient advised to follow-up with Dr. Clearance Coots as scheduled or sooner PRN Patient may return to MAU as needed or if her condition were to change or worsen   Marny Lowenstein, PA-C  04/23/2015, 3:37 PM

## 2015-04-23 NOTE — Discharge Instructions (Signed)
Abscess °An abscess (boil or furuncle) is an infected area on or under the skin. This area is filled with yellowish-white fluid (pus) and other material (debris). °HOME CARE  °· Only take medicines as told by your doctor. °· If you were given antibiotic medicine, take it as directed. Finish the medicine even if you start to feel better. °· If gauze is used, follow your doctor's directions for changing the gauze. °· To avoid spreading the infection: °¨ Keep your abscess covered with a bandage. °¨ Wash your hands well. °¨ Do not share personal care items, towels, or whirlpools with others. °¨ Avoid skin contact with others. °· Keep your skin and clothes clean around the abscess. °· Keep all doctor visits as told. °GET HELP RIGHT AWAY IF:  °· You have more pain, puffiness (swelling), or redness in the wound site. °· You have more fluid or blood coming from the wound site. °· You have muscle aches, chills, or you feel sick. °· You have a fever. °MAKE SURE YOU:  °· Understand these instructions. °· Will watch your condition. °· Will get help right away if you are not doing well or get worse. °  °This information is not intended to replace advice given to you by your health care provider. Make sure you discuss any questions you have with your health care provider. °  °Document Released: 12/21/2007 Document Revised: 01/03/2012 Document Reviewed: 09/17/2011 °Elsevier Interactive Patient Education ©2016 Elsevier Inc. ° °

## 2015-04-23 NOTE — MAU Note (Signed)
Pt c/o a cyst on her right labia. Started developing about 2-3 days ago. Has tried cyst bathe and soaking. Is more painful today and pt noted it started bleeding and "puss" coming out. Called MD and did not get anyone to pick up.

## 2015-04-24 ENCOUNTER — Other Ambulatory Visit: Payer: Self-pay | Admitting: Obstetrics

## 2015-04-24 ENCOUNTER — Ambulatory Visit (HOSPITAL_COMMUNITY): Payer: Medicaid Other

## 2015-04-24 ENCOUNTER — Ambulatory Visit (HOSPITAL_COMMUNITY)
Admission: RE | Admit: 2015-04-24 | Discharge: 2015-04-24 | Disposition: A | Discharge status: Home / Self Care | Payer: Medicaid Other | Source: Ambulatory Visit | Attending: Obstetrics | Admitting: Obstetrics

## 2015-04-24 DIAGNOSIS — Z0489 Encounter for examination and observation for other specified reasons: Secondary | ICD-10-CM

## 2015-04-24 DIAGNOSIS — Z3A29 29 weeks gestation of pregnancy: Secondary | ICD-10-CM

## 2015-04-24 DIAGNOSIS — O3660X1 Maternal care for excessive fetal growth, unspecified trimester, fetus 1: Secondary | ICD-10-CM

## 2015-04-24 DIAGNOSIS — Z1389 Encounter for screening for other disorder: Secondary | ICD-10-CM

## 2015-04-24 DIAGNOSIS — O3663X0 Maternal care for excessive fetal growth, third trimester, not applicable or unspecified: Secondary | ICD-10-CM | POA: Diagnosis present

## 2015-04-24 DIAGNOSIS — IMO0002 Reserved for concepts with insufficient information to code with codable children: Secondary | ICD-10-CM

## 2015-04-30 ENCOUNTER — Ambulatory Visit (INDEPENDENT_AMBULATORY_CARE_PROVIDER_SITE_OTHER): Payer: Medicaid Other | Admitting: Obstetrics

## 2015-04-30 VITALS — BP 111/75 | HR 85 | Temp 98.4°F | Wt 209.0 lb

## 2015-04-30 DIAGNOSIS — D649 Anemia, unspecified: Secondary | ICD-10-CM | POA: Insufficient documentation

## 2015-04-30 DIAGNOSIS — O3660X1 Maternal care for excessive fetal growth, unspecified trimester, fetus 1: Secondary | ICD-10-CM

## 2015-04-30 DIAGNOSIS — D508 Other iron deficiency anemias: Secondary | ICD-10-CM | POA: Insufficient documentation

## 2015-04-30 DIAGNOSIS — Z3483 Encounter for supervision of other normal pregnancy, third trimester: Secondary | ICD-10-CM

## 2015-04-30 DIAGNOSIS — Z3493 Encounter for supervision of normal pregnancy, unspecified, third trimester: Secondary | ICD-10-CM

## 2015-04-30 DIAGNOSIS — D509 Iron deficiency anemia, unspecified: Secondary | ICD-10-CM

## 2015-04-30 HISTORY — DX: Iron deficiency anemia, unspecified: D50.9

## 2015-05-01 ENCOUNTER — Encounter: Payer: Self-pay | Admitting: Obstetrics

## 2015-05-01 NOTE — Progress Notes (Signed)
Subjective:    Nicole Mclean is a 19 y.o. female being seen today for her obstetrical visit. She is at 1211w2d gestation. Patient reports no complaints. Fetal movement: normal.  Problem List Items Addressed This Visit    None     Patient Active Problem List   Diagnosis Date Noted  . Other iron deficiency anemias 04/30/2015  . Encounter for fetal anatomic survey   . [redacted] weeks gestation of pregnancy   . Supervision of other normal pregnancy 06/07/2014  . Ruptured, membranes, premature 06/06/2014   Objective:    BP 111/75 mmHg  Pulse 85  Temp(Src) 98.4 F (36.9 C)  Wt 209 lb (94.802 kg)  LMP 10/01/2014 FHT:  150 BPM  Uterine Size: Size > Dates  Presentation: unsure     Assessment:    Pregnancy @ 6111w2d weeks   Plan:     labs reviewed, problem list updated Consent signed. GBS sent TDAP offered  Rhogam given for RH negative Pediatrician: discussed. Infant feeding: plans to breastfeed. Maternity leave: discussed. Cigarette smoking: former smoker. No orders of the defined types were placed in this encounter.   No orders of the defined types were placed in this encounter.   Follow up in 2 Weeks.

## 2015-05-14 ENCOUNTER — Ambulatory Visit (INDEPENDENT_AMBULATORY_CARE_PROVIDER_SITE_OTHER): Payer: Self-pay | Admitting: Obstetrics

## 2015-05-14 VITALS — BP 107/67 | HR 114 | Temp 98.5°F | Wt 214.0 lb

## 2015-05-14 DIAGNOSIS — Z3483 Encounter for supervision of other normal pregnancy, third trimester: Secondary | ICD-10-CM

## 2015-05-14 LAB — POCT URINALYSIS DIPSTICK
Bilirubin, UA: NEGATIVE
Blood, UA: NEGATIVE
Glucose, UA: NEGATIVE
Ketones, UA: NEGATIVE
Nitrite, UA: NEGATIVE
Spec Grav, UA: 1.02
UROBILINOGEN UA: NEGATIVE
pH, UA: 6.5

## 2015-05-15 ENCOUNTER — Encounter: Payer: Self-pay | Admitting: Obstetrics

## 2015-05-15 NOTE — Progress Notes (Signed)
Subjective:    Nicole Mclean is a 19 y.o. female being seen today for her obstetrical visit. She is at 2452w2d gestation. Patient reports no complaints. Fetal movement: normal.  Problem List Items Addressed This Visit    None    Visit Diagnoses    Supervision of other normal pregnancy, antepartum, third trimester    -  Primary    Relevant Orders    POCT urinalysis dipstick (Completed)      Patient Active Problem List   Diagnosis Date Noted  . Other iron deficiency anemias 04/30/2015  . Encounter for fetal anatomic survey   . [redacted] weeks gestation of pregnancy   . Supervision of other normal pregnancy 06/07/2014  . Ruptured, membranes, premature 06/06/2014   Objective:    BP 107/67 mmHg  Pulse 114  Temp(Src) 98.5 F (36.9 C)  Wt 214 lb (97.07 kg)  LMP 10/01/2014 FHT:  140 BPM  Uterine Size: size equals dates  Presentation: unsure     Assessment:    Pregnancy @ 6952w2d weeks   Plan:     labs reviewed, problem list updated Consent signed. GBS sent TDAP offered  Rhogam given for RH negative Pediatrician: discussed. Infant feeding: plans to breastfeed. Maternity leave: discussed. Cigarette smoking: former smoker. Orders Placed This Encounter  Procedures  . POCT urinalysis dipstick   No orders of the defined types were placed in this encounter.   Follow up in 2 Weeks.

## 2015-05-20 ENCOUNTER — Encounter (HOSPITAL_COMMUNITY): Payer: Self-pay

## 2015-05-20 ENCOUNTER — Inpatient Hospital Stay (HOSPITAL_COMMUNITY): Payer: Medicaid Other

## 2015-05-20 ENCOUNTER — Inpatient Hospital Stay (HOSPITAL_COMMUNITY)
Admission: AD | Admit: 2015-05-20 | Discharge: 2015-05-20 | Disposition: A | Discharge status: Home / Self Care | Payer: Medicaid Other | Source: Ambulatory Visit | Attending: Obstetrics | Admitting: Obstetrics

## 2015-05-20 DIAGNOSIS — Z3A33 33 weeks gestation of pregnancy: Secondary | ICD-10-CM | POA: Insufficient documentation

## 2015-05-20 DIAGNOSIS — Z87891 Personal history of nicotine dependence: Secondary | ICD-10-CM | POA: Diagnosis not present

## 2015-05-20 DIAGNOSIS — R102 Pelvic and perineal pain: Secondary | ICD-10-CM | POA: Diagnosis present

## 2015-05-20 DIAGNOSIS — O4703 False labor before 37 completed weeks of gestation, third trimester: Secondary | ICD-10-CM

## 2015-05-20 DIAGNOSIS — O4700 False labor before 37 completed weeks of gestation, unspecified trimester: Secondary | ICD-10-CM

## 2015-05-20 LAB — URINALYSIS, ROUTINE W REFLEX MICROSCOPIC
BILIRUBIN URINE: NEGATIVE
GLUCOSE, UA: NEGATIVE mg/dL
Hgb urine dipstick: NEGATIVE
KETONES UR: NEGATIVE mg/dL
Leukocytes, UA: NEGATIVE
Nitrite: NEGATIVE
PH: 6.5 (ref 5.0–8.0)
Protein, ur: NEGATIVE mg/dL
SPECIFIC GRAVITY, URINE: 1.02 (ref 1.005–1.030)
Urobilinogen, UA: 0.2 mg/dL (ref 0.0–1.0)

## 2015-05-20 LAB — FETAL FIBRONECTIN: Fetal Fibronectin: NEGATIVE

## 2015-05-20 LAB — WET PREP, GENITAL
CLUE CELLS WET PREP: NONE SEEN
TRICH WET PREP: NONE SEEN
YEAST WET PREP: NONE SEEN

## 2015-05-20 MED ORDER — NIFEDIPINE 10 MG PO CAPS
10.0000 mg | ORAL_CAPSULE | Freq: Once | ORAL | Status: AC
  Administered 2015-05-20: 10 mg via ORAL
  Filled 2015-05-20: qty 1

## 2015-05-20 MED ORDER — NIFEDIPINE ER OSMOTIC RELEASE 30 MG PO TB24: 30.0000 mg | ORAL_TABLET | Freq: Two times a day (BID) | ORAL | Status: DC | PRN

## 2015-05-20 NOTE — Discharge Instructions (Signed)
Braxton Hicks Contractions °Contractions of the uterus can occur throughout pregnancy. Contractions are not always a sign that you are in labor.  °WHAT ARE BRAXTON HICKS CONTRACTIONS?  °Contractions that occur before labor are called Braxton Hicks contractions, or false labor. Toward the end of pregnancy (32-34 weeks), these contractions can develop more often and may become more forceful. This is not true labor because these contractions do not result in opening (dilatation) and thinning of the cervix. They are sometimes difficult to tell apart from true labor because these contractions can be forceful and people have different pain tolerances. You should not feel embarrassed if you go to the hospital with false labor. Sometimes, the only way to tell if you are in true labor is for your health care provider to look for changes in the cervix. °If there are no prenatal problems or other health problems associated with the pregnancy, it is completely safe to be sent home with false labor and await the onset of true labor. °HOW CAN YOU TELL THE DIFFERENCE BETWEEN TRUE AND FALSE LABOR? °False Labor °· The contractions of false labor are usually shorter and not as hard as those of true labor.   °· The contractions are usually irregular.   °· The contractions are often felt in the front of the lower abdomen and in the groin.   °· The contractions may go away when you walk around or change positions while lying down.   °· The contractions get weaker and are shorter lasting as time goes on.   °· The contractions do not usually become progressively stronger, regular, and closer together as with true labor.   °True Labor °· Contractions in true labor last 30-70 seconds, become very regular, usually become more intense, and increase in frequency.   °· The contractions do not go away with walking.   °· The discomfort is usually felt in the top of the uterus and spreads to the lower abdomen and low back.   °· True labor can be  determined by your health care provider with an exam. This will show that the cervix is dilating and getting thinner.   °WHAT TO REMEMBER °· Keep up with your usual exercises and follow other instructions given by your health care provider.   °· Take medicines as directed by your health care provider.   °· Keep your regular prenatal appointments.   °· Eat and drink lightly if you think you are going into labor.   °· If Braxton Hicks contractions are making you uncomfortable:   °¨ Change your position from lying down or resting to walking, or from walking to resting.   °¨ Sit and rest in a tub of warm water.   °¨ Drink 2-3 glasses of water. Dehydration may cause these contractions.   °¨ Do slow and deep breathing several times an hour.   °WHEN SHOULD I SEEK IMMEDIATE MEDICAL CARE? °Seek immediate medical care if: °· Your contractions become stronger, more regular, and closer together.   °· You have fluid leaking or gushing from your vagina.   °· You have a fever.   °· You pass blood-tinged mucus.   °· You have vaginal bleeding.   °· You have continuous abdominal pain.   °· You have low back pain that you never had before.   °· You feel your baby's head pushing down and causing pelvic pressure.   °· Your baby is not moving as much as it used to.   °  °This information is not intended to replace advice given to you by your health care provider. Make sure you discuss any questions you have with your health care   provider. °  °Document Released: 07/04/2005 Document Revised: 07/09/2013 Document Reviewed: 04/15/2013 °Elsevier Interactive Patient Education ©2016 Elsevier Inc. ° °Preterm Labor Information °Preterm labor is when labor starts at less than 37 weeks of pregnancy. The normal length of a pregnancy is 39 to 41 weeks. °CAUSES °Often, there is no identifiable underlying cause as to why a woman goes into preterm labor. One of the most common known causes of preterm labor is infection. Infections of the uterus, cervix,  vagina, amniotic sac, bladder, kidney, or even the lungs (pneumonia) can cause labor to start. Other suspected causes of preterm labor include:  °· Urogenital infections, such as yeast infections and bacterial vaginosis.   °· Uterine abnormalities (uterine shape, uterine septum, fibroids, or bleeding from the placenta).   °· A cervix that has been operated on (it may fail to stay closed).   °· Malformations in the fetus.   °· Multiple gestations (twins, triplets, and so on).   °· Breakage of the amniotic sac.   °RISK FACTORS °· Having a previous history of preterm labor.   °· Having premature rupture of membranes (PROM).   °· Having a placenta that covers the opening of the cervix (placenta previa).   °· Having a placenta that separates from the uterus (placental abruption).   °· Having a cervix that is too weak to hold the fetus in the uterus (incompetent cervix).   °· Having too much fluid in the amniotic sac (polyhydramnios).   °· Taking illegal drugs or smoking while pregnant.   °· Not gaining enough weight while pregnant.   °· Being younger than 18 and older than 20 years old.   °· Having a low socioeconomic status.   °· Being African American. °SYMPTOMS °Signs and symptoms of preterm labor include:  °· Menstrual-like cramps, abdominal pain, or back pain. °· Uterine contractions that are regular, as frequent as six in an hour, regardless of their intensity (may be mild or painful). °· Contractions that start on the top of the uterus and spread down to the lower abdomen and back.   °· A sense of increased pelvic pressure.   °· A watery or bloody mucus discharge that comes from the vagina.   °TREATMENT °Depending on the length of the pregnancy and other circumstances, your health care provider may suggest bed rest. If necessary, there are medicines that can be given to stop contractions and to mature the fetal lungs. If labor happens before 34 weeks of pregnancy, a prolonged hospital stay may be recommended.  Treatment depends on the condition of both you and the fetus.  °WHAT SHOULD YOU DO IF YOU THINK YOU ARE IN PRETERM LABOR? °Call your health care provider right away. You will need to go to the hospital to get checked immediately. °HOW CAN YOU PREVENT PRETERM LABOR IN FUTURE PREGNANCIES? °You should:  °· Stop smoking if you smoke.  °· Maintain healthy weight gain and avoid chemicals and drugs that are not necessary. °· Be watchful for any type of infection. °· Inform your health care provider if you have a known history of preterm labor. °  °This information is not intended to replace advice given to you by your health care provider. Make sure you discuss any questions you have with your health care provider. °  °Document Released: 09/24/2003 Document Revised: 03/06/2013 Document Reviewed: 08/06/2012 °Elsevier Interactive Patient Education ©2016 Elsevier Inc. ° °

## 2015-05-20 NOTE — MAU Provider Note (Signed)
MAU HISTORY AND PHYSICAL  Chief Complaint:  pelvic pressure    Nicole Mclean is a 19 y.o.  G3P1011 with IUP at [redacted]w[redacted]d presenting for pelvic pressure   Patient reports beginning yesterday intermittent, sometimes every 20-30 minutes, sometimes more frequent, contraction pressure pain across uterus. No leakage of fluid, no bleeding, no dysuria or hematuria. No recent sex or other objects in vagina.    Past Medical History  Diagnosis Date  . Gonorrhea April 2015    Past Surgical History  Procedure Laterality Date  . Rhytidectomy neck / cheek / chin    . Hand surgery      Left  hand  . Hand surgery      left   . Chin surgery      History reviewed. No pertinent family history.  Social History  Substance Use Topics  . Smoking status: Former Smoker    Types: Cigars    Quit date: 08/04/2013  . Smokeless tobacco: Never Used  . Alcohol Use: No    No Known Allergies  Prescriptions prior to admission  Medication Sig Dispense Refill Last Dose  . Prenatal Vit-Fe Fumarate-FA (MULTIVITAMIN-PRENATAL) 27-0.8 MG TABS tablet Take 1 tablet by mouth daily at 12 noon.   05/19/2015 at Unknown time    Review of Systems - Negative except for what is mentioned in HPI.  Physical Exam  Blood pressure 130/69, pulse 96, temperature 98.3 F (36.8 C), resp. rate 18, height 5' (1.524 m), weight 213 lb (96.616 kg), last menstrual period 10/01/2014, not currently breastfeeding. GENERAL: Well-developed, well-nourished female in no acute distress.  LUNGS: Clear to auscultation bilaterally.  HEART: Regular rate and rhythm. ABDOMEN: Soft, nontender, nondistended, gravid.  EXTREMITIES: Nontender, no edema, 2+ distal pulses. Cervical Exam: closed/thick/ballotable Presentation: vertex FHT:  140/mod/+a/3 variables Contractions: Every 3-6 mins  AFI 9.17 cm   Labs: Results for orders placed or performed during the hospital encounter of 05/20/15 (from the past 24 hour(s))  Urinalysis, Routine w reflex  microscopic (not at [facility])   Collection Time: 05/20/15 12:00 PM  Result Value Ref Range   Color, Urine YELLOW YELLOW   APPearance CLEAR CLEAR   Specific Gravity, Urine 1.020 1.005 - 1.030   pH 6.5 5.0 - 8.0   Glucose, UA NEGATIVE NEGATIVE mg/dL   Hgb urine dipstick NEGATIVE NEGATIVE   Bilirubin Urine NEGATIVE NEGATIVE   Ketones, ur NEGATIVE NEGATIVE mg/dL   Protein, ur NEGATIVE NEGATIVE mg/dL   Urobilinogen, UA 0.2 0.0 - 1.0 mg/dL   Nitrite NEGATIVE NEGATIVE   Leukocytes, UA NEGATIVE NEGATIVE  Fetal fibronectin   Collection Time: 05/20/15  1:52 PM  Result Value Ref Range   Fetal Fibronectin NEGATIVE NEGATIVE  Wet prep, genital   Collection Time: 05/20/15  3:50 PM  Result Value Ref Range   Yeast Wet Prep HPF POC NONE SEEN NONE SEEN   Trich, Wet Prep NONE SEEN NONE SEEN   Clue Cells Wet Prep HPF POC NONE SEEN NONE SEEN   WBC, Wet Prep HPF POC FEW (A) NONE SEEN   MDM - will check TVUS for cervical length; if > 3 cm PTL unlikely.  Imaging Studies:  [place] [facility] Ob Follow Up  04/24/2015  OBSTETRICAL ULTRASOUND: This exam was performed within a  Ultrasound Department. The OB US report was generated in the AS system, and faxed to the ordering physician.  This report is available in the [employer]. See the AS Obstetric US report via the Image Link.   Assessment: Nicole E  Para Mclean is  19 y.o. G3P1011 at 212w0d presents with contractions. Not particularly painful. No signs/symptoms rom or abruption. NST reactive, but with 3 variables. I then checked bedside ultrasound, with normal afi of 9.17 cm.. Cervix closed, TVUS showing cervical length of 30 mm, so ffn was run and returned negative. Thus do not think this pre-term labor and have not given betamethasone. UA not suggestive of infection, wet prep not suggestive of vaginitis. We gave procardia twice, with some improvement in symptoms. Called and discussed all this, including fetal heart tracing, with patient's OB Dr. Clearance CootsHarper, he is OK with  d/c home with appropriate return precautions and ob f/u next week as scheduled, with prn procardia.  Plan: - ptl, pprom, and abruption return precautions - procardia 30 xl bid prn  Silvano Bilisoah B Asuka Dusseau 11/2/20164:31 PM

## 2015-05-20 NOTE — MAU Note (Signed)
Pt presents to MAU with complaints of pelvic pressure since yesterday around 2oclock. Denies any vaginal bleeding or LOF

## 2015-05-27 ENCOUNTER — Encounter (HOSPITAL_COMMUNITY): Payer: Self-pay

## 2015-05-27 ENCOUNTER — Inpatient Hospital Stay (HOSPITAL_COMMUNITY)
Admission: AD | Admit: 2015-05-27 | Discharge: 2015-05-27 | Disposition: A | Discharge status: Home / Self Care | Payer: Medicaid Other | Source: Ambulatory Visit | Attending: Obstetrics | Admitting: Obstetrics

## 2015-05-27 DIAGNOSIS — Z3A34 34 weeks gestation of pregnancy: Secondary | ICD-10-CM | POA: Diagnosis not present

## 2015-05-27 DIAGNOSIS — O4703 False labor before 37 completed weeks of gestation, third trimester: Secondary | ICD-10-CM

## 2015-05-27 DIAGNOSIS — N858 Other specified noninflammatory disorders of uterus: Secondary | ICD-10-CM

## 2015-05-27 DIAGNOSIS — Z87891 Personal history of nicotine dependence: Secondary | ICD-10-CM | POA: Insufficient documentation

## 2015-05-27 DIAGNOSIS — N859 Noninflammatory disorder of uterus, unspecified: Secondary | ICD-10-CM

## 2015-05-27 LAB — URINE MICROSCOPIC-ADD ON

## 2015-05-27 LAB — URINALYSIS, ROUTINE W REFLEX MICROSCOPIC
Bilirubin Urine: NEGATIVE
Glucose, UA: NEGATIVE mg/dL
KETONES UR: NEGATIVE mg/dL
LEUKOCYTES UA: NEGATIVE
NITRITE: NEGATIVE
PH: 6 (ref 5.0–8.0)
PROTEIN: NEGATIVE mg/dL
Specific Gravity, Urine: 1.025 (ref 1.005–1.030)
UROBILINOGEN UA: 0.2 mg/dL (ref 0.0–1.0)

## 2015-05-27 NOTE — MAU Note (Signed)
Pt presents to MAU with complaints of pelvic pain with pressure. Denies any vaginal bleeding or LOF

## 2015-05-27 NOTE — MAU Provider Note (Signed)
Chief Complaint:  pelvic pressure    First Provider Initiated Contact with Patient 05/27/15 1200      HPI: Nicole Mclean is a 19 y.o. G3P1011 at 78w0dwho presents to maternity admissions reporting uterine contractions.  States has been on Procardia for previous PTL, and did take her doses today (takes it BID).  States baby is moving well.  Denies leaking or bleeding.  She reports good fetal movement, denies LOF, vaginal bleeding, vaginal itching/burning, urinary symptoms, h/a, dizziness, n/v, or fever/chills.    HPI  Past Medical History: Past Medical History  Diagnosis Date  . Gonorrhea April 2015    Past obstetric history: OB History  Gravida Para Term Preterm AB SAB TAB Ectopic Multiple Living  0 1 1 0 0 0 1    # Outcome Date GA Lbr Len/2nd Weight Sex Delivery Anes PTL Lv  3 Current           2 Term 06/07/14 110w1d 15:32 / 01:19 7 lb 5.1 oz (3.32 kg) M Vag-Spont EPI  Y  1 SAB 2014              Past Surgical History: Past Surgical History  Procedure Laterality Date  . Rhytidectomy neck / cheek / chin    . Hand surgery      Left  hand  . Hand surgery      left   . Chin surgery      Family History: History reviewed. No pertinent family history.  Social History: Social History  Substance Use Topics  . Smoking status: Former Smoker    Types: Cigars    Quit date: 08/04/2013  . Smokeless tobacco: Never Used  . Alcohol Use: No    Allergies: No Known Allergies  Meds:  Prescriptions prior to admission  Medication Sig Dispense Refill Last Dose  . NIFEdipine (PROCARDIA XL) 30 MG 24 hr tablet Take 1 tablet (30 mg total) by mouth every 12 (twelve) hours as needed (painful contractions). 60 tablet 1 05/27/2015 at Unknown time  . Prenatal Vit-Fe Fumarate-FA (MULTIVITAMIN-PRENATAL) 27-0.8 MG TABS tablet Take 1 tablet by mouth daily at 12 noon.   05/27/2015 at Unknown time    ROS:  Review of Systems  Constitutional: Negative for fever and chills.  Gastrointestinal:  Positive for abdominal pain (mild cramps). Negative for nausea, vomiting, diarrhea and constipation.  Genitourinary: Negative for vaginal bleeding, vaginal discharge and difficulty urinating.  Musculoskeletal: Negative for back pain.   I have reviewed patient's Past Medical Hx, Surgical Hx, Family Hx, Social Hx, medications and allergies.   Physical Exam  Patient Vitals for the past 24 hrs:  BP Temp Temp src Pulse Resp  05/27/15 1130 137/71 mmHg 97.6 F (36.4 C) Oral 92 18   Constitutional: Well-developed, well-nourished female in no acute distress.  Cardiovascular: normal rate Respiratory: normal effort GI: Abd soft, non-tender, gravid appropriate for gestational age.  MS: Extremities nontender, no edema, normal ROM Neurologic: Alert and oriented x 4.  GU: Neg CVAT.  Bimanual exam: Cervix FT/long/high, firm, anterior, neg CMT, uterus nontender, nonenlarged, adnexa without tenderness, enlargement, or mass  Dilation: Fingertip Effacement (%): 30 Exam by:: Wynelle Bourgeois CNM   FHT:  Baseline 135 , moderate variability, accelerations present, no decelerations Contractions: Occasional   Labs: No results found for this or any previous visit (from the past 24 hour(s)). O/POS/-- (06/28 1354)  Imaging:    MAU Course/MDM: I have ordered labs and reviewed results.   Fetal fibronectin last week  was NEGATIVE  Treatments in MAU included observation and PO hydration Her contractions decreased over time and I did not find it necessary to give her any Procardia.   Pt stable at time of discharge.  Assessment: No diagnosis found.  Plan: Discharge home Discussed PO hydration to get urine clear Continue Procardia as ordered at home Labor precautions and fetal kick counts Has an appointment tomorrow with Dr Clearance CootsHarper    Medication List    ASK your doctor about these medications        multivitamin-prenatal 27-0.8 MG Tabs tablet  Take 1 tablet by mouth daily at 12 noon.      NIFEdipine 30 MG 24 hr tablet  Commonly known as:  PROCARDIA XL  Take 1 tablet (30 mg total) by mouth every 12 (twelve) hours as needed (painful contractions).        Wynelle BourgeoisMarie Tawyna Pellot CNM, MSN Certified Nurse-Midwife 05/27/2015 12:21 PM

## 2015-05-27 NOTE — Discharge Instructions (Signed)
Pelvic Rest Pelvic rest is sometimes recommended for women when:   The placenta is partially or completely covering the opening of the cervix (placenta previa).  There is bleeding between the uterine wall and the amniotic sac in the first trimester (subchorionic hemorrhage).  The cervix begins to open without labor starting (incompetent cervix, cervical insufficiency).  The labor is too early (preterm labor). HOME CARE INSTRUCTIONS  Do not have sexual intercourse, stimulation, or an orgasm.  Do not use tampons, douche, or put anything in the vagina.  Do not lift anything over 10 pounds (4.5 kg).  Avoid strenuous activity or straining your pelvic muscles. SEEK MEDICAL CARE IF:  You have any vaginal bleeding during pregnancy. Treat this as a potential emergency.  You have cramping pain felt low in the stomach (stronger than menstrual cramps).  You notice vaginal discharge (watery, mucus, or bloody).  You have a low, dull backache.  There are regular contractions or uterine tightening. SEEK IMMEDIATE MEDICAL CARE IF: You have vaginal bleeding and have placenta previa.    This information is not intended to replace advice given to you by your health care provider. Make sure you discuss any questions you have with your health care provider.   Document Released: 10/29/2010 Document Revised: 09/26/2011 Document Reviewed: 01/05/2015 Elsevier Interactive Patient Education 2016 [employer].  Preterm Labor Information Preterm labor is when labor starts before you are [redacted] weeks pregnant. The normal length of pregnancy is 39 to 41 weeks.  CAUSES  The cause of preterm labor is not often known. The most common known cause is infection. RISK FACTORS  Having a history of preterm labor.  Having your water break before it should.  Having a placenta that covers the opening of the cervix.  Having a placenta that breaks away from the uterus.  Having a cervix that is too weak to hold  the baby in the uterus.  Having too much fluid in the amniotic sac.  Taking drugs or smoking while pregnant.  Not gaining enough weight while pregnant.  Being younger than 29 and older than 20 years old.  Having a low income.  Being African American. SYMPTOMS  Period-like cramps, belly (abdominal) pain, or back pain.  Contractions that are regular, as often as six in an hour. They may be mild or painful.  Contractions that start at the top of the belly. They then move to the lower belly and back.  Lower belly pressure that seems to get stronger.  Bleeding from the vagina.  Fluid leaking from the vagina. TREATMENT  Treatment depends on:  Your condition.  The condition of your baby.  How many weeks pregnant you are. Your doctor may have you:  Take medicine to stop contractions.  Stay in bed except to use the restroom (bed rest).  Stay in the hospital. WHAT SHOULD YOU DO IF YOU THINK YOU ARE IN PRETERM LABOR? Call your doctor right away. You need to go to the hospital right away.  HOW CAN YOU PREVENT PRETERM LABOR IN FUTURE PREGNANCIES?  Stop smoking, if you smoke.  Maintain healthy weight gain.  Do not take drugs or be around chemicals that are not needed.  Tell your doctor if you think you have an infection.  Tell your doctor if you had a preterm labor before.   This information is not intended to replace advice given to you by your health care provider. Make sure you discuss any questions you have with your health care provider.  Document Released: 09/30/2008 Document Revised: 11/18/2014 Document Reviewed: 08/06/2012 Elsevier Interactive Patient Education [employer]2016 Elsevier Inc.

## 2015-05-28 ENCOUNTER — Encounter: Payer: Self-pay | Admitting: Obstetrics

## 2015-05-28 ENCOUNTER — Encounter: Payer: Medicaid Other | Admitting: Obstetrics

## 2015-05-28 ENCOUNTER — Encounter: Payer: Self-pay | Admitting: *Deleted

## 2015-05-28 ENCOUNTER — Ambulatory Visit (INDEPENDENT_AMBULATORY_CARE_PROVIDER_SITE_OTHER): Payer: Medicaid Other | Admitting: Obstetrics

## 2015-05-28 VITALS — BP 114/74 | HR 105 | Wt 216.0 lb

## 2015-05-28 DIAGNOSIS — Z3483 Encounter for supervision of other normal pregnancy, third trimester: Secondary | ICD-10-CM

## 2015-05-28 LAB — POCT URINALYSIS DIPSTICK
BILIRUBIN UA: NEGATIVE
Blood, UA: NEGATIVE
Glucose, UA: NEGATIVE
KETONES UA: NEGATIVE
Nitrite, UA: NEGATIVE
PH UA: 6.5
SPEC GRAV UA: 1.015
Urobilinogen, UA: NEGATIVE

## 2015-05-28 NOTE — Progress Notes (Signed)
Subjective:    Nicole Mclean is a 19 y.o. female being seen today for her obstetrical visit. She is at 3880w1d gestation. Patient reports Occasional UC's, mild.  Fetal movement: normal.  Problem List Items Addressed This Visit    None    Visit Diagnoses    Encounter for supervision of other normal pregnancy in third trimester    -  Primary    Relevant Orders    POCT urinalysis dipstick      Patient Active Problem List   Diagnosis Date Noted  . Other iron deficiency anemias 04/30/2015  . Encounter for fetal anatomic survey   . [redacted] weeks gestation of pregnancy   . Supervision of other normal pregnancy 06/07/2014  . Ruptured, membranes, premature 06/06/2014   Objective:    BP 114/74 mmHg  Pulse 105  Wt 216 lb (97.977 kg)  LMP 10/01/2014 FHT:  140 BPM  Uterine Size: size equals dates  Presentation: unsure     Assessment:    Pregnancy @ 4780w1d weeks   Plan:     labs reviewed, problem list updated Consent signed. GBS sent TDAP offered  Rhogam given for RH negative Pediatrician: discussed. Infant feeding: plans to breastfeed. Maternity leave: discussed. Cigarette smoking: former smoker Orders Placed This Encounter  Procedures  . POCT urinalysis dipstick   No orders of the defined types were placed in this encounter.   Follow up in 1 Week.

## 2015-06-04 ENCOUNTER — Encounter: Payer: Self-pay | Admitting: Obstetrics

## 2015-06-04 ENCOUNTER — Ambulatory Visit (INDEPENDENT_AMBULATORY_CARE_PROVIDER_SITE_OTHER): Payer: Medicaid Other | Admitting: Obstetrics

## 2015-06-04 VITALS — BP 129/76 | HR 120 | Wt 216.0 lb

## 2015-06-04 DIAGNOSIS — Z3483 Encounter for supervision of other normal pregnancy, third trimester: Secondary | ICD-10-CM

## 2015-06-04 LAB — POCT URINALYSIS DIPSTICK
Bilirubin, UA: NEGATIVE
Blood, UA: NEGATIVE
GLUCOSE UA: NEGATIVE
KETONES UA: NEGATIVE
Leukocytes, UA: NEGATIVE
Nitrite, UA: NEGATIVE
Protein, UA: NEGATIVE
SPEC GRAV UA: 1.01
Urobilinogen, UA: NEGATIVE
pH, UA: 7

## 2015-06-04 NOTE — Progress Notes (Signed)
Subjective:    Nicole Mclean is a 19 y.o. female being seen today for her obstetrical visit. She is at 2469w1d gestation. Patient reports no complaints. Fetal movement: normal.  Problem List Items Addressed This Visit    None    Visit Diagnoses    Encounter for supervision of other normal pregnancy in third trimester    -  Primary    Relevant Orders    POCT urinalysis dipstick (Completed)    Strep B DNA probe      Patient Active Problem List   Diagnosis Date Noted  . Other iron deficiency anemias 04/30/2015  . Encounter for fetal anatomic survey   . [redacted] weeks gestation of pregnancy   . Supervision of other normal pregnancy 06/07/2014  . Ruptured, membranes, premature 06/06/2014   Objective:    BP 129/76 mmHg  Pulse 120  Wt 216 lb (97.977 kg)  LMP 10/01/2014 FHT:  150 BPM  Uterine Size: size equals dates  Presentation: unsure     Assessment:    Pregnancy @ 2769w1d weeks   Plan:     labs reviewed, problem list updated Consent signed. GBS sent TDAP offered  Rhogam given for RH negative Pediatrician: discussed. Infant feeding: plans to breastfeed. Maternity leave: discussed. Cigarette smoking: former smoker. Orders Placed This Encounter  Procedures  . Strep B DNA probe  . POCT urinalysis dipstick   No orders of the defined types were placed in this encounter.   Follow up in 1 Week.

## 2015-06-04 NOTE — Progress Notes (Signed)
Pt is having increase pelvic pressure.

## 2015-06-05 LAB — STREP B DNA PROBE: STREP GROUP B AG: NOT DETECTED

## 2015-06-10 ENCOUNTER — Ambulatory Visit (INDEPENDENT_AMBULATORY_CARE_PROVIDER_SITE_OTHER): Payer: Medicaid Other | Admitting: Obstetrics

## 2015-06-10 VITALS — BP 130/80 | HR 92 | Temp 98.1°F

## 2015-06-10 DIAGNOSIS — Z3483 Encounter for supervision of other normal pregnancy, third trimester: Secondary | ICD-10-CM

## 2015-06-10 LAB — POCT URINALYSIS DIPSTICK
BILIRUBIN UA: NEGATIVE
Glucose, UA: NEGATIVE
KETONES UA: NEGATIVE
LEUKOCYTES UA: NEGATIVE
NITRITE UA: NEGATIVE
PH UA: 8
PROTEIN UA: NEGATIVE
RBC UA: NEGATIVE
Spec Grav, UA: 1.01
Urobilinogen, UA: NEGATIVE

## 2015-06-12 ENCOUNTER — Encounter: Payer: Self-pay | Admitting: Obstetrics

## 2015-06-12 NOTE — Progress Notes (Signed)
Subjective:    Nicole Mclean is a 19 y.o. female being seen today for her obstetrical visit. She is at 257w2d gestation. Patient reports no complaints. Fetal movement: normal.  Problem List Items Addressed This Visit    None    Visit Diagnoses    Supervision of other normal pregnancy, antepartum, third trimester    -  Primary    Relevant Orders    POCT urinalysis dipstick (Completed)      Patient Active Problem List   Diagnosis Date Noted  . Other iron deficiency anemias 04/30/2015  . Encounter for fetal anatomic survey   . [redacted] weeks gestation of pregnancy   . Supervision of other normal pregnancy 06/07/2014  . Ruptured, membranes, premature 06/06/2014   Objective:    BP 130/80 mmHg  Pulse 92  Temp(Src) 98.1 F (36.7 C)  LMP 10/01/2014 FHT:  140 BPM  Uterine Size: size equals dates  Presentation: unsure     Assessment:    Pregnancy @ 3857w2d weeks   Plan:     labs reviewed, problem list updated Consent signed. GBS sent TDAP offered  Rhogam given for RH negative Pediatrician: discussed. Infant feeding: plans to breastfeed. Maternity leave: discussed. Cigarette smoking: former smoker. Orders Placed This Encounter  Procedures  . POCT urinalysis dipstick   No orders of the defined types were placed in this encounter.   Follow up in 1 Week.

## 2015-06-16 ENCOUNTER — Other Ambulatory Visit: Payer: Self-pay | Admitting: Certified Nurse Midwife

## 2015-06-18 ENCOUNTER — Ambulatory Visit (INDEPENDENT_AMBULATORY_CARE_PROVIDER_SITE_OTHER): Payer: Medicaid Other | Admitting: Certified Nurse Midwife

## 2015-06-18 ENCOUNTER — Encounter: Payer: Self-pay | Admitting: *Deleted

## 2015-06-18 ENCOUNTER — Other Ambulatory Visit: Payer: Self-pay | Admitting: *Deleted

## 2015-06-18 VITALS — BP 123/79 | HR 96 | Wt 218.0 lb

## 2015-06-18 DIAGNOSIS — Z3483 Encounter for supervision of other normal pregnancy, third trimester: Secondary | ICD-10-CM

## 2015-06-18 LAB — POCT URINALYSIS DIPSTICK
Blood, UA: NEGATIVE
GLUCOSE UA: NEGATIVE
Ketones, UA: NEGATIVE
NITRITE UA: NEGATIVE
Spec Grav, UA: 1.015
UROBILINOGEN UA: NEGATIVE
pH, UA: 6.5

## 2015-06-18 NOTE — Progress Notes (Signed)
Pt is doing well, some tiredness / contractions.

## 2015-06-18 NOTE — Progress Notes (Signed)
Subjective:    Nicole Mclean is a 19 y.o. female being seen today for her obstetrical visit. She is at 6346w1d gestation. Patient reports backache, fatigue, no bleeding, no cramping, no leaking, occasional contractions and swelling especially when working, states she has been working 8-9 hour days and is very tired and swollen at the end. Fetal movement: normal.  Problem List Items Addressed This Visit    None    Visit Diagnoses    Encounter for supervision of other normal pregnancy in third trimester    -  Primary    Relevant Orders    POCT urinalysis dipstick (Completed)      Patient Active Problem List   Diagnosis Date Noted  . Other iron deficiency anemias 04/30/2015  . Encounter for fetal anatomic survey   . [redacted] weeks gestation of pregnancy   . Supervision of other normal pregnancy 06/07/2014  . Ruptured, membranes, premature 06/06/2014    Objective:    BP 123/79 mmHg  Pulse 96  Wt 218 lb (98.884 kg)  LMP 10/01/2014 FHT: 145 BPM  Uterine Size: size equals dates  Presentations: cephalic  Pelvic Exam:              Dilation: 0.5 cm       Effacement: Long             Station:  -3    Consistency: soft            Position: posterior   1+ bilateral pitting edema LE, edema present on upper extremities  Assessment:    Pregnancy @ 4046w1d weeks   IOL scheduled: elective at term  Plan:   Plans for delivery: Vaginal anticipated; labs reviewed; problem list updated Counseling: Consent signed. Infant feeding: plans to breastfeed. Cigarette smoking: never smoked. L&D discussion: symptoms of labor, discussed when to call, discussed what number to call, anesthetic/analgesic options reviewed and delivering clinician:  plans no preference. Postpartum supports and preparation: circumcision discussed and contraception plans discussed.  Follow up in 1 Week.

## 2015-06-18 NOTE — Progress Notes (Signed)
Addendum:  Patient work letter written for reduced work hours to no more than 4 hours per day d/t increased discomforts of pregnancy.  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

## 2015-06-23 ENCOUNTER — Encounter (HOSPITAL_COMMUNITY): Payer: Self-pay | Admitting: *Deleted

## 2015-06-23 ENCOUNTER — Inpatient Hospital Stay (HOSPITAL_COMMUNITY)
Admission: AD | Admit: 2015-06-23 | Discharge: 2015-06-23 | Disposition: A | Discharge status: Home / Self Care | Payer: Medicaid Other | Source: Ambulatory Visit | Attending: Obstetrics | Admitting: Obstetrics

## 2015-06-23 DIAGNOSIS — Z3493 Encounter for supervision of normal pregnancy, unspecified, third trimester: Secondary | ICD-10-CM | POA: Insufficient documentation

## 2015-06-23 NOTE — MAU Note (Signed)
Pt reports uc's 3-5 minutes. Denies vaginal leaking or bleeding. +FM

## 2015-06-23 NOTE — Progress Notes (Addendum)
Dr. Clearance CootsHarper updated on pt status. Pt desires to go home due to minimal cervical change. Pt given labor precautions and educated on when to return to MAU.

## 2015-06-23 NOTE — Discharge Instructions (Signed)
Braxton Hicks Contractions °Contractions of the uterus can occur throughout pregnancy. Contractions are not always a sign that you are in labor.  °WHAT ARE BRAXTON HICKS CONTRACTIONS?  °Contractions that occur before labor are called Braxton Hicks contractions, or false labor. Toward the end of pregnancy (32-34 weeks), these contractions can develop more often and may become more forceful. This is not true labor because these contractions do not result in opening (dilatation) and thinning of the cervix. They are sometimes difficult to tell apart from true labor because these contractions can be forceful and people have different pain tolerances. You should not feel embarrassed if you go to the hospital with false labor. Sometimes, the only way to tell if you are in true labor is for your health care provider to look for changes in the cervix. °If there are no prenatal problems or other health problems associated with the pregnancy, it is completely safe to be sent home with false labor and await the onset of true labor. °HOW CAN YOU TELL THE DIFFERENCE BETWEEN TRUE AND FALSE LABOR? °False Labor °· The contractions of false labor are usually shorter and not as hard as those of true labor.   °· The contractions are usually irregular.   °· The contractions are often felt in the front of the lower abdomen and in the groin.   °· The contractions may go away when you walk around or change positions while lying down.   °· The contractions get weaker and are shorter lasting as time goes on.   °· The contractions do not usually become progressively stronger, regular, and closer together as with true labor.   °True Labor °· Contractions in true labor last 30-70 seconds, become very regular, usually become more intense, and increase in frequency.   °· The contractions do not go away with walking.   °· The discomfort is usually felt in the top of the uterus and spreads to the lower abdomen and low back.   °· True labor can be  determined by your health care provider with an exam. This will show that the cervix is dilating and getting thinner.   °WHAT TO REMEMBER °· Keep up with your usual exercises and follow other instructions given by your health care provider.   °· Take medicines as directed by your health care provider.   °· Keep your regular prenatal appointments.   °· Eat and drink lightly if you think you are going into labor.   °· If Braxton Hicks contractions are making you uncomfortable:   °¨ Change your position from lying down or resting to walking, or from walking to resting.   °¨ Sit and rest in a tub of warm water.   °¨ Drink 2-3 glasses of water. Dehydration may cause these contractions.   °¨ Do slow and deep breathing several times an hour.   °WHEN SHOULD I SEEK IMMEDIATE MEDICAL CARE? °Seek immediate medical care if: °· Your contractions become stronger, more regular, and closer together.   °· You have fluid leaking or gushing from your vagina.   °· You have a fever.   °· You pass blood-tinged mucus.   °· You have vaginal bleeding.   °· You have continuous abdominal pain.   °· You have low back pain that you never had before.   °· You feel your baby's head pushing down and causing pelvic pressure.   °· Your baby is not moving as much as it used to.   °  °This information is not intended to replace advice given to you by your health care provider. Make sure you discuss any questions you have with your health care   provider. °  °Document Released: 07/04/2005 Document Revised: 07/09/2013 Document Reviewed: 04/15/2013 °Elsevier Interactive Patient Education ©2016 Elsevier Inc. ° °

## 2015-06-23 NOTE — MAU Note (Signed)
PT  SAYS  SHE  STARTED  HURTING  BAD  AT    .  VE IN OFFICE   1 CM.    DENIES HSV AND MRSA. GBS-   NEG

## 2015-06-24 ENCOUNTER — Encounter (HOSPITAL_COMMUNITY): Payer: Self-pay | Admitting: *Deleted

## 2015-06-24 ENCOUNTER — Telehealth (HOSPITAL_COMMUNITY): Payer: Self-pay | Admitting: *Deleted

## 2015-06-24 NOTE — Telephone Encounter (Signed)
Preadmission screen  

## 2015-06-25 ENCOUNTER — Ambulatory Visit (INDEPENDENT_AMBULATORY_CARE_PROVIDER_SITE_OTHER): Payer: Medicaid Other | Admitting: Obstetrics

## 2015-06-25 ENCOUNTER — Encounter: Payer: Self-pay | Admitting: Obstetrics

## 2015-06-25 VITALS — BP 138/87 | HR 94 | Temp 97.9°F | Wt 219.0 lb

## 2015-06-25 DIAGNOSIS — Z3483 Encounter for supervision of other normal pregnancy, third trimester: Secondary | ICD-10-CM

## 2015-06-25 LAB — POCT URINALYSIS DIPSTICK
Bilirubin, UA: NEGATIVE
Blood, UA: NEGATIVE
GLUCOSE UA: NEGATIVE
Ketones, UA: NEGATIVE
Leukocytes, UA: NEGATIVE
Nitrite, UA: NEGATIVE
Protein, UA: NEGATIVE
SPEC GRAV UA: 1.015
UROBILINOGEN UA: NEGATIVE
pH, UA: 6

## 2015-06-25 NOTE — Progress Notes (Signed)
Subjective:    Nicole Mclean is a 19 y.o. female being seen today for her obstetrical visit. She is at 6521w1d gestation. Patient reports contractions since Tuesday. Fetal movement: normal.  Problem List Items Addressed This Visit    None    Visit Diagnoses    Supervision of other normal pregnancy, antepartum, third trimester    -  Primary    Relevant Orders    POCT urinalysis dipstick (Completed)      Patient Active Problem List   Diagnosis Date Noted  . Other iron deficiency anemias 04/30/2015  . Encounter for fetal anatomic survey   . [redacted] weeks gestation of pregnancy   . Supervision of other normal pregnancy 06/07/2014  . Ruptured, membranes, premature 06/06/2014    Objective:    BP 138/87 mmHg  Pulse 94  Temp(Src) 97.9 F (36.6 C)  Wt 219 lb (99.338 kg)  LMP 10/01/2014 FHT: 140 BPM  Uterine Size: size equals dates  Presentations: cephalic  Pelvic Exam:              Dilation: 2cm       Effacement: 50%             Station:  -3    Consistency: soft            Position: middle     Assessment:    Pregnancy @ 6921w1d weeks   Plan:   Plans for delivery: Vaginal anticipated; labs reviewed; problem list updated Counseling: Consent signed. Infant feeding: plans to breastfeed. Cigarette smoking: former. L&D discussion: symptoms of labor, discussed when to call, discussed what number to call, anesthetic/analgesic options reviewed and delivering clinician:  plans no preference. Postpartum supports and preparation: circumcision discussed and contraception plans discussed.  Follow up in 1 Week.

## 2015-06-30 ENCOUNTER — Ambulatory Visit (INDEPENDENT_AMBULATORY_CARE_PROVIDER_SITE_OTHER): Payer: Medicaid Other | Admitting: Obstetrics

## 2015-06-30 VITALS — BP 114/72 | HR 102 | Wt 221.0 lb

## 2015-06-30 DIAGNOSIS — Z3483 Encounter for supervision of other normal pregnancy, third trimester: Secondary | ICD-10-CM

## 2015-06-30 LAB — POCT URINALYSIS DIPSTICK
Bilirubin, UA: NEGATIVE
GLUCOSE UA: NEGATIVE
Ketones, UA: NEGATIVE
Leukocytes, UA: NEGATIVE
NITRITE UA: NEGATIVE
PH UA: 7
PROTEIN UA: NEGATIVE
RBC UA: NEGATIVE
SPEC GRAV UA: 1.015
UROBILINOGEN UA: NEGATIVE

## 2015-07-01 ENCOUNTER — Encounter (HOSPITAL_COMMUNITY): Payer: Self-pay

## 2015-07-01 ENCOUNTER — Encounter: Payer: Self-pay | Admitting: Obstetrics

## 2015-07-01 ENCOUNTER — Inpatient Hospital Stay (HOSPITAL_COMMUNITY): Payer: Medicaid Other | Admitting: Anesthesiology

## 2015-07-01 ENCOUNTER — Inpatient Hospital Stay (HOSPITAL_COMMUNITY)
Admission: RE | Admit: 2015-07-01 | Discharge: 2015-07-03 | DRG: 775 | Disposition: A | Discharge status: Home / Self Care | Payer: Medicaid Other | Source: Ambulatory Visit | Attending: Obstetrics | Admitting: Obstetrics

## 2015-07-01 DIAGNOSIS — Z3A39 39 weeks gestation of pregnancy: Secondary | ICD-10-CM | POA: Diagnosis not present

## 2015-07-01 DIAGNOSIS — Z87891 Personal history of nicotine dependence: Secondary | ICD-10-CM

## 2015-07-01 DIAGNOSIS — IMO0002 Reserved for concepts with insufficient information to code with codable children: Secondary | ICD-10-CM | POA: Diagnosis present

## 2015-07-01 DIAGNOSIS — O26893 Other specified pregnancy related conditions, third trimester: Secondary | ICD-10-CM | POA: Diagnosis present

## 2015-07-01 LAB — TYPE AND SCREEN
ABO/RH(D): O POS
Antibody Screen: NEGATIVE

## 2015-07-01 LAB — CBC
HCT: 34.3 % — ABNORMAL LOW (ref 36.0–46.0)
Hemoglobin: 11.3 g/dL — ABNORMAL LOW (ref 12.0–15.0)
MCH: 28.7 pg (ref 26.0–34.0)
MCHC: 32.9 g/dL (ref 30.0–36.0)
MCV: 87.1 fL (ref 78.0–100.0)
PLATELETS: 276 10*3/uL (ref 150–400)
RBC: 3.94 MIL/uL (ref 3.87–5.11)
RDW: 14.4 % (ref 11.5–15.5)
WBC: 11.4 10*3/uL — ABNORMAL HIGH (ref 4.0–10.5)

## 2015-07-01 MED ORDER — CITRIC ACID-SODIUM CITRATE 334-500 MG/5ML PO SOLN: 30.0000 mL | ORAL | Status: DC | PRN

## 2015-07-01 MED ORDER — TERBUTALINE SULFATE 1 MG/ML IJ SOLN
0.2500 mg | Freq: Once | INTRAMUSCULAR | Status: DC | PRN
Start: 2015-07-01 — End: 2015-07-02
  Filled 2015-07-01: qty 1

## 2015-07-01 MED ORDER — LIDOCAINE HCL (PF) 1 % IJ SOLN
30.0000 mL | INTRAMUSCULAR | Status: DC | PRN
  Filled 2015-07-01: qty 30

## 2015-07-01 MED ORDER — OXYCODONE-ACETAMINOPHEN 5-325 MG PO TABS: 2.0000 | ORAL_TABLET | ORAL | Status: DC | PRN

## 2015-07-01 MED ORDER — PROMETHAZINE HCL 25 MG/ML IJ SOLN: 25.0000 mg | Freq: Four times a day (QID) | INTRAMUSCULAR | Status: DC | PRN

## 2015-07-01 MED ORDER — OXYTOCIN 40 UNITS IN LACTATED RINGERS INFUSION - SIMPLE MED
62.5000 mL/h | INTRAVENOUS | Status: DC
  Filled 2015-07-01: qty 1000

## 2015-07-01 MED ORDER — NALBUPHINE HCL 10 MG/ML IJ SOLN: 10.0000 mg | Freq: Four times a day (QID) | INTRAMUSCULAR | Status: DC | PRN

## 2015-07-01 MED ORDER — ACETAMINOPHEN 325 MG PO TABS: 650.0000 mg | ORAL_TABLET | ORAL | Status: DC | PRN

## 2015-07-01 MED ORDER — PHENYLEPHRINE 40 MCG/ML (10ML) SYRINGE FOR IV PUSH (FOR BLOOD PRESSURE SUPPORT)
80.0000 ug | PREFILLED_SYRINGE | INTRAVENOUS | Status: DC | PRN
  Administered 2015-07-02: 80 ug via INTRAVENOUS
  Filled 2015-07-01: qty 20
  Filled 2015-07-01: qty 2

## 2015-07-01 MED ORDER — OXYCODONE-ACETAMINOPHEN 5-325 MG PO TABS: 1.0000 | ORAL_TABLET | ORAL | Status: DC | PRN

## 2015-07-01 MED ORDER — OXYTOCIN BOLUS FROM INFUSION
500.0000 mL | INTRAVENOUS | Status: DC
  Administered 2015-07-02: 500 mL via INTRAVENOUS

## 2015-07-01 MED ORDER — LACTATED RINGERS IV SOLN
INTRAVENOUS | Status: DC
  Administered 2015-07-01: 19:00:00 via INTRAVENOUS

## 2015-07-01 MED ORDER — LACTATED RINGERS IV SOLN
500.0000 mL | INTRAVENOUS | Status: DC | PRN
  Administered 2015-07-01: 500 mL via INTRAVENOUS

## 2015-07-01 MED ORDER — LIDOCAINE HCL (PF) 1 % IJ SOLN
INTRAMUSCULAR | Status: DC | PRN
  Administered 2015-07-01 (×2): 5 mL

## 2015-07-01 MED ORDER — ONDANSETRON HCL 4 MG/2ML IJ SOLN: 4.0000 mg | Freq: Four times a day (QID) | INTRAMUSCULAR | Status: DC | PRN

## 2015-07-01 MED ORDER — ZOLPIDEM TARTRATE 5 MG PO TABS: 5.0000 mg | ORAL_TABLET | Freq: Every evening | ORAL | Status: DC | PRN

## 2015-07-01 MED ORDER — NALBUPHINE HCL 10 MG/ML IJ SOLN: 10.0000 mg | INTRAMUSCULAR | Status: DC | PRN

## 2015-07-01 MED ORDER — FENTANYL 2.5 MCG/ML BUPIVACAINE 1/10 % EPIDURAL INFUSION (WH - ANES)
14.0000 mL/h | INTRAMUSCULAR | Status: DC | PRN
  Administered 2015-07-01 (×2): 14 mL/h via EPIDURAL
  Filled 2015-07-01: qty 125

## 2015-07-01 MED ORDER — DIPHENHYDRAMINE HCL 50 MG/ML IJ SOLN: 12.5000 mg | INTRAMUSCULAR | Status: DC | PRN

## 2015-07-01 MED ORDER — EPHEDRINE 5 MG/ML INJ
10.0000 mg | INTRAVENOUS | Status: DC | PRN
  Filled 2015-07-01: qty 2

## 2015-07-01 MED ORDER — MISOPROSTOL 200 MCG PO TABS
50.0000 ug | ORAL_TABLET | ORAL | Status: DC
  Administered 2015-07-01 (×3): 50 ug via ORAL
  Filled 2015-07-01 (×3): qty 0.5

## 2015-07-01 NOTE — Anesthesia Procedure Notes (Signed)
Epidural Patient location during procedure: OB  Staffing Anesthesiologist: Sivan Quast Performed by: anesthesiologist   Preanesthetic Checklist Completed: patient identified, site marked, surgical consent, pre-op evaluation, timeout performed, IV checked, risks and benefits discussed and monitors and equipment checked  Epidural Patient position: sitting Prep: DuraPrep Patient monitoring: heart rate, continuous pulse ox and blood pressure Approach: right paramedian Location: L3-L4 Injection technique: LOR saline  Needle:  Needle type: Tuohy  Needle gauge: 17 G Needle length: 9 cm and 9 Needle insertion depth: 7 cm Catheter type: closed end flexible Catheter size: 20 Guage Catheter at skin depth: 11 cm Test dose: negative  Assessment Events: blood not aspirated, injection not painful, no injection resistance, negative IV test and no paresthesia  Additional Notes Patient identified. Risks/Benefits/Options discussed with patient including but not limited to bleeding, infection, nerve damage, paralysis, failed block, incomplete pain control, headache, blood pressure changes, nausea, vomiting, reactions to medication both or allergic, itching and postpartum back pain. Confirmed with bedside nurse the patient's most recent platelet count. Confirmed with patient that they are not currently taking any anticoagulation, have any bleeding history or any family history of bleeding disorders. Patient expressed understanding and wished to proceed. All questions were answered. Sterile technique was used throughout the entire procedure. Please see nursing notes for vital signs. Test dose was given through epidural needle and negative prior to continuing to dose epidural or start infusion. Warning signs of high block given to the patient including shortness of breath, tingling/numbness in hands, complete motor block, or any concerning symptoms with instructions to call for help. Patient was given  instructions on fall risk and not to get out of bed. All questions and concerns addressed with instructions to call with any issues.   

## 2015-07-01 NOTE — Progress Notes (Signed)
Subjective:    Nicole Mclean is a 19 y.o. female being seen today for her obstetrical visit. She is at 625w0d gestation. Patient reports no complaints. Fetal movement: normal.  Problem List Items Addressed This Visit    None    Visit Diagnoses    Supervision of other normal pregnancy, antepartum, third trimester    -  Primary    Relevant Orders    POCT urinalysis dipstick (Completed)      Patient Active Problem List   Diagnosis Date Noted  . Other iron deficiency anemias 04/30/2015  . Encounter for fetal anatomic survey   . [redacted] weeks gestation of pregnancy   . Supervision of other normal pregnancy 06/07/2014  . Ruptured, membranes, premature 06/06/2014    Objective:    BP 114/72 mmHg  Pulse 102  Wt 221 lb (100.245 kg)  LMP 10/01/2014 FHT: 140 BPM  Uterine Size: size equals dates  Presentations: cephalic  Pelvic Exam:              Dilation: Closed       Effacement: 50%             Station:  -2    Consistency: soft            Position: posterior     Assessment:    Pregnancy @ 115w0d weeks   Plan:   Plans for delivery: Vaginal anticipated; labs reviewed; problem list updated Counseling: Consent signed. Infant feeding: plans to breastfeed. Cigarette smoking: former smoker. L&D discussion: symptoms of labor, discussed when to call, discussed what number to call, anesthetic/analgesic options reviewed and delivering clinician:  plans no preference. Postpartum supports and preparation: circumcision discussed and contraception plans discussed.  Follow up in 1 Week.

## 2015-07-01 NOTE — H&P (Signed)
Nicole Mclean is a 19 y.o. female presenting for elective IOL, multiparity. Maternal Medical History:  Reason for admission: Elective IOL, multiparity.  Fetal activity: Perceived fetal activity is normal.   Last perceived fetal movement was within the past hour.    Prenatal complications: no prenatal complications Prenatal Complications - Diabetes: none.    OB History    Gravida Para Term Preterm AB TAB SAB Ectopic Multiple Living   3 1 1  0 1 0 1 0 0 1     Past Medical History  Diagnosis Date  . Gonorrhea April 2015   Past Surgical History  Procedure Laterality Date  . Rhytidectomy neck / cheek / chin    . Hand surgery      Left  hand  . Hand surgery      left   . Chin surgery     Family History: family history includes Miscarriages / Stillbirths in her maternal grandmother, mother, and sister. There is no history of Alcohol abuse, Arthritis, Asthma, COPD, Cancer, Birth defects, Depression, Diabetes, Drug abuse, Early death, Hearing loss, Heart disease, Hyperlipidemia, Hypertension, Kidney disease, Learning disabilities, Mental illness, Mental retardation, Stroke, Vision loss, Varicose Veins, or Migraines. Social History:  reports that she quit smoking about 22 months ago. Her smoking use included Cigars. She has never used smokeless tobacco. She reports that she does not drink alcohol or use illicit drugs.   Prenatal Transfer Tool  Maternal Diabetes: No Genetic Screening: Normal Maternal Ultrasounds/Referrals: Normal Fetal Ultrasounds or other Referrals:  None Maternal Substance Abuse:  No Significant Maternal Medications:  None Significant Maternal Lab Results:  None Other Comments:  None  Review of Systems  All other systems reviewed and are negative.   Dilation: 2 Effacement (%): 60 Station: -2 Exam by:: m garrison rn Blood pressure 115/63, pulse 91, temperature 98.3 F (36.8 C), temperature source Oral, resp. rate 16, height 5' (1.524 m), weight 220 lb  (99.791 kg), last menstrual period 10/01/2014, not currently breastfeeding. Maternal Exam:  Abdomen: Patient reports no abdominal tenderness. Fetal presentation: vertex  Introitus: Normal vulva. Normal vagina.  Cervix: Cervix evaluated by digital exam.     Physical Exam  Nursing note and vitals reviewed. Constitutional: She is oriented to person, place, and time. She appears well-developed and well-nourished.  HENT:  Head: Normocephalic and atraumatic.  Eyes: Conjunctivae are normal. Pupils are equal, round, and reactive to light.  Neck: Normal range of motion. Neck supple.  Cardiovascular: Normal rate and regular rhythm.   Respiratory: Effort normal and breath sounds normal.  GI: Soft.  Genitourinary: Vagina normal and uterus normal.  Musculoskeletal: Normal range of motion.  Neurological: She is alert and oriented to person, place, and time.  Skin: Skin is warm and dry.  Psychiatric: She has a normal mood and affect. Her behavior is normal. Judgment and thought content normal.    Prenatal labs: ABO, Rh: --/--/O POS (12/14 1140) Antibody: PENDING (12/14 1140) Rubella: 6.39 (06/28 1354) RPR: NON REAC (09/29 1132)  HBsAg: NEGATIVE (06/28 1354)  HIV: NONREACTIVE (09/29 1132)  GBS: NOT DETECTED (11/17 1143)   Assessment/Plan: 39 weeks.  Multiparity.  Favorable cervix.  Elective IOL.   HARPER,CHARLES A 07/01/2015, 1:31 PM

## 2015-07-01 NOTE — Anesthesia Preprocedure Evaluation (Signed)

## 2015-07-02 ENCOUNTER — Encounter (HOSPITAL_COMMUNITY): Payer: Self-pay

## 2015-07-02 LAB — RPR: RPR Ser Ql: NONREACTIVE

## 2015-07-02 MED ORDER — ACETAMINOPHEN 325 MG PO TABS: 650.0000 mg | ORAL_TABLET | ORAL | Status: DC | PRN

## 2015-07-02 MED ORDER — METHYLERGONOVINE MALEATE 0.2 MG/ML IJ SOLN: 0.2000 mg | INTRAMUSCULAR | Status: DC | PRN

## 2015-07-02 MED ORDER — BENZOCAINE-MENTHOL 20-0.5 % EX AERO
1.0000 "application " | INHALATION_SPRAY | CUTANEOUS | Status: DC | PRN
  Administered 2015-07-03: 1 via TOPICAL
  Filled 2015-07-02: qty 56

## 2015-07-02 MED ORDER — ZOLPIDEM TARTRATE 5 MG PO TABS: 5.0000 mg | ORAL_TABLET | Freq: Every evening | ORAL | Status: DC | PRN

## 2015-07-02 MED ORDER — TETANUS-DIPHTH-ACELL PERTUSSIS 5-2.5-18.5 LF-MCG/0.5 IM SUSP
0.5000 mL | Freq: Once | INTRAMUSCULAR | Status: AC
  Administered 2015-07-02: 0.5 mL via INTRAMUSCULAR
  Filled 2015-07-02: qty 0.5

## 2015-07-02 MED ORDER — INFLUENZA VAC SPLIT QUAD 0.5 ML IM SUSY
0.5000 mL | PREFILLED_SYRINGE | INTRAMUSCULAR | Status: AC
  Administered 2015-07-02: 0.5 mL via INTRAMUSCULAR
  Filled 2015-07-02: qty 0.5

## 2015-07-02 MED ORDER — OXYTOCIN 40 UNITS IN LACTATED RINGERS INFUSION - SIMPLE MED: 62.5000 mL/h | INTRAVENOUS | Status: DC | PRN

## 2015-07-02 MED ORDER — OXYCODONE-ACETAMINOPHEN 5-325 MG PO TABS
1.0000 | ORAL_TABLET | ORAL | Status: DC | PRN
  Administered 2015-07-02: 1 via ORAL
  Filled 2015-07-02: qty 1

## 2015-07-02 MED ORDER — LANOLIN HYDROUS EX OINT: TOPICAL_OINTMENT | CUTANEOUS | Status: DC | PRN

## 2015-07-02 MED ORDER — OXYCODONE-ACETAMINOPHEN 5-325 MG PO TABS: 2.0000 | ORAL_TABLET | ORAL | Status: DC | PRN

## 2015-07-02 MED ORDER — SENNOSIDES-DOCUSATE SODIUM 8.6-50 MG PO TABS
2.0000 | ORAL_TABLET | ORAL | Status: DC
  Administered 2015-07-03: 2 via ORAL
  Filled 2015-07-02: qty 2

## 2015-07-02 MED ORDER — SIMETHICONE 80 MG PO CHEW: 80.0000 mg | CHEWABLE_TABLET | ORAL | Status: DC | PRN

## 2015-07-02 MED ORDER — ONDANSETRON HCL 4 MG PO TABS: 4.0000 mg | ORAL_TABLET | ORAL | Status: DC | PRN

## 2015-07-02 MED ORDER — DIBUCAINE 1 % RE OINT: 1.0000 "application " | TOPICAL_OINTMENT | RECTAL | Status: DC | PRN

## 2015-07-02 MED ORDER — IBUPROFEN 600 MG PO TABS
600.0000 mg | ORAL_TABLET | Freq: Four times a day (QID) | ORAL | Status: DC
  Administered 2015-07-02 – 2015-07-03 (×5): 600 mg via ORAL
  Filled 2015-07-02 (×5): qty 1

## 2015-07-02 MED ORDER — METHYLERGONOVINE MALEATE 0.2 MG PO TABS: 0.2000 mg | ORAL_TABLET | ORAL | Status: DC | PRN

## 2015-07-02 MED ORDER — DIPHENHYDRAMINE HCL 25 MG PO CAPS: 25.0000 mg | ORAL_CAPSULE | Freq: Four times a day (QID) | ORAL | Status: DC | PRN

## 2015-07-02 MED ORDER — ONDANSETRON HCL 4 MG/2ML IJ SOLN: 4.0000 mg | INTRAMUSCULAR | Status: DC | PRN

## 2015-07-02 MED ORDER — PRENATAL MULTIVITAMIN CH
1.0000 | ORAL_TABLET | Freq: Every day | ORAL | Status: DC
  Administered 2015-07-02 – 2015-07-03 (×2): 1 via ORAL
  Filled 2015-07-02 (×2): qty 1

## 2015-07-02 MED ORDER — WITCH HAZEL-GLYCERIN EX PADS: 1.0000 "application " | MEDICATED_PAD | CUTANEOUS | Status: DC | PRN

## 2015-07-02 NOTE — Progress Notes (Signed)
UR chart review completed.  

## 2015-07-02 NOTE — Lactation Note (Signed)
[facility] Lactation Consultation Note  Mom reports that BF is going well thus far.  She has a history of engorgement prior to discharge and low milk supply after the first month.  Suspect that baby was not transferring well prior to leaving the hospital.  Discussed strategy of BF often, listening for swallows and having staff observed LATCH. Mom aware of cue based feeding. Hand expression reviewed with colostrum easily expressed.  Encouraged to use lactation resources after discharge.  Follow-up tomorrow.  Patient Name: Boy Gwendel HansonHope Delucia ZOXWR'UToday's Date: 07/02/2015 Reason for consult: Initial assessment   Maternal Data    Feeding Feeding Type: Breast Fed Length of feed: 30 min  LATCH Score/Interventions                      Lactation Tools Discussed/Used     Consult Status Consult Status: Follow-up Date: 07/03/15 Follow-up type: In-patient    Soyla DryerJoseph, Blase Beckner 07/02/2015, 10:45 AM

## 2015-07-02 NOTE — Anesthesia Postprocedure Evaluation (Signed)
Anesthesia Post Note  Patient: Nicole Mclean  Procedure(s) Performed: * No procedures listed *  Patient location during evaluation: Mother Baby Anesthesia Type: Epidural Level of consciousness: awake and alert Pain management: satisfactory to patient Vital Signs Assessment: post-procedure vital signs reviewed and stable Respiratory status: respiratory function stable Cardiovascular status: stable Postop Assessment: no headache, no backache, epidural receding, patient able to bend at knees, no signs of nausea or vomiting and adequate PO intake Anesthetic complications: no    Last Vitals:  Filed Vitals:   07/02/15 0615 07/02/15 0730  BP: 108/53 95/62  Pulse: 92 88  Temp: 37 C 36.8 C  Resp: 18 20    Last Pain:  Filed Vitals:   07/02/15 0810  PainSc: 0-No pain                 Deshane Cotroneo

## 2015-07-03 ENCOUNTER — Other Ambulatory Visit: Payer: Self-pay | Admitting: Certified Nurse Midwife

## 2015-07-03 LAB — CBC
HCT: 29.9 % — ABNORMAL LOW (ref 36.0–46.0)
Hemoglobin: 10 g/dL — ABNORMAL LOW (ref 12.0–15.0)
MCH: 29.2 pg (ref 26.0–34.0)
MCHC: 33.4 g/dL (ref 30.0–36.0)
MCV: 87.2 fL (ref 78.0–100.0)
PLATELETS: 235 10*3/uL (ref 150–400)
RBC: 3.43 MIL/uL — ABNORMAL LOW (ref 3.87–5.11)
RDW: 14.5 % (ref 11.5–15.5)
WBC: 9.8 10*3/uL (ref 4.0–10.5)

## 2015-07-03 MED ORDER — ONDANSETRON HCL 4 MG PO TABS: 4.0000 mg | ORAL_TABLET | ORAL | Status: DC | PRN

## 2015-07-03 MED ORDER — LANOLIN HYDROUS EX OINT: 1.0000 "application " | TOPICAL_OINTMENT | CUTANEOUS | Status: DC | PRN

## 2015-07-03 MED ORDER — OXYCODONE-ACETAMINOPHEN 5-325 MG PO TABS: 2.0000 | ORAL_TABLET | ORAL | Status: DC | PRN

## 2015-07-03 MED ORDER — SENNOSIDES-DOCUSATE SODIUM 8.6-50 MG PO TABS: 2.0000 | ORAL_TABLET | Freq: Every evening | ORAL | Status: DC | PRN

## 2015-07-03 MED ORDER — WITCH HAZEL-GLYCERIN EX PADS: 1.0000 "application " | MEDICATED_PAD | CUTANEOUS | Status: DC | PRN

## 2015-07-03 NOTE — Discharge Summary (Signed)
Obstetric Discharge Summary Reason for Admission: induction of labor Prenatal Procedures: ultrasound Intrapartum Procedures: spontaneous vaginal delivery Postpartum Procedures: none Complications-Operative and Postpartum: none HEMOGLOBIN  Date Value Ref Range Status  07/03/2015 10.0* 12.0 - 15.0 g/dL Final   HCT  Date Value Ref Range Status  07/03/2015 29.9* 36.0 - 46.0 % Final    Physical Exam:  General: alert, cooperative and no distress Lochia: appropriate Uterine Fundus: firm Incision: none DVT Evaluation: No evidence of DVT seen on physical exam. No significant calf/ankle edema. Positive Homan's sign.  Discharge Diagnoses: Term Pregnancy-delivered  Discharge Information: Date: 07/03/2015 Activity: pelvic rest Diet: routine Medications: Ibuprofen, Colace, Iron and Percocet Condition: stable Instructions: refer to practice specific booklet Discharge to: home   Newborn Data: Live born female  Birth Weight: 6 lb 12.5 oz (3075 g) APGAR: 8, 9  Home with mother.  Nicole Mclean Nicole Mclean 07/03/2015, 9:11 AM

## 2015-07-03 NOTE — Discharge Instructions (Signed)
Postpartum Care After Vaginal Delivery After you deliver your baby, you will stay in the hospital for 24 to 72 hours, unless there were problems with the labor or delivery, or you have medical problems. While you are in the hospital, you will receive help and instructions on how to care for yourself and your baby. Your doctor will order pain medicine, in case you need it. You will have a small amount of bleeding from your vagina and should change your sanitary pad frequently. Wash your hands thoroughly with soap and water for at least 20 seconds after changing pads and using the toilet. Let the nurses know if you begin to pass blood clots or your bleeding increases. Do not flush blood clots down the toilet before having the nurse look at them, to make sure there is no placental tissue with them. If you had an intravenous, it will be removed within 24 hours, if there are no problems. The first time you get out of bed or take a shower, call the nurse to help you because you may get weak, lightheaded, or even faint. If you are breastfeeding, you may feel painful contractions of your uterus for a couple of weeks. This is normal. The contractions help your uterus get back to normal size. If you are not breastfeeding, wear a tight, binding bra and decrease your fluid intake. You may be given a medicine to dry up the milk in your breasts. Hormones should not be given to dry up the breasts, because they can cause blood clots. You will be given your normal diet, unless you have diabetes or other medical problems.  The nurses may put an ice pack on your episiotomy (surgically enlarged opening), if you have one, to reduce the pain and puffiness (swelling). On rare occasions, you may not be able to urinate and the nurse will need to empty your bladder with a catheter. If you had a postpartum tubal ligation ("tying tubes," female sterilization), it should not make your stay in the hospital longer. You may have your baby in  your room with you as much as you like, unless you or the baby has a problem. Use the bassinet (basket) for the baby when going to and from the nursery. Do not carry the baby. Do not leave the postpartum area. If the mother is Rh negative (lacks a protein on the red blood cells) and the baby is Rh positive, the mother should get a Rho-gam shot to prevent Rh problems with future pregnancies. You may be given written instructions for you and your baby, and necessary medicines, when you are discharged from the hospital. Be sure you understand and follow the instructions as advised. HOME CARE INSTRUCTIONS AFTER YOUR DELIVERY:  Follow instructions and take the medicines given to you.   Only take over-the-counter or prescription medicines for pain, discomfort, or fever as directed by your caregiver.   Do not take aspirin, because it can cause bleeding.   Increase your activities a little bit every day to build up your strength and endurance.   Do not drink alcohol, especially if you are breastfeeding or taking pain medicine.   Take your temperature twice a day and record it.   You may have a small amount of bleeding or spotting for 2 to 4 weeks. This is normal.   Do not use tampons or douche. Use sanitary pads.   Try to have someone stay and help you for a few days when you go home.     Try to rest or take a nap when the baby is sleeping.   If you are breastfeeding, wear a good support bra. If you are not breastfeeding, wear a tight bra, do not stimulate your nipples, and decrease your fluid intake.   Eat a healthy, nutritious diet and continue to take your prenatal vitamins.   Do not drive, do any heavy activities or travel until your caregiver tells you it is OK.   Do not have intercourse until your caregiver gives you permission to do so.   Ask your caregiver when you can begin to exercise and what type of exercises to do.   Call your caregiver if you think you are having a problem from  your delivery.   Call your pediatrician if you are having a problem with the baby.   Schedule your postpartum visit and keep it.  SEEK MEDICAL CARE IF:  You have a temperature of 100 F (37.8 C) or higher.   You have increased vaginal bleeding or are passing clots. Save any clots to show your caregiver.   You have bloody urine, or pain when you urinate.   You have a bad smelling vaginal discharge.   You have increasing pain or swelling on your episiotomy (surgically enlarged opening).   You develop a severe headache.   You feel depressed.   The episiotomy is separating.   You become dizzy or lightheaded.   You develop a rash.   You have a reaction or problems with your medicine.   You have pain, redness, and/or swelling at the intravenous site.  SEEK IMMEDIATE MEDICAL CARE IF:  You have chest pain.   You develop shortness of breath.   You pass out.   You develop pain, with or without swelling or redness in your leg.   You develop heavy vaginal bleeding, with or without blood clots.   You develop stomach pain.   You develop a bad smelling vaginal discharge.  MAKE SURE YOU:   Understand these instructions.   Will watch your condition.   Will get help right away if you are not doing well or get worse.  Document Released: 05/01/2007 Document Re-Released: 06/22/2009 Exit[employer] Patient Information 2011 [employer]. 

## 2015-07-03 NOTE — Clinical Social Work Maternal (Signed)
CLINICAL SOCIAL WORK MATERNAL/CHILD NOTE  Patient Details  Name: Nicole Mclean MRN: [id number] Date of Birth: 03/23/1996  Date:  07/03/2015  Clinical Social Worker Initiating Note:  Nicole Mclean MSW, LCSW Date/ Time Initiated:  07/03/15/1100     Child's Name:  Nicole Mclean   Legal Guardian:  Nicole Mclean and Nicole Mclean  Need for Interpreter:  None   Date of Referral:  07/03/15     Reason for Referral:  Concerns about verbal conflict between Nicole Mclean and FOB  Referral Source:  CNM   Address:  [address] [place], [place] [address]  Phone number:  [phone]   Household Members:  Minor Children   Natural Supports (not living in the home):  Extended Family, Immediate Family   Professional Supports: Doula at [employer]  Employment:     Type of Work:     Education:      Architect:  [employer]   Other Resources:  [employer]   Cultural/Religious Considerations Which May Impact Care:  None reported  Strengths:  Home prepared for child , Merchandiser, retail , Ability to meet basic needs    Risk Factors/Current Problems:  Family/Relationship Issues    Cognitive State:  Able to Concentrate , Alert , Goal Oriented , Linear Thinking , Insightful    Mood/Affect:  Calm , Comfortable    CSW Assessment:  CSW received request for consult due to concern from CNM regarding arguing that occurred between [name] and FOB while [name] was on the phone with him.  CSW able to complete majority of assessment with [name] alone.  She presented in a pleasant mood, full range in affect noted, and she was observed to be breastfeeding the infant during the assessment.    [name] stated that she lives alone with her other child who is 45 months old.  She shared that she and the FOB are "co-parenting", and stated that they do not live together.  [name] reported that she has family support that lives nearby who will be offering her support once she returns home.  [name] stated that she and the FOB continue to  have a highly strained relationship, and expressed frustration in his behaviors. She discussed his prior behaviors, and shared that she has low expectations for his behaviors. [name] reported that she previously had high expectations, and quickly became frustrated, overwhelmed, and stressed since she realized that it was unlikely for him to change.  [name] stated that he continues to have a difficult time co-parenting in accordance to how she wants him to, but stated that she continues to readjust her expectations for him since her perspective of him is within her control.  [name] denied any history of domestic violence, and denied presence of any safety concerns.   [name] reported postpartum depression after her first child was born. She presented with insight as she was able to discuss how depression was closely linked to reality contrasting strongly from her expectations.  [name] reported that she had expectations for the FOB to be involved, but stated that she felt depressed when this did not occur. She shared that she was also overwhelmed as a first time mother. Per [name], symptoms lasted for 1-2 months, and she never needed treatment.  She shared that talking to her natural supports was effective for her.  [name] stated that she feels "better" during this transition postpartum since she does not have high expectations for the FOB and she knows what to anticipate with caring for a newborn. [name] reported feeling  tired, but stated that she feels "okay".   [name] agreed to closely monitor her mood, to discuss her feelings with her support system, and to notify her medical providers if she notes onset of symptoms.  FOB entered the room during the end of the assessment, and CSW noted tension and strained relationship. [name] and FOB presented as annoyed with one another.  CSW Plan/Description:   1.Patient/Family Education: Perinatal mood and anxiety disorders  2.No Further Intervention Required/No Barriers to Discharge     Nicole Mclean, Nicole Diego N, LCSW 07/03/2015, 1:23 PM

## 2015-07-08 ENCOUNTER — Other Ambulatory Visit: Payer: Self-pay | Admitting: Certified Nurse Midwife

## 2015-07-08 DIAGNOSIS — O4390 Unspecified placental disorder, unspecified trimester: Secondary | ICD-10-CM | POA: Insufficient documentation

## 2015-07-15 ENCOUNTER — Ambulatory Visit (INDEPENDENT_AMBULATORY_CARE_PROVIDER_SITE_OTHER): Payer: Medicaid Other | Admitting: Certified Nurse Midwife

## 2015-07-15 ENCOUNTER — Encounter: Payer: Self-pay | Admitting: Certified Nurse Midwife

## 2015-07-15 NOTE — Progress Notes (Signed)
Patient ID: Nicole Mclean, female   DOB: [birth year], 19 y.o.   MRN: [id number]  Subjective:     Nicole Mclean is a 19 y.o. female who presents for a postpartum visit. She is 2 weeks postpartum following a spontaneous vaginal delivery. I have fully reviewed the prenatal and intrapartum course. The delivery was at 39 gestational weeks. Outcome: spontaneous vaginal delivery. Anesthesia: epidural. Postpartum course has been normal. Baby's course has been normal. Baby is feeding by breast. Bleeding thin lochia and brown. Bowel function is normal. Bladder function is normal. Patient is not sexually active. Contraception method is abstinence. Postpartum depression screening: negative.  Tobacco, alcohol and substance abuse history reviewed.  Adult immunizations reviewed including TDAP, rubella and varicella.  The following portions of the patient's history were reviewed and updated as appropriate: allergies, current medications, past family history, past medical history, past social history, past surgical history and problem list.  Review of Systems Pertinent items noted in HPI and remainder of comprehensive ROS otherwise negative.   Objective:    BP 113/71 mmHg  Pulse 80  Temp(Src) 98.2 F (36.8 C)  Wt 202 lb (91.627 kg)  Breastfeeding? Yes  General:  alert, cooperative and no distress   Breasts:  inspection negative, no nipple discharge or bleeding, no masses or nodularity palpable  Lungs: clear to auscultation bilaterally  Heart:  regular rate and rhythm, S1, S2 normal, no murmur, click, rub or gallop  Abdomen: soft, non-tender; bowel sounds normal; no masses,  no organomegaly   Vulva:  not evaluated  Vagina: not evaluated  Cervix:  not evaluated  Corpus: not examined  Adnexa:  not evaluated  Rectal Exam: Not performed.          50% of 15 min visit spent on counseling and coordination of care.  Assessment:     Normal 2 week postpartum exam. Pap smear not done at today's visit.    Contraception counseling Plan:    1. Contraception: abstinence 2. F/u with IUD insertion in 4 weeks 3. Follow up in: 4 weeks or as needed.  2hr GTT for h/o GDM/screening for DM q 3 yrs per ADA recommendations Preconception counseling provided Healthy lifestyle practices reviewed

## 2015-07-23 ENCOUNTER — Telehealth: Payer: Self-pay | Admitting: *Deleted

## 2015-07-23 NOTE — Telephone Encounter (Signed)
Patient states she has her appointment for her postpartum visit scheduled for August 12, 2015. Patient wanted to know if she could have her Mirena IUD inserted on that same day. Patient advise that she was able to have that done on the same day. Patient advised not to resume intercourse prior to her appointment. Patient verbalized understanding.

## 2015-08-12 ENCOUNTER — Ambulatory Visit (INDEPENDENT_AMBULATORY_CARE_PROVIDER_SITE_OTHER): Payer: Medicaid Other | Admitting: Certified Nurse Midwife

## 2015-08-12 VITALS — BP 111/74 | HR 56 | Wt 200.0 lb

## 2015-08-12 DIAGNOSIS — K640 First degree hemorrhoids: Secondary | ICD-10-CM

## 2015-08-12 DIAGNOSIS — Z975 Presence of (intrauterine) contraceptive device: Secondary | ICD-10-CM | POA: Insufficient documentation

## 2015-08-12 DIAGNOSIS — Z30430 Encounter for insertion of intrauterine contraceptive device: Secondary | ICD-10-CM

## 2015-08-12 DIAGNOSIS — Z3202 Encounter for pregnancy test, result negative: Secondary | ICD-10-CM

## 2015-08-12 LAB — POCT URINE PREGNANCY: Preg Test, Ur: NEGATIVE

## 2015-08-12 MED ORDER — HYDROCORTISONE ACETATE 25 MG RE SUPP: 25.0000 mg | Freq: Two times a day (BID) | RECTAL | Status: DC

## 2015-08-12 MED ORDER — DOCUSATE SODIUM 100 MG PO CAPS: 100.0000 mg | ORAL_CAPSULE | Freq: Two times a day (BID) | ORAL | Status: DC

## 2015-08-12 NOTE — Progress Notes (Signed)
Patient ID: Nicole Mclean, female   DOB: [birth year], 20 y.o.   MRN: [id number]  IUD Procedure Note   DIAGNOSIS: Desires long-term, reversible contraception   PROCEDURE: IUD placement Performing Provider: Orvilla Cornwall CNM  Patient counseled prior to procedure. I explained risks and benefits of Mirena IUD, reviewed alternative forms of contraception. Patient stated understanding and consented to continue with procedure.   LMP: unknown, postpartum Pregnancy Test: Negative Lot #: [id number] Expiration Date: 06/19   IUD type:  Mirena   [   ] Paraguard  [  ] Christean Grief   [  ]  Kyleena  PROCEDURE:  Timeout procedure was performed to ensure right patient and right site.  A bimanual exam was performed to determine the position of the uterus, retroverted. The speculum was placed. The vagina and cervix was sterilized in the usual manner and sterile technique was maintained throughout the course of the procedure. A single toothed tenaculum was not required. The depth of the uterus was sounded to 9 cm. The IUD was inserted to the appropriate depth and inserted without difficulty.  The string was cut to an estimated 4 cm length. Bleeding was minimal. The patient tolerated the procedure well.   Follow up: The patient tolerated the procedure well without complications.  Standard post-procedure care isPARRIE RASCO and return precautions are given.  Orvilla Cornwall CNM

## 2015-08-12 NOTE — Progress Notes (Signed)
Patient ID: Nicole Mclean, female   DOB: 1995/08/27, 20 y.o.   MRN: [id number]  Subjective:     Nicole Mclean is a 20 y.o. female who presents for a postpartum visit. She is 6 weeks postpartum following a spontaneous vaginal delivery. I have fully reviewed the prenatal and intrapartum course. The delivery was at 39 gestational weeks. Outcome: spontaneous vaginal delivery. Anesthesia: epidural. Postpartum course has been normal. Baby's course has been normal, infant had thrush. Baby is feeding by bottle - Enfamil with Iron.  Patient had nipple candidiasis and her breast milk dried up before the infant would latch after having thrush.  Bleeding thin lochia and clear. Bowel function is normal. Bladder function is normal. Patient is not sexually active. Contraception method is abstinence. Postpartum depression screening: negative.  Tobacco, alcohol and substance abuse history reviewed.  Adult immunizations reviewed including TDAP, rubella and varicella.  The following portions of the patient's history were reviewed and updated as appropriate: allergies, current medications, past family history, past medical history, past social history, past surgical history and problem list.  Review of Systems Pertinent items noted in HPI and remainder of comprehensive ROS otherwise negative.   Objective:    BP 111/74 mmHg  Pulse 56  Wt 200 lb (90.719 kg)  General:  alert, cooperative and no distress   Breasts:  inspection negative, no nipple discharge or bleeding, no masses or nodularity palpable  Lungs: clear to auscultation bilaterally  Heart:  regular rate and rhythm, S1, S2 normal, no murmur, click, rub or gallop  Abdomen: soft, non-tender; bowel sounds normal; no masses,  no organomegaly   Vulva:  normal  Vagina: normal vagina, no discharge, exudate, lesion, or erythema  Cervix:  no cervical motion tenderness and retroverted  Corpus: normal size, contour, position, consistency, mobility, non-tender  Adnexa:   normal adnexa  Rectal Exam: Not performed.          50% of 30 min visit spent on counseling and coordination of care.  Assessment:     Normal 6 week postpartum exam. Pap smear not done at today's visit.  Plan:    1. Contraception: abstinence 2. IUD placed today.  3. Follow up in: 1 month or as needed.  2hr GTT for h/o GDM/screening for DM q 3 yrs per ADA recommendations Preconception counseling provided Healthy lifestyle practices reviewed

## 2015-08-16 LAB — SURESWAB, VAGINOSIS/VAGINITIS PLUS
Atopobium vaginae: 5.4 Log (cells/mL)
C. GLABRATA, DNA: NOT DETECTED
C. TRACHOMATIS RNA, TMA: NOT DETECTED
C. TROPICALIS, DNA: NOT DETECTED
C. albicans, DNA: DETECTED — AB
C. parapsilosis, DNA: NOT DETECTED
GARDNERELLA VAGINALIS: 7.2 Log (cells/mL)
LACTOBACILLUS SPECIES: NOT DETECTED Log (cells/mL)
MEGASPHAERA SPECIES: 6.4 Log (cells/mL)
N. gonorrhoeae RNA, TMA: DETECTED — AB
T. VAGINALIS RNA, QL TMA: NOT DETECTED

## 2015-08-17 ENCOUNTER — Other Ambulatory Visit: Payer: Self-pay | Admitting: Certified Nurse Midwife

## 2015-08-17 DIAGNOSIS — A549 Gonococcal infection, unspecified: Secondary | ICD-10-CM

## 2015-08-17 DIAGNOSIS — B373 Candidiasis of vulva and vagina: Secondary | ICD-10-CM

## 2015-08-17 DIAGNOSIS — B3731 Acute candidiasis of vulva and vagina: Secondary | ICD-10-CM

## 2015-08-17 MED ORDER — FLUCONAZOLE 100 MG PO TABS: 100.0000 mg | ORAL_TABLET | Freq: Once | ORAL | Status: DC

## 2015-08-17 MED ORDER — TERCONAZOLE 0.4 % VA CREA: 1.0000 | TOPICAL_CREAM | Freq: Every day | VAGINAL | Status: DC

## 2015-08-17 MED ORDER — TINIDAZOLE 500 MG PO TABS: 2.0000 g | ORAL_TABLET | Freq: Every day | ORAL | Status: AC

## 2015-08-18 ENCOUNTER — Encounter: Payer: Self-pay | Admitting: Obstetrics

## 2015-08-19 ENCOUNTER — Ambulatory Visit (INDEPENDENT_AMBULATORY_CARE_PROVIDER_SITE_OTHER): Payer: Medicaid Other | Admitting: *Deleted

## 2015-08-19 VITALS — BP 120/69 | HR 53 | Temp 98.5°F | Wt 199.0 lb

## 2015-08-19 DIAGNOSIS — A549 Gonococcal infection, unspecified: Secondary | ICD-10-CM | POA: Diagnosis not present

## 2015-08-19 MED ORDER — CEFTRIAXONE SODIUM 1 G IJ SOLR
250.0000 mg | Freq: Once | INTRAMUSCULAR | Status: AC
  Administered 2015-08-19: 250 mg via INTRAMUSCULAR

## 2015-08-19 NOTE — Progress Notes (Signed)
Patient in office for a Rocephin injection for treatment of Gonorrhea. Patient advised to abstain from intercourse and to have a test of cure in 2-3 months. Patient tolerated injection well.  BP 120/69 mmHg  Pulse 53  Temp(Src) 98.5 F (36.9 C)  Wt 199 lb (90.266 kg)  Breastfeeding? No

## 2015-09-02 ENCOUNTER — Encounter (HOSPITAL_COMMUNITY): Payer: Self-pay | Admitting: Emergency Medicine

## 2015-09-02 ENCOUNTER — Emergency Department (HOSPITAL_COMMUNITY)
Admission: EM | Admit: 2015-09-02 | Discharge: 2015-09-02 | Disposition: A | Discharge status: Home / Self Care | Payer: Medicaid Other | Attending: Emergency Medicine | Admitting: Emergency Medicine

## 2015-09-02 DIAGNOSIS — M542 Cervicalgia: Secondary | ICD-10-CM

## 2015-09-02 DIAGNOSIS — Y998 Other external cause status: Secondary | ICD-10-CM | POA: Insufficient documentation

## 2015-09-02 DIAGNOSIS — S59911A Unspecified injury of right forearm, initial encounter: Secondary | ICD-10-CM | POA: Insufficient documentation

## 2015-09-02 DIAGNOSIS — M79601 Pain in right arm: Secondary | ICD-10-CM

## 2015-09-02 DIAGNOSIS — Z87891 Personal history of nicotine dependence: Secondary | ICD-10-CM | POA: Insufficient documentation

## 2015-09-02 DIAGNOSIS — Z8619 Personal history of other infectious and parasitic diseases: Secondary | ICD-10-CM | POA: Diagnosis not present

## 2015-09-02 DIAGNOSIS — Y92410 Unspecified street and highway as the place of occurrence of the external cause: Secondary | ICD-10-CM | POA: Diagnosis not present

## 2015-09-02 DIAGNOSIS — S199XXA Unspecified injury of neck, initial encounter: Secondary | ICD-10-CM | POA: Diagnosis not present

## 2015-09-02 DIAGNOSIS — Y9389 Activity, other specified: Secondary | ICD-10-CM | POA: Insufficient documentation

## 2015-09-02 MED ORDER — IBUPROFEN 800 MG PO TABS
800.0000 mg | ORAL_TABLET | Freq: Three times a day (TID) | ORAL | Status: DC
Start: 2015-09-02 — End: 2015-09-07

## 2015-09-02 MED ORDER — METHOCARBAMOL 500 MG PO TABS: 500.0000 mg | ORAL_TABLET | Freq: Two times a day (BID) | ORAL | Status: DC

## 2015-09-02 MED ORDER — IBUPROFEN 400 MG PO TABS
800.0000 mg | ORAL_TABLET | Freq: Once | ORAL | Status: AC
  Administered 2015-09-02: 800 mg via ORAL
  Filled 2015-09-02: qty 2

## 2015-09-02 NOTE — ED Notes (Signed)
Declined W/C at D/C and was escorted to lobby by RN. 
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
## 2015-09-07 ENCOUNTER — Ambulatory Visit (INDEPENDENT_AMBULATORY_CARE_PROVIDER_SITE_OTHER): Payer: Medicaid Other | Admitting: Obstetrics

## 2015-09-07 ENCOUNTER — Encounter: Payer: Self-pay | Admitting: Obstetrics

## 2015-09-07 VITALS — BP 121/67 | HR 75 | Temp 98.3°F | Wt 201.0 lb

## 2015-09-07 DIAGNOSIS — N730 Acute parametritis and pelvic cellulitis: Secondary | ICD-10-CM | POA: Diagnosis not present

## 2015-09-07 DIAGNOSIS — A549 Gonococcal infection, unspecified: Secondary | ICD-10-CM

## 2015-09-07 MED ORDER — IBUPROFEN 800 MG PO TABS: 800.0000 mg | ORAL_TABLET | Freq: Three times a day (TID) | ORAL | Status: DC | PRN

## 2015-09-07 MED ORDER — METRONIDAZOLE 500 MG PO TABS: 500.0000 mg | ORAL_TABLET | Freq: Two times a day (BID) | ORAL | Status: DC

## 2015-09-07 MED ORDER — DOXYCYCLINE HYCLATE 100 MG PO CAPS: 100.0000 mg | ORAL_CAPSULE | Freq: Two times a day (BID) | ORAL | Status: DC

## 2015-09-07 NOTE — Progress Notes (Signed)
Patient ID: Nicole Mclean, female   DOB: [birth year], 20 y.o.   MRN: [id number]  Chief Complaint  Patient presents with  . Problem    Bleeding with Mirena IUD. Heavy bleeding x 3 weeks. Patient states she has been passing clots and cramping terribly.     HPI Nicole Mclean is a 20 y.o. female.  Heavy bleeding after Mirena IUD insertion and +GC infection, treated.  HPI  Past Medical History  Diagnosis Date  . Gonorrhea April 2015    Past Surgical History  Procedure Laterality Date  . Rhytidectomy neck / cheek / chin    . Hand surgery      Left  hand  . Hand surgery      left   . Chin surgery      Family History  Problem Relation Age of Onset  . Miscarriages / [place] Mother   . Miscarriages / Stillbirths Sister   . Miscarriages / Stillbirths Maternal Grandmother   . Alcohol abuse Neg Hx   . Arthritis Neg Hx   . Asthma Neg Hx   . COPD Neg Hx   . Cancer Neg Hx   . Birth defects Neg Hx   . Depression Neg Hx   . Diabetes Neg Hx   . Drug abuse Neg Hx   . Early death Neg Hx   . Hearing loss Neg Hx   . Heart disease Neg Hx   . Hyperlipidemia Neg Hx   . Hypertension Neg Hx   . Kidney disease Neg Hx   . Learning disabilities Neg Hx   . Mental illness Neg Hx   . Mental retardation Neg Hx   . Stroke Neg Hx   . Vision loss Neg Hx   . Varicose Veins Neg Hx   . Migraines Neg Hx     Social History Social History  Substance Use Topics  . Smoking status: Former Smoker    Types: Cigars    Quit date: 08/04/2013  . Smokeless tobacco: Never Used  . Alcohol Use: No    No Known Allergies  Current Outpatient Prescriptions  Medication Sig Dispense Refill  . ibuprofen (ADVIL,MOTRIN) 800 MG tablet Take 1 tablet (800 mg total) by mouth every 8 (eight) hours as needed for mild pain or moderate pain. 30 tablet 5  . doxycycline (VIBRAMYCIN) 100 MG capsule Take 1 capsule (100 mg total) by mouth 2 (two) times daily. 28 capsule 1  . metroNIDAZOLE (FLAGYL) 500 MG tablet Take 1  tablet (500 mg total) by mouth 2 (two) times daily. 28 tablet 1   No current facility-administered medications for this visit.    Review of Systems Review of Systems Constitutional: negative for fatigue and weight loss Respiratory: negative for cough and wheezing Cardiovascular: negative for chest pain, fatigue and palpitations Gastrointestinal: negative for abdominal pain and change in bowel habits Genitourinary: positive for heavy vaginal bleeding and cramping Integument/breast: negative for nipple discharge Musculoskeletal:negative for myalgias Neurological: negative for gait problems and tremors Behavioral/Psych: negative for abusive relationship, depression Endocrine: negative for temperature intolerance     Blood pressure 121/67, pulse 75, temperature 98.3 F (36.8 C), weight 201 lb (91.173 kg), last menstrual period 08/13/2015, not currently breastfeeding.  Physical Exam Physical Exam: Deferred  100% of 15 min visit spent on counseling and coordination of care.   Data Reviewed STD cultures  Assessment     PID.  S/P GC infection.  S/P Mirena IUD insertion a month ago AUB.  May be related to  Mirena IUD break - in course and GC Dysmenorrhea    Plan    Doxy / Flagyl x 14 days Taper Lo Loestrin 24 OCP q 8 hours x 8 days  Ibuprofen Rx F/U in 2 weeks  No orders of the defined types were placed in this encounter.   Meds ordered this encounter  Medications  . doxycycline (VIBRAMYCIN) 100 MG capsule    Sig: Take 1 capsule (100 mg total) by mouth 2 (two) times daily.    Dispense:  28 capsule    Refill:  1  . metroNIDAZOLE (FLAGYL) 500 MG tablet    Sig: Take 1 tablet (500 mg total) by mouth 2 (two) times daily.    Dispense:  28 tablet    Refill:  1  . ibuprofen (ADVIL,MOTRIN) 800 MG tablet    Sig: Take 1 tablet (800 mg total) by mouth every 8 (eight) hours as needed for mild pain or moderate pain.    Dispense:  30 tablet    Refill:  5

## 2015-09-09 ENCOUNTER — Ambulatory Visit: Payer: Self-pay | Admitting: Certified Nurse Midwife

## 2015-09-17 ENCOUNTER — Ambulatory Visit: Payer: Medicaid Other | Admitting: Obstetrics

## 2015-09-21 ENCOUNTER — Ambulatory Visit (INDEPENDENT_AMBULATORY_CARE_PROVIDER_SITE_OTHER): Payer: Medicaid Other | Admitting: Obstetrics

## 2015-09-21 VITALS — BP 108/68 | HR 58 | Temp 98.6°F | Wt 207.0 lb

## 2015-09-21 DIAGNOSIS — Z30432 Encounter for removal of intrauterine contraceptive device: Secondary | ICD-10-CM

## 2015-09-21 DIAGNOSIS — N946 Dysmenorrhea, unspecified: Secondary | ICD-10-CM

## 2015-09-21 DIAGNOSIS — N939 Abnormal uterine and vaginal bleeding, unspecified: Secondary | ICD-10-CM

## 2015-09-21 DIAGNOSIS — Z30019 Encounter for initial prescription of contraceptives, unspecified: Secondary | ICD-10-CM

## 2015-09-21 MED ORDER — NORETHIN ACE-ETH ESTRAD-FE 1-20 MG-MCG(24) PO TABS: 1.0000 | ORAL_TABLET | Freq: Every day | ORAL | Status: DC

## 2015-09-21 NOTE — Progress Notes (Signed)
Patient ID: Nicole Mclean, female   DOB: May 23, 1996, 20 y.o.   MRN: [id number]  Chief Complaint  Patient presents with  . Gynecologic Exam    Patient reports bleeding with her IUD- she has been bleeding since she had it inserted.    HPI Nicole Mclean is a 20 y.o. female.  Irregular bleeding after IUD insertion.  Also severe cramping and migraines.  " IUD just doesn't feel right ".  Desires removal of IUD and wants to start OCP's. HPI  Past Medical History  Diagnosis Date  . Gonorrhea April 2015    Past Surgical History  Procedure Laterality Date  . Rhytidectomy neck / cheek / chin    . Hand surgery      Left  hand  . Hand surgery      left   . Chin surgery      Family History  Problem Relation Age of Onset  . [place] Mother   . Miscarriages / Stillbirths Sister   . Miscarriages / Stillbirths Maternal Grandmother   . Alcohol abuse Neg Hx   . Arthritis Neg Hx   . Asthma Neg Hx   . COPD Neg Hx   . Cancer Neg Hx   . Birth defects Neg Hx   . Depression Neg Hx   . Diabetes Neg Hx   . Drug abuse Neg Hx   . Early death Neg Hx   . Hearing loss Neg Hx   . Heart disease Neg Hx   . Hyperlipidemia Neg Hx   . Hypertension Neg Hx   . Kidney disease Neg Hx   . Learning disabilities Neg Hx   . Mental illness Neg Hx   . Mental retardation Neg Hx   . Stroke Neg Hx   . Vision loss Neg Hx   . Varicose Veins Neg Hx   . Migraines Neg Hx     Social History Social History  Substance Use Topics  . Smoking status: Former Smoker    Types: Cigars    Quit date: 08/04/2013  . Smokeless tobacco: Never Used  . Alcohol Use: No    No Known Allergies  Current Outpatient Prescriptions  Medication Sig Dispense Refill  . levonorgestrel (MIRENA) 20 MCG/24HR IUD 1 each by Intrauterine route once.    . doxycycline (VIBRAMYCIN) 100 MG capsule Take 1 capsule (100 mg total) by mouth 2 (two) times daily. (Patient not taking: Reported on 09/21/2015) 28 capsule 1  . ibuprofen  (ADVIL,MOTRIN) 800 MG tablet Take 1 tablet (800 mg total) by mouth every 8 (eight) hours as needed for mild pain or moderate pain. (Patient not taking: Reported on 09/21/2015) 30 tablet 5  . metroNIDAZOLE (FLAGYL) 500 MG tablet Take 1 tablet (500 mg total) by mouth 2 (two) times daily. (Patient not taking: Reported on 09/21/2015) 28 tablet 1   No current facility-administered medications for this visit.    Review of Systems Review of Systems Constitutional: negative for fatigue and weight loss Respiratory: negative for cough and wheezing Cardiovascular: negative for chest pain, fatigue and palpitations Gastrointestinal: negative for abdominal pain and change in bowel habits Genitourinary: Irregular vaginal bleeding Integument/breast: negative for nipple discharge Musculoskeletal:negative for myalgias Neurological: negative for gait problems and tremors Behavioral/Psych: negative for abusive relationship, depression Endocrine: negative for temperature intolerance     Blood pressure 108/68, pulse 58, temperature 98.6 F (37 C), weight 207 lb (93.895 kg), last menstrual period 08/13/2015, not currently breastfeeding.  Physical Exam Physical Exam  General:  Alert and no distress Abdomen:  normal findings: no organomegaly, soft, non-tender and no hernia  Pelvis:  External genitalia: normal general appearance Urinary system: urethral meatus normal and bladder without fullness, nontender Vaginal: normal without tenderness, induration or masses Cervix: normal appearance.  IUD string grasped with long dressing forceps and removed, intact. Adnexa: normal bimanual exam Uterus: anteverted and non-tender, normal size      Data Reviewed Labs  Assessment     AUB with IUD.  Desire for IUD removal. Contraceptive counseling and advice     Plan    IUD Removal Loestrin 24 Fe Rx F/U 4 months   No orders of the defined types were placed in this encounter.   Meds ordered this  encounter  Medications  . levonorgestrel (MIRENA) 20 MCG/24HR IUD    Sig: 1 each by Intrauterine route once.

## 2015-09-22 ENCOUNTER — Encounter: Payer: Self-pay | Admitting: Obstetrics

## 2015-10-23 ENCOUNTER — Encounter: Payer: Self-pay | Admitting: Certified Nurse Midwife

## 2015-10-23 ENCOUNTER — Ambulatory Visit (INDEPENDENT_AMBULATORY_CARE_PROVIDER_SITE_OTHER): Payer: Medicaid Other | Admitting: Certified Nurse Midwife

## 2015-10-23 VITALS — BP 123/78 | HR 80 | Ht 60.0 in | Wt 197.0 lb

## 2015-10-23 DIAGNOSIS — N939 Abnormal uterine and vaginal bleeding, unspecified: Secondary | ICD-10-CM

## 2015-10-23 DIAGNOSIS — Z3202 Encounter for pregnancy test, result negative: Secondary | ICD-10-CM | POA: Diagnosis not present

## 2015-10-23 DIAGNOSIS — Z30018 Encounter for initial prescription of other contraceptives: Secondary | ICD-10-CM

## 2015-10-23 LAB — POCT URINE PREGNANCY: PREG TEST UR: NEGATIVE

## 2015-10-23 MED ORDER — ETONOGESTREL-ETHINYL ESTRADIOL 0.12-0.015 MG/24HR VA RING: VAGINAL_RING | VAGINAL | Status: DC

## 2015-10-23 NOTE — Progress Notes (Signed)
Patient ID: Nicole Mclean, female   DOB: Mar 29, 1996, 20 y.o.   MRN: [id number]  Chief Complaint  Patient presents with  . Gynecologic Exam    Patient states she has had side effects with the OCP since she changed her birth control from the [place] . Patient is having BTB, nausea, headache - she did get pregnany before on OCP.    HPI Nicole Mclean is a 20 y.o. female.  Here d/t AUB with OCPs.  Is having N&V, virtigo with OCPs and desires to change methods.  States she is at the end of her pill pack.  Had period around the end of March, 25th ish.  Was a normal period but has been having irregular bleeding since.  Desires pregnancy test.  Discussed Nuva Ring vaginal inserts in detail on use, and to use back up birth control the first month.  Encouraged to start with it today since she has not been taking her pill regularly.  Discussed that it may also affect her period.     HPI  Past Medical History  Diagnosis Date  . Gonorrhea April 2015    Past Surgical History  Procedure Laterality Date  . Rhytidectomy neck / cheek / chin    . Hand surgery      Left  hand  . Hand surgery      left   . Chin surgery      Family History  Problem Relation Age of Onset  . Miscarriages / [place] Mother   . Miscarriages / Stillbirths Sister   . Miscarriages / Stillbirths Maternal Grandmother   . Alcohol abuse Neg Hx   . Arthritis Neg Hx   . Asthma Neg Hx   . COPD Neg Hx   . Cancer Neg Hx   . Birth defects Neg Hx   . Depression Neg Hx   . Diabetes Neg Hx   . Drug abuse Neg Hx   . Early death Neg Hx   . Hearing loss Neg Hx   . Heart disease Neg Hx   . Hyperlipidemia Neg Hx   . Hypertension Neg Hx   . Kidney disease Neg Hx   . Learning disabilities Neg Hx   . Mental illness Neg Hx   . Mental retardation Neg Hx   . Stroke Neg Hx   . Vision loss Neg Hx   . Varicose Veins Neg Hx   . Migraines Neg Hx     Social History Social History  Substance Use Topics  . Smoking status: Former  Smoker    Types: Cigars    Quit date: 08/04/2013  . Smokeless tobacco: Never Used  . Alcohol Use: No    No Known Allergies  Current Outpatient Prescriptions  Medication Sig Dispense Refill  . Norethindrone Acetate-Ethinyl Estrad-FE (LOESTRIN 24 FE) 1-20 MG-MCG(24) tablet Take 1 tablet by mouth daily. 1 Package 11  . etonogestrel-ethinyl estradiol (NUVARING) 0.12-0.015 MG/24HR vaginal ring Insert vaginally and leave in place for 3 consecutive weeks, then remove for 1 week. 1 each 12   No current facility-administered medications for this visit.    Review of Systems Review of Systems Constitutional: negative for fatigue and weight loss Respiratory: negative for cough and wheezing Cardiovascular: negative for chest pain, fatigue and palpitations Gastrointestinal: negative for abdominal pain and change in bowel habits Genitourinary:negative Integument/breast: negative for nipple discharge Musculoskeletal:negative for myalgias Neurological: negative for gait problems and tremors Behavioral/Psych: negative for abusive relationship, depression Endocrine: negative for temperature intolerance  Blood pressure 123/78, pulse 80, height 5' (1.524 m), weight 197 lb (89.359 kg), last menstrual period 10/05/2015, not currently breastfeeding.  Physical Exam Physical Exam Deferred   100% of 15 min visit spent on counseling and coordination of care.   Data Reviewed Previous medical hx, meds  Assessment     Side effects from OCPs Contraception management     Plan    Orders Placed This Encounter  Procedures  . POCT urine pregnancy   Meds ordered this encounter  Medications  . etonogestrel-ethinyl estradiol (NUVARING) 0.12-0.015 MG/24HR vaginal ring    Sig: Insert vaginally and leave in place for 3 consecutive weeks, then remove for 1 week.    Dispense:  1 each    Refill:  12   Possible management options include: another form of birth control Follow up in 3 months.

## 2015-10-30 ENCOUNTER — Inpatient Hospital Stay (HOSPITAL_COMMUNITY)
Admission: AD | Admit: 2015-10-30 | Discharge: 2015-10-30 | Disposition: A | Discharge status: Home / Self Care | Payer: Medicaid Other | Source: Ambulatory Visit | Attending: Obstetrics | Admitting: Obstetrics

## 2015-10-30 ENCOUNTER — Encounter (HOSPITAL_COMMUNITY): Payer: Self-pay | Admitting: *Deleted

## 2015-10-30 DIAGNOSIS — Z87891 Personal history of nicotine dependence: Secondary | ICD-10-CM | POA: Diagnosis not present

## 2015-10-30 DIAGNOSIS — R112 Nausea with vomiting, unspecified: Secondary | ICD-10-CM

## 2015-10-30 DIAGNOSIS — Z3202 Encounter for pregnancy test, result negative: Secondary | ICD-10-CM | POA: Diagnosis not present

## 2015-10-30 DIAGNOSIS — B349 Viral infection, unspecified: Secondary | ICD-10-CM | POA: Diagnosis not present

## 2015-10-30 LAB — URINALYSIS, ROUTINE W REFLEX MICROSCOPIC
Bilirubin Urine: NEGATIVE
GLUCOSE, UA: NEGATIVE mg/dL
HGB URINE DIPSTICK: NEGATIVE
Ketones, ur: NEGATIVE mg/dL
Leukocytes, UA: NEGATIVE
Nitrite: NEGATIVE
PROTEIN: NEGATIVE mg/dL
SPECIFIC GRAVITY, URINE: 1.02 (ref 1.005–1.030)
pH: 6.5 (ref 5.0–8.0)

## 2015-10-30 LAB — HCG, QUANTITATIVE, PREGNANCY

## 2015-10-30 LAB — CBC WITH DIFFERENTIAL/PLATELET
BASOS PCT: 0 %
Basophils Absolute: 0 10*3/uL (ref 0.0–0.1)
EOS ABS: 0.1 10*3/uL (ref 0.0–0.7)
Eosinophils Relative: 1 %
HCT: 33.3 % — ABNORMAL LOW (ref 36.0–46.0)
HEMOGLOBIN: 11.2 g/dL — AB (ref 12.0–15.0)
Lymphocytes Relative: 38 %
Lymphs Abs: 3.1 10*3/uL (ref 0.7–4.0)
MCH: 28.7 pg (ref 26.0–34.0)
MCHC: 33.6 g/dL (ref 30.0–36.0)
MCV: 85.4 fL (ref 78.0–100.0)
Monocytes Absolute: 0.2 10*3/uL (ref 0.1–1.0)
Monocytes Relative: 3 %
NEUTROS PCT: 58 %
Neutro Abs: 4.8 10*3/uL (ref 1.7–7.7)
Platelets: 316 10*3/uL (ref 150–400)
RBC: 3.9 MIL/uL (ref 3.87–5.11)
RDW: 13.3 % (ref 11.5–15.5)
WBC: 8.2 10*3/uL (ref 4.0–10.5)

## 2015-10-30 LAB — WET PREP, GENITAL
CLUE CELLS WET PREP: NONE SEEN
Sperm: NONE SEEN
Trich, Wet Prep: NONE SEEN
Yeast Wet Prep HPF POC: NONE SEEN

## 2015-10-30 LAB — POCT PREGNANCY, URINE: Preg Test, Ur: NEGATIVE

## 2015-10-30 NOTE — MAU Provider Note (Signed)
History     CSN: [id number]  Arrival date and time: 10/30/15 1315   None     Chief Complaint  Patient presents with  . Nausea   HPI Nicole Mclean is 20 y.o. [id number] presents for nausea and vomiting, unable to keep anything down X 2 weeks.  Vomits 2-3 X day.  +HPT today.  LMP 10/05/15 -dark blood.  Has a 41 month old and periods are different.  Is using Nuvaring for contraception, removed today after + HPT.  She is a patient of Dr. Thomes Lolling, she did not call office.  She wants STI screening. Denies vaginal bleeding or vaginal discharge.  Boyfriend "stepped out".  Had cramping 2 days ago.  Past Medical History  Diagnosis Date  . Gonorrhea April 2015    Past Surgical History  Procedure Laterality Date  . Rhytidectomy neck / cheek / chin    . Hand surgery      Left  hand  . Hand surgery      left   . Chin surgery      Family History  Problem Relation Age of Onset  . Miscarriages / [place] Mother   . Miscarriages / Stillbirths Sister   . Miscarriages / Stillbirths Maternal Grandmother   . Alcohol abuse Neg Hx   . Arthritis Neg Hx   . Asthma Neg Hx   . COPD Neg Hx   . Cancer Neg Hx   . Birth defects Neg Hx   . Depression Neg Hx   . Diabetes Neg Hx   . Drug abuse Neg Hx   . Early death Neg Hx   . Hearing loss Neg Hx   . Heart disease Neg Hx   . Hyperlipidemia Neg Hx   . Hypertension Neg Hx   . Kidney disease Neg Hx   . Learning disabilities Neg Hx   . Mental illness Neg Hx   . Mental retardation Neg Hx   . Stroke Neg Hx   . Vision loss Neg Hx   . Varicose Veins Neg Hx   . Migraines Neg Hx     Social History  Substance Use Topics  . Smoking status: Former Smoker    Types: Cigars    Quit date: 08/04/2013  . Smokeless tobacco: Never Used  . Alcohol Use: No    Allergies: No Known Allergies  Prescriptions prior to admission  Medication Sig Dispense Refill Last Dose  . etonogestrel-ethinyl estradiol (NUVARING) 0.12-0.015 MG/24HR vaginal ring Insert  vaginally and leave in place for 3 consecutive weeks, then remove for 1 week. 1 each 12 Past Week at Unknown time    Review of Systems  Constitutional: Positive for malaise/fatigue. Negative for fever and chills.  Gastrointestinal: Negative for nausea and vomiting.  Genitourinary: Negative for dysuria, urgency, frequency, hematuria and flank pain.       Neg for vaginal discharge/bleeding.  Neurological: Negative for headaches.   Physical Exam   Blood pressure 125/78, pulse 69, temperature 99.1 F (37.3 C), temperature source Oral, resp. rate 18, height 5' (1.524 m), weight 199 lb 3.2 oz (90.357 kg), last menstrual period 10/05/2015, not currently breastfeeding.  Physical Exam  Nursing note and vitals reviewed. Constitutional: She is oriented to person, place, and time. She appears well-developed and well-nourished. No distress.  HENT:  Head: Normocephalic.  Neck: Normal range of motion.  Cardiovascular: Normal rate.   Respiratory: Effort normal.  GI: Soft. She exhibits no distension and no mass. There is no tenderness. There is no rebound  and no guarding.  Genitourinary: There is no rash, tenderness or lesion on the right labia. There is no rash, tenderness or lesion on the left labia. Uterus is not enlarged and not tender. Cervix exhibits no motion tenderness, no discharge and no friability. Right adnexum displays no mass, no tenderness and no fullness. Left adnexum displays no mass, no tenderness and no fullness. No erythema, tenderness or bleeding in the vagina. No vaginal discharge (scant white discharge without odor) found.  Neurological: She is alert and oriented to person, place, and time.  Skin: Skin is warm and dry.  Psychiatric: She has a normal mood and affect. Her behavior is normal. Thought content normal.    Results for orders placed or performed during the hospital encounter of 10/30/15 (from the past 24 hour(s))  Urinalysis, Routine w reflex microscopic (not at [facility])      Status: None   Collection Time: 10/30/15  1:30 PM  Result Value Ref Range   Color, Urine YELLOW YELLOW   APPearance CLEAR CLEAR   Specific Gravity, Urine 1.020 1.005 - 1.030   pH 6.5 5.0 - 8.0   Glucose, UA NEGATIVE NEGATIVE mg/dL   Hgb urine dipstick NEGATIVE NEGATIVE   Bilirubin Urine NEGATIVE NEGATIVE   Ketones, ur NEGATIVE NEGATIVE mg/dL   Protein, ur NEGATIVE NEGATIVE mg/dL   Nitrite NEGATIVE NEGATIVE   Leukocytes, UA NEGATIVE NEGATIVE  Pregnancy, urine POC     Status: None   Collection Time: 10/30/15  1:34 PM  Result Value Ref Range   Preg Test, Ur NEGATIVE NEGATIVE  Wet prep, genital     Status: Abnormal   Collection Time: 10/30/15  2:03 PM  Result Value Ref Range   Yeast Wet Prep HPF POC NONE SEEN NONE SEEN   Trich, Wet Prep NONE SEEN NONE SEEN   Clue Cells Wet Prep HPF POC NONE SEEN NONE SEEN   WBC, Wet Prep HPF POC MANY (A) NONE SEEN   Sperm NONE SEEN   CBC with Differential/Platelet     Status: Abnormal   Collection Time: 10/30/15  2:06 PM  Result Value Ref Range   WBC 8.2 4.0 - 10.5 K/uL   RBC 3.90 3.87 - 5.11 MIL/uL   Hemoglobin 11.2 (L) 12.0 - 15.0 g/dL   HCT 16.1 (L) 09.6 - 04.5 %   MCV 85.4 78.0 - 100.0 fL   MCH 28.7 26.0 - 34.0 pg   MCHC 33.6 30.0 - 36.0 g/dL   RDW 40.9 81.1 - 91.4 %   Platelets 316 150 - 400 K/uL   Neutrophils Relative % 58 %   Neutro Abs 4.8 1.7 - 7.7 K/uL   Lymphocytes Relative 38 %   Lymphs Abs 3.1 0.7 - 4.0 K/uL   Monocytes Relative 3 %   Monocytes Absolute 0.2 0.1 - 1.0 K/uL   Eosinophils Relative 1 %   Eosinophils Absolute 0.1 0.0 - 0.7 K/uL   Basophils Relative 0 %   Basophils Absolute 0.0 0.0 - 0.1 K/uL  hCG, quantitative, pregnancy     Status: None   Collection Time: 10/30/15  2:06 PM  Result Value Ref Range   hCG, Beta Chain, Quant, S <1 <5 mIU/mL   MAU Course  Procedures  GC/CHL and HIV pending  MDM MSE Exam Labs  Assessment and Plan  A:  Nausea and vomiting      Possible viral illness      Negative UPT and   BHCG  P:  Encouraged patient to eat small amount of bland food      Stay well hydrated      If sxs worsen, call PCP for appointment      Count today as day 1 off nuvaring and insert a new ring in 6 days.        KEY,EVE M 10/30/2015, 3:15 PM

## 2015-10-30 NOTE — MAU Note (Addendum)
Had positive pregnancy test today, wants testing for STDs, nausea x 2 weeks, fatigue, no vaginal discharge, spotting 3 weeks ago for 2 to 3 days, LMP about a week before.

## 2015-10-31 LAB — HIV ANTIBODY (ROUTINE TESTING W REFLEX): HIV Screen 4th Generation wRfx: NONREACTIVE

## 2015-11-02 ENCOUNTER — Encounter (HOSPITAL_COMMUNITY): Payer: Self-pay | Admitting: Emergency Medicine

## 2015-11-02 ENCOUNTER — Ambulatory Visit (HOSPITAL_COMMUNITY)
Admission: EM | Admit: 2015-11-02 | Discharge: 2015-11-02 | Disposition: A | Discharge status: Home / Self Care | Payer: Medicaid Other | Attending: Family Medicine | Admitting: Family Medicine

## 2015-11-02 DIAGNOSIS — R531 Weakness: Secondary | ICD-10-CM

## 2015-11-02 DIAGNOSIS — R112 Nausea with vomiting, unspecified: Secondary | ICD-10-CM | POA: Diagnosis not present

## 2015-11-02 LAB — POCT I-STAT, CHEM 8
BUN: 8 mg/dL (ref 6–20)
CALCIUM ION: 1.2 mmol/L (ref 1.12–1.23)
CREATININE: 0.7 mg/dL (ref 0.44–1.00)
Chloride: 102 mmol/L (ref 101–111)
GLUCOSE: 85 mg/dL (ref 65–99)
HEMATOCRIT: 39 % (ref 36.0–46.0)
HEMOGLOBIN: 13.3 g/dL (ref 12.0–15.0)
Potassium: 3.6 mmol/L (ref 3.5–5.1)
Sodium: 140 mmol/L (ref 135–145)
TCO2: 24 mmol/L (ref 0–100)

## 2015-11-02 LAB — POCT URINALYSIS DIP (DEVICE)
Bilirubin Urine: NEGATIVE
Glucose, UA: NEGATIVE mg/dL
HGB URINE DIPSTICK: NEGATIVE
Ketones, ur: NEGATIVE mg/dL
LEUKOCYTES UA: NEGATIVE
NITRITE: NEGATIVE
PROTEIN: NEGATIVE mg/dL
Specific Gravity, Urine: 1.02 (ref 1.005–1.030)
UROBILINOGEN UA: 0.2 mg/dL (ref 0.0–1.0)
pH: 6 (ref 5.0–8.0)

## 2015-11-02 LAB — POCT PREGNANCY, URINE: Preg Test, Ur: NEGATIVE

## 2015-11-02 LAB — GC/CHLAMYDIA PROBE AMP (~~LOC~~) NOT AT ARMC
CHLAMYDIA, DNA PROBE: NEGATIVE
NEISSERIA GONORRHEA: NEGATIVE

## 2015-11-02 MED ORDER — ONDANSETRON HCL 4 MG PO TABS: 4.0000 mg | ORAL_TABLET | Freq: Three times a day (TID) | ORAL | Status: DC | PRN

## 2015-11-02 NOTE — Discharge Instructions (Signed)
Nausea and Vomiting °Nausea is a sick feeling that often comes before throwing up (vomiting). Vomiting is a reflex where stomach contents come out of your mouth. Vomiting can cause severe loss of body fluids (dehydration). Children and elderly adults can become dehydrated quickly, especially if they also have diarrhea. Nausea and vomiting are symptoms of a condition or disease. It is important to find the cause of your symptoms. °CAUSES  °· Direct irritation of the stomach lining. This irritation can result from increased acid production (gastroesophageal reflux disease), infection, food poisoning, taking certain medicines (such as nonsteroidal anti-inflammatory drugs), alcohol use, or tobacco use. °· Signals from the brain. These signals could be caused by a headache, heat exposure, an inner ear disturbance, increased pressure in the brain from injury, infection, a tumor, or a concussion, pain, emotional stimulus, or metabolic problems. °· An obstruction in the gastrointestinal tract (bowel obstruction). °· Illnesses such as diabetes, hepatitis, gallbladder problems, appendicitis, kidney problems, cancer, sepsis, atypical symptoms of a heart attack, or eating disorders. °· Medical treatments such as chemotherapy and radiation. °· Receiving medicine that makes you sleep (general anesthetic) during surgery. °DIAGNOSIS °Your caregiver may ask for tests to be done if the problems do not improve after a few days. Tests may also be done if symptoms are severe or if the reason for the nausea and vomiting is not clear. Tests may include: °· Urine tests. °· Blood tests. °· Stool tests. °· Cultures (to look for evidence of infection). °· X-rays or other imaging studies. °Test results can help your caregiver make decisions about treatment or the need for additional tests. °TREATMENT °You need to stay well hydrated. Drink frequently but in small amounts. You may wish to drink water, sports drinks, clear broth, or eat frozen  ice pops or gelatin dessert to help stay hydrated. When you eat, eating slowly may help prevent nausea. There are also some antinausea medicines that may help prevent nausea. °HOME CARE INSTRUCTIONS  °· Take all medicine as directed by your caregiver. °· If you do not have an appetite, do not force yourself to eat. However, you must continue to drink fluids. °· If you have an appetite, eat a normal diet unless your caregiver tells you differently. °· Eat a variety of complex carbohydrates (rice, wheat, potatoes, bread), lean meats, yogurt, fruits, and vegetables. °· Avoid high-fat foods because they are more difficult to digest. °· Drink enough water and fluids to keep your urine clear or pale yellow. °· If you are dehydrated, ask your caregiver for specific rehydration instructions. Signs of dehydration may include: °· Severe thirst. °· Dry lips and mouth. °· Dizziness. °· Dark urine. °· Decreasing urine frequency and amount. °· Confusion. °· Rapid breathing or pulse. °SEEK IMMEDIATE MEDICAL CARE IF:  °· You have blood or brown flecks (like coffee grounds) in your vomit. °· You have black or bloody stools. °· You have a severe headache or stiff neck. °· You are confused. °· You have severe abdominal pain. °· You have chest pain or trouble breathing. °· You do not urinate at least once every 8 hours. °· You develop cold or clammy skin. °· You continue to vomit for longer than 24 to 48 hours. °· You have a fever. °MAKE SURE YOU:  °· Understand these instructions. °· Will watch your condition. °· Will get help right away if you are not doing well or get worse. °  °This information is not intended to replace advice given to you by your health care provider. Make sure   you discuss any questions you have with your health care provider.   Document Released: 07/04/2005 Document Revised: 09/26/2011 Document Reviewed: 12/01/2010 Elsevier Interactive Patient Education 2016 [employer].  Fatigue Fatigue is feeling tired  all of the time, a lack of energy, or a lack of motivation. Occasional or mild fatigue is often a normal response to activity or life in general. However, long-lasting (chronic) or extreme fatigue may indicate an underlying medical condition. HOME CARE INSTRUCTIONS  Watch your fatigue for any changes. The following actions may help to lessen any discomfort you are feeling:  Talk to your health care provider about how much sleep you need each night. Try to get the required amount every night.  Take medicines only as directed by your health care provider.  Eat a healthy and nutritious diet. Ask your health care provider if you need help changing your diet.  Drink enough fluid to keep your urine clear or pale yellow.  Practice ways of relaxing, such as yoga, meditation, massage therapy, or acupuncture.  Exercise regularly.   Change situations that cause you stress. Try to keep your work and personal routine reasonable.  Do not abuse illegal drugs.  Limit alcohol intake to no more than 1 drink per day for nonpregnant women and 2 drinks per day for men. One drink equals 12 ounces of beer, 5 ounces of wine, or 1 ounces of hard liquor.  Take a multivitamin, if directed by your health care provider. SEEK MEDICAL CARE IF:   Your fatigue does not get better.  You have a fever.   You have unintentional weight loss or gain.  You have headaches.   You have difficulty:   Falling asleep.  Sleeping throughout the night.  You feel angry, guilty, anxious, or sad.   You are unable to have a bowel movement (constipation).   You skin is dry.   Your legs or another part of your body is swollen.  SEEK IMMEDIATE MEDICAL CARE IF:   You feel confused.   Your vision is blurry.  You feel faint or pass out.   You have a severe headache.   You have severe abdominal, pelvic, or back pain.   You have chest pain, shortness of breath, or an irregular or fast heartbeat.   You  are unable to urinate or you urinate less than normal.   You develop abnormal bleeding, such as bleeding from the rectum, vagina, nose, lungs, or nipples.  You vomit blood.   You have thoughts about harming yourself or committing suicide.   You are worried that you might harm someone else.    This information is not intended to replace advice given to you by your health care provider. Make sure you discuss any questions you have with your health care provider.   Document Released: 05/01/2007 Document Revised: 07/25/2014 Document Reviewed: 11/05/2013 Elsevier Interactive Patient Education 2016 Else[employer] Inc.  Weakness Weakness is a lack of strength. It may be felt all over the body (generalized) or in one specific part of the body (focal). Some causes of weakness can be serious. You may need further medical evaluation, especially if you are elderly or you have a history of immunosuppression (such as chemotherapy or HIV), kidney disease, heart disease, or diabetes. CAUSES  Weakness can be caused by many different things, including:  Infection.  Physical exhaustion.  Internal bleeding or other blood loss that results in a lack of red blood cells (anemia).  Dehydration. This cause is more common in elderly  people.  Side effects or electrolyte abnormalities from medicines, such as pain medicines or sedatives.  Emotional distress, anxiety, or depression.  Circulation problems, especially severe peripheral arterial disease.  Heart disease, such as rapid atrial fibrillation, bradycardia, or heart failure.  Nervous system disorders, such as Guillain-Barr syndrome, multiple sclerosis, or stroke. DIAGNOSIS  To find the cause of your weakness, your caregiver will take your history and perform a physical exam. Lab tests or X-rays may also be ordered, if needed. TREATMENT  Treatment of weakness depends on the cause of your symptoms and can vary greatly. HOME CARE INSTRUCTIONS    Rest as needed.  Eat a well-balanced diet.  Try to get some exercise every day.  Only take over-the-counter or prescription medicines as directed by your caregiver. SEEK MEDICAL CARE IF:   Your weakness seems to be getting worse or spreads to other parts of your body.  You develop new aches or pains. SEEK IMMEDIATE MEDICAL CARE IF:   You cannot perform your normal daily activities, such as getting dressed and feeding yourself.  You cannot walk up and down stairs, or you feel exhausted when you do so.  You have shortness of breath or chest pain.  You have difficulty moving parts of your body.  You have weakness in only one area of the body or on only one side of the body.  You have a fever.  You have trouble speaking or swallowing.  You cannot control your bladder or bowel movements.  You have black or bloody vomit or stools. MAKE SURE YOU:  Understand these instructions.  Will watch your condition.  Will get help right away if you are not doing well or get worse.   This information is not intended to replace advice given to you by your health care provider. Make sure you discuss any questions you have with your health care provider.   Document Released: 07/04/2005 Document Revised: 01/03/2012 Document Reviewed: 09/02/2011 Elsevier Interactive Patient Education [employer] Elsevier Inc.

## 2015-11-02 NOTE — ED Provider Notes (Signed)
CSN: [id number]     Arrival date & time 11/02/15  1303 History   First MD Initiated Contact with Patient 11/02/15 1431     Chief Complaint  Patient presents with  . Nausea  . Fatigue   (Consider location/radiation/quality/duration/timing/severity/associated sxs/prior Treatment) HPI Comments: 20 year old female presents to the urgent care with complaints of nausea and vomiting for the past 2 weeks. She states she has not felt sick otherwise. Her only other complaint is that of occasional dizziness. She has nausea nearly every day and vomits 1-3 times a day. She is able to drink water and Gatorade without vomiting. He is also complaining of fatigue. She later reports having lower abdominal pain/cramping.States she delivered a viable female 4 months ago.no current vaginal bleeding or discharge. She was seen by a gynecologist/NP 3 days ago for identical symptoms.She was apparently having problems with her nuvaring and was advised to remove it and replace it in 6 days.  Pelvic exam was performed and testing for STDs were negative. She states that if she was not feeling better to go to an urgent care.She states she is not having any new symptoms but continuation of the same symptoms from the past 2 weeks.   Past Medical History  Diagnosis Date  . Gonorrhea April 2015   Past Surgical History  Procedure Laterality Date  . Rhytidectomy neck / cheek / chin    . Hand surgery      Left  hand  . Hand surgery      left   . Chin surgery     Family History  Problem Relation Age of Onset  . Miscarriages / [place] Mother   . Miscarriages / Stillbirths Sister   . Miscarriages / Stillbirths Maternal Grandmother   . Alcohol abuse Neg Hx   . Arthritis Neg Hx   . Asthma Neg Hx   . COPD Neg Hx   . Cancer Neg Hx   . Birth defects Neg Hx   . Depression Neg Hx   . Diabetes Neg Hx   . Drug abuse Neg Hx   . Early death Neg Hx   . Hearing loss Neg Hx   . Heart disease Neg Hx   . Hyperlipidemia Neg Hx    . Hypertension Neg Hx   . Kidney disease Neg Hx   . Learning disabilities Neg Hx   . Mental illness Neg Hx   . Mental retardation Neg Hx   . Stroke Neg Hx   . Vision loss Neg Hx   . Varicose Veins Neg Hx   . Migraines Neg Hx    Social History  Substance Use Topics  . Smoking status: Former Smoker    Types: Cigars    Quit date: 08/04/2013  . Smokeless tobacco: Never Used  . Alcohol Use: No   OB History    Gravida Para Term Preterm AB TAB SAB Ectopic Multiple Living   0 1 0 1 0 0 2     Review of Systems  Constitutional: Positive for activity change and fatigue. Negative for fever and chills.  HENT: Negative.   Eyes: Negative for visual disturbance.  Respiratory: Negative.   Cardiovascular: Negative for chest pain and leg swelling.  Gastrointestinal: Positive for nausea, vomiting and abdominal pain. Negative for diarrhea.  Genitourinary: Negative.   Musculoskeletal: Negative for myalgias, arthralgias, neck pain and neck stiffness.  Skin: Negative.   Neurological: Positive for dizziness. Negative for tremors, syncope, facial asymmetry, speech difficulty and headaches.  Psychiatric/Behavioral: Negative.     Allergies  Review of patient's allergies indicates no known allergies.  Home Medications   Prior to Admission medications   Medication Sig Start Date End Date Taking? Authorizing Provider  etonogestrel-ethinyl estradiol (NUVARING) 0.12-0.015 MG/24HR vaginal ring Insert vaginally and leave in place for 3 consecutive weeks, then remove for 1 week. 10/23/15   Rachelle A Denney, CNM  ondansetron (ZOFRAN) 4 MG tablet Take 1 tablet (4 mg total) by mouth every 8 (eight) hours as needed for nausea. May cause constipation 11/02/15   Hayden Rasmussen, NP   Meds Ordered and Administered this Visit  Medications - No data to display  BP 115/59 mmHg  Pulse 60  Temp(Src) 98.6 F (37 C) (Oral)  Resp 16  SpO2 100%  LMP 10/05/2015 (Exact Date) No data found.   Physical Exam   Constitutional: She is oriented to person, place, and time. She appears well-developed and well-nourished. No distress.  HENT:  Head: Normocephalic and atraumatic.  Mouth/Throat: Oropharynx is clear and moist. No oropharyngeal exudate.  Bilateral TMs are normal.  Eyes: Conjunctivae and EOM are normal. Pupils are equal, round, and reactive to light. Right eye exhibits no discharge. Left eye exhibits no discharge.  Neck: Normal range of motion. Neck supple.  Cardiovascular: Normal rate and normal heart sounds.   Pulmonary/Chest: Effort normal and breath sounds normal. No respiratory distress.  Abdominal: Soft. Bowel sounds are normal. She exhibits no mass. There is no tenderness. There is no rebound and no guarding.  Minor tenderness to the lower mid abdomen.  Genitourinary:  A pelvic exam was performed 3 days ago. Testing for STD was negative. Pelvic exam deferred today.  Musculoskeletal: Normal range of motion. She exhibits no edema.  Neurological: She is alert and oriented to person, place, and time. No cranial nerve deficit. She exhibits normal muscle tone. Coordination normal.  Skin: Skin is warm and dry.  Psychiatric: She has a normal mood and affect. Her behavior is normal.    ED Course  Procedures (including critical care time)  Labs Review Labs Reviewed  POCT URINALYSIS DIP (DEVICE)  POCT PREGNANCY, URINE  POCT I-STAT, CHEM 8   Results for orders placed or performed during the hospital encounter of 11/02/15  POCT urinalysis dip (device)  Result Value Ref Range   Glucose, UA NEGATIVE NEGATIVE mg/dL   Bilirubin Urine NEGATIVE NEGATIVE   Ketones, ur NEGATIVE NEGATIVE mg/dL   Specific Gravity, Urine 1.020 1.005 - 1.030   Hgb urine dipstick NEGATIVE NEGATIVE   pH 6.0 5.0 - 8.0   Protein, ur NEGATIVE NEGATIVE mg/dL   Urobilinogen, UA 0.2 0.0 - 1.0 mg/dL   Nitrite NEGATIVE NEGATIVE   Leukocytes, UA NEGATIVE NEGATIVE  Pregnancy, urine POC  Result Value Ref Range   Preg  Test, Ur NEGATIVE NEGATIVE  I-STAT, chem 8  Result Value Ref Range   Sodium 140 135 - 145 mmol/L   Potassium 3.6 3.5 - 5.1 mmol/L   Chloride 102 101 - 111 mmol/L   BUN 8 6 - 20 mg/dL   Creatinine, Ser 1.61 0.44 - 1.00 mg/dL   Glucose, Bld 85 65 - 99 mg/dL   Calcium, Ion 0.96 1.12 - 1.23 mmol/L   TCO2 24 0 - 100 mmol/L   Hemoglobin 13.3 12.0 - 15.0 g/dL   HCT 04.5 40.9 - 81.1 %    Imaging Review No results found.   Visual Acuity Review  Right Eye Distance:   Left Eye Distance:   Bilateral Distance:  Right Eye Near:   Left Eye Near:    Bilateral Near:         MDM   1. Non-intractable vomiting with nausea, vomiting of unspecified type   2. Weakness     U/A nl UPT neg I-STAT nl. Physical exam remarkable for mild mid lower abd tenderness only.     SEEK MEDICAL CARE IF:   Your fatigue does not get better.  You have a fever.   You have unintentional weight loss or gain.  You have headaches.   You have difficulty:   Falling asleep.  Sleeping throughout the night.  You feel angry, guilty, anxious, or sad.   You are unable to have a bowel movement (constipation).   You skin is dry.   Your legs or another part of your body is swollen.   You cannot perform your normal daily activities, such as getting dressed and feeding yourself.  You cannot walk up and down stairs, or you feel exhausted when you do so.  You have shortness of breath or chest pain.  You have difficulty moving parts of your body.  You have weakness in only one area of the body or on only one side of the body.  You have a fever.  You have trouble speaking or swallowing.  You cannot control your bladder or bowel movements.  You have black or bloody vomit or stools.   SEEK IMMEDIATE MEDICAL CARE IF:   You feel confused.   Your vision is blurry.  You feel faint or pass out.   You have a severe headache.   You have severe abdominal, pelvic, or back pain.    You have chest pain, shortness of breath, or an irregular or fast heartbeat.   You are unable to urinate or you urinate less than normal.   You develop abnormal bleeding, such as bleeding from the rectum, vagina, nose, lungs, or nipples.  You vomit blood.   You have thoughts about harming yourself or committing suicide.   You are worried that you might harm someone else.    This information is not intended to replace advice given to you by your health care provider. Make sure you discuss any questions you have with your health care provider.   Meds ordered this encounter  Medications  . ondansetron (ZOFRAN) 4 MG tablet    Sig: Take 1 tablet (4 mg total) by mouth every 8 (eight) hours as needed for nausea. May cause constipation    Dispense:  20 tablet    Refill:  0    Order Specific Question:  Supervising Provider    Answer:  Linna HoffKINDL, JAMES D [[id number]]     Hayden Rasmussenavid Rakim Moone, NP 11/02/15 1526

## 2015-11-02 NOTE — ED Notes (Signed)
Nausea, fatigue, dizziness for 2 weeks.

## 2015-11-13 ENCOUNTER — Encounter (HOSPITAL_COMMUNITY): Payer: Self-pay

## 2015-11-13 ENCOUNTER — Emergency Department (HOSPITAL_COMMUNITY)
Admission: EM | Admit: 2015-11-13 | Discharge: 2015-11-13 | Disposition: A | Discharge status: Home / Self Care | Payer: Medicaid Other | Attending: Emergency Medicine | Admitting: Emergency Medicine

## 2015-11-13 DIAGNOSIS — Y998 Other external cause status: Secondary | ICD-10-CM | POA: Insufficient documentation

## 2015-11-13 DIAGNOSIS — Y9289 Other specified places as the place of occurrence of the external cause: Secondary | ICD-10-CM | POA: Insufficient documentation

## 2015-11-13 DIAGNOSIS — Y9389 Activity, other specified: Secondary | ICD-10-CM | POA: Insufficient documentation

## 2015-11-13 DIAGNOSIS — Z87891 Personal history of nicotine dependence: Secondary | ICD-10-CM | POA: Insufficient documentation

## 2015-11-13 DIAGNOSIS — W228XXA Striking against or struck by other objects, initial encounter: Secondary | ICD-10-CM | POA: Insufficient documentation

## 2015-11-13 DIAGNOSIS — Z8619 Personal history of other infectious and parasitic diseases: Secondary | ICD-10-CM | POA: Diagnosis not present

## 2015-11-13 DIAGNOSIS — S99921A Unspecified injury of right foot, initial encounter: Secondary | ICD-10-CM | POA: Diagnosis present

## 2015-11-13 DIAGNOSIS — S91201A Unspecified open wound of right great toe with damage to nail, initial encounter: Secondary | ICD-10-CM | POA: Diagnosis not present

## 2015-11-13 DIAGNOSIS — S91209A Unspecified open wound of unspecified toe(s) with damage to nail, initial encounter: Secondary | ICD-10-CM

## 2015-11-13 MED ORDER — LIDOCAINE HCL 2 % IJ SOLN: 10.0000 mL | Freq: Once | INTRAMUSCULAR | Status: DC

## 2015-11-13 MED ORDER — HYDROCODONE-ACETAMINOPHEN 5-325 MG PO TABS: ORAL_TABLET | ORAL | Status: DC

## 2015-11-13 MED ORDER — LIDOCAINE HCL (PF) 1 % IJ SOLN
INTRAMUSCULAR | Status: AC
  Filled 2015-11-13: qty 10

## 2015-11-13 NOTE — ED Notes (Signed)
Lidocaine administered by PA

## 2015-11-13 NOTE — ED Provider Notes (Signed)
CSN: [id number]     Arrival date & time 11/13/15  1038 History  By signing my name below, I, Ronney Lion, attest that this documentation has been prepared under the direction and in the presence of [employer], PA-C, . Electronically Signed: Ronney Lion, ED Scribe. 11/13/2015. 11:40 AM.    Chief Complaint  Patient presents with  . Toe Injury   The history is provided by the patient. No language interpreter was used.    HPI Comments: Nicole Mclean is a 20 y.o. female who presents to the Emergency Department complaining of sudden-onset, constant, gradually worsening right great toe pain with a partial nail avulsion after stubbing her toe while moving furniture this morning at 9 AM, about 3 hours ago. No associated factors were noted. Palpation and movement exacerbate her pain. She reports no prior treatments for her symptoms. Patient states she is not currently breastfeeding. She is unsure of the date of her last tetanus vaccination.    Past Medical History  Diagnosis Date  . Gonorrhea April 2015   Past Surgical History  Procedure Laterality Date  . Rhytidectomy neck / cheek / chin    . Hand surgery      Left  hand  . Hand surgery      left   . Chin surgery     Family History  Problem Relation Age of Onset  . Miscarriages / [place] Mother   . Miscarriages / Stillbirths Sister   . Miscarriages / Stillbirths Maternal Grandmother   . Alcohol abuse Neg Hx   . Arthritis Neg Hx   . Asthma Neg Hx   . COPD Neg Hx   . Cancer Neg Hx   . Birth defects Neg Hx   . Depression Neg Hx   . Diabetes Neg Hx   . Drug abuse Neg Hx   . Early death Neg Hx   . Hearing loss Neg Hx   . Heart disease Neg Hx   . Hyperlipidemia Neg Hx   . Hypertension Neg Hx   . Kidney disease Neg Hx   . Learning disabilities Neg Hx   . Mental illness Neg Hx   . Mental retardation Neg Hx   . Stroke Neg Hx   . Vision loss Neg Hx   . Varicose Veins Neg Hx   . Migraines Neg Hx    Social History  Substance  Use Topics  . Smoking status: Former Smoker    Types: Cigars    Quit date: 08/04/2013  . Smokeless tobacco: Never Used  . Alcohol Use: No   OB History    Gravida Para Term Preterm AB TAB SAB Ectopic Multiple Living   0 1 0 1 0 0 2     Review of Systems  A complete 10 system review of systems was obtained and all systems are negative except as noted in the HPI and PMH.    Allergies  Review of patient's allergies indicates no known allergies.  Home Medications   Prior to Admission medications   Medication Sig Start Date End Date Taking? Authorizing Provider  etonogestrel-ethinyl estradiol (NUVARING) 0.12-0.015 MG/24HR vaginal ring Insert vaginally and leave in place for 3 consecutive weeks, then remove for 1 week. 10/23/15   Rachelle A Denney, CNM  HYDROcodone-acetaminophen (NORCO/VICODIN) 5-325 MG tablet Take 1-2 tablets by mouth every 6 hours as needed for pain and/or cough. 11/13/15   Merion Grimaldo, PA-C  ondansetron (ZOFRAN) 4 MG tablet Take 1 tablet (4 mg total)  by mouth every 8 (eight) hours as needed for nausea. May cause constipation 11/02/15   Hayden Rasmussenavid Mabe, NP   BP 110/58 mmHg  Pulse 76  Temp(Src) 98.3 F (36.8 C) (Oral)  Resp 14  SpO2 99%  LMP 10/05/2015 (Exact Date) Physical Exam  Constitutional: She is oriented to person, place, and time. She appears well-developed and well-nourished. No distress.  HENT:  Head: Normocephalic and atraumatic.  Eyes: Conjunctivae and EOM are normal.  Neck: Neck supple. No tracheal deviation present.  Cardiovascular: Normal rate.   Pulmonary/Chest: Effort normal. No respiratory distress.  Musculoskeletal: Normal range of motion.  Right great toenail is actively bleeding and avulsed from the nail bed, except at the proximal medial aspect.   Neurological: She is alert and oriented to person, place, and time.  Skin: Skin is warm and dry.  Psychiatric: She has a normal mood and affect. Her behavior is normal.  Nursing note and  vitals reviewed.   ED Course  .Marland Kitchen.Laceration Repair Date/Time: 11/13/2015 5:05 PM Performed by: Wynetta EmeryPISCIOTTA, Geraldean Walen Authorized by: Wynetta EmeryPISCIOTTA, Mikhia Dusek Consent: Verbal consent obtained. Consent given by: patient Patient identity confirmed: verbally with patient Body area: lower extremity Anesthesia: digital block Local anesthetic: lidocaine 1% without epinephrine Anesthetic total: 5 ml Irrigation solution: saline Irrigation method: syringe Amount of cleaning: standard Debridement: none Skin closure: glue (3-0 Monocryl) Number of sutures: 2 Technique: simple Comments: Nail is tucked back into germinal matrix on the lateral aspect, the distal aspect of the nail is trephinated with Bovie to more easily passed suture material. The distal aspect of the nail is secured with 2 stitches using 3-0 Monocryl. Dermabond is applied to the proximal aspect of the nail bed and wound is cleaned and dressed in 2 x 2's and Coban.   (including critical care time)  DIAGNOSTIC STUDIES: Oxygen Saturation is 100% on RA, normal by my interpretation.    COORDINATION OF CARE: 11:40 AM - Discussed treatment plan with pt at bedside which includes applying local anesthetic to the nail and tacking nail back into position. Pt verbalized understanding and agreed to plan.   MDM   Final diagnoses:  Nail avulsion of toe, initial encounter    Filed Vitals:   11/13/15 1107 11/13/15 1306  BP: 108/60 110/58  Pulse: 88 76  Temp: 98.3 F (36.8 C)   TempSrc: Oral   Resp: 16 14  SpO2: 100% 99%    Nicole E Para MarchDuncan is 20 y.o. female presenting with partial nail avulsion, nail was tucked back into germinal matrix, secured with 2 sutures on the distal aspect and proximal aspect is secured with Dermabond. Counseled patient on wound care and return precautions. Patient given crutches and postop shoe for comfort.  Evaluation does not show pathology that would require ongoing emergent intervention or inpatient treatment. Pt is  hemodynamically stable and mentating appropriately. Discussed findings and plan with patient/guardian, who agrees with care plan. All questions answered. Return precautions discussed and outpatient follow up given.   Discharge Medication List as of 11/13/2015 12:57 PM    START taking these medications   Details  HYDROcodone-acetaminophen (NORCO/VICODIN) 5-325 MG tablet Take 1-2 tablets by mouth every 6 hours as needed for pain and/or cough., Print         I personally performed the services described in this documentation, which was scribed in my presence. The recorded information has been reviewed and is accurate.     Wynetta Emeryicole Enora Trillo, PA-C 11/13/15 1706  Azalia BilisKevin Campos, MD 11/14/15 [phone]

## 2015-11-13 NOTE — Discharge Instructions (Signed)
If you see signs of infection (warmth, redness, tenderness, pus, sharp increase in pain, fever, red streaking) immediately return to the emergency department.  For pain control please take ibuprofen (also known as Motrin or Advil) 800mg  (this is normally 4 over the counter pills) 3 times a day  for 5 days. Take with food to minimize stomach irritation.  Take vicodin for breakthrough pain, do not drink alcohol, drive, care for children or do other critical tasks while taking vicodin.  Do not hesitate to return to the emergency room for any new, worsening or concerning symptoms.  Please obtain primary care using resource guide below. Let them know that you were seen in the emergency room and that they will need to obtain records for further outpatient management.      Nail Avulsion Injury Nail avulsion means that you have lost the whole, or part of a nail. The nail will usually grow back in 2 to 6 months. If your injury damaged the growth center of the nail, the nail may be deformed, split, or not stuck to the nail bed. Sometimes the avulsed nail is stitched back in place. This provides temporary protection to the nail bed until the new nail grows in.  HOME CARE INSTRUCTIONS   Raise (elevate) your injury as much as possible.  Protect the injury and cover it with bandages (dressings) or splints as instructed.  Change dressings as instructed. SEEK MEDICAL CARE IF:   There is increasing pain, redness, or swelling.  You cannot move your fingers or toes.   This information is not intended to replace advice given to you by your health care provider. Make sure you discuss any questions you have with your health care provider.   Document Released: 08/11/2004 Document Revised: 09/26/2011 Document Reviewed: 06/05/2009 Elsevier Interactive Patient Education [employer]2016 Elsevier Inc.

## 2015-11-13 NOTE — ED Notes (Signed)
Agree with PA assessment 

## 2015-11-13 NOTE — ED Notes (Signed)
Pt reports right toenail injury from moving furniture.  The toenail to the right great toe is pushed upward exposing the nail bed.

## 2015-11-14 ENCOUNTER — Emergency Department (HOSPITAL_COMMUNITY)
Admission: EM | Admit: 2015-11-14 | Discharge: 2015-11-14 | Disposition: A | Discharge status: Home / Self Care | Payer: Medicaid Other | Attending: Emergency Medicine | Admitting: Emergency Medicine

## 2015-11-14 ENCOUNTER — Encounter (HOSPITAL_COMMUNITY): Payer: Self-pay

## 2015-11-14 DIAGNOSIS — Z8619 Personal history of other infectious and parasitic diseases: Secondary | ICD-10-CM | POA: Diagnosis not present

## 2015-11-14 DIAGNOSIS — Z87891 Personal history of nicotine dependence: Secondary | ICD-10-CM | POA: Diagnosis not present

## 2015-11-14 DIAGNOSIS — R112 Nausea with vomiting, unspecified: Secondary | ICD-10-CM | POA: Insufficient documentation

## 2015-11-14 DIAGNOSIS — R110 Nausea: Secondary | ICD-10-CM

## 2015-11-14 DIAGNOSIS — Z3202 Encounter for pregnancy test, result negative: Secondary | ICD-10-CM | POA: Insufficient documentation

## 2015-11-14 LAB — URINE MICROSCOPIC-ADD ON: Bacteria, UA: NONE SEEN

## 2015-11-14 LAB — COMPREHENSIVE METABOLIC PANEL WITH GFR
ALT: 14 U/L (ref 14–54)
AST: 14 U/L — ABNORMAL LOW (ref 15–41)
Albumin: 3.9 g/dL (ref 3.5–5.0)
Alkaline Phosphatase: 95 U/L (ref 38–126)
Anion gap: 10 (ref 5–15)
BUN: <5 mg/dL — ABNORMAL LOW (ref 6–20)
CO2: 25 mmol/L (ref 22–32)
Calcium: 9.5 mg/dL (ref 8.9–10.3)
Chloride: 105 mmol/L (ref 101–111)
Creatinine, Ser: 0.7 mg/dL (ref 0.44–1.00)
GFR calc Af Amer: >60 mL/min
GFR calc non Af Amer: >60 mL/min
Glucose, Bld: 91 mg/dL (ref 65–99)
Potassium: 3.8 mmol/L (ref 3.5–5.1)
Sodium: 140 mmol/L (ref 135–145)
Total Bilirubin: 0.5 mg/dL (ref 0.3–1.2)
Total Protein: 7.2 g/dL (ref 6.5–8.1)

## 2015-11-14 LAB — URINALYSIS, ROUTINE W REFLEX MICROSCOPIC
Bilirubin Urine: NEGATIVE
Glucose, UA: NEGATIVE mg/dL
Ketones, ur: NEGATIVE mg/dL
Nitrite: NEGATIVE
Protein, ur: NEGATIVE mg/dL
Specific Gravity, Urine: 1.009 (ref 1.005–1.030)
pH: 6 (ref 5.0–8.0)

## 2015-11-14 LAB — HCG, QUANTITATIVE, PREGNANCY: hCG, Beta Chain, Quant, S: <1 m[IU]/mL

## 2015-11-14 LAB — CBC
HCT: 37.2 % (ref 36.0–46.0)
Hemoglobin: 12.2 g/dL (ref 12.0–15.0)
MCH: 28.9 pg (ref 26.0–34.0)
MCHC: 32.8 g/dL (ref 30.0–36.0)
MCV: 88.2 fL (ref 78.0–100.0)
PLATELETS: 297 10*3/uL (ref 150–400)
RBC: 4.22 MIL/uL (ref 3.87–5.11)
RDW: 12.6 % (ref 11.5–15.5)
WBC: 7.7 10*3/uL (ref 4.0–10.5)

## 2015-11-14 LAB — LIPASE, BLOOD: Lipase: 18 U/L (ref 11–51)

## 2015-11-14 LAB — POC URINE PREG, ED: Preg Test, Ur: NEGATIVE

## 2015-11-14 MED ORDER — METOCLOPRAMIDE HCL 10 MG PO TABS: 10.0000 mg | ORAL_TABLET | Freq: Four times a day (QID) | ORAL | Status: DC

## 2015-11-14 MED ORDER — METOCLOPRAMIDE HCL 5 MG/ML IJ SOLN
10.0000 mg | Freq: Once | INTRAMUSCULAR | Status: AC
  Administered 2015-11-14: 10 mg via INTRAVENOUS
  Filled 2015-11-14: qty 2

## 2015-11-14 MED ORDER — DIPHENHYDRAMINE HCL 50 MG/ML IJ SOLN
12.5000 mg | Freq: Once | INTRAMUSCULAR | Status: AC
  Administered 2015-11-14: 12.5 mg via INTRAVENOUS
  Filled 2015-11-14: qty 1

## 2015-11-14 MED ORDER — PANTOPRAZOLE SODIUM 20 MG PO TBEC: 20.0000 mg | DELAYED_RELEASE_TABLET | Freq: Every day | ORAL | Status: DC

## 2015-11-14 NOTE — ED Notes (Signed)
Patient reports that she has had intermittent vomiting x 3 weeks. [facility] and reports negative preg and negative STD. No diarrhea

## 2015-11-14 NOTE — ED Notes (Signed)
Patient to ED for eval of intermittent nausea and vomiting x 3 weeks. Associated with no abdominal pain. Has been seen for same with no diagnosis. Patient states she has vomiting daily in moderate amount, is not always associated with eating. No tenderness to abdomen. No urinary symptoms.

## 2015-11-14 NOTE — ED Provider Notes (Signed)
CSN: [id number]     Arrival date & time 11/14/15  1108 History   First MD Initiated Contact with Patient 11/14/15 1139     Chief Complaint  Patient presents with  . Emesis     (Consider location/radiation/quality/duration/timing/severity/associated sxs/prior Treatment) HPI Comments: Patient here complaining of intermittent nausea and vomiting 3 weeks. Denies any associated abdominal pain or fever. No diarrhea noted. Symptoms do become worse after she eats food. Has been seen for this complaint recently without a diagnosis. Denies any urinary symptoms. Her vomiting has been nonbilious nonbloody. Denies any consistent use of NSAIDs. No treatment use prior to arrival. Last menstrual period was 3 days ago  Patient is a 20 y.o. female presenting with vomiting. The history is provided by the patient.  Emesis   Past Medical History  Diagnosis Date  . Gonorrhea April 2015   Past Surgical History  Procedure Laterality Date  . Rhytidectomy neck / cheek / chin    . Hand surgery      Left  hand  . Hand surgery      left   . Chin surgery     Family History  Problem Relation Age of Onset  . [place] Mother   . Miscarriages / Stillbirths Sister   . Miscarriages / Stillbirths Maternal Grandmother   . Alcohol abuse Neg Hx   . Arthritis Neg Hx   . Asthma Neg Hx   . COPD Neg Hx   . Cancer Neg Hx   . Birth defects Neg Hx   . Depression Neg Hx   . Diabetes Neg Hx   . Drug abuse Neg Hx   . Early death Neg Hx   . Hearing loss Neg Hx   . Heart disease Neg Hx   . Hyperlipidemia Neg Hx   . Hypertension Neg Hx   . Kidney disease Neg Hx   . Learning disabilities Neg Hx   . Mental illness Neg Hx   . Mental retardation Neg Hx   . Stroke Neg Hx   . Vision loss Neg Hx   . Varicose Veins Neg Hx   . Migraines Neg Hx    Social History  Substance Use Topics  . Smoking status: Former Smoker    Types: Cigars    Quit date: 08/04/2013  . Smokeless tobacco: Never Used  .  Alcohol Use: No   OB History    Gravida Para Term Preterm AB TAB SAB Ectopic Multiple Living   3 2 2  0 1 0 1 0 0 2     Review of Systems  Gastrointestinal: Positive for vomiting.  All other systems reviewed and are negative.     Allergies  Review of patient's allergies indicates no known allergies.  Home Medications   Prior to Admission medications   Medication Sig Start Date End Date Taking? Authorizing Provider  etonogestrel-ethinyl estradiol (NUVARING) 0.12-0.015 MG/24HR vaginal ring Insert vaginally and leave in place for 3 consecutive weeks, then remove for 1 week. 10/23/15   Rachelle A Denney, CNM  HYDROcodone-acetaminophen (NORCO/VICODIN) 5-325 MG tablet Take 1-2 tablets by mouth every 6 hours as needed for pain and/or cough. 11/13/15   Nicole Pisciotta, PA-C  ondansetron (ZOFRAN) 4 MG tablet Take 1 tablet (4 mg total) by mouth every 8 (eight) hours as needed for nausea. May cause constipation 11/02/15   Hayden Rasmussenavid Mabe, NP   BP 124/75 mmHg  Pulse 76  Temp(Src) 98.3 F (36.8 C) (Oral)  Resp 18  SpO2 100%  LMP  10/05/2015 (Exact Date) Physical Exam  Constitutional: She is oriented to person, place, and time. She appears well-developed and well-nourished.  Non-toxic appearance. No distress.  HENT:  Head: Normocephalic and atraumatic.  Eyes: Conjunctivae, EOM and lids are normal. Pupils are equal, round, and reactive to light.  Neck: Normal range of motion. Neck supple. No tracheal deviation present. No thyroid mass present.  Cardiovascular: Normal rate, regular rhythm and normal heart sounds.  Exam reveals no gallop.   No murmur heard. Pulmonary/Chest: Effort normal and breath sounds normal. No stridor. No respiratory distress. She has no decreased breath sounds. She has no wheezes. She has no rhonchi. She has no rales.  Abdominal: Soft. Normal appearance and bowel sounds are normal. She exhibits no distension. There is no tenderness. There is no rebound and no CVA tenderness.   Musculoskeletal: Normal range of motion. She exhibits no edema or tenderness.  Neurological: She is alert and oriented to person, place, and time. She has normal strength. No cranial nerve deficit or sensory deficit. GCS eye subscore is 4. GCS verbal subscore is 5. GCS motor subscore is 6.  Skin: Skin is warm and dry. No abrasion and no rash noted.  Psychiatric: She has a normal mood and affect. Her speech is normal and behavior is normal.  Nursing note and vitals reviewed.   ED Course  Procedures (including critical care time) Labs Review Labs Reviewed  LIPASE, BLOOD  COMPREHENSIVE METABOLIC PANEL  CBC  HCG, QUANTITATIVE, PREGNANCY  URINALYSIS, ROUTINE W REFLEX MICROSCOPIC (NOT AT [facility])    Imaging Review No results found. I have personally reviewed and evaluated these images and lab results as part of my medical decision-making.   EKG Interpretation None      MDM   Final diagnoses:  None    Patient given IV fluids and medication for emesis here. Feels better. Abdominal exam is without tenderness or guarding or rebound. She has had this nausea for several weeks. Will place on PPI as well as get GI referral as well as placed on medication for nausea    Lorre Nick, MD 11/14/15 1419

## 2015-11-14 NOTE — ED Notes (Signed)
Urine sample sent

## 2015-11-14 NOTE — ED Notes (Signed)
Wrong slip sent on patient's urine. RN explain to patient what happened. Pt. sts understanding and will give another specimen.

## 2015-11-14 NOTE — Discharge Instructions (Signed)

## 2015-11-18 ENCOUNTER — Ambulatory Visit: Payer: Self-pay | Admitting: Certified Nurse Midwife

## 2015-12-08 ENCOUNTER — Encounter: Payer: Self-pay | Admitting: Certified Nurse Midwife

## 2015-12-08 ENCOUNTER — Ambulatory Visit (INDEPENDENT_AMBULATORY_CARE_PROVIDER_SITE_OTHER): Payer: Medicaid Other | Admitting: Certified Nurse Midwife

## 2015-12-08 VITALS — BP 130/81 | HR 71 | Wt 196.0 lb

## 2015-12-08 DIAGNOSIS — N939 Abnormal uterine and vaginal bleeding, unspecified: Secondary | ICD-10-CM | POA: Diagnosis not present

## 2015-12-08 DIAGNOSIS — R1084 Generalized abdominal pain: Secondary | ICD-10-CM

## 2015-12-08 LAB — POCT URINALYSIS DIPSTICK
BILIRUBIN UA: NEGATIVE
Blood, UA: NEGATIVE
GLUCOSE UA: NEGATIVE
KETONES UA: NEGATIVE
LEUKOCYTES UA: NEGATIVE
NITRITE UA: NEGATIVE
Protein, UA: NEGATIVE
Spec Grav, UA: 1.015
Urobilinogen, UA: NEGATIVE
pH, UA: 7

## 2015-12-08 MED ORDER — PRENATE PIXIE 10-0.6-0.4-200 MG PO CAPS: 1.0000 | ORAL_CAPSULE | Freq: Every day | ORAL | Status: DC

## 2015-12-08 MED ORDER — IBUPROFEN 800 MG PO TABS: 800.0000 mg | ORAL_TABLET | Freq: Three times a day (TID) | ORAL | Status: DC | PRN

## 2015-12-08 NOTE — Progress Notes (Signed)
Patient ID: Nicole Mclean, female   DOB: 1996-03-19, 20 y.o.   MRN: [id number]   Chief Complaint  Patient presents with  . Abnormal bleeding    Spotting in between cycle    HPI Nicole Mclean is a 20 y.o. female.  Here for f/u on AUB with her menses.  Has a long hx of AUB has tried OCPs, Mirena and other birth control methods in the past.  Mirena was expelled.  Had AUB with OCPs.  Is not good with remembering to take the pills. Has been on nuva ring for the last several months.  Recently changed Nuva Ring after her last period around the 18th of May.  States that she had two large clots.  Has not been taking any vitamins.  Started on vitamins today. Has not tried anything for the cramping; started on Ibuprofen. Desires to try the Nuva Ring for a few more months.  Is with the same partner as her children.  Is currently sexually active.  Has taken at home pregnancy tests that have been negative.  States that she got pregnant on the Depo injections and that she bled with the Depo injections.  States that she has lower pelvic pain during her cycle.  Denies any change in diet, constipation or hard stools, reports having regular soft bowel movements daily.       HPI  Past Medical History  Diagnosis Date  . Gonorrhea April 2015    Past Surgical History  Procedure Laterality Date  . Rhytidectomy neck / cheek / chin    . Hand surgery      Left  hand  . Hand surgery      left   . Chin surgery      Family History  Problem Relation Age of Onset  . Miscarriages / [place] Mother   . Miscarriages / Stillbirths Sister   . Miscarriages / Stillbirths Maternal Grandmother   . Alcohol abuse Neg Hx   . Arthritis Neg Hx   . Asthma Neg Hx   . COPD Neg Hx   . Cancer Neg Hx   . Birth defects Neg Hx   . Depression Neg Hx   . Diabetes Neg Hx   . Drug abuse Neg Hx   . Early death Neg Hx   . Hearing loss Neg Hx   . Heart disease Neg Hx   . Hyperlipidemia Neg Hx   . Hypertension Neg Hx   . Kidney  disease Neg Hx   . Learning disabilities Neg Hx   . Mental illness Neg Hx   . Mental retardation Neg Hx   . Stroke Neg Hx   . Vision loss Neg Hx   . Varicose Veins Neg Hx   . Migraines Neg Hx     Social History Social History  Substance Use Topics  . Smoking status: Former Smoker    Types: Cigars    Quit date: 08/04/2013  . Smokeless tobacco: Never Used  . Alcohol Use: No    No Known Allergies  Current Outpatient Prescriptions  Medication Sig Dispense Refill  . etonogestrel-ethinyl estradiol (NUVARING) 0.12-0.015 MG/24HR vaginal ring Insert vaginally and leave in place for 3 consecutive weeks, then remove for 1 week. 1 each 12  . HYDROcodone-acetaminophen (NORCO/VICODIN) 5-325 MG tablet Take 1-2 tablets by mouth every 6 hours as needed for pain and/or cough. 7 tablet 0  . ibuprofen (ADVIL,MOTRIN) 800 MG tablet Take 1 tablet (800 mg total) by mouth every 8 (eight) hours  as needed. 60 tablet 1  . Prenat-FeAsp-Meth-FA-DHA w/o A (PRENATE PIXIE) 10-0.6-0.4-200 MG CAPS Take 1 tablet by mouth daily. 30 capsule 12   No current facility-administered medications for this visit.    Review of Systems Review of Systems Constitutional: negative for fatigue and weight loss Respiratory: negative for cough and wheezing Cardiovascular: negative for chest pain, fatigue and palpitations Gastrointestinal: negative for abdominal pain and change in bowel habits Genitourinary:negative Integument/breast: negative for nipple discharge Musculoskeletal:negative for myalgias Neurological: negative for gait problems and tremors Behavioral/Psych: negative for abusive relationship, depression Endocrine: negative for temperature intolerance     Blood pressure 130/81, pulse 71, weight 196 lb (88.905 kg), last menstrual period 11/03/2015, not currently breastfeeding.  Physical Exam Physical Exam General:   alert  Skin:   no rash or abnormalities  Lungs:   clear to auscultation bilaterally  Heart:    regular rate and rhythm, S1, S2 normal, no murmur, click, rub or gallop  Breasts:   normal without suspicious masses, skin or nipple changes or axillary nodes  Abdomen:  normal findings: no organomegaly, soft, non-tender and no hernia  Pelvis:  External genitalia: normal general appearance Urinary system: urethral meatus normal and bladder without fullness, nontender Vaginal: normal without tenderness, induration or masses Cervix: normal appearance Adnexa: normal bimanual exam Uterus: anteverted and non-tender, normal size    50% of 15 min visit spent on counseling and coordination of care.   Data Reviewed Previous medical hx, meds  Assessment     AUB d/t possible recent change to Nuva Ring  Lower pelvic pain    Plan    Orders Placed This Encounter  Procedures  . US Pelvis Complete    Standing Status: Future     Number of Occurrences:      Standing Expiration Date: 02/06/2017    Order Specific Question:  Reason for Exam (SYMPTOM  OR DIAGNOSIS REQUIRED)    Answer:  AUB with nuva ring    Order Specific Question:  Preferred imaging location?    Answer:  Internal  . US Transvaginal Non-OB    Standing Status: Future     Number of Occurrences:      Standing Expiration Date: 02/06/2017    Order Specific Question:  Reason for Exam (SYMPTOM  OR DIAGNOSIS REQUIRED)    Answer:  AUB with nuva ring, abdominal pain    Order Specific Question:  Preferred imaging location?    Answer:  Internal  . POCT Urinalysis Dipstick   Meds ordered this encounter  Medications  . ibuprofen (ADVIL,MOTRIN) 800 MG tablet    Sig: Take 1 tablet (800 mg total) by mouth every 8 (eight) hours as needed.    Dispense:  60 tablet    Refill:  1  . Prenat-FeAsp-Meth-FA-DHA w/o A (PRENATE PIXIE) 10-0.6-0.4-200 MG CAPS    Sig: Take 1 tablet by mouth daily.    Dispense:  30 capsule    Refill:  12   Possible management options include: Nexplanon?  Follow up after pelvic [place]

## 2015-12-10 ENCOUNTER — Ambulatory Visit: Payer: Self-pay | Admitting: Certified Nurse Midwife

## 2015-12-11 LAB — NUSWAB VG+, CANDIDA 6SP
ATOPOBIUM VAGINAE: HIGH {score} — AB
BVAB 2: HIGH Score — AB
CANDIDA KRUSEI, NAA: NEGATIVE
CANDIDA LUSITANIAE, NAA: POSITIVE — AB
CANDIDA PARAPSILOSIS, NAA: NEGATIVE
CANDIDA TROPICALIS, NAA: POSITIVE — AB
Candida albicans, NAA: NEGATIVE
Candida glabrata, NAA: NEGATIVE
Chlamydia trachomatis, NAA: NEGATIVE
Megasphaera 1: HIGH Score — AB
Neisseria gonorrhoeae, NAA: NEGATIVE
TRICH VAG BY NAA: NEGATIVE

## 2015-12-14 ENCOUNTER — Other Ambulatory Visit: Payer: Self-pay | Admitting: Certified Nurse Midwife

## 2015-12-14 DIAGNOSIS — B3731 Acute candidiasis of vulva and vagina: Secondary | ICD-10-CM

## 2015-12-14 DIAGNOSIS — N760 Acute vaginitis: Principal | ICD-10-CM

## 2015-12-14 DIAGNOSIS — B373 Candidiasis of vulva and vagina: Secondary | ICD-10-CM

## 2015-12-14 DIAGNOSIS — B9689 Other specified bacterial agents as the cause of diseases classified elsewhere: Secondary | ICD-10-CM

## 2015-12-14 MED ORDER — FLUCONAZOLE 100 MG PO TABS: 100.0000 mg | ORAL_TABLET | Freq: Once | ORAL | Status: DC

## 2015-12-14 MED ORDER — TERCONAZOLE 0.4 % VA CREA: 1.0000 | TOPICAL_CREAM | Freq: Every day | VAGINAL | Status: DC

## 2015-12-14 MED ORDER — METRONIDAZOLE 500 MG PO TABS: 500.0000 mg | ORAL_TABLET | Freq: Two times a day (BID) | ORAL | Status: DC

## 2015-12-15 ENCOUNTER — Ambulatory Visit (INDEPENDENT_AMBULATORY_CARE_PROVIDER_SITE_OTHER): Payer: Medicaid Other | Admitting: Certified Nurse Midwife

## 2015-12-15 ENCOUNTER — Ambulatory Visit (INDEPENDENT_AMBULATORY_CARE_PROVIDER_SITE_OTHER): Payer: Medicaid Other

## 2015-12-15 VITALS — BP 108/75 | HR 61 | Wt 194.0 lb

## 2015-12-15 DIAGNOSIS — Z3202 Encounter for pregnancy test, result negative: Secondary | ICD-10-CM | POA: Diagnosis not present

## 2015-12-15 DIAGNOSIS — N939 Abnormal uterine and vaginal bleeding, unspecified: Secondary | ICD-10-CM | POA: Diagnosis not present

## 2015-12-15 DIAGNOSIS — R1084 Generalized abdominal pain: Secondary | ICD-10-CM

## 2015-12-15 DIAGNOSIS — N921 Excessive and frequent menstruation with irregular cycle: Secondary | ICD-10-CM | POA: Diagnosis not present

## 2015-12-15 DIAGNOSIS — Z975 Presence of (intrauterine) contraceptive device: Principal | ICD-10-CM

## 2015-12-15 LAB — POCT URINE PREGNANCY: PREG TEST UR: NEGATIVE

## 2015-12-16 ENCOUNTER — Encounter: Payer: Self-pay | Admitting: Certified Nurse Midwife

## 2015-12-16 NOTE — Progress Notes (Signed)
Patient ID: Nicole Mclean, female   DOB: 1995/12/04, 20 y.o.   MRN: [id number]  Chief Complaint  Patient presents with  . Follow-up    ultrasound    HPI Nicole Mclean is a 20 y.o. female. Here for f/u on AUB with her menses. Has a long hx of AUB has tried OCPs, Mirena and other birth control methods in the past. Mirena was expelled. Had AUB with OCPs. Is not good with remembering to take the pills. Has been on nuva ring for the last several months. Recently changed Nuva Ring after her last period around the 18th of May.Has been having a period since taking out the [employer].  States that she had two large clots. Has not been taking any vitamins. Started on vitamins today. Has not tried anything for the cramping; started on Ibuprofen. Desires to try the Nuva Ring for a few more months. Is with the same partner as her children. Is currently sexually active. Has taken at home pregnancy tests that have been negative. States that she got pregnant on the Depo injections and that she bled with the Depo injections. States that she has lower pelvic pain during her cycle. Denies any change in diet, constipation or hard stools, reports having regular soft bowel movements daily.  Discussed placing Nuva ring today and leaving it in for 4 weeks and then changing it on the 30th of every month for a few months to see how she tolerates that schedule.  She likes the [employer] over all.  States that she has clots, large sized at the end of her period and light bleeding in the begining of the period.   HPI  Past Medical History  Diagnosis Date  . Gonorrhea April 2015    Past Surgical History  Procedure Laterality Date  . Rhytidectomy neck / cheek / chin    . Hand surgery      Left  hand  . Hand surgery      left   . Chin surgery      Family History  Problem Relation Age of Onset  . Miscarriages / [place] Mother   . Miscarriages / Stillbirths Sister   . Miscarriages / Stillbirths Maternal  Grandmother   . Alcohol abuse Neg Hx   . Arthritis Neg Hx   . Asthma Neg Hx   . COPD Neg Hx   . Cancer Neg Hx   . Birth defects Neg Hx   . Depression Neg Hx   . Diabetes Neg Hx   . Drug abuse Neg Hx   . Early death Neg Hx   . Hearing loss Neg Hx   . Heart disease Neg Hx   . Hyperlipidemia Neg Hx   . Hypertension Neg Hx   . Kidney disease Neg Hx   . Learning disabilities Neg Hx   . Mental illness Neg Hx   . Mental retardation Neg Hx   . Stroke Neg Hx   . Vision loss Neg Hx   . Varicose Veins Neg Hx   . Migraines Neg Hx     Social History Social History  Substance Use Topics  . Smoking status: Former Smoker    Types: Cigars    Quit date: 08/04/2013  . Smokeless tobacco: Never Used  . Alcohol Use: No    No Known Allergies  Current Outpatient Prescriptions  Medication Sig Dispense Refill  . etonogestrel-ethinyl estradiol (NUVARING) 0.12-0.015 MG/24HR vaginal ring Insert vaginally and leave in place for 3 consecutive  weeks, then remove for 1 week. 1 each 12  . fluconazole (DIFLUCAN) 100 MG tablet Take 1 tablet (100 mg total) by mouth once. Repeat dose in 48-72 hour. (Patient not taking: Reported on 12/15/2015) 3 tablet 0  . HYDROcodone-acetaminophen (NORCO/VICODIN) 5-325 MG tablet Take 1-2 tablets by mouth every 6 hours as needed for pain and/or cough. 7 tablet 0  . ibuprofen (ADVIL,MOTRIN) 800 MG tablet Take 1 tablet (800 mg total) by mouth every 8 (eight) hours as needed. 60 tablet 1  . metroNIDAZOLE (FLAGYL) 500 MG tablet Take 1 tablet (500 mg total) by mouth 2 (two) times daily. (Patient not taking: Reported on 12/15/2015) 14 tablet 0  . Prenat-FeAsp-Meth-FA-DHA w/o A (PRENATE PIXIE) 10-0.6-0.4-200 MG CAPS Take 1 tablet by mouth daily. 30 capsule 12  . terconazole (TERAZOL 7) 0.4 % vaginal cream Place 1 applicator vaginally at bedtime. (Patient not taking: Reported on 12/15/2015) 45 g 0   No current facility-administered medications for this visit.    Review of  Systems Review of Systems Constitutional: negative for fatigue and weight loss Respiratory: negative for cough and wheezing Cardiovascular: negative for chest pain, fatigue and palpitations Gastrointestinal: negative for abdominal pain and change in bowel habits Genitourinary:negative Integument/breast: negative for nipple discharge Musculoskeletal:negative for myalgias Neurological: negative for gait problems and tremors Behavioral/Psych: negative for abusive relationship, depression Endocrine: negative for temperature intolerance     Blood pressure 108/75, pulse 61, weight 194 lb (87.998 kg), last menstrual period 11/28/2015, not currently breastfeeding.  Physical Exam Physical Exam: deferred General:   alert  Pelvic US 12/15/15:  Endo Thickness: 8.71mm, Left ovary: 9.655, Right ovary: 5.036cm, CDS fluid: 0.88cm.  Impression: normal pelvic US   100% of 15 min visit spent on counseling and coordination of care.   Data Reviewed Previous medical records, labs, meds, [place]  Assessment     AUB with Nuva Ring Normal pelvic US     Plan    Orders Placed This Encounter  Procedures  . POCT urine pregnancy   No orders of the defined types were placed in this encounter.    Possible management options include: Paraguard, condoms, natural family planning methods Follow up as needed.

## 2016-01-13 ENCOUNTER — Ambulatory Visit (INDEPENDENT_AMBULATORY_CARE_PROVIDER_SITE_OTHER): Payer: Medicaid Other | Admitting: Certified Nurse Midwife

## 2016-01-13 ENCOUNTER — Encounter: Payer: Self-pay | Admitting: Gastroenterology

## 2016-01-13 ENCOUNTER — Encounter: Payer: Self-pay | Admitting: Certified Nurse Midwife

## 2016-01-13 VITALS — BP 131/87 | HR 104

## 2016-01-13 DIAGNOSIS — R1084 Generalized abdominal pain: Secondary | ICD-10-CM | POA: Diagnosis not present

## 2016-01-13 DIAGNOSIS — N898 Other specified noninflammatory disorders of vagina: Secondary | ICD-10-CM

## 2016-01-13 DIAGNOSIS — N760 Acute vaginitis: Secondary | ICD-10-CM

## 2016-01-13 DIAGNOSIS — B9689 Other specified bacterial agents as the cause of diseases classified elsewhere: Secondary | ICD-10-CM

## 2016-01-13 DIAGNOSIS — R5382 Chronic fatigue, unspecified: Secondary | ICD-10-CM | POA: Diagnosis not present

## 2016-01-13 DIAGNOSIS — A499 Bacterial infection, unspecified: Secondary | ICD-10-CM

## 2016-01-13 DIAGNOSIS — R110 Nausea: Secondary | ICD-10-CM

## 2016-01-13 DIAGNOSIS — N939 Abnormal uterine and vaginal bleeding, unspecified: Secondary | ICD-10-CM | POA: Diagnosis not present

## 2016-01-13 DIAGNOSIS — R112 Nausea with vomiting, unspecified: Secondary | ICD-10-CM

## 2016-01-13 LAB — POCT URINALYSIS DIPSTICK
Bilirubin, UA: NEGATIVE
Blood, UA: NEGATIVE
Glucose, UA: NEGATIVE
Ketones, UA: NEGATIVE
LEUKOCYTES UA: NEGATIVE
NITRITE UA: NEGATIVE
PROTEIN UA: NEGATIVE
Spec Grav, UA: 1.015
UROBILINOGEN UA: NEGATIVE
pH, UA: 6

## 2016-01-13 MED ORDER — FLUCONAZOLE 200 MG PO TABS: 200.0000 mg | ORAL_TABLET | Freq: Once | ORAL | Status: DC

## 2016-01-13 MED ORDER — OMEPRAZOLE 20 MG PO CPDR: 20.0000 mg | DELAYED_RELEASE_CAPSULE | Freq: Two times a day (BID) | ORAL | Status: DC

## 2016-01-13 MED ORDER — METRONIDAZOLE 0.75 % VA GEL: 1.0000 | Freq: Two times a day (BID) | VAGINAL | Status: DC

## 2016-01-13 MED ORDER — PRENATE PIXIE 10-0.6-0.4-200 MG PO CAPS: 1.0000 | ORAL_CAPSULE | Freq: Every day | ORAL | Status: DC

## 2016-01-13 MED ORDER — ONDANSETRON HCL 4 MG PO TABS: 4.0000 mg | ORAL_TABLET | Freq: Every day | ORAL | Status: DC | PRN

## 2016-01-13 NOTE — Progress Notes (Signed)
Patient ID: Nicole Mclean, female   DOB: Jan 21, 1996, 21 y.o.   MRN: [id number]   Chief Complaint  Patient presents with  . Vaginal Itching    HPI Ellagrace E Para MarchDuncan is a 20 y.o. female.  Here for vaginal discharge with itching for 2 weeks, has tried OTC monistat without relief.  Also, states that she has had N&V all day every day for 2 weeks, denies any fever, exposure to illness, anxiety, stress or diarrhea.  Has been using her contraception.  Is currently sexually active.  LMP 12/24/15.      HPI  Past Medical History  Diagnosis Date  . Gonorrhea April 2015    Past Surgical History  Procedure Laterality Date  . Rhytidectomy neck / cheek / chin    . Hand surgery      Left  hand  . Hand surgery      left   . Chin surgery      Family History  Problem Relation Age of Onset  . Miscarriages / [place] Mother   . Miscarriages / Stillbirths Sister   . Miscarriages / Stillbirths Maternal Grandmother   . Alcohol abuse Neg Hx   . Arthritis Neg Hx   . Asthma Neg Hx   . COPD Neg Hx   . Cancer Neg Hx   . Birth defects Neg Hx   . Depression Neg Hx   . Diabetes Neg Hx   . Drug abuse Neg Hx   . Early death Neg Hx   . Hearing loss Neg Hx   . Heart disease Neg Hx   . Hyperlipidemia Neg Hx   . Hypertension Neg Hx   . Kidney disease Neg Hx   . Learning disabilities Neg Hx   . Mental illness Neg Hx   . Mental retardation Neg Hx   . Stroke Neg Hx   . Vision loss Neg Hx   . Varicose Veins Neg Hx   . Migraines Neg Hx     Social History Social History  Substance Use Topics  . Smoking status: Former Smoker    Types: Cigars    Quit date: 08/04/2013  . Smokeless tobacco: Never Used  . Alcohol Use: No    No Known Allergies  Current Outpatient Prescriptions  Medication Sig Dispense Refill  . etonogestrel-ethinyl estradiol (NUVARING) 0.12-0.015 MG/24HR vaginal ring Insert vaginally and leave in place for 3 consecutive weeks, then remove for 1 week. 1 each 12  . ibuprofen  (ADVIL,MOTRIN) 800 MG tablet Take 1 tablet (800 mg total) by mouth every 8 (eight) hours as needed. 60 tablet 1   No current facility-administered medications for this visit.    Review of Systems Review of Systems Constitutional: + for fatigue and neg. weight loss Respiratory: negative for cough and wheezing Cardiovascular: negative for chest pain, + fatigue and neg. palpitations Gastrointestinal: negative for abdominal pain and change in bowel habits, +N&V Genitourinary: + vaginal discharge Integument/breast: negative for nipple discharge Musculoskeletal:negative for myalgias Neurological: negative for gait problems and tremors Behavioral/Psych: negative for abusive relationship, depression Endocrine: negative for temperature intolerance     Blood pressure 131/87, pulse 104, last menstrual period 11/28/2015, not currently breastfeeding.  Physical Exam Physical Exam General:   alert  Skin:   no rash or abnormalities  Lungs:   clear to auscultation bilaterally  Heart:   regular rate and rhythm, S1, S2 normal, no murmur, click, rub or gallop  Breasts:   deferred  Abdomen:  normal findings: no organomegaly, soft, non-tender  and no hernia  Pelvis:  External genitalia: normal general appearance Urinary system: urethral meatus normal and bladder without fullness, nontender Vaginal: normal without tenderness, induration or masses. +thin gray vaginal discharge, + white chunky vaginal discharge    50% of 15 min visit spent on counseling and coordination of care.   Data Reviewed Previous medical hx, meds, labs  Assessment     Chronic BV Yeast vaginitis N&V with unknown origin Fatigue     Plan    Orders Placed This Encounter  Procedures  . POCT urinalysis dipstick   No orders of the defined types were placed in this encounter.     Possible management options include: abdominal/pelvic US Follow up as needed.

## 2016-01-13 NOTE — Addendum Note (Signed)
Addended by: Marya LandryFOSTER, Ruari Duggan D on: 01/13/2016 03:52 PM   Modules accepted: Orders

## 2016-01-14 ENCOUNTER — Other Ambulatory Visit: Payer: Self-pay | Admitting: Certified Nurse Midwife

## 2016-01-14 LAB — COMPREHENSIVE METABOLIC PANEL
ALT: 19 IU/L (ref 0–32)
AST: 17 IU/L (ref 0–40)
Albumin/Globulin Ratio: 1.7 (ref 1.2–2.2)
Albumin: 4.5 g/dL (ref 3.5–5.5)
Alkaline Phosphatase: 105 IU/L (ref 39–117)
BILIRUBIN TOTAL: 0.2 mg/dL (ref 0.0–1.2)
BUN / CREAT RATIO: 16 (ref 9–23)
BUN: 11 mg/dL (ref 6–20)
CALCIUM: 9.8 mg/dL (ref 8.7–10.2)
CHLORIDE: 101 mmol/L (ref 96–106)
CO2: 25 mmol/L (ref 18–29)
CREATININE: 0.69 mg/dL (ref 0.57–1.00)
GFR, EST AFRICAN AMERICAN: 144 mL/min/{1.73_m2} (ref >59)
GFR, EST NON AFRICAN AMERICAN: 125 mL/min/{1.73_m2} (ref >59)
GLUCOSE: 133 mg/dL — AB (ref 65–99)
Globulin, Total: 2.7 g/dL (ref 1.5–4.5)
Potassium: 3.9 mmol/L (ref 3.5–5.2)
Sodium: 140 mmol/L (ref 134–144)
TOTAL PROTEIN: 7.2 g/dL (ref 6.0–8.5)

## 2016-01-14 LAB — CBC WITH DIFFERENTIAL/PLATELET
BASOS: 0 %
Basophils Absolute: 0 10*3/uL (ref 0.0–0.2)
EOS (ABSOLUTE): 0.1 10*3/uL (ref 0.0–0.4)
EOS: 1 %
HEMATOCRIT: 37.2 % (ref 34.0–46.6)
Hemoglobin: 12 g/dL (ref 11.1–15.9)
IMMATURE GRANS (ABS): 0 10*3/uL (ref 0.0–0.1)
IMMATURE GRANULOCYTES: 0 %
LYMPHS: 35 %
Lymphocytes Absolute: 3.1 10*3/uL (ref 0.7–3.1)
MCH: 28 pg (ref 26.6–33.0)
MCHC: 32.3 g/dL (ref 31.5–35.7)
MCV: 87 fL (ref 79–97)
MONOCYTES: 4 %
Monocytes Absolute: 0.3 10*3/uL (ref 0.1–0.9)
NEUTROS PCT: 60 %
Neutrophils Absolute: 5.3 10*3/uL (ref 1.4–7.0)
Platelets: 328 10*3/uL (ref 150–379)
RBC: 4.28 x10E6/uL (ref 3.77–5.28)
RDW: 14.4 % (ref 12.3–15.4)
WBC: 8.8 10*3/uL (ref 3.4–10.8)

## 2016-01-14 LAB — BETA HCG QUANT (REF LAB): hCG Quant: <1 m[IU]/mL

## 2016-01-14 LAB — TSH: TSH: 2.69 u[IU]/mL (ref 0.450–4.500)

## 2016-01-15 ENCOUNTER — Other Ambulatory Visit: Payer: Medicaid Other

## 2016-01-15 DIAGNOSIS — R7309 Other abnormal glucose: Secondary | ICD-10-CM

## 2016-01-16 DIAGNOSIS — A749 Chlamydial infection, unspecified: Secondary | ICD-10-CM

## 2016-01-16 HISTORY — DX: Chlamydial infection, unspecified: A74.9

## 2016-01-16 LAB — HEMOGLOBIN A1C
Est. average glucose Bld gHb Est-mCnc: 117 mg/dL
Hgb A1c MFr Bld: 5.7 % — ABNORMAL HIGH (ref 4.8–5.6)

## 2016-01-16 LAB — GLUCOSE, RANDOM: GLUCOSE: 86 mg/dL (ref 65–99)

## 2016-01-18 LAB — NUSWAB VG+, CANDIDA 6SP
CANDIDA ALBICANS, NAA: NEGATIVE
CANDIDA GLABRATA, NAA: NEGATIVE
CANDIDA KRUSEI, NAA: NEGATIVE
CANDIDA TROPICALIS, NAA: POSITIVE — AB
Candida lusitaniae, NAA: POSITIVE — AB
Candida parapsilosis, NAA: NEGATIVE
Chlamydia trachomatis, NAA: NEGATIVE
MEGASPHAERA 1: HIGH {score} — AB
NEISSERIA GONORRHOEAE, NAA: NEGATIVE
Trich vag by NAA: NEGATIVE

## 2016-01-20 ENCOUNTER — Other Ambulatory Visit (HOSPITAL_COMMUNITY): Payer: Self-pay | Admitting: Certified Nurse Midwife

## 2016-01-22 ENCOUNTER — Ambulatory Visit (INDEPENDENT_AMBULATORY_CARE_PROVIDER_SITE_OTHER): Payer: Medicaid Other | Admitting: Certified Nurse Midwife

## 2016-01-22 ENCOUNTER — Encounter: Payer: Self-pay | Admitting: Certified Nurse Midwife

## 2016-01-22 VITALS — BP 121/83 | HR 78 | Wt 195.0 lb

## 2016-01-22 DIAGNOSIS — R112 Nausea with vomiting, unspecified: Secondary | ICD-10-CM

## 2016-01-22 DIAGNOSIS — R1032 Left lower quadrant pain: Secondary | ICD-10-CM

## 2016-01-22 DIAGNOSIS — R110 Nausea: Secondary | ICD-10-CM

## 2016-01-22 LAB — POCT URINE PREGNANCY: Preg Test, Ur: NEGATIVE

## 2016-01-22 MED ORDER — PROCHLORPERAZINE MALEATE 10 MG PO TABS: 10.0000 mg | ORAL_TABLET | Freq: Four times a day (QID) | ORAL | Status: DC | PRN

## 2016-01-22 NOTE — Progress Notes (Signed)
Patient ID: Nicole Mclean, female   DOB: 17-Jun-1996, 20 y.o.   MRN: [id number]   Chief Complaint  Patient presents with  . Follow-up    birth control, nuva ring    HPI Nicole Mclean is a 20 y.o. female.  Here for birth control f/u visit.  States that she is still having issues with nausea, vomiting, and HA.  Has tried the Zofran without relief.   Has had negative home pregnancy tests.  States that she is having abdominal pain, constant throb in LLQ. Denies any ringing in the ears or hx of migraine HA. Has GI consult scheduled for September.  Has been to the ED for this problem.  Discussed lab results from last week:  Prediabetes.  States that her period is late, took out her Nuva ring 3 days ago and has not had a period.  Encouraged patient to give it several days before getting worried that there is a lot that could be throwing off her cycle: ie stress.  Denies any diarrhea or rectal bleeding.      HPI  Past Medical History  Diagnosis Date  . Gonorrhea April 2015    Past Surgical History  Procedure Laterality Date  . Rhytidectomy neck / cheek / chin    . Hand surgery      Left  hand  . Hand surgery      left   . Chin surgery      Family History  Problem Relation Age of Onset  . Miscarriages / [place] Mother   . Miscarriages / Stillbirths Sister   . Miscarriages / Stillbirths Maternal Grandmother   . Alcohol abuse Neg Hx   . Arthritis Neg Hx   . Asthma Neg Hx   . COPD Neg Hx   . Cancer Neg Hx   . Birth defects Neg Hx   . Depression Neg Hx   . Diabetes Neg Hx   . Drug abuse Neg Hx   . Early death Neg Hx   . Hearing loss Neg Hx   . Heart disease Neg Hx   . Hyperlipidemia Neg Hx   . Hypertension Neg Hx   . Kidney disease Neg Hx   . Learning disabilities Neg Hx   . Mental illness Neg Hx   . Mental retardation Neg Hx   . Stroke Neg Hx   . Vision loss Neg Hx   . Varicose Veins Neg Hx   . Migraines Neg Hx     Social History Social History  Substance Use Topics   . Smoking status: Former Smoker    Types: Cigars    Quit date: 08/04/2013  . Smokeless tobacco: Never Used  . Alcohol Use: No    No Known Allergies  Current Outpatient Prescriptions  Medication Sig Dispense Refill  . etonogestrel-ethinyl estradiol (NUVARING) 0.12-0.015 MG/24HR vaginal ring Insert vaginally and leave in place for 3 consecutive weeks, then remove for 1 week. 1 each 12  . ondansetron (ZOFRAN) 4 MG tablet Take 1 tablet (4 mg total) by mouth daily as needed. 30 tablet 1  . fluconazole (DIFLUCAN) 200 MG tablet Take 1 tablet (200 mg total) by mouth once. Repeat dose in 48-72 hours. (Patient not taking: Reported on 01/22/2016) 3 tablet 0  . ibuprofen (ADVIL,MOTRIN) 800 MG tablet Take 1 tablet (800 mg total) by mouth every 8 (eight) hours as needed. 60 tablet 1  . metroNIDAZOLE (METROGEL VAGINAL) 0.75 % vaginal gel Place 1 Applicatorful vaginally 2 (two) times daily. (Patient  not taking: Reported on 01/22/2016) 70 g 0  . omeprazole (PRILOSEC) 20 MG capsule Take 1 capsule (20 mg total) by mouth 2 (two) times daily before a meal. 60 capsule 1  . Prenat-FeAsp-Meth-FA-DHA w/o A (PRENATE PIXIE) 10-0.6-0.4-200 MG CAPS Take 1 tablet by mouth daily. 30 capsule 12   No current facility-administered medications for this visit.    Review of Systems Review of Systems Constitutional: negative for fatigue and weight loss Respiratory: negative for cough and wheezing Cardiovascular: negative for chest pain, fatigue and palpitations Gastrointestinal: + for abdominal pain; N&V;  and negative for change in bowel habits Genitourinary:negative Integument/breast: negative for nipple discharge Musculoskeletal:negative for myalgias Neurological: negative for gait problems and tremors Behavioral/Psych: negative for abusive relationship, depression Endocrine: negative for temperature intolerance     Blood pressure 121/83, pulse 78, weight 195 lb (88.451 kg), not currently breastfeeding.  Physical  Exam Physical Exam General:   alert  Skin:   no rash or abnormalities  Lungs:   clear to auscultation bilaterally  Heart:   regular rate and rhythm, S1, S2 normal, no murmur, click, rub or gallop  Breasts:   deferred  Abdomen:  normal findings: no organomegaly, soft, non-tender and no hernia  Pelvis:  deferred    502% of 15 min visit spent on counseling and coordination of care.   Data Reviewed Previous medical hx, meds, labs  Assessment     N&V with unknown origin Contraception management     Plan    Orders Placed This Encounter  Procedures  . US Pelvis Complete    Standing Status: Future     Number of Occurrences:      Standing Expiration Date: 03/24/2017    Order Specific Question:  Reason for Exam (SYMPTOM  OR DIAGNOSIS REQUIRED)    Answer:  LLQ pain, N&V with unknown cause    Order Specific Question:  Preferred imaging [facility]?    [facility]  . US Transvaginal Non-OB    Standing Status: Future     Number of Occurrences:      Standing Expiration Date: 03/24/2017    Order Specific Question:  Reason for Exam (SYMPTOM  OR DIAGNOSIS REQUIRED)    Answer:  LLQ pain, N&V with unknown cause    Order Specific Question:  Preferred imaging location?    [facility]  . POCT urine pregnancy   No orders of the defined types were placed in this encounter.     Possible management options include: neurology consult for HA, nutrition consult. Follow up as needed.

## 2016-01-23 ENCOUNTER — Encounter (HOSPITAL_COMMUNITY): Payer: Self-pay

## 2016-01-23 ENCOUNTER — Inpatient Hospital Stay (HOSPITAL_COMMUNITY)
Admission: AD | Admit: 2016-01-23 | Discharge: 2016-01-23 | Disposition: A | Discharge status: Home / Self Care | Payer: Medicaid Other | Source: Ambulatory Visit | Attending: Obstetrics and Gynecology | Admitting: Obstetrics and Gynecology

## 2016-01-23 DIAGNOSIS — B3731 Acute candidiasis of vulva and vagina: Secondary | ICD-10-CM

## 2016-01-23 DIAGNOSIS — R1032 Left lower quadrant pain: Secondary | ICD-10-CM | POA: Diagnosis present

## 2016-01-23 DIAGNOSIS — Z114 Encounter for screening for human immunodeficiency virus [HIV]: Secondary | ICD-10-CM | POA: Diagnosis not present

## 2016-01-23 DIAGNOSIS — Z113 Encounter for screening for infections with a predominantly sexual mode of transmission: Secondary | ICD-10-CM | POA: Insufficient documentation

## 2016-01-23 DIAGNOSIS — Z87891 Personal history of nicotine dependence: Secondary | ICD-10-CM | POA: Insufficient documentation

## 2016-01-23 DIAGNOSIS — B373 Candidiasis of vulva and vagina: Secondary | ICD-10-CM | POA: Diagnosis not present

## 2016-01-23 LAB — POCT PREGNANCY, URINE: PREG TEST UR: NEGATIVE

## 2016-01-23 LAB — URINALYSIS, ROUTINE W REFLEX MICROSCOPIC
Bilirubin Urine: NEGATIVE
GLUCOSE, UA: NEGATIVE mg/dL
HGB URINE DIPSTICK: NEGATIVE
Ketones, ur: NEGATIVE mg/dL
LEUKOCYTES UA: NEGATIVE
Nitrite: NEGATIVE
Protein, ur: NEGATIVE mg/dL
SPECIFIC GRAVITY, URINE: 1.025 (ref 1.005–1.030)
pH: 6 (ref 5.0–8.0)

## 2016-01-23 LAB — WET PREP, GENITAL
Clue Cells Wet Prep HPF POC: NONE SEEN
SPERM: NONE SEEN
TRICH WET PREP: NONE SEEN

## 2016-01-23 LAB — RAPID HIV SCREEN (HIV 1/2 AB+AG)
HIV 1/2 Antibodies: NONREACTIVE
HIV-1 P24 Antigen - HIV24: NONREACTIVE

## 2016-01-23 MED ORDER — FLUCONAZOLE 200 MG PO TABS: 200.0000 mg | ORAL_TABLET | Freq: Every day | ORAL | Status: AC

## 2016-01-23 NOTE — MAU Provider Note (Signed)
History   Nicole Mclean is a 20 Y.O. Female in with LLQ pain for several weeks. Had u/s in Dr. Thomes Lolling office which was normal. Also has another scan ordered for Wednesday. Denies STD risk but willing to be screened. Taking 400 motrin with little relief.  CSN: [id number]  Arrival date & time 01/23/16  1224   First Provider Initiated Contact with Patient 01/23/16 1412      Chief Complaint  Patient presents with  . Abdominal Cramping  . Dysuria    HPI  Past Medical History  Diagnosis Date  . Gonorrhea April 2015    Past Surgical History  Procedure Laterality Date  . Rhytidectomy neck / cheek / chin    . Hand surgery      Left  hand  . Hand surgery      left   . Chin surgery      Family History  Problem Relation Age of Onset  . Miscarriages / [place] Mother   . Miscarriages / Stillbirths Sister   . Miscarriages / Stillbirths Maternal Grandmother   . Alcohol abuse Neg Hx   . Arthritis Neg Hx   . Asthma Neg Hx   . COPD Neg Hx   . Cancer Neg Hx   . Birth defects Neg Hx   . Depression Neg Hx   . Diabetes Neg Hx   . Drug abuse Neg Hx   . Early death Neg Hx   . Hearing loss Neg Hx   . Heart disease Neg Hx   . Hyperlipidemia Neg Hx   . Hypertension Neg Hx   . Kidney disease Neg Hx   . Learning disabilities Neg Hx   . Mental illness Neg Hx   . Mental retardation Neg Hx   . Stroke Neg Hx   . Vision loss Neg Hx   . Varicose Veins Neg Hx   . Migraines Neg Hx     Social History  Substance Use Topics  . Smoking status: Former Smoker    Types: Cigars    Quit date: 08/04/2013  . Smokeless tobacco: Never Used  . Alcohol Use: No    OB History    Gravida Para Term Preterm AB TAB SAB Ectopic Multiple Living   0 1 0 1 0 0 2      Review of Systems  Constitutional: Negative.   HENT: Negative.   Eyes: Negative.   Respiratory: Negative.   Cardiovascular: Negative.   Gastrointestinal: Positive for abdominal pain.  Endocrine: Negative.   Genitourinary: Negative.    Musculoskeletal: Negative.   Skin: Negative.   Allergic/Immunologic: Negative.   Neurological: Negative.   Hematological: Negative.   Psychiatric/Behavioral: Negative.     Allergies  Review of patient's allergies indicates no known allergies.  Home Medications  No current outpatient prescriptions on file.  BP 124/80 mmHg  Pulse 94  Temp(Src) 99 F (37.2 C) (Oral)  Resp 18  Ht  (1.549 m)  Wt 196 lb 1.9 oz (88.959 kg)  BMI 37.08 kg/m2  LMP 12/25/2015  Physical Exam  Constitutional: She is oriented to person, place, and time. She appears well-developed and well-nourished.  HENT:  Head: Normocephalic.  Neck: Normal range of motion.  Cardiovascular: Normal rate, regular rhythm, normal heart sounds and intact distal pulses.   Pulmonary/Chest: Effort normal and breath sounds normal.  Abdominal: Soft. Bowel sounds are normal.  Genitourinary: Vagina normal and uterus normal.  Musculoskeletal: Normal range of motion.  Neurological: She is alert and oriented  to person, place, and time. She has normal reflexes.  Skin: Skin is warm and dry.  Psychiatric: She has a normal mood and affect. Her behavior is normal. Judgment and thought content normal.    MAU Course  Procedures (including critical care time)  Labs Reviewed  WET PREP, GENITAL  URINALYSIS, ROUTINE W REFLEX MICROSCOPIC ([facility])  RPR  RAPID HIV SCREEN (HIV 1/2 AB+AG)  GC/CHLAMYDIA PROBE AMP ([facility]) [facility]   No results found.   No diagnosis found.    MDM  LLQ pain Wet prep yeast, will tx appropriately . Chla, GC, To increase motrin to 800mg  q 8hr and to keep f/u appt with Dr. Clearance CootsHarper.

## 2016-01-23 NOTE — MAU Note (Addendum)
Went to doctor yesterday they don't know what is wrong with her, having nausea, 4 days late for period, thinks has UTI. On Nuva Ring because it's time for period, having cramping, goes to Dr. Clearance CootsHarper.

## 2016-01-23 NOTE — Discharge Instructions (Signed)

## 2016-01-24 LAB — RPR: RPR Ser Ql: NONREACTIVE

## 2016-01-25 ENCOUNTER — Ambulatory Visit: Payer: Self-pay | Admitting: Obstetrics

## 2016-01-25 LAB — GC/CHLAMYDIA PROBE AMP (~~LOC~~) NOT AT ARMC
CHLAMYDIA, DNA PROBE: NEGATIVE
NEISSERIA GONORRHEA: NEGATIVE

## 2016-01-27 ENCOUNTER — Ambulatory Visit (HOSPITAL_COMMUNITY)
Admission: RE | Admit: 2016-01-27 | Discharge: 2016-01-27 | Disposition: A | Discharge status: Home / Self Care | Payer: Medicaid Other | Source: Ambulatory Visit | Attending: Certified Nurse Midwife | Admitting: Certified Nurse Midwife

## 2016-01-27 DIAGNOSIS — R1032 Left lower quadrant pain: Secondary | ICD-10-CM | POA: Insufficient documentation

## 2016-01-27 DIAGNOSIS — R112 Nausea with vomiting, unspecified: Secondary | ICD-10-CM

## 2016-02-04 ENCOUNTER — Telehealth: Payer: Self-pay | Admitting: *Deleted

## 2016-02-04 NOTE — Telephone Encounter (Signed)
See note for this encounter. 

## 2016-02-06 ENCOUNTER — Emergency Department (HOSPITAL_COMMUNITY): Payer: Medicaid Other

## 2016-02-06 ENCOUNTER — Encounter (HOSPITAL_COMMUNITY): Payer: Self-pay | Admitting: Emergency Medicine

## 2016-02-06 ENCOUNTER — Emergency Department (HOSPITAL_COMMUNITY)
Admission: EM | Admit: 2016-02-06 | Discharge: 2016-02-06 | Discharge status: Against Medical Advice | Payer: Medicaid Other | Attending: Emergency Medicine | Admitting: Emergency Medicine

## 2016-02-06 DIAGNOSIS — R1021 Pelvic and perineal pain right side: Secondary | ICD-10-CM

## 2016-02-06 DIAGNOSIS — R102 Pelvic and perineal pain: Secondary | ICD-10-CM | POA: Diagnosis not present

## 2016-02-06 DIAGNOSIS — R1031 Right lower quadrant pain: Secondary | ICD-10-CM | POA: Diagnosis not present

## 2016-02-06 DIAGNOSIS — Z87891 Personal history of nicotine dependence: Secondary | ICD-10-CM | POA: Insufficient documentation

## 2016-02-06 LAB — COMPREHENSIVE METABOLIC PANEL
ALK PHOS: 97 U/L (ref 38–126)
ALT: 13 U/L — AB (ref 14–54)
AST: 14 U/L — ABNORMAL LOW (ref 15–41)
Albumin: 4.2 g/dL (ref 3.5–5.0)
Anion gap: 6 (ref 5–15)
BILIRUBIN TOTAL: 0.3 mg/dL (ref 0.3–1.2)
BUN: 8 mg/dL (ref 6–20)
CALCIUM: 9.7 mg/dL (ref 8.9–10.3)
CO2: 26 mmol/L (ref 22–32)
CREATININE: 0.68 mg/dL (ref 0.44–1.00)
Chloride: 107 mmol/L (ref 101–111)
Glucose, Bld: 95 mg/dL (ref 65–99)
Potassium: 3.7 mmol/L (ref 3.5–5.1)
Sodium: 139 mmol/L (ref 135–145)
Total Protein: 7.2 g/dL (ref 6.5–8.1)

## 2016-02-06 LAB — URINALYSIS, ROUTINE W REFLEX MICROSCOPIC
Bilirubin Urine: NEGATIVE
Glucose, UA: NEGATIVE mg/dL
HGB URINE DIPSTICK: NEGATIVE
Ketones, ur: NEGATIVE mg/dL
Nitrite: NEGATIVE
PROTEIN: NEGATIVE mg/dL
Specific Gravity, Urine: 1.037 — ABNORMAL HIGH (ref 1.005–1.030)
pH: 7 (ref 5.0–8.0)

## 2016-02-06 LAB — URINE MICROSCOPIC-ADD ON
Bacteria, UA: NONE SEEN
RBC / HPF: NONE SEEN RBC/hpf (ref 0–5)

## 2016-02-06 LAB — WET PREP, GENITAL
CLUE CELLS WET PREP: NONE SEEN
SPERM: NONE SEEN
Trich, Wet Prep: NONE SEEN
Yeast Wet Prep HPF POC: NONE SEEN

## 2016-02-06 LAB — LIPASE, BLOOD: Lipase: 16 U/L (ref 11–51)

## 2016-02-06 LAB — CBC
HCT: 37.6 % (ref 36.0–46.0)
Hemoglobin: 12.1 g/dL (ref 12.0–15.0)
MCH: 27.7 pg (ref 26.0–34.0)
MCHC: 32.2 g/dL (ref 30.0–36.0)
MCV: 86 fL (ref 78.0–100.0)
PLATELETS: 322 10*3/uL (ref 150–400)
RBC: 4.37 MIL/uL (ref 3.87–5.11)
RDW: 13 % (ref 11.5–15.5)
WBC: 9.1 10*3/uL (ref 4.0–10.5)

## 2016-02-06 LAB — POC URINE PREG, ED: PREG TEST UR: NEGATIVE

## 2016-02-06 MED ORDER — DOXYCYCLINE HYCLATE 100 MG PO CAPS: 100.0000 mg | ORAL_CAPSULE | Freq: Two times a day (BID) | ORAL | Status: AC

## 2016-02-06 MED ORDER — METRONIDAZOLE 500 MG PO TABS: 500.0000 mg | ORAL_TABLET | Freq: Two times a day (BID) | ORAL | Status: AC

## 2016-02-06 MED ORDER — KETOROLAC TROMETHAMINE 30 MG/ML IJ SOLN
30.0000 mg | Freq: Once | INTRAMUSCULAR | Status: DC
  Filled 2016-02-06: qty 1

## 2016-02-06 MED ORDER — CEFTRIAXONE SODIUM 250 MG IJ SOLR
250.0000 mg | Freq: Once | INTRAMUSCULAR | Status: AC
  Administered 2016-02-06: 250 mg via INTRAMUSCULAR
  Filled 2016-02-06: qty 250

## 2016-02-06 MED ORDER — AZITHROMYCIN 250 MG PO TABS: 1000.0000 mg | ORAL_TABLET | Freq: Once | ORAL | Status: DC

## 2016-02-06 MED ORDER — ONDANSETRON 4 MG PO TBDP: 4.0000 mg | ORAL_TABLET | Freq: Three times a day (TID) | ORAL | Status: DC | PRN

## 2016-02-06 MED ORDER — HYDROCODONE-ACETAMINOPHEN 5-325 MG PO TABS: 2.0000 | ORAL_TABLET | ORAL | Status: DC | PRN

## 2016-02-06 MED ORDER — LIDOCAINE HCL (PF) 1 % IJ SOLN
INTRAMUSCULAR | Status: AC
  Administered 2016-02-06: 5 mL
  Filled 2016-02-06: qty 5

## 2016-02-06 MED ORDER — KETOROLAC TROMETHAMINE 60 MG/2ML IM SOLN
30.0000 mg | Freq: Once | INTRAMUSCULAR | Status: AC
  Administered 2016-02-06: 30 mg via INTRAMUSCULAR

## 2016-02-06 NOTE — ED Notes (Signed)
Pt. Stated, I've had abdominal pain with cramping with N/V for 2 weeks.

## 2016-02-06 NOTE — ED Notes (Signed)
Pt called out stating she needs to leave immediately because her children;s father said she needs to come pick them up now.  PA and RN at bedside speaking with patient at this time.

## 2016-02-06 NOTE — ED Notes (Signed)
Patient able to ambulate independently  

## 2016-02-06 NOTE — ED Provider Notes (Signed)
Patient is a 20 year old female came to the ER for evaluation of abdominal pain, vomiting, nausea and dizziness, primary evaluation was done by Audry Pili, PA-C, ER course was significant for right adnexal tenderness with vaginal discharge on pelvic exam.  Wet prep had many wbc's, patient had no leukocytosis, other labs unremarkable and UA pertinent for small leukocytes, negative nitrite and no bacteria.  Pelvic ultrasound was negative. Given the focal right lower quadrant tenderness with positive Ros[place] on exam, CT abd/pelvis was ordered to r/o acute appendicitis.  Ct pending at the time of shift change.  9:13 PM Patient reported to primary nurse that her children were stuck in childcare 9 PM and the father notified her she needed to pick him up and she needed to leave to get her children.  We discussed the nature and purpose, risks and benefits, as well as, the alternatives of treatment.  Pt will be treated for PID, but understand that evaluation for RLQ tenderness and possible GI illness, including but not limited to acute appendicitis were not able to be ruled out.  Time was given to allow the opportunity to ask questions and consider their options, and after the discussion, the patient decided to leave prior to obtaining CT. The patient was informed that refusal could lead to, but was not limited to, death, permanent disability, or severe pain.  Prior to refusing, I determined that the patient had the capacity to make their decision and understood the consequences of that decision. After refusal, I made every reasonable opportunity to treat them to the best of my ability.  The patient was notified that they may return to the emergency department at any time for further treatment, return precautions were reviewed and pt verbalized understanding.         Danelle Berry, PA-C 02/06/16 2116  Jerelyn Scott, MD 02/06/16 2121

## 2016-02-06 NOTE — Discharge Instructions (Signed)
Abdominal Pain, Adult °Many things can cause abdominal pain. Usually, abdominal pain is not caused by a disease and will improve without treatment. It can often be observed and treated at home. Your health care provider will do a physical exam and possibly order blood tests and X-rays to help determine the seriousness of your pain. However, in many cases, more time must pass before a clear cause of the pain can be found. Before that point, your health care provider may not know if you need more testing or further treatment. °HOME CARE INSTRUCTIONS °Monitor your abdominal pain for any changes. The following actions may help to alleviate any discomfort you are experiencing: °· Only take over-the-counter or prescription medicines as directed by your health care provider. °· Do not take laxatives unless directed to do so by your health care provider. °· Try a clear liquid diet (broth, tea, or water) as directed by your health care provider. Slowly move to a bland diet as tolerated. °SEEK MEDICAL CARE IF: °· You have unexplained abdominal pain. °· You have abdominal pain associated with nausea or diarrhea. °· You have pain when you urinate or have a bowel movement. °· You experience abdominal pain that wakes you in the night. °· You have abdominal pain that is worsened or improved by eating food. °· You have abdominal pain that is worsened with eating fatty foods. °· You have a fever. °SEEK IMMEDIATE MEDICAL CARE IF: °· Your pain does not go away within 2 hours. °· You keep throwing up (vomiting). °· Your pain is felt only in portions of the abdomen, such as the right side or the left lower portion of the abdomen. °· You pass bloody or black tarry stools. °MAKE SURE YOU: °· Understand these instructions. °· Will watch your condition. °· Will get help right away if you are not doing well or get worse. °  °This information is not intended to replace advice given to you by your health care provider. Make sure you discuss  any questions you have with your health care provider. °  °Document Released: 04/13/2005 Document Revised: 03/25/2015 Document Reviewed: 03/13/2013 °Elsevier Interactive Patient Education ©2016 [employer] ° °Pelvic Inflammatory Disease °Pelvic inflammatory disease (PID) refers to an infection in some or all of the female organs. The infection can be in the uterus, ovaries, fallopian tubes, or the surrounding tissues in the pelvis. PID can cause abdominal or pelvic pain that comes on suddenly (acute pelvic pain). PID is a serious infection because it can lead to lasting (chronic) pelvic pain or the inability to have children (infertility). °CAUSES °This condition is most often caused by an infection that is spread during sexual contact. However, the infection can also be caused by the normal bacteria that are found in the vaginal tissues if these bacteria travel upward into the reproductive organs. PID can also occur following: °· The birth of a baby. °· A miscarriage. °· An abortion. °· Major pelvic surgery. °· The use of an intrauterine device (IUD). °· A sexual assault. °RISK FACTORS °This condition is more likely to develop in women who: °· Are younger than 21 years of age. °· Are sexually active at a young age. °· Use nonbarrier contraception. °· Have multiple sexual partners. °· Have sex with someone who has symptoms of an STD (sexually transmitted disease). °· Use oral contraception. °At times, certain behaviors can also increase the possibility of getting PID, such as: °· Using a vaginal douche. °· Having an IUD in place. °SYMPTOMS °Symptoms   of this condition include: °· Abdominal or pelvic pain. °· Fever. °· Chills. °· Abnormal vaginal discharge. °· Abnormal uterine bleeding. °· Unusual pain shortly after the end of a menstrual period. °· Painful urination. °· Pain with sexual intercourse. °· Nausea and vomiting. °DIAGNOSIS °To diagnose this condition, your health care provider will do a physical exam and  take your medical history. A pelvic exam typically reveals great tenderness in the uterus and the surrounding pelvic tissues. You may also have tests, such as: °· Lab tests, including a pregnancy test, blood tests, and urine test. °· Culture tests of the vagina and cervix to check for an STD. °· Ultrasound. °· A laparoscopic procedure to look inside the pelvis. °· Examining vaginal secretions under a microscope. °TREATMENT °Treatment for this condition may involve one or more approaches. °· Antibiotic medicines may be prescribed to be taken by mouth. °· Sexual partners may need to be treated if the infection is caused by an STD. °· For more severe cases, hospitalization may be needed to give antibiotics directly into a vein through an IV tube. °· Surgery may be needed if other treatments do not help, but this is rare. °It may take weeks until you are completely well. If you are diagnosed with PID, you should also be checked for human immunodeficiency virus (HIV). Your health care provider may test you for infection again 3 months after treatment. You should not have unprotected sex. °HOME CARE INSTRUCTIONS °· Take over-the-counter and prescription medicines only as told by your health care provider. °· If you were prescribed an antibiotic medicine, take it as told by your health care provider. Do not stop taking the antibiotic even if you start to feel better. °· Do not have sexual intercourse until treatment is completed or as told by your health care provider. If PID is confirmed, your recent sexual partners will need treatment, especially if you had unprotected sex. °· Keep all follow-up visits as told by your health care provider. This is important. °SEEK MEDICAL CARE IF: °· You have increased or abnormal vaginal discharge. °· Your pain does not improve. °· You vomit. °· You have a fever. °· You cannot tolerate your medicines. °· Your partner has an STD. °· You have pain when you urinate. °SEEK IMMEDIATE MEDICAL  CARE IF: °· You have increased abdominal or pelvic pain. °· You have chills. °· Your symptoms are not better in 72 hours even with treatment. °  °This information is not intended to replace advice given to you by your health care provider. Make sure you discuss any questions you have with your health care provider. °  °Document Released: 07/04/2005 Document Revised: 03/25/2015 Document Reviewed: 08/11/2014 °Elsevier Interactive Patient Education ©2016 [employer] ° °

## 2016-02-06 NOTE — ED Provider Notes (Signed)
CSN: [id number]     Arrival date & time 02/06/16  1408 History   First MD Initiated Contact with Patient 02/06/16 1800     Chief Complaint  Patient presents with  . Abdominal Cramping  . Emesis  . Nausea  . Dizziness   (Consider location/radiation/quality/duration/timing/severity/associated sxs/prior Treatment) HPI 20 y.o. female  presents to the Emergency Department today complaining of lower abdominal pain with onset this AM while sitting. States feels like a sharp sensation that is mostly in the RLQ. Notes pelvic discomfort as well. States pain is 4/10 and is worse with ambulation. Pain minimal at rest. Notes N/V for the past 2 weeks as well. No fevers. No CP/SOB. No diaphoresis. No diarrhea. Seen at the [facility] on 01-23-16 for abdominal cramping and was related to menstrual cramping. Dx yeast infection. Pt states she is currently taking Fluconazole. Pt endorses some mild vaginal spotting. Notes vaginal discharge as well. No other symptoms noted.   Past Medical History  Diagnosis Date  . Gonorrhea April 2015   Past Surgical History  Procedure Laterality Date  . Rhytidectomy neck / cheek / chin    . Hand surgery      Left  hand  . Hand surgery      left   . Chin surgery     Family History  Problem Relation Age of Onset  . Miscarriages / [place] Mother   . Miscarriages / Stillbirths Sister   . Miscarriages / Stillbirths Maternal Grandmother   . Alcohol abuse Neg Hx   . Arthritis Neg Hx   . Asthma Neg Hx   . COPD Neg Hx   . Cancer Neg Hx   . Birth defects Neg Hx   . Depression Neg Hx   . Diabetes Neg Hx   . Drug abuse Neg Hx   . Early death Neg Hx   . Hearing loss Neg Hx   . Heart disease Neg Hx   . Hyperlipidemia Neg Hx   . Hypertension Neg Hx   . Kidney disease Neg Hx   . Learning disabilities Neg Hx   . Mental illness Neg Hx   . Mental retardation Neg Hx   . Stroke Neg Hx   . Vision loss Neg Hx   . Varicose Veins Neg Hx   . Migraines Neg Hx    Social History   Substance Use Topics  . Smoking status: Former Smoker    Types: Cigars    Quit date: 08/04/2013  . Smokeless tobacco: Never Used  . Alcohol Use: No   OB History    Gravida Para Term Preterm AB TAB SAB Ectopic Multiple Living   0 1 0 1 0 0 2     Review of Systems ROS reviewed and all are negative for acute change except as noted in the HPI.  Allergies  Review of patient's allergies indicates no known allergies.  Home Medications   Prior to Admission medications   Medication Sig Start Date End Date Taking? Authorizing Provider  etonogestrel-ethinyl estradiol (NUVARING) 0.12-0.015 MG/24HR vaginal ring Insert vaginally and leave in place for 3 consecutive weeks, then remove for 1 week. 10/23/15   Rachelle A Denney, CNM  ibuprofen (ADVIL,MOTRIN) 800 MG tablet Take 1 tablet (800 mg total) by mouth every 8 (eight) hours as needed. 12/08/15   Rachelle A Denney, CNM  prochlorperazine (COMPAZINE) 10 MG tablet Take 1 tablet (10 mg total) by mouth every 6 (six) hours as needed for nausea or vomiting. 01/22/16  Rachelle A Denney, CNM   BP 100/59 mmHg  Pulse 73  Temp(Src) 98.4 F (36.9 C) (Oral)  Resp 17  Ht 5' (1.524 m)  Wt 85.73 kg  BMI 36.91 kg/m2  SpO2 98%  LMP 01/24/2016   Physical Exam  Constitutional: She is oriented to person, place, and time. She appears well-developed and well-nourished.  Pt in NAD  HENT:  Head: Normocephalic and atraumatic.  Eyes: EOM are normal. Pupils are equal, round, and reactive to light.  Neck: Normal range of motion. Neck supple. No tracheal deviation present.  Cardiovascular: Normal rate, regular rhythm, normal heart sounds and intact distal pulses.   No murmur heard. Pulmonary/Chest: Effort normal and breath sounds normal. No respiratory distress. She has no wheezes. She has no rales. She exhibits no tenderness.  Abdominal: Soft. Normal appearance. There is tenderness. There is rebound, guarding and tenderness at McBurney's point. There is no  rigidity, no CVA tenderness and negative Murphy's sign.  Abdomen soft. Positive Rovsing   Genitourinary: Uterus normal. Pelvic exam was performed with patient prone. Cervix exhibits discharge. Cervix exhibits no motion tenderness. Right adnexum displays tenderness. Left adnexum displays no tenderness.  Exam performed by Eston Esters,  exam chaperoned Date: 02/06/2016 Pelvic exam: normal external genitalia without evidence of trauma. VULVA: normal appearing vulva with no masses, tenderness or lesion. VAGINA: normal appearing vagina with normal color and discharge, no lesions. CERVIX: normal appearing cervix without lesions, cervical motion tenderness absent, cervical os closed with out purulent discharge; vaginal discharge - white and creamy, Wet prep and DNA probe for chlamydia and GC obtained.   ADNEXA: normal adnexa in size, TTP Right Adnexa UTERUS: uterus is normal size, shape, consistency and nontender.   Musculoskeletal: Normal range of motion.  Neurological: She is alert and oriented to person, place, and time.  Skin: Skin is warm and dry.  Psychiatric: She has a normal mood and affect. Her behavior is normal. Thought content normal.  Nursing note and vitals reviewed.  ED Course  Procedures (including critical care time) Labs Review Labs Reviewed  WET PREP, GENITAL - Abnormal; Notable for the following:    WBC, Wet Prep HPF POC MANY (*)    All other components within normal limits  COMPREHENSIVE METABOLIC PANEL - Abnormal; Notable for the following:    AST 14 (*)    ALT 13 (*)    All other components within normal limits  URINALYSIS, ROUTINE W REFLEX MICROSCOPIC (NOT AT [facility]) - Abnormal; Notable for the following:    APPearance CLOUDY (*)    Specific Gravity, Urine 1.037 (*)    Leukocytes, UA SMALL (*)    All other components within normal limits  URINE MICROSCOPIC-ADD ON - Abnormal; Notable for the following:    Squamous Epithelial / LPF 0-5 (*)    All other components  within normal limits  LIPASE, BLOOD  CBC  POC URINE PREG, ED  GC/CHLAMYDIA PROBE AMP ([place]) NOT AT [facility]   Imaging Review US Transvaginal Non-ob  02/06/2016  CLINICAL DATA:  Patient with right lower quadrant pain for 1 day. Nausea and vomiting. EXAM: TRANSABDOMINAL AND TRANSVAGINAL ULTRASOUND OF PELVIS TECHNIQUE: Both transabdominal and transvaginal ultrasound examinations of the pelvis were performed. Transabdominal technique was performed for global imaging of the pelvis including uterus, ovaries, adnexal regions, and pelvic cul-de-sac. It was necessary to proceed with endovaginal exam following the transabdominal exam to visualize the adnexal structures. COMPARISON:  Pelvic ultrasound 01/27/2016. FINDINGS: Uterus Measurements: 8.9 x 4.6 x 6.0 cm.  No fibroids or other mass visualized. Endometrium Thickness: 2 mm.  No focal abnormality visualized. Right ovary Measurements: 2.5 x 1.7 x 1.8 cm. Normal appearance/no adnexal mass. Left ovary Measurements: 3.1 x 2.6 x 2.3 cm. Normal appearance/no adnexal mass. Other findings No abnormal free fluid. IMPRESSION: Normal sonographic appearance of the uterus and ovaries. Electronically Signed   By: Annia Belt M.D.   On: 02/06/2016 20:31   US Pelvis Complete  02/06/2016  CLINICAL DATA:  Patient with right lower quadrant pain for 1 day. Nausea and vomiting. EXAM: TRANSABDOMINAL AND TRANSVAGINAL ULTRASOUND OF PELVIS TECHNIQUE: Both transabdominal and transvaginal ultrasound examinations of the pelvis were performed. Transabdominal technique was performed for global imaging of the pelvis including uterus, ovaries, adnexal regions, and pelvic cul-de-sac. It was necessary to proceed with endovaginal exam following the transabdominal exam to visualize the adnexal structures. COMPARISON:  Pelvic ultrasound 01/27/2016. FINDINGS: Uterus Measurements: 8.9 x 4.6 x 6.0 cm. No fibroids or other mass visualized. Endometrium Thickness: 2 mm.  No focal abnormality  visualized. Right ovary Measurements: 2.5 x 1.7 x 1.8 cm. Normal appearance/no adnexal mass. Left ovary Measurements: 3.1 x 2.6 x 2.3 cm. Normal appearance/no adnexal mass. Other findings No abnormal free fluid. IMPRESSION: Normal sonographic appearance of the uterus and ovaries. Electronically Signed   By: Annia Belt M.D.   On: 02/06/2016 20:31   I have personally reviewed and evaluated these images and lab results as part of my medical decision-making.   EKG Interpretation None      MDM  I have reviewed and evaluated the relevant laboratory values I have reviewed and evaluated the relevant imaging studies.   I have reviewed the relevant previous healthcare records. I obtained HPI from historian. Patient discussed with supervising physician  ED Course:  Assessment: Pt is a 20yF who presents with RLQ abdominal pain as well as pelvic pain since this AM. Notes N/V x 2 weeks. Worsening today. Decrease in PO intake today. On exam, pt in NAD. Nontoxic/nonseptic appearing. VSS. Afebrile. Lungs CTA. Heart RRR. Abdomen TTP RLQ. Noted Radiation to RLQ from LLQ palpation. GU exam TTP Right adnexa. No CMT. White discharge noted. Wet prep with WBC. No yeast or BV. Treated with Rocephin/Azithro. Labs reassuring. No leukocytosis. Preg negative. UA unremarkable. [place] order with concern for Torsion, which was unremarkable. Will order CT ABD/Pelvis to evaluate for appendicitis. Suspicious on exam with RLQ pain and radiation with LLQ palpation. Given analgesia in ED. If CT unremarkable, likely PID. Given Rocephin 250 IM in ED. Will send home with Doxy 100mg  BID for 14 days.   Disposition/Plan:  8:46 PM- Sign out to Lavada Mesi, PA-C Pending CT Scan Pt acknowledges and agrees with plan  Supervising Physician Jerelyn Scott, MD   Final diagnoses:  Right adnexal tenderness     Audry Pili, PA-C 02/06/16 2046  Jerelyn Scott, MD 02/06/16 2052

## 2016-02-06 NOTE — ED Notes (Signed)
Pt transported to US

## 2016-02-08 LAB — GC/CHLAMYDIA PROBE AMP (~~LOC~~) NOT AT ARMC
CHLAMYDIA, DNA PROBE: POSITIVE — AB
Neisseria Gonorrhea: NEGATIVE

## 2016-02-09 ENCOUNTER — Telehealth (HOSPITAL_COMMUNITY): Payer: Self-pay

## 2016-02-09 NOTE — Telephone Encounter (Signed)
Spoke with pt. Verified ID. Informed of labs. Treated per protocol. [employer] form faxed. Pt informed to abstain from sexual activity x 10 days and to notify partner for testing and treatment.  

## 2016-03-17 ENCOUNTER — Other Ambulatory Visit: Payer: Self-pay

## 2016-03-17 ENCOUNTER — Ambulatory Visit: Payer: Medicaid Other | Admitting: Gastroenterology

## 2016-03-17 ENCOUNTER — Inpatient Hospital Stay (HOSPITAL_COMMUNITY)
Admission: AD | Admit: 2016-03-17 | Discharge: 2016-03-17 | Disposition: A | Discharge status: Home / Self Care | Payer: Medicaid Other | Source: Ambulatory Visit | Attending: Obstetrics and Gynecology | Admitting: Obstetrics and Gynecology

## 2016-03-17 ENCOUNTER — Encounter (HOSPITAL_COMMUNITY): Payer: Self-pay | Admitting: *Deleted

## 2016-03-17 DIAGNOSIS — M545 Low back pain, unspecified: Secondary | ICD-10-CM

## 2016-03-17 DIAGNOSIS — B9689 Other specified bacterial agents as the cause of diseases classified elsewhere: Secondary | ICD-10-CM

## 2016-03-17 DIAGNOSIS — N760 Acute vaginitis: Secondary | ICD-10-CM | POA: Insufficient documentation

## 2016-03-17 DIAGNOSIS — Z87891 Personal history of nicotine dependence: Secondary | ICD-10-CM | POA: Insufficient documentation

## 2016-03-17 DIAGNOSIS — N643 Galactorrhea not associated with childbirth: Secondary | ICD-10-CM | POA: Insufficient documentation

## 2016-03-17 DIAGNOSIS — A499 Bacterial infection, unspecified: Secondary | ICD-10-CM

## 2016-03-17 LAB — WET PREP, GENITAL
SPERM: NONE SEEN
TRICH WET PREP: NONE SEEN
YEAST WET PREP: NONE SEEN

## 2016-03-17 LAB — CBC
HEMATOCRIT: 34.2 % — AB (ref 36.0–46.0)
HEMOGLOBIN: 11.4 g/dL — AB (ref 12.0–15.0)
MCH: 27.7 pg (ref 26.0–34.0)
MCHC: 33.3 g/dL (ref 30.0–36.0)
MCV: 83.2 fL (ref 78.0–100.0)
Platelets: 293 10*3/uL (ref 150–400)
RBC: 4.11 MIL/uL (ref 3.87–5.11)
RDW: 13.6 % (ref 11.5–15.5)
WBC: 8 10*3/uL (ref 4.0–10.5)

## 2016-03-17 LAB — URINALYSIS, ROUTINE W REFLEX MICROSCOPIC
Bilirubin Urine: NEGATIVE
GLUCOSE, UA: NEGATIVE mg/dL
Ketones, ur: NEGATIVE mg/dL
Nitrite: NEGATIVE
PH: 6 (ref 5.0–8.0)
Protein, ur: NEGATIVE mg/dL
Specific Gravity, Urine: 1.025 (ref 1.005–1.030)

## 2016-03-17 LAB — URINE MICROSCOPIC-ADD ON: Bacteria, UA: NONE SEEN

## 2016-03-17 LAB — TSH: TSH: 2.229 u[IU]/mL (ref 0.350–4.500)

## 2016-03-17 LAB — POCT PREGNANCY, URINE: Preg Test, Ur: NEGATIVE

## 2016-03-17 MED ORDER — METRONIDAZOLE 500 MG PO TABS: 500.0000 mg | ORAL_TABLET | Freq: Two times a day (BID) | ORAL | 0 refills | Status: AC

## 2016-03-17 NOTE — MAU Provider Note (Signed)
Chief Complaint:  Vaginal Discharge and Nausea   First Provider Initiated Contact with Patient 03/17/16 1153      HPI: Nicole Mclean is a 20 y.o. [id number] who presents to maternity admissions reporting leaking breastmilk a week ago.  Mostly on right.  ALso has headaches, dizziness and nausea at times.  Does not want to wait two weeks to see Dr Clearance Coots.  Has discharge without odor.  Also has back pain She reports vaginal bleeding, vaginal itching/burning, urinary symptoms, h/a, dizziness, n/v, or fever/chills.    Back Pain  This is a recurrent problem. The current episode started in the past 7 days. The problem occurs intermittently. The problem has been waxing and waning since onset. The pain is present in the lumbar spine. The quality of the pain is described as aching and cramping. The pain does not radiate. The pain is moderate. The pain is the same all the time. The symptoms are aggravated by bending and twisting. Pertinent negatives include no dysuria, fever, headaches, leg pain, paresis, paresthesias, pelvic pain or weakness. She has tried nothing for the symptoms.  Vaginal Discharge  The patient's primary symptoms include vaginal discharge. The patient's pertinent negatives include no genital itching, genital lesions, genital odor, pelvic pain or vaginal bleeding. This is a new problem. The current episode started in the past 7 days. The problem occurs constantly. The problem has been unchanged. She is not pregnant. Associated symptoms include back pain. Pertinent negatives include no constipation, diarrhea, dysuria, fever, headaches, nausea or vomiting. The vaginal discharge was milky, thin and white. There has been no bleeding. She has not been passing clots. She has not been passing tissue.   RN Note: Pt stopped breastfeeding her son 7 months ago, but began leaking breastmilk a week ago.  Also has increased DC, no foul odor.  Has frequent HA's & dizziness, intermittent nausea.  Unable to get  appointment with Dr. Clearance Coots for two more weeks. Also C/O lower back pain.  Past Medical History: Past Medical History:  Diagnosis Date  . Chlamydia 01/2016  . Gonorrhea April 2015  . STD (female)     Past obstetric history: OB History  Gravida Para Term Preterm AB Living  3 2 2  0 1 2  SAB TAB Ectopic Multiple Live Births  1 0 0 0 2    # Outcome Date GA Lbr Len/2nd Weight Sex Delivery Anes PTL Lv  3 Term 07/02/15 [redacted]w[redacted]d 09:15 / 01:45 6 lb 12.5 oz (3.075 kg) M Vag-Spont EPI  LIV  2 Term 06/07/14 [redacted]w[redacted]d 15:32 / 01:19 7 lb 5.1 oz (3.32 kg) M Vag-Spont EPI  LIV  1 SAB 2014              Past Surgical History: Past Surgical History:  Procedure Laterality Date  . chin surgery    . HAND SURGERY     Left  hand  . HAND SURGERY     left   . RHYTIDECTOMY NECK / CHEEK / CHIN      Family History: Family History  Problem Relation Age of Onset  . Miscarriages / [place] Mother   . Miscarriages / Stillbirths Sister   . Miscarriages / Stillbirths Maternal Grandmother   . Alcohol abuse Neg Hx   . Arthritis Neg Hx   . Asthma Neg Hx   . COPD Neg Hx   . Cancer Neg Hx   . Birth defects Neg Hx   . Depression Neg Hx   . Diabetes Neg Hx   .  Drug abuse Neg Hx   . Early death Neg Hx   . Hearing loss Neg Hx   . Heart disease Neg Hx   . Hyperlipidemia Neg Hx   . Hypertension Neg Hx   . Kidney disease Neg Hx   . Learning disabilities Neg Hx   . Mental illness Neg Hx   . Mental retardation Neg Hx   . Stroke Neg Hx   . Vision loss Neg Hx   . Varicose Veins Neg Hx   . Migraines Neg Hx     Social History: Social History  Substance Use Topics  . Smoking status: Former Smoker    Types: Cigars    Quit date: 08/04/2013  . Smokeless tobacco: Never Used  . Alcohol use No    Allergies: No Known Allergies  Meds:  Prescriptions Prior to Admission  Medication Sig Dispense Refill Last Dose  . etonogestrel-ethinyl estradiol (NUVARING) 0.12-0.015 MG/24HR vaginal ring Insert vaginally  and leave in place for 3 consecutive weeks, then remove for 1 week. 1 each 12 Past Week at Unknown time  . HYDROcodone-acetaminophen (NORCO/VICODIN) 5-325 MG tablet Take 2 tablets by mouth every 4 (four) hours as needed. 6 tablet 0   . ibuprofen (ADVIL,MOTRIN) 800 MG tablet Take 1 tablet (800 mg total) by mouth every 8 (eight) hours as needed. 60 tablet 1 Past Month at Unknown time  . ondansetron (ZOFRAN ODT) 4 MG disintegrating tablet Take 1 tablet (4 mg total) by mouth every 8 (eight) hours as needed for nausea or vomiting. 30 tablet 1   . prochlorperazine (COMPAZINE) 10 MG tablet Take 1 tablet (10 mg total) by mouth every 6 (six) hours as needed for nausea or vomiting. 30 tablet 0     I have reviewed patient's Past Medical Hx, Surgical Hx, Family Hx, Social Hx, medications and allergies.  ROS:  Review of Systems  Constitutional: Negative for fever.  Gastrointestinal: Negative for constipation, diarrhea, nausea and vomiting.  Genitourinary: Positive for vaginal discharge. Negative for dysuria and pelvic pain.  Musculoskeletal: Positive for back pain.  Neurological: Negative for weakness, headaches and paresthesias.   Other systems negative     Physical Exam  Patient Vitals for the past 24 hrs:  BP Temp Temp src Pulse Resp  03/17/16 1143 104/65 98.7 F (37.1 C) Oral 82 18   Constitutional: Well-developed, well-nourished female in no acute distress.  Cardiovascular: normal rate and rhythm, no ectopy audible, S1 & S2 heard, no murmur Respiratory: normal effort, no distress. Lungs CTAB with no wheezes or crackles    Bilateral breasts normal, no mass GI: Abd soft, non-tender.  Nondistended.  No rebound, No guarding.  Bowel Sounds audible  MS: Extremities nontender, no edema, normal ROM Neurologic: Alert and oriented x 4.   Grossly nonfocal. GU: Neg CVAT. Skin:  Warm and Dry Psych:  Affect appropriate.  PELVIC EXAM: Cervix pink, visually closed, without lesion, scant white creamy  discharge, vaginal walls and external genitalia normal Bimanual exam: Cervix firm, anterior, neg CMT, uterus nontender, nonenlarged, adnexa without tenderness, enlargement, or mass    Labs: Results for orders placed or performed during the hospital encounter of 03/17/16 (from the past 72 hour(s))  GC/Chlamydia probe amp ([place])[facility]     Status: None   Collection Time: 03/17/16 12:00 AM  Result Value Ref Range   Chlamydia Negative     Comment: Normal Reference Range - Negative   Neisseria gonorrhea Negative     Comment: Normal Reference Range - Negative  Urinalysis, Routine w reflex microscopic (not at [facility])     Status: Abnormal   Collection Time: 03/17/16 11:47 AM  Result Value Ref Range   Color, Urine YELLOW YELLOW   APPearance CLEAR CLEAR   Specific Gravity, Urine 1.025 1.005 - 1.030   pH 6.0 5.0 - 8.0   Glucose, UA NEGATIVE NEGATIVE mg/dL   Hgb urine dipstick TRACE (A) NEGATIVE   Bilirubin Urine NEGATIVE NEGATIVE   Ketones, ur NEGATIVE NEGATIVE mg/dL   Protein, ur NEGATIVE NEGATIVE mg/dL   Nitrite NEGATIVE NEGATIVE   Leukocytes, UA SMALL (A) NEGATIVE  Urine microscopic-add on     Status: Abnormal   Collection Time: 03/17/16 11:47 AM  Result Value Ref Range   Squamous Epithelial / LPF 0-5 (A) NONE SEEN   WBC, UA 0-5 0 - 5 WBC/hpf   RBC / HPF 0-5 0 - 5 RBC/hpf   Bacteria, UA NONE SEEN NONE SEEN  Pregnancy, urine POC     Status: None   Collection Time: 03/17/16 12:03 PM  Result Value Ref Range   Preg Test, Ur NEGATIVE NEGATIVE    Comment:        THE SENSITIVITY OF THIS METHODOLOGY IS >24 mIU/mL   Wet prep, genital     Status: Abnormal   Collection Time: 03/17/16 12:11 PM  Result Value Ref Range   Yeast Wet Prep HPF POC NONE SEEN NONE SEEN   Trich, Wet Prep NONE SEEN NONE SEEN   Clue Cells Wet Prep HPF POC PRESENT (A) NONE SEEN   WBC, Wet Prep HPF POC MANY (A) NONE SEEN   Sperm NONE SEEN   CBC     Status: Abnormal   Collection Time: 03/17/16 12:20 PM   Result Value Ref Range   WBC 8.0 4.0 - 10.5 K/uL   RBC 4.11 3.87 - 5.11 MIL/uL   Hemoglobin 11.4 (L) 12.0 - 15.0 g/dL   HCT 16.1 (L) 09.6 - 04.5 %   MCV 83.2 78.0 - 100.0 fL   MCH 27.7 26.0 - 34.0 pg   MCHC 33.3 30.0 - 36.0 g/dL   RDW 40.9 81.1 - 91.4 %   Platelets 293 150 - 400 K/uL  Prolactin     Status: None   Collection Time: 03/17/16 12:20 PM  Result Value Ref Range   Prolactin 7.2 4.8 - 23.3 ng/mL    Comment: (NOTE) Performed At: [facility] [address] [place], [place] [id number] Mila Homer MD [id number]   HIV antibody (routine testing) (NOT for [facility])     Status: None   Collection Time: 03/17/16 12:20 PM  Result Value Ref Range   HIV Screen 4th Generation wRfx Non Reactive Non Reactive    Comment: (NOTE) Performed At: [facility] [address] [place], [place] [id number] Mila Homer MD [id number]   TSH     Status: None   Collection Time: 03/17/16 12:25 PM  Result Value Ref Range   TSH 2.229 0.350 - 4.500 uIU/mL    Comment: Performed at [facility]    Imaging:  No results found.  MAU Course/MDM: I have ordered labs as follows: See above. Added Prolactin and TSH to eval galactorrhea.  Did breast exam after blood was drawn, so as not to affect prolactin level    Will follow up with outpatient breast imaging Imaging ordered: outpatient breast imaging Results reviewed.   Pt stable at time of discharge.  Assessment: Galactorrhea, unclear cause Low back pain Bacterial Vaginosis  Plan: Discharge home  Recommend Follow up at [facility] for imaging Rx sent for Flagyl  for BV Will send Prolactin and TSH Followup with Dr Clearance Coots as scheduled for results followup   Encouraged to return here or to other Urgent Care/ED if she develops worsening of symptoms, increase in pain, fever, or other concerning symptoms.   Wynelle Bourgeois CNM, MSN Certified Nurse-Midwife 03/17/2016 11:56 AM

## 2016-03-17 NOTE — MAU Note (Addendum)
Pt stopped breastfeeding her son 7 months ago, but began leaking breastmilk a week ago.  Also has increased DC, no foul odor.  Has frequent HA's & dizziness, intermittent nausea.  Unable to get appointment with Dr. Clearance CootsHarper for two more weeks. Also C/O lower back pain.

## 2016-03-17 NOTE — Discharge Instructions (Signed)
Galactorrhea Galactorrhea is an abnormal milky discharge from the breast. The discharge may come from one or both nipples. The fluid is often white, yellow, or green. It is different from the normal milk produced in nursing mothers. Galactorrhea usually occurs in women, but it can sometimes affect men. Various things can cause galactorrhea. It is often caused by irritation of the breast, which can result from injury, stimulation during sexual activity, or clothes rubbing against the nipple. It may also be related to medicines or changes in hormone levels. In many cases, galactorrhea will go away without treatment. However, galactorrhea can also be a sign of something more serious, such as diseases of the kidney or thyroid or problems with the pituitary gland. Your health care provider may do various tests to help determine the cause. Sometimes the cause is unknown. It is important to monitor your condition to make sure that it goes away. HOME CARE INSTRUCTIONS  Watch your condition for any changes. The following actions may help to lessen any discomfort that you are feeling:  Take medicines only as directed by your health care provider.  Do not squeeze your breasts or nipples.  Avoid breast stimulation during sexual activity.   Perform a breast self-exam once a month. Doing this more often can irritate your breasts.  Avoid clothes that rub on your nipples.  Use breast pads to absorb the discharge.  Wear a breast binder or a support bra to help prevent clothes from rubbing on your nipples.  Keep all follow-up visits as directed by your health care provider. This is important. SEEK MEDICAL CARE IF:  You develop hot flashes, vaginal dryness, or a lack of sexual desire.  You stop having menstrual periods, or they are irregular or far apart.  You have headaches.  You have vision problems. SEEK IMMEDIATE MEDICAL CARE IF:  You have breast discharge that is bloody or puslike.  You have  breast pain.  You feel a lump in your breast.  You have wrinkling or dimpling on your breast.  Your breast becomes red and swollen.   This information is not intended to replace advice given to you by your health care provider. Make sure you discuss any questions you have with your health care provider.   Document Released: 08/11/2004 Document Revised: 07/25/2014 Document Reviewed: 02/04/2014 Elsevier Interactive Patient Education 2016 Elsevier Inc. Bacterial Vaginosis Bacterial vaginosis is a vaginal infection that occurs when the normal balance of bacteria in the vagina is disrupted. It results from an overgrowth of certain bacteria. This is the most common vaginal infection in women of childbearing age. Treatment is important to prevent complications, especially in pregnant women, as it can cause a premature delivery. CAUSES  Bacterial vaginosis is caused by an increase in harmful bacteria that are normally present in smaller amounts in the vagina. Several different kinds of bacteria can cause bacterial vaginosis. However, the reason that the condition develops is not fully understood. RISK FACTORS Certain activities or behaviors can put you at an increased risk of developing bacterial vaginosis, including:  Having a new sex partner or multiple sex partners.  Douching.  Using an intrauterine device (IUD) for contraception. Women do not get bacterial vaginosis from toilet seats, bedding, swimming pools, or contact with objects around them. SIGNS AND SYMPTOMS  Some women with bacterial vaginosis have no signs or symptoms. Common symptoms include:  Grey vaginal discharge.  A fishlike odor with discharge, especially after sexual intercourse.  Itching or burning of the vagina and vulva.  Burning or pain with urination. DIAGNOSIS  Your health care provider will take a medical history and examine the vagina for signs of bacterial vaginosis. A sample of vaginal fluid may be taken.  Your health care provider will look at this sample under a microscope to check for bacteria and abnormal cells. A vaginal pH test may also be done.  TREATMENT  Bacterial vaginosis may be treated with antibiotic medicines. These may be given in the form of a pill or a vaginal cream. A second round of antibiotics may be prescribed if the condition comes back after treatment. Because bacterial vaginosis increases your risk for sexually transmitted diseases, getting treated can help reduce your risk for chlamydia, gonorrhea, HIV, and herpes. HOME CARE INSTRUCTIONS   Only take over-the-counter or prescription medicines as directed by your health care provider.  If antibiotic medicine was prescribed, take it as directed. Make sure you finish it even if you start to feel better.  Tell all sexual partners that you have a vaginal infection. They should see their health care provider and be treated if they have problems, such as a mild rash or itching.  During treatment, it is important that you follow these instructions:  Avoid sexual activity or use condoms correctly.  Do not douche.  Avoid alcohol as directed by your health care provider.  Avoid breastfeeding as directed by your health care provider. SEEK MEDICAL CARE IF:   Your symptoms are not improving after 3 days of treatment.  You have increased discharge or pain.  You have a fever. MAKE SURE YOU:   Understand these instructions.  Will watch your condition.  Will get help right away if you are not doing well or get worse. FOR MORE INFORMATION  Centers for Disease Control and Prevention, Division of STD Prevention: SolutionApps.co.zawww.cdc.gov/std American Sexual Health Association (ASHA): www.ashastd.org    This information is not intended to replace advice given to you by your health care provider. Make sure you discuss any questions you have with your health care provider.   Document Released: 07/04/2005 Document Revised: 07/25/2014 Document  Reviewed: 02/13/2013 Elsevier Interactive Patient Education [employer]2016 Elsevier Inc.

## 2016-03-18 LAB — PROLACTIN: Prolactin: 7.2 ng/mL (ref 4.8–23.3)

## 2016-03-18 LAB — GC/CHLAMYDIA PROBE AMP (~~LOC~~) NOT AT ARMC
Chlamydia: NEGATIVE
Neisseria Gonorrhea: NEGATIVE

## 2016-03-18 LAB — HIV ANTIBODY (ROUTINE TESTING W REFLEX): HIV Screen 4th Generation wRfx: NONREACTIVE

## 2016-03-24 ENCOUNTER — Other Ambulatory Visit: Payer: Medicaid Other

## 2016-04-05 ENCOUNTER — Ambulatory Visit: Payer: Self-pay | Admitting: Obstetrics

## 2016-04-07 ENCOUNTER — Ambulatory Visit: Payer: Self-pay | Admitting: Obstetrics

## 2016-04-27 ENCOUNTER — Encounter (HOSPITAL_COMMUNITY): Payer: Self-pay | Admitting: *Deleted

## 2016-04-27 ENCOUNTER — Emergency Department (HOSPITAL_COMMUNITY): Payer: Medicaid Other

## 2016-04-27 ENCOUNTER — Emergency Department (HOSPITAL_COMMUNITY)
Admission: EM | Admit: 2016-04-27 | Discharge: 2016-04-27 | Disposition: A | Discharge status: Home / Self Care | Payer: Medicaid Other | Attending: Emergency Medicine | Admitting: Emergency Medicine

## 2016-04-27 DIAGNOSIS — R1084 Generalized abdominal pain: Secondary | ICD-10-CM

## 2016-04-27 DIAGNOSIS — Z87891 Personal history of nicotine dependence: Secondary | ICD-10-CM | POA: Insufficient documentation

## 2016-04-27 DIAGNOSIS — R0789 Other chest pain: Secondary | ICD-10-CM

## 2016-04-27 LAB — CBC
HCT: 38.1 % (ref 36.0–46.0)
HEMOGLOBIN: 12.4 g/dL (ref 12.0–15.0)
MCH: 28.2 pg (ref 26.0–34.0)
MCHC: 32.5 g/dL (ref 30.0–36.0)
MCV: 86.8 fL (ref 78.0–100.0)
Platelets: 336 10*3/uL (ref 150–400)
RBC: 4.39 MIL/uL (ref 3.87–5.11)
RDW: 12.9 % (ref 11.5–15.5)
WBC: 9.9 10*3/uL (ref 4.0–10.5)

## 2016-04-27 LAB — URINALYSIS, ROUTINE W REFLEX MICROSCOPIC
Bilirubin Urine: NEGATIVE
Glucose, UA: NEGATIVE mg/dL
Hgb urine dipstick: NEGATIVE
KETONES UR: NEGATIVE mg/dL
LEUKOCYTES UA: NEGATIVE
NITRITE: NEGATIVE
PH: 6.5 (ref 5.0–8.0)
Protein, ur: NEGATIVE mg/dL
SPECIFIC GRAVITY, URINE: 1.028 (ref 1.005–1.030)

## 2016-04-27 LAB — BASIC METABOLIC PANEL
Anion gap: 6 (ref 5–15)
BUN: 13 mg/dL (ref 6–20)
CO2: 29 mmol/L (ref 22–32)
Calcium: 9.8 mg/dL (ref 8.9–10.3)
Chloride: 105 mmol/L (ref 101–111)
Creatinine, Ser: 0.66 mg/dL (ref 0.44–1.00)
GFR calc Af Amer: >60 mL/min (ref >60)
GFR calc non Af Amer: >60 mL/min (ref >60)
GLUCOSE: 86 mg/dL (ref 65–99)
POTASSIUM: 4.1 mmol/L (ref 3.5–5.1)
Sodium: 140 mmol/L (ref 135–145)

## 2016-04-27 LAB — POC URINE PREG, ED: Preg Test, Ur: NEGATIVE

## 2016-04-27 LAB — I-STAT TROPONIN, ED: Troponin i, poc: 0.01 ng/mL (ref 0.00–0.08)

## 2016-04-27 MED ORDER — GI COCKTAIL ~~LOC~~: 30.0000 mL | Freq: Once | ORAL | Status: DC

## 2016-04-27 NOTE — ED Notes (Signed)
PT DISCHARGED. INSTRUCTIONS GIVEN. AAOX4. PT IN NO APPARENT DISTRESS. THE OPPORTUNITY TO ASK QUESTIONS WAS PROVIDED. 

## 2016-04-27 NOTE — ED Triage Notes (Signed)
Pt complains of mid abdominal and chest pain since yesterday. Pt states chest pain is worse when exhaling. Pt denies diarrhea, vomiting.

## 2016-04-27 NOTE — ED Provider Notes (Signed)
[facility]-EMERGENCY DEPT Provider Note   CSN: [id number] Arrival date & time: 04/27/16  1300     History   Chief Complaint Chief Complaint  Patient presents with  . Chest Pain  . Abdominal Pain    HPI Nicole Mclean is a 20 y.o. female.  20 yo F with a chief complaint of abdominal pain. The started yesterday. She did have a severe cramp and then it with resolved about a minute later. She has had multiple bouts of these usually worse upon standing. Some mild nausea with this. Will she is at work today she had one of these cramps with the like he was in her chest. This is been off and on for the past 3 or 4 hours. Patient now is beginning to feel much better. Denies any exertional symptoms denies history of MI and person in her family. She denies PE risk factors.   The history is provided by the patient.  Chest Pain   This is a new problem. The current episode started less than 1 hour ago. The problem occurs constantly. The problem has been resolved. The pain is present in the substernal region. The pain is at a severity of 9/10. The pain is moderate. The quality of the pain is described as brief and sharp. The pain does not radiate. Duration of episode(s) is 1 hour. Associated symptoms include abdominal pain and nausea. Pertinent negatives include no dizziness, no fever, no headaches, no palpitations, no shortness of breath and no vomiting. She has tried nothing for the symptoms. The treatment provided no relief. There are no known risk factors.  Abdominal Pain   Associated symptoms include nausea. Pertinent negatives include fever, diarrhea, vomiting, constipation, dysuria, headaches, arthralgias and myalgias.    Past Medical History:  Diagnosis Date  . Chlamydia 01/2016  . Gonorrhea April 2015  . STD (female)     Patient Active Problem List   Diagnosis Date Noted  . Abnormal uterine bleeding (AUB) 12/08/2015  . Postpartum examination following vaginal delivery 08/12/2015  .  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 Other iron deficiency anemias 04/30/2015    Past Surgical History:  Procedure Laterality Date  . chin surgery    . HAND SURGERY     Left  hand  . HAND SURGERY     left   . RHYTIDECTOMY NECK / CHEEK / CHIN      OB History    Gravida Para Term Preterm AB Living   3 2 2  0 1 2   SAB TAB Ectopic Multiple Live Births   1 0 0 0 2       Home Medications    Prior to Admission medications   Medication Sig Start Date End Date Taking? Authorizing Provider  etonogestrel-ethinyl estradiol (NUVARING) 0.12-0.015 MG/24HR vaginal ring Insert vaginally and leave in place for 3 consecutive weeks, then remove for 1 week. Patient not taking: Reported on 04/27/2016 10/23/15   Roe Coombs, CNM  HYDROcodone-acetaminophen (NORCO/VICODIN) 5-325 MG tablet Take 2 tablets by mouth every 4 (four) hours as needed. Patient not taking: Reported on 04/27/2016 02/06/16   Danelle Berry, PA-C  ibuprofen (ADVIL,MOTRIN) 800 MG tablet Take 1 tablet (800 mg total) by mouth every 8 (eight) hours as needed. Patient not taking: Reported on 04/27/2016 12/08/15   Rachelle A Denney, CNM  ondansetron (ZOFRAN ODT) 4 MG disintegrating tablet Take 1 tablet (4 mg total) by mouth every 8 (eight) hours as needed for nausea or vomiting. Patient not taking: Reported on 04/27/2016  02/06/16   Danelle Berry, PA-C  prochlorperazine (COMPAZINE) 10 MG tablet Take 1 tablet (10 mg total) by mouth every 6 (six) hours as needed for nausea or vomiting. Patient not taking: Reported on 04/27/2016 01/22/16   Roe Coombs, CNM    Family History Family History  Problem Relation Age of Onset  . Miscarriages / [place] Mother   . Miscarriages / Stillbirths Sister   . Miscarriages / Stillbirths Maternal Grandmother   . Alcohol abuse Neg Hx   . Arthritis Neg Hx   . Asthma Neg Hx   . COPD Neg Hx   . Cancer Neg Hx   . Birth defects Neg Hx   . Depression Neg Hx   . Diabetes Neg Hx   . Drug abuse Neg Hx   .  Early death Neg Hx   . Hearing loss Neg Hx   . Heart disease Neg Hx   . Hyperlipidemia Neg Hx   . Hypertension Neg Hx   . Kidney disease Neg Hx   . Learning disabilities Neg Hx   . Mental illness Neg Hx   . Mental retardation Neg Hx   . Stroke Neg Hx   . Vision loss Neg Hx   . Varicose Veins Neg Hx   . Migraines Neg Hx     Social History Social History  Substance Use Topics  . Smoking status: Former Smoker    Types: Cigars    Quit date: 08/04/2013  . Smokeless tobacco: Never Used  . Alcohol use No     Allergies   Review of patient's allergies indicates no known allergies.   Review of Systems Review of Systems  Constitutional: Negative for chills and fever.  HENT: Negative for congestion and rhinorrhea.   Eyes: Negative for redness and visual disturbance.  Respiratory: Negative for shortness of breath and wheezing.   Cardiovascular: Positive for chest pain. Negative for palpitations.  Gastrointestinal: Positive for abdominal pain and nausea. Negative for constipation, diarrhea and vomiting.  Genitourinary: Negative for dysuria and urgency.  Musculoskeletal: Negative for arthralgias and myalgias.  Skin: Negative for pallor and wound.  Neurological: Negative for dizziness and headaches.     Physical Exam Updated Vital Signs BP 115/65 (BP Location: Left Arm)   Pulse 72   Temp 97.7 F (36.5 C) (Oral)   Resp 15   LMP 04/12/2016   SpO2 100%   Physical Exam  Constitutional: She is oriented to person, place, and time. She appears well-developed and well-nourished. No distress.  HENT:  Head: Normocephalic and atraumatic.  Eyes: EOM are normal. Pupils are equal, round, and reactive to light.  Neck: Normal range of motion. Neck supple.  Cardiovascular: Normal rate and regular rhythm.  Exam reveals no gallop and no friction rub.   No murmur heard. Pulmonary/Chest: Effort normal. She has no wheezes. She has no rales.  Abdominal: Soft. She exhibits no distension and no  mass. There is tenderness (diffuse, negative murphys). There is no guarding.  Musculoskeletal: She exhibits no edema or tenderness.  Neurological: She is alert and oriented to person, place, and time.  Skin: Skin is warm and dry. She is not diaphoretic.  Psychiatric: She has a normal mood and affect. Her behavior is normal.  Nursing note and vitals reviewed.    ED Treatments / Results  Labs (all labs ordered are listed, but only abnormal results are displayed) Labs Reviewed  BASIC METABOLIC PANEL  CBC  URINALYSIS, ROUTINE W REFLEX MICROSCOPIC (NOT AT [facility])  I-STAT TROPOININ,  ED  POC URINE PREG, ED    EKG  EKG Interpretation  Date/Time:  Wednesday April 27 2016 13:23:31 EDT Ventricular Rate:  63 PR Interval:    QRS Duration: 94 QT Interval:  415 QTC Calculation: 425 R Axis:   42 Text Interpretation:  Sinus rhythm No old tracing to compare Confirmed by BELFI  MD, MELANIE ([id number]) on 04/27/2016 3:00:16 PM       Radiology Dg Chest 2 View  Result Date: 04/27/2016 CLINICAL DATA:  Chest pain. EXAM: CHEST  2 VIEW COMPARISON:  Radiographs of Dec 12, 2011. FINDINGS: The heart size and mediastinal contours are within normal limits. Both lungs are clear. No pneumothorax or pleural effusion is noted. The visualized skeletal structures are unremarkable. IMPRESSION: No active cardiopulmonary disease. Electronically Signed   By: Lupita RaiderJames  Green Jr, M.D.   On: 04/27/2016 13:56    Procedures Procedures (including critical care time)  Medications Ordered in ED Medications  gi cocktail (Maalox,Lidocaine,Donnatal) (not administered)     Initial Impression / Assessment and Plan / ED Course  I have reviewed the triage vital signs and the nursing notes.  Pertinent labs & imaging results that were available during my care of the patient were reviewed by me and considered in my medical decision making (see chart for details).  Clinical Course    20 yo F With a chief complaint of chest  pain and abdominal pain. This is colicky in nature and sounds most likely with constipation. She has no focal abdominal tenderness. ECG cxr unremarkable, trop ordered in triage in negative, completely atypical of acs.  No focal abdominal tenderness, doubt appy, chole.   4:55 PM:  I have discussed the diagnosis/risks/treatment options with the patient and believe the pt to be eligible for discharge home to follow-up with PCP. We also discussed returning to the ED immediately if new or worsening sx occur. We discussed the sx which are most concerning (e.g., sudden worsening pain, fever, inability to tolerate by mouth) that necessitate immediate return. Medications administered to the patient during their visit and any new prescriptions provided to the patient are listed below.  Medications given during this visit Medications  gi cocktail (Maalox,Lidocaine,Donnatal) (not administered)     The patient appears reasonably screen and/or stabilized for discharge and I doubt any other medical condition or other [facility] requiring further screening, evaluation, or treatment in the ED at this time prior to discharge.    Final Clinical Impressions(s) / ED Diagnoses   Final diagnoses:  Atypical chest pain  Generalized abdominal pain    New Prescriptions New Prescriptions   No medications on file     Melene PlanDan Ebone Alcivar, DO 04/27/16 1655

## 2016-04-27 NOTE — Discharge Instructions (Signed)
TAKE 8 CAPFULS OF MIRALAX IN A 32 OUNCE GATORADE AND DRINK THE WHOLE BEVERAGE.  Or do 1 scoop in 8oz of water every day until you start having some pretty significant bowel movements.

## 2016-05-04 ENCOUNTER — Ambulatory Visit: Payer: Medicaid Other | Admitting: Obstetrics

## 2016-05-06 NOTE — Progress Notes (Signed)
Appointment cancelled. Patient not seen by physician.

## 2016-08-12 ENCOUNTER — Inpatient Hospital Stay (HOSPITAL_COMMUNITY)
Admission: AD | Admit: 2016-08-12 | Discharge: 2016-08-12 | Discharge status: Against Medical Advice | Payer: Self-pay | Source: Ambulatory Visit | Attending: Obstetrics & Gynecology | Admitting: Obstetrics & Gynecology

## 2016-08-12 DIAGNOSIS — Z5321 Procedure and treatment not carried out due to patient leaving prior to being seen by health care provider: Secondary | ICD-10-CM | POA: Insufficient documentation

## 2016-08-12 DIAGNOSIS — N3000 Acute cystitis without hematuria: Secondary | ICD-10-CM

## 2016-08-12 DIAGNOSIS — R1030 Lower abdominal pain, unspecified: Secondary | ICD-10-CM | POA: Diagnosis present

## 2016-08-12 LAB — URINALYSIS, ROUTINE W REFLEX MICROSCOPIC
Bilirubin Urine: NEGATIVE
Glucose, UA: NEGATIVE mg/dL
KETONES UR: NEGATIVE mg/dL
Nitrite: NEGATIVE
PH: 7 (ref 5.0–8.0)
Protein, ur: 30 mg/dL — AB
Specific Gravity, Urine: 1.025 (ref 1.005–1.030)

## 2016-08-12 LAB — POCT PREGNANCY, URINE: Preg Test, Ur: NEGATIVE

## 2016-08-12 NOTE — MAU Note (Signed)
Pt presents to MAU with complaints of vaginal pressure and urine frequency. Denies any vaginal bleeding or abnormal discharge

## 2016-08-12 NOTE — MAU Note (Signed)
Pt came to desk stating that she had to leave and go pick up her children

## 2016-08-13 LAB — URINE CULTURE

## 2016-08-15 DIAGNOSIS — R1030 Lower abdominal pain, unspecified: Secondary | ICD-10-CM | POA: Diagnosis present

## 2016-08-16 ENCOUNTER — Ambulatory Visit (HOSPITAL_COMMUNITY)
Admission: EM | Admit: 2016-08-16 | Discharge: 2016-08-16 | Disposition: A | Discharge status: Other Acute Inpatient Hospital | Payer: Self-pay

## 2016-08-16 ENCOUNTER — Encounter (HOSPITAL_COMMUNITY): Payer: Self-pay | Admitting: Student

## 2016-08-16 ENCOUNTER — Inpatient Hospital Stay (HOSPITAL_COMMUNITY)
Admission: AD | Admit: 2016-08-16 | Discharge: 2016-08-16 | Disposition: A | Discharge status: Home / Self Care | Payer: Self-pay | Source: Ambulatory Visit | Attending: Obstetrics and Gynecology | Admitting: Obstetrics and Gynecology

## 2016-08-16 DIAGNOSIS — R102 Pelvic and perineal pain: Secondary | ICD-10-CM | POA: Insufficient documentation

## 2016-08-16 DIAGNOSIS — R42 Dizziness and giddiness: Secondary | ICD-10-CM | POA: Insufficient documentation

## 2016-08-16 DIAGNOSIS — Z87891 Personal history of nicotine dependence: Secondary | ICD-10-CM | POA: Insufficient documentation

## 2016-08-16 DIAGNOSIS — R110 Nausea: Secondary | ICD-10-CM | POA: Insufficient documentation

## 2016-08-16 DIAGNOSIS — R350 Frequency of micturition: Secondary | ICD-10-CM | POA: Insufficient documentation

## 2016-08-16 LAB — WET PREP, GENITAL
Clue Cells Wet Prep HPF POC: NONE SEEN
Sperm: NONE SEEN
Trich, Wet Prep: NONE SEEN
YEAST WET PREP: NONE SEEN

## 2016-08-16 LAB — URINALYSIS, ROUTINE W REFLEX MICROSCOPIC
BILIRUBIN URINE: NEGATIVE
Glucose, UA: NEGATIVE mg/dL
KETONES UR: NEGATIVE mg/dL
Nitrite: NEGATIVE
PROTEIN: 30 mg/dL — AB
SPECIFIC GRAVITY, URINE: 1.017 (ref 1.005–1.030)
pH: 6 (ref 5.0–8.0)

## 2016-08-16 LAB — POCT PREGNANCY, URINE: PREG TEST UR: NEGATIVE

## 2016-08-16 MED ORDER — NITROFURANTOIN MONOHYD MACRO 100 MG PO CAPS: 100.0000 mg | ORAL_CAPSULE | Freq: Two times a day (BID) | ORAL | 0 refills | Status: AC

## 2016-08-16 NOTE — MAU Provider Note (Signed)
History     CSN: [id number]  Arrival date and time: 08/16/16 1114   None     Chief Complaint  Patient presents with  . Abdominal Pain  . Nausea  . Dizziness   Non-pregnant female here with pelvic pressure and urinary frequency x4-5 days. She denies dysuria but reports pelvic discomfort with urination. She denies hematuria, back pain, and fever. She has hx of UTI and reports similar sx. She denies new sexual partner in 5 years. She presented 4 days ago for similar sx but could not stay for evaluation.    Pertinent Gynecological History: Menses: 07/19/16 Contraception: condoms Sexually transmitted diseases: past history: GC, CT  Past Medical History:  Diagnosis Date  . Chlamydia 01/2016  . Gonorrhea April 2015  . STD (female)     Past Surgical History:  Procedure Laterality Date  . chin surgery    . HAND SURGERY     Left  hand  . RHYTIDECTOMY NECK / CHEEK / CHIN      Family History  Problem Relation Age of Onset  . Miscarriages / [place] Mother   . Miscarriages / Stillbirths Sister   . Miscarriages / Stillbirths Maternal Grandmother   . Alcohol abuse Neg Hx   . Arthritis Neg Hx   . Asthma Neg Hx   . COPD Neg Hx   . Cancer Neg Hx   . Birth defects Neg Hx   . Depression Neg Hx   . Diabetes Neg Hx   . Drug abuse Neg Hx   . Early death Neg Hx   . Hearing loss Neg Hx   . Heart disease Neg Hx   . Hyperlipidemia Neg Hx   . Hypertension Neg Hx   . Kidney disease Neg Hx   . Learning disabilities Neg Hx   . Mental illness Neg Hx   . Mental retardation Neg Hx   . Stroke Neg Hx   . Vision loss Neg Hx   . Varicose Veins Neg Hx   . Migraines Neg Hx     Social History  Substance Use Topics  . Smoking status: Former Smoker    Types: Cigars    Quit date: 08/04/2013  . Smokeless tobacco: Never Used  . Alcohol use No    Allergies: No Known Allergies  Prescriptions Prior to Admission  Medication Sig Dispense Refill Last Dose  . etonogestrel-ethinyl  estradiol (NUVARING) 0.12-0.015 MG/24HR vaginal ring Insert vaginally and leave in place for 3 consecutive weeks, then remove for 1 week. (Patient not taking: Reported on 04/27/2016) 1 each 12 Not Taking at Unknown time  . HYDROcodone-acetaminophen (NORCO/VICODIN) 5-325 MG tablet Take 2 tablets by mouth every 4 (four) hours as needed. (Patient not taking: Reported on 04/27/2016) 6 tablet 0 Completed Course at Unknown time  . ibuprofen (ADVIL,MOTRIN) 800 MG tablet Take 1 tablet (800 mg total) by mouth every 8 (eight) hours as needed. (Patient not taking: Reported on 04/27/2016) 60 tablet 1 Completed Course at Unknown time  . ondansetron (ZOFRAN ODT) 4 MG disintegrating tablet Take 1 tablet (4 mg total) by mouth every 8 (eight) hours as needed for nausea or vomiting. (Patient not taking: Reported on 04/27/2016) 30 tablet 1 Completed Course at Unknown time  . prochlorperazine (COMPAZINE) 10 MG tablet Take 1 tablet (10 mg total) by mouth every 6 (six) hours as needed for nausea or vomiting. (Patient not taking: Reported on 04/27/2016) 30 tablet 0 Not Taking at Unknown time    Review of Systems  Constitutional: Negative  for fever.  Genitourinary: Positive for frequency and pelvic pain. Negative for dysuria and vaginal discharge.  Musculoskeletal: Positive for back pain.   Physical Exam   Blood pressure 124/62, pulse 74, temperature 98.4 F (36.9 C), temperature source Oral, resp. rate 16, height 5' (1.524 m), weight 92.1 kg (203 lb), last menstrual period 07/19/2016, not currently breastfeeding.  Physical Exam  Nursing note and vitals reviewed. Constitutional: She is oriented to person, place, and time. She appears well-developed and well-nourished. No distress.  HENT:  Head: Normocephalic and atraumatic.  Neck: Normal range of motion.  Cardiovascular: Normal rate.   Respiratory: Effort normal.  Musculoskeletal: Normal range of motion.  Neurological: She is alert and oriented to person, place,  and time.  Skin: Skin is warm and dry.  Psychiatric: She has a normal mood and affect.   Results for orders placed or performed during the hospital encounter of 08/16/16 (from the past 24 hour(s))  Urinalysis, Routine w reflex microscopic     Status: Abnormal   Collection Time: 08/16/16 11:35 AM  Result Value Ref Range   Color, Urine YELLOW YELLOW   APPearance HAZY (A) CLEAR   Specific Gravity, Urine 1.017 1.005 - 1.030   pH 6.0 5.0 - 8.0   Glucose, UA NEGATIVE NEGATIVE mg/dL   Hgb urine dipstick SMALL (A) NEGATIVE   Bilirubin Urine NEGATIVE NEGATIVE   Ketones, ur NEGATIVE NEGATIVE mg/dL   Protein, ur 30 (A) NEGATIVE mg/dL   Nitrite NEGATIVE NEGATIVE   Leukocytes, UA LARGE (A) NEGATIVE   RBC / HPF 0-5 0 - 5 RBC/hpf   WBC, UA TOO NUMEROUS TO COUNT 0 - 5 WBC/hpf   Bacteria, UA RARE (A) NONE SEEN   Squamous Epithelial / LPF 0-5 (A) NONE SEEN   Mucous PRESENT   Pregnancy, urine POC     Status: None   Collection Time: 08/16/16 11:44 AM  Result Value Ref Range   Preg Test, Ur NEGATIVE NEGATIVE   MAU Course  Procedures  MDM Labs ordered and reviewed. GC/CT cultures pending. Will treat for suspected UTI. UC sent. Stable for discharge home.  Assessment and Plan   1. Urinary frequency   2. Pelvic pressure in female    Discharge home Rx Macrobid Increase water intake Continue AZO  Allergies as of 08/16/2016   No Known Allergies     Medication List    STOP taking these medications   etonogestrel-ethinyl estradiol 0.12-0.015 MG/24HR vaginal ring Commonly known as:  NUVARING   HYDROcodone-acetaminophen 5-325 MG tablet Commonly known as:  NORCO/VICODIN   ibuprofen 800 MG tablet Commonly known as:  ADVIL,MOTRIN   ondansetron 4 MG disintegrating tablet Commonly known as:  ZOFRAN ODT   prochlorperazine 10 MG tablet Commonly known as:  COMPAZINE     TAKE these medications   nitrofurantoin (macrocrystal-monohydrate) 100 MG capsule Commonly known as:  MACROBID Take 1  capsule (100 mg total) by mouth 2 (two) times daily.      Donette LarryMelanie Braylei Totino, CNM 08/16/2016, 1:48 PM

## 2016-08-16 NOTE — MAU Note (Signed)
Pressure on her bladder continues.  Going every 30-40 min, continuous urge, going normal amt, still feels pressure after.  Feels nauseated and dizzy. urine is cloudy.  Is taking AZO- not relieving pressure

## 2016-08-16 NOTE — Discharge Instructions (Signed)
Urinary Tract Infection, Adult Introduction A urinary tract infection (UTI) is an infection of any part of the urinary tract. The urinary tract includes the:  Kidneys.  Ureters.  Bladder.  Urethra. These organs make, store, and get rid of pee (urine) in the body. Follow these instructions at home:  Take over-the-counter and prescription medicines only as told by your doctor.  If you were prescribed an antibiotic medicine, take it as told by your doctor. Do not stop taking the antibiotic even if you start to feel better.  Avoid the following drinks:  Alcohol.  Caffeine.  Tea.  Carbonated drinks.  Drink enough fluid to keep your pee clear or pale yellow.  Keep all follow-up visits as told by your doctor. This is important.  Make sure to:  Empty your bladder often and completely. Do not to hold pee for long periods of time.  Empty your bladder before and after sex.  Wipe from front to back after a bowel movement if you are female. Use each tissue one time when you wipe. Contact a doctor if:  You have back pain.  You have a fever.  You feel sick to your stomach (nauseous).  You throw up (vomit).  Your symptoms do not get better after 3 days.  Your symptoms go away and then come back. Get help right away if:  You have very bad back pain.  You have very bad lower belly (abdominal) pain.  You are throwing up and cannot keep down any medicines or water. This information is not intended to replace advice given to you by your health care provider. Make sure you discuss any questions you have with your health care provider. Document Released: 12/21/2007 Document Revised: 12/10/2015 Document Reviewed: 05/25/2015  2017 Elsevier  

## 2016-08-17 LAB — GC/CHLAMYDIA PROBE AMP (~~LOC~~) NOT AT ARMC
CHLAMYDIA, DNA PROBE: NEGATIVE
Neisseria Gonorrhea: NEGATIVE

## 2016-08-18 ENCOUNTER — Telehealth: Payer: Self-pay | Admitting: Certified Nurse Midwife

## 2016-08-18 LAB — URINE CULTURE

## 2016-08-18 MED ORDER — SULFAMETHOXAZOLE-TRIMETHOPRIM 800-160 MG PO TABS: 1.0000 | ORAL_TABLET | Freq: Two times a day (BID) | ORAL | 0 refills | Status: DC

## 2016-08-18 NOTE — Telephone Encounter (Signed)
Pt notified of +UC with proteus mirabilis and need to change abx. Rx for Bactrim sent. Verbalized understanding.

## 2016-09-06 ENCOUNTER — Ambulatory Visit: Payer: Self-pay | Admitting: Obstetrics

## 2016-10-10 ENCOUNTER — Telehealth: Payer: Self-pay | Admitting: *Deleted

## 2016-10-10 NOTE — Telephone Encounter (Signed)
Patient is not sure if she needs an appointment or not- she reports egg white discharge, nausea, dizziness, fatigue- she had +/- pregnancy test. Patient reports her LMP was 09/15/2016. Told patient she needs to see if she misses this cycle- if she does she needs to come to the office for UPT confirmation. She states all of these symptoms are her normal pregnancy symptoms- they normally don't show up this early. Will see what happens within the next week and she will call back for appointment. She was advised to graze for nausea at this point.

## 2016-12-12 IMAGING — US US OB COMP LESS 14 WK
1 series · 14 of 28 positions shown · non-contrast
Comparison: None.

CLINICAL DATA: Followup Subchorionic hemorrhage.

EXAM:
OBSTETRIC <14 WK ULTRASOUND
TECHNIQUE: Transabdominal ultrasound was performed for evaluation of the
gestation as well as the maternal uterus and adnexal regions.

[Series 1: us ob transvaginal · 14 of 38 slices shown]
[im 2/38]
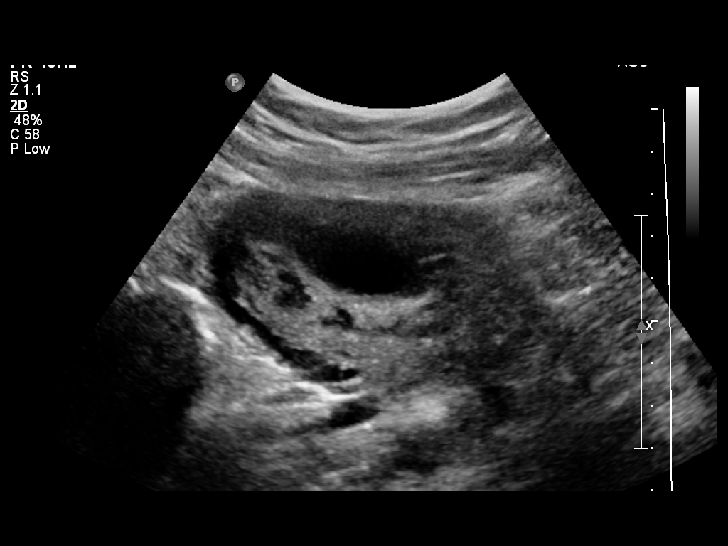
[im 5/38]
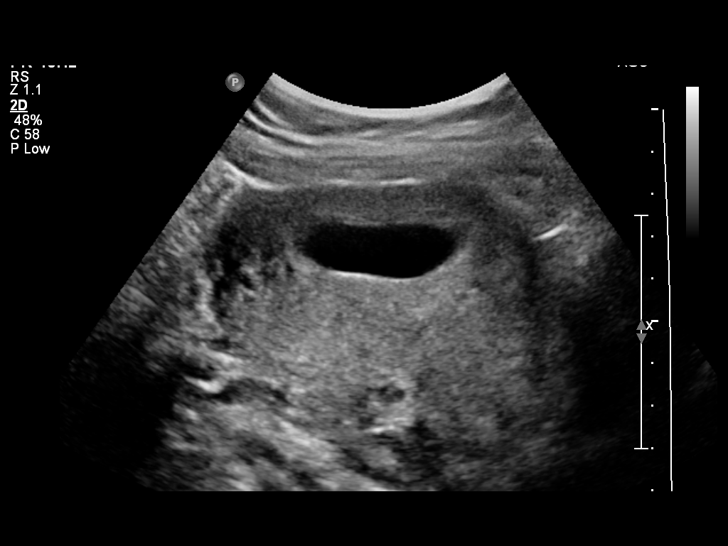
[im 7/38]
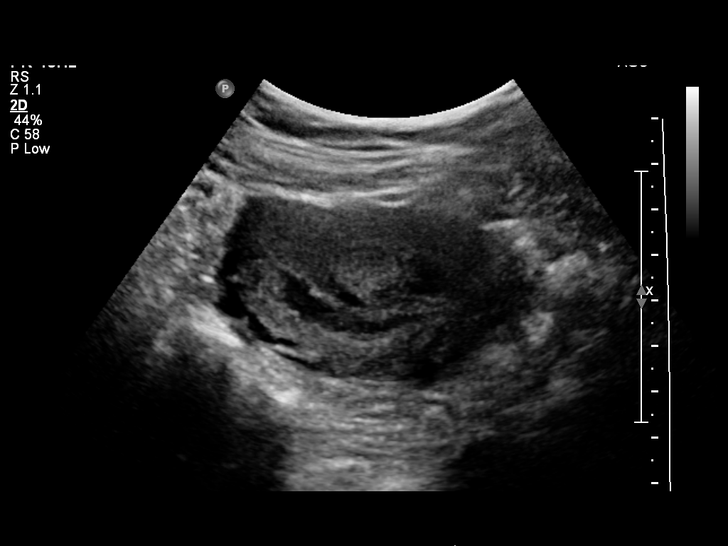
[im 10/38]
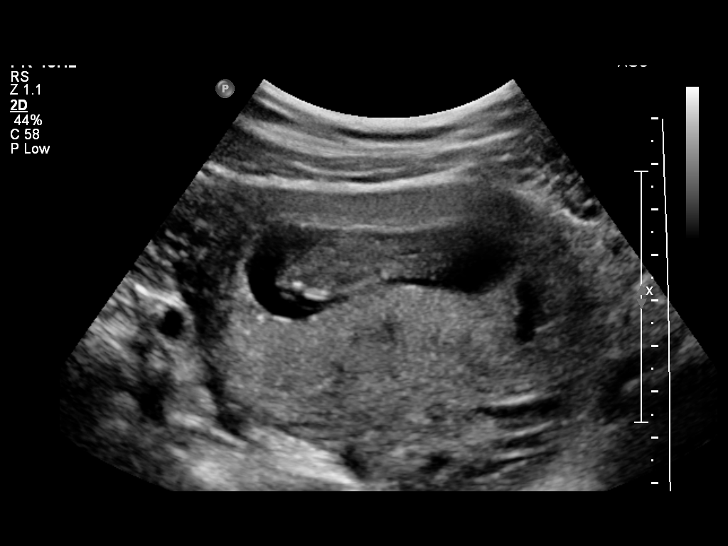
[im 13/38]
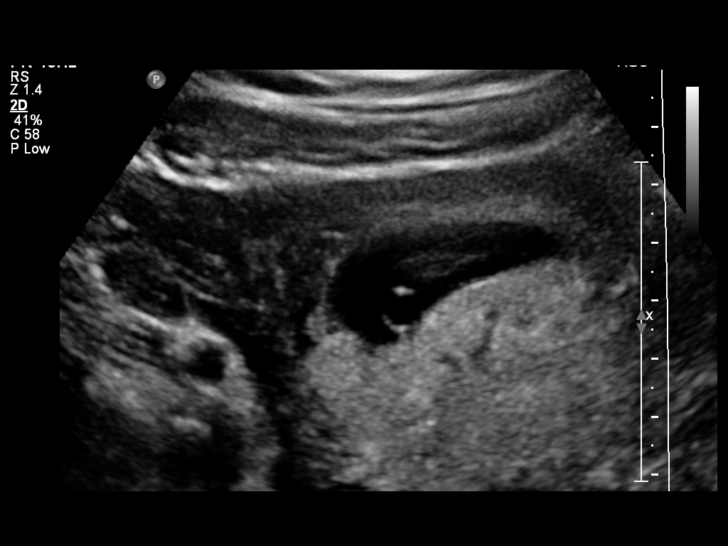
[im 16/38]
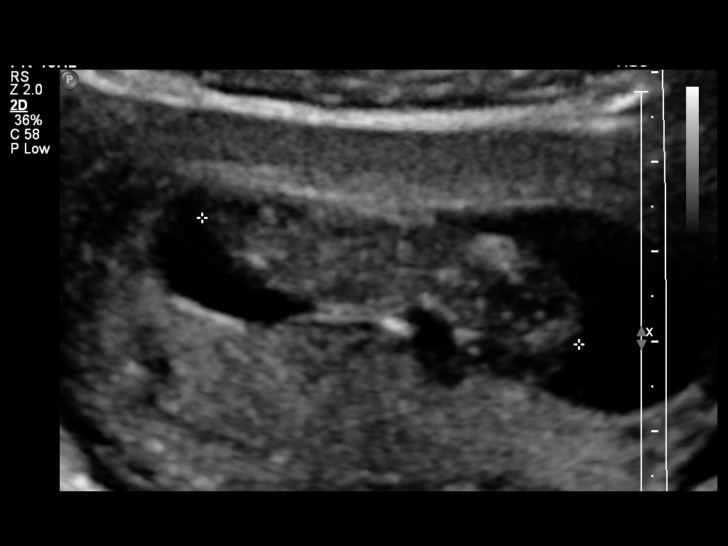
[im 18/38]
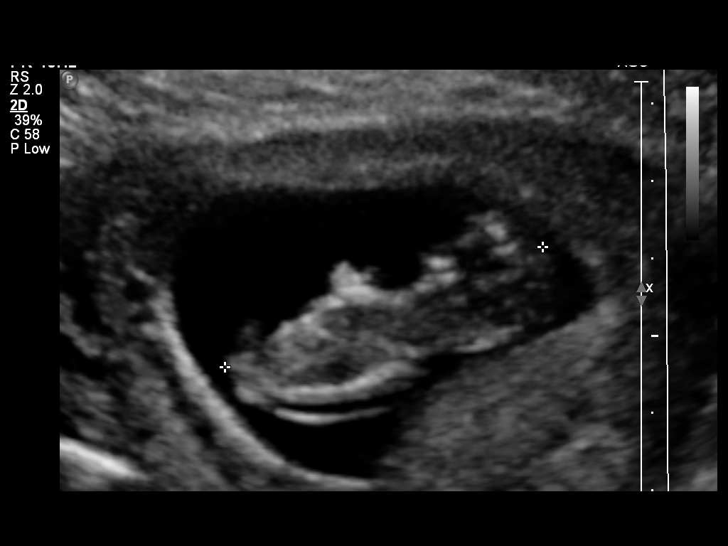
[im 21/38]
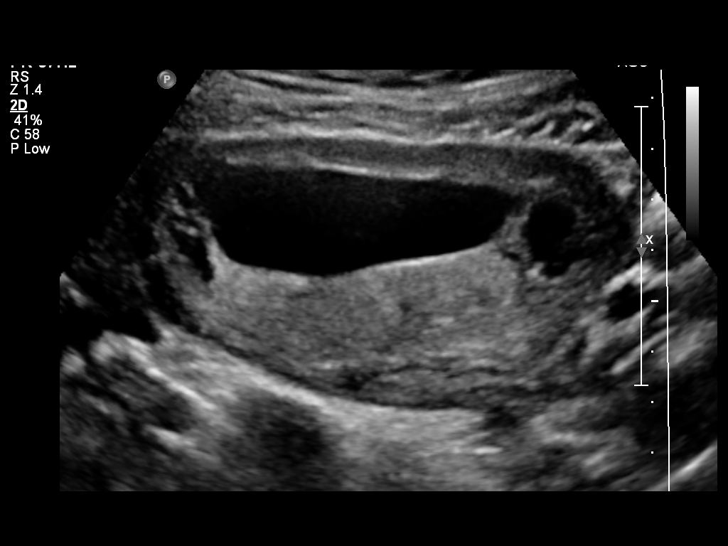
[im 24/38]
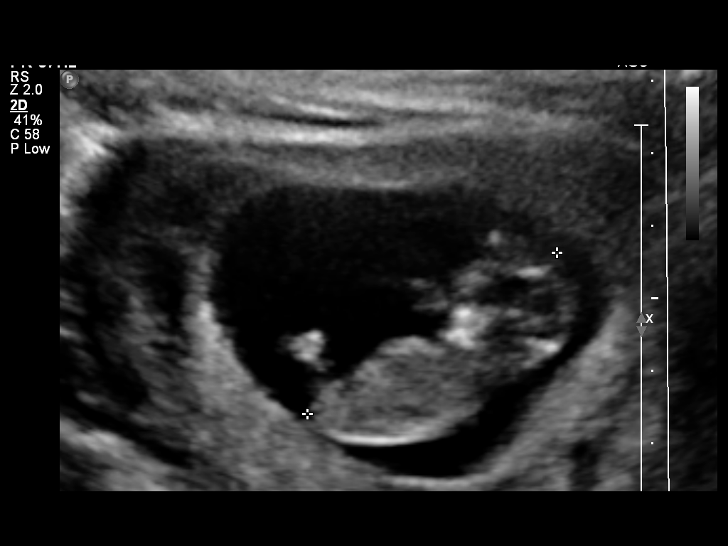
[im 27/38]
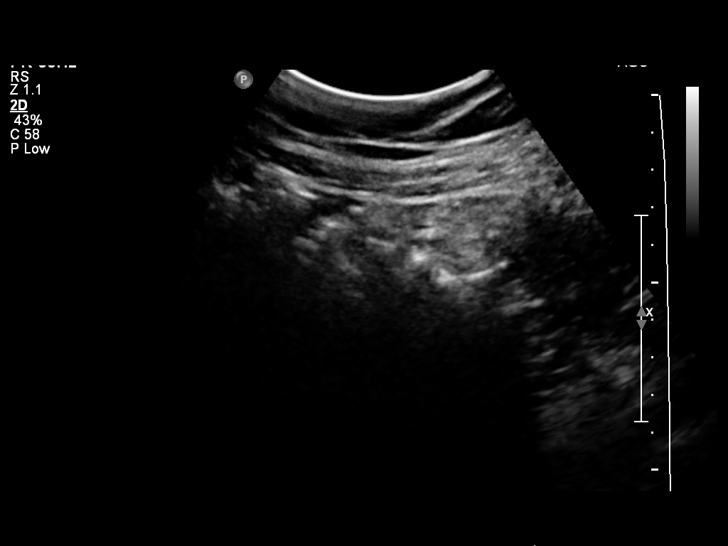
[im 29/38]
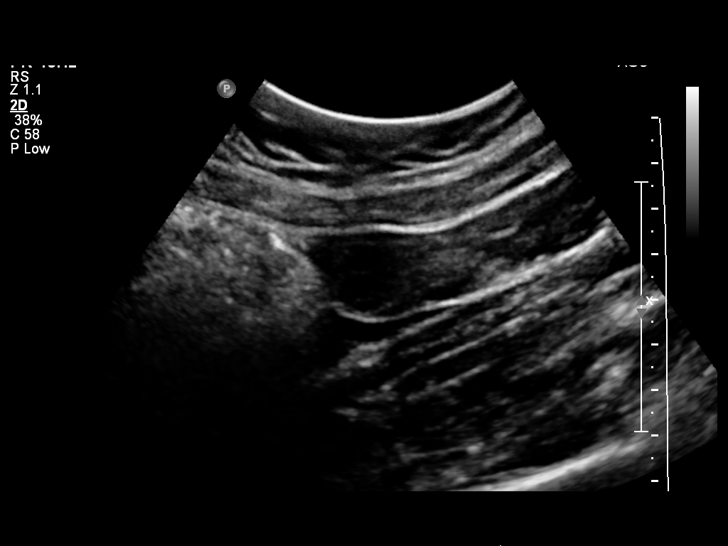
[im 32/38]
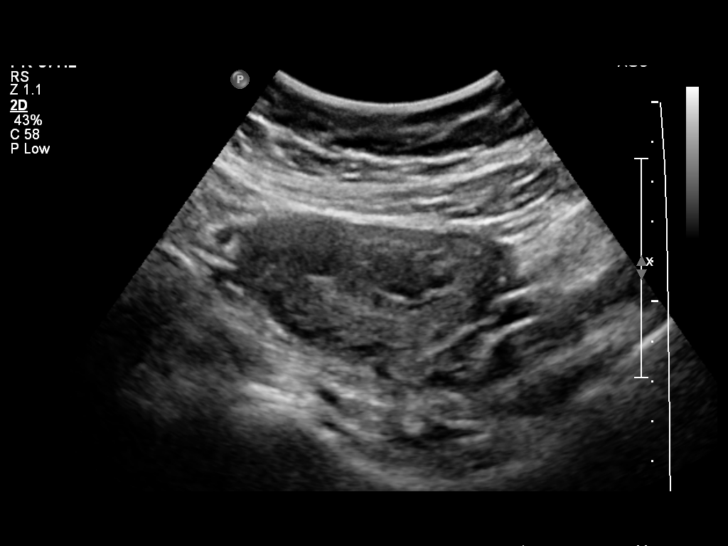
[im 35/38]
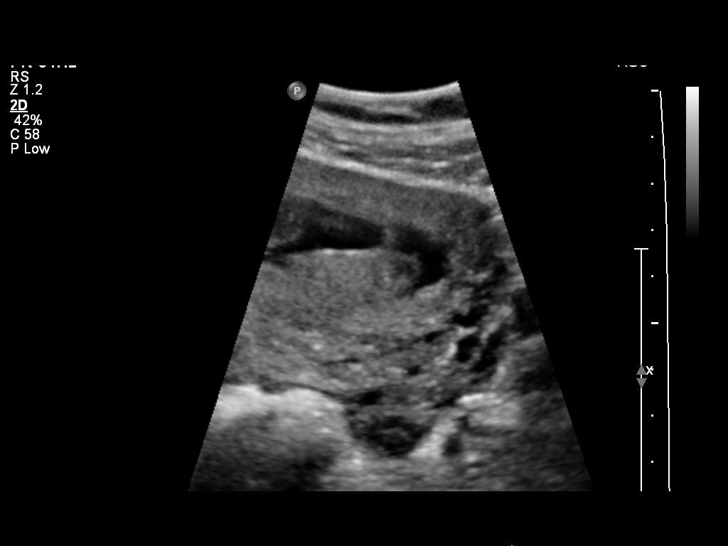
[im 38/38]
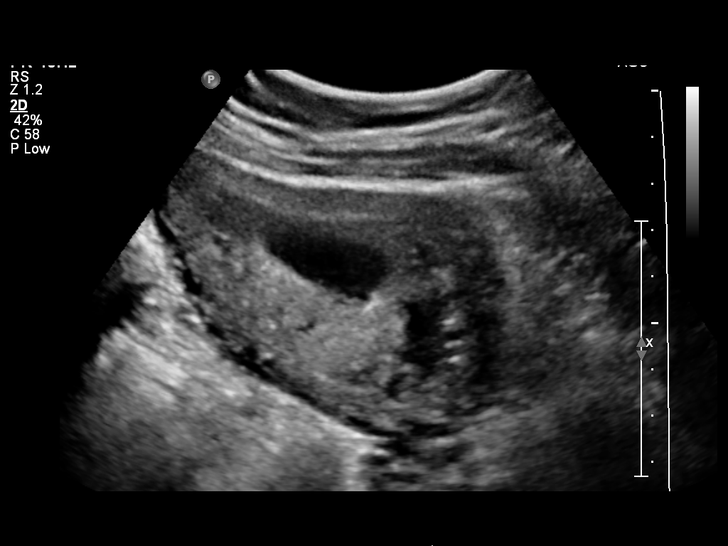

[14 of 28 positions shown; findings below may reference images not displayed]

FINDINGS: Intrauterine gestational sac: Single

Yolk sac:  Yes

Embryo:  Yes

Cardiac Activity: Yes

Heart Rate: 167 bpm

MSD:   mm    w     d

CRL:   4.23  mm   11 w 1 d                  US EDC: 07/06/2015

Maternal uterus/adnexae:

Subchorionic hemorrhage: Resolving, chronic hypoechoic subchorionic
hemorrhage noted.

Right ovary: Normal

Left ovary: Normal

Other :None

Free fluid:  None
IMPRESSION: 1. Single living intrauterine gestation. The estimated gestational
age is 11 weeks and 1 day.
2. Resolving, chronic small subchorionic hemorrhage noted. Exam is
otherwise unremarkable.

## 2017-02-05 IMAGING — US US OB COMP +14 WK
2 series · 12 of 28 positions shown · non-contrast
Comparison: none

[Series 1: us ob +14 all · 1 of 4 slices shown (1 of 2)]
[im 4/4]
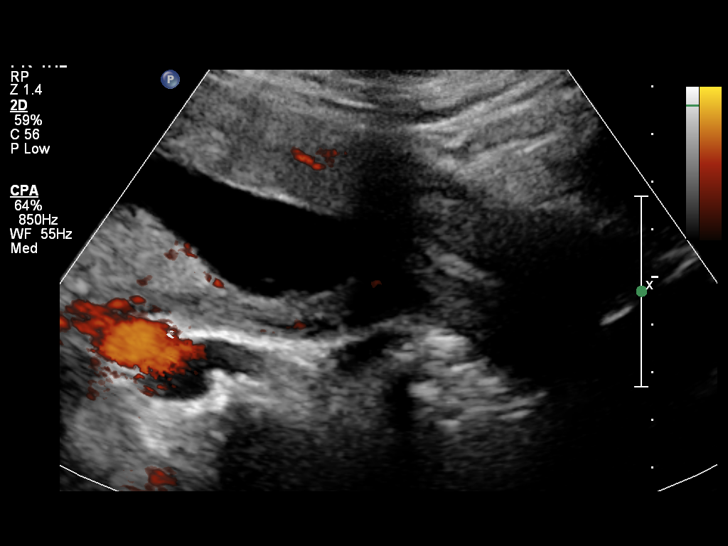

[Series 1: us ob +14 all · 11 of 70 slices shown (2 of 2)]
[im 3/70]
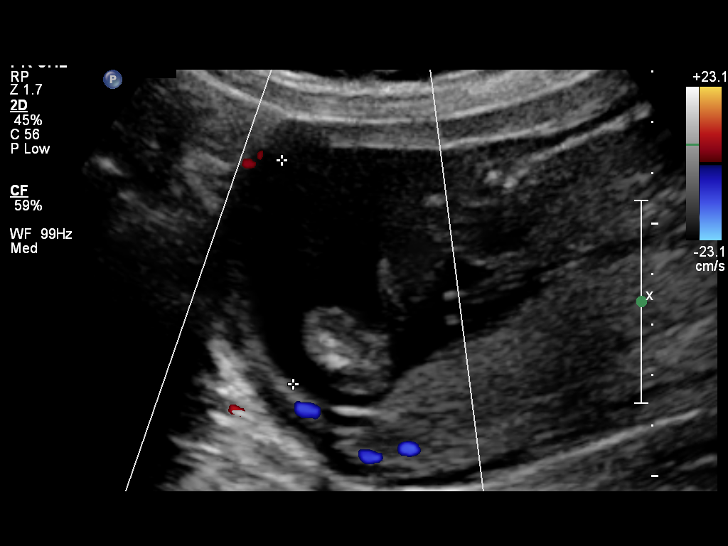
[im 9/70]
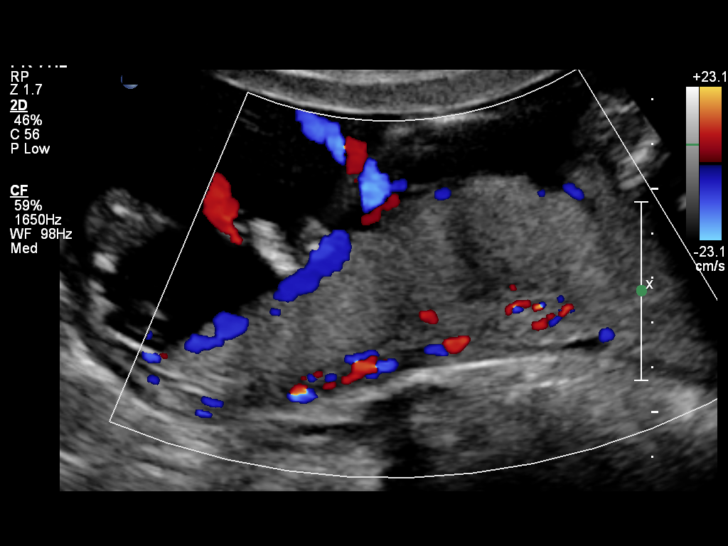
[im 17/70]
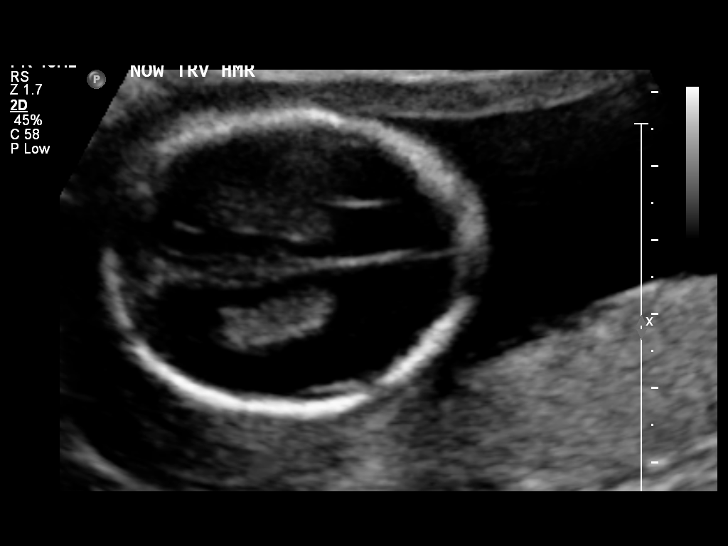
[im 23/70]
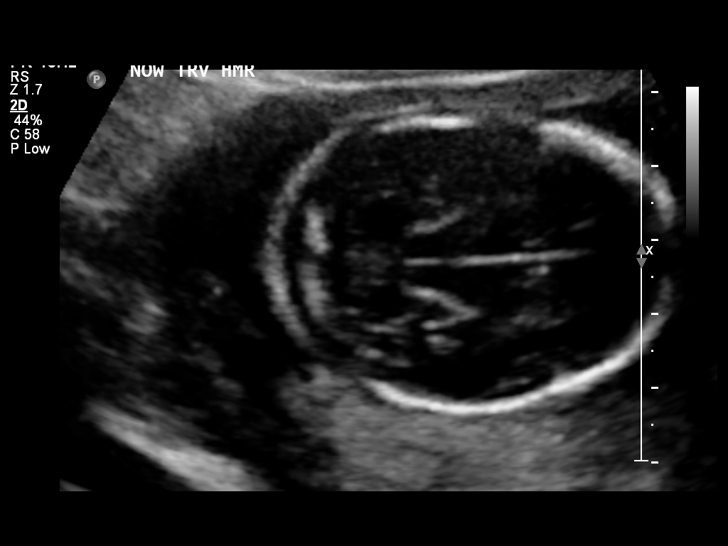
[im 28/70]
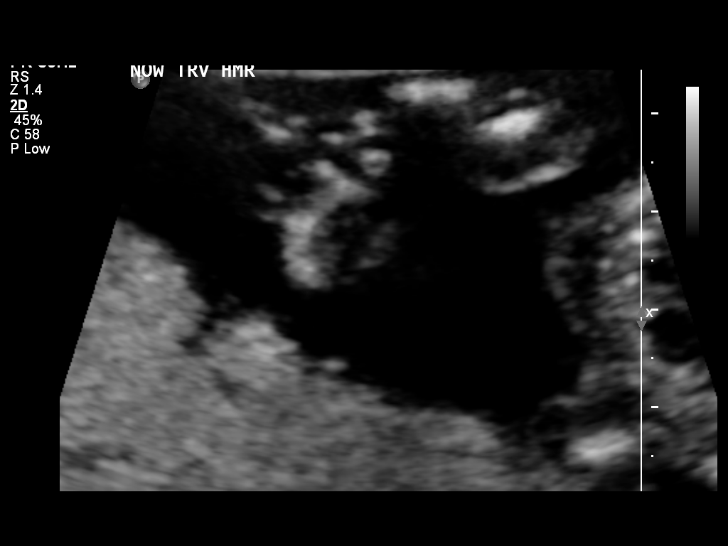
[im 36/70]
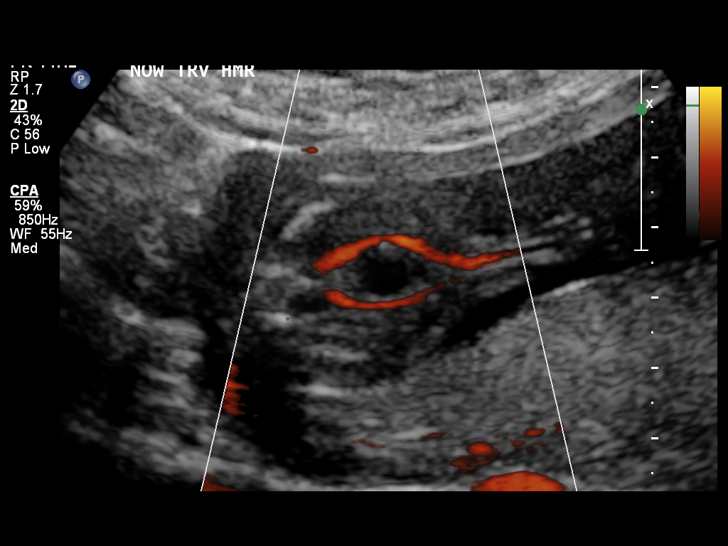
[im 42/70]
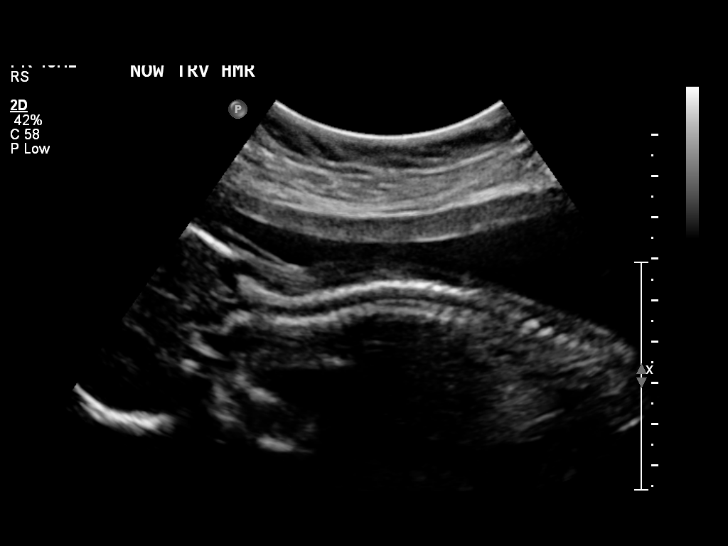
[im 47/70]
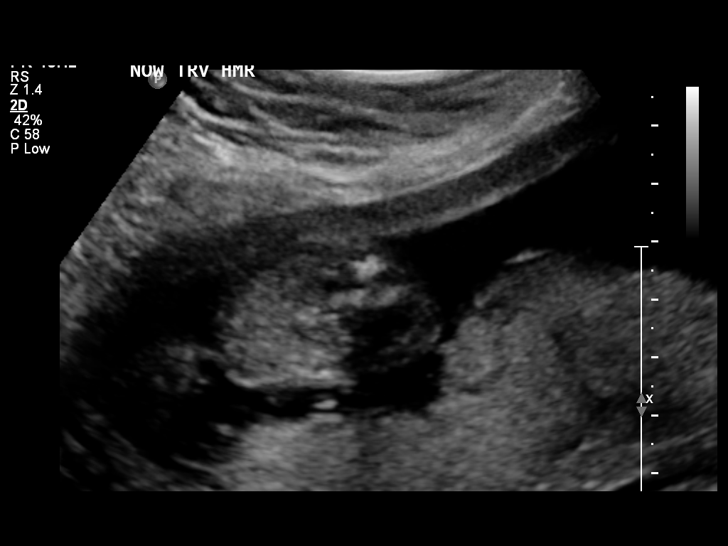
[im 56/70]
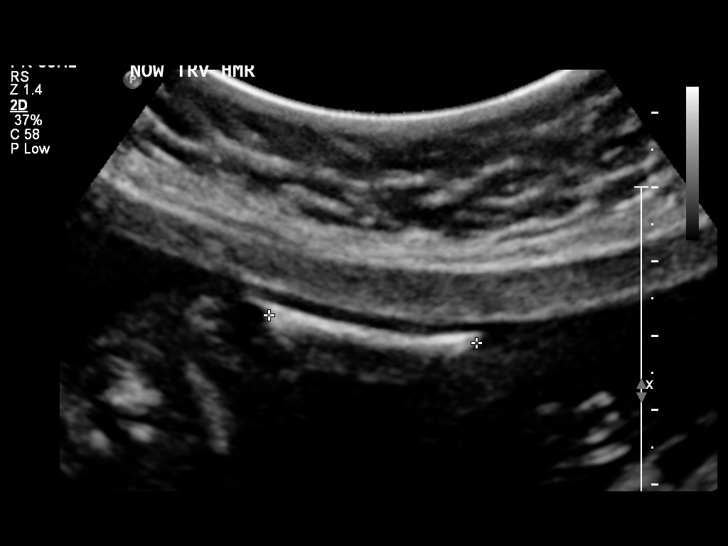
[im 61/70]
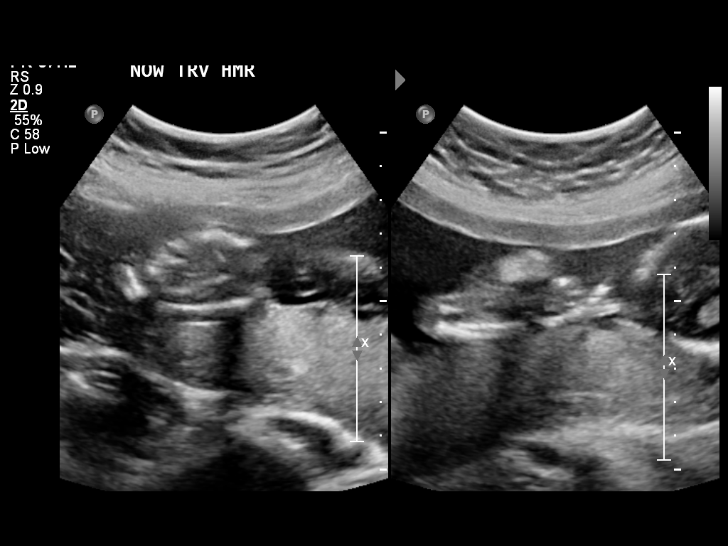
[im 67/70]
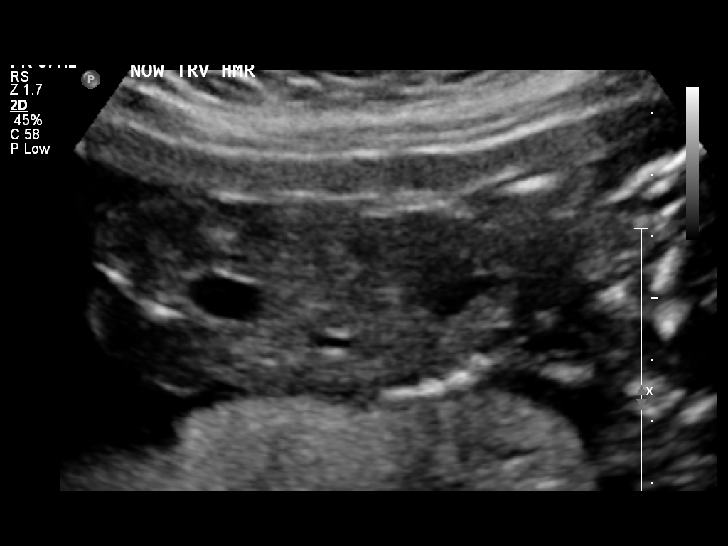

[12 of 28 positions shown; findings below may reference images not displayed]

OBSTETRICS REPORT
(Signed Final 02/09/2015 [DATE])

Date:

Service(s) Provided

US OB COMP + 14 WK                                     76805.1
Indications

18 weeks gestation of pregnancy
Basic anatomic survey                                  [id number]
Vaginal bleeding, unknown etiology (pt had
spotting last [REDACTED] [DATE])
Fetal Evaluation

Num Of             1
Fetuses:
Fetal Heart        153                          bpm
Rate:
Cardiac Activity:  Observed
Presentation:      Cephalic
Placenta:          Posterior, above cervical
os
P. Cord            Visualized, central
Insertion:

Amniotic Fluid
AFI FV:      Subjectively within normal limits
Larg Pckt:      4.4  cm
Biometry

BPD:     40.1   m    G. Age:   18w 1d                 CI:        72.92   70 - 86
m
FL/HC:      19.2   16.1 -
18.3
HC:     149.3   m    G. Age:   18w 0d        14  %    HC/AC:      1.09   1.09 -
m
AC:     137.6   m    G. Age:   19w 1d        62  %    FL/BPD
m                                     :
FL:      28.7   m    G. Age:   18w 6d        48  %    FL/AC:      20.9   20 - 24
m
HUM:     28.3   m    G. Age:   19w 1d        63  %
m
CER:     18.2   m    G. Age:   18w 1d        30  %
m

Est.         263   gm    0 lb 9 oz      49   %
FW:
Gestational Age

LMP:           18w 5d        Date:  10/01/14                  EDD:   07/08/15
U/S Today:     18w 4d                                         EDD:   07/09/15
Best:          18w 5d    Det. By:   LMP  (10/01/14)           EDD:   07/08/15
Anatomy

Cranium:          Appears normal         Aortic Arch:       Appears normal
Fetal Cavum:      Appears normal         Ductal Arch:       Not well visualized
Ventricles:       Appears normal         Diaphragm:         Appears normal
Choroid Plexus:   Appears normal         Stomach:           Appears normal,
left sided
Cerebellum:       Appears normal         Abdomen:           Appears normal
Posterior         Appears normal         Abdominal          Appears nml (cord
Fossa:                                   Wall:              insert, abd wall)
Nuchal Fold:      Appears normal         Cord Vessels:      Appears normal (3
vessel cord)
Face:             Appears normal         Kidneys:           Appear normal
(orbits and profile)
Lips:             Appears normal         Bladder:           Appears normal
Heart:            Appears normal         Spine:             Appears normal
(4CH, axis, and
situs)
RVOT:             Not well visualized    Lower              Appears normal
Extremities:
LVOT:             Not well visualized    Upper              Appears normal
Extremities:

Other:   Fetus appears to be a male. Heels visualized. Nasal bone
visualized. Technically difficult due to fetal position.
Cervix Uterus Adnexa

Cervical Length:    4.6       cm

Cervix:       Normal appearance by transabdominal scan.

Left Ovary:    Within normal limits.
Right Ovary:   Within normal limits.

Adnexa:     No abnormality visualized.
Impression

Single IUP at 18w 5d
Limited views of the fetal heart obtained
The remainder of the fetal anatomy appears normal
Posterior placenta without previa
Normal amniotic fluid volume

---------------------------------------------------------------------- Recommendations

Recommend follow-up ultrasound examination in 4-6 weeks
to complete anatomy

## 2017-03-02 ENCOUNTER — Encounter (HOSPITAL_COMMUNITY): Payer: Self-pay | Admitting: *Deleted

## 2017-03-02 ENCOUNTER — Inpatient Hospital Stay (HOSPITAL_COMMUNITY)
Admission: AD | Admit: 2017-03-02 | Discharge: 2017-03-02 | Disposition: A | Discharge status: Home / Self Care | Payer: Medicaid Other | Source: Ambulatory Visit | Attending: Family Medicine | Admitting: Family Medicine

## 2017-03-02 DIAGNOSIS — Z3202 Encounter for pregnancy test, result negative: Secondary | ICD-10-CM | POA: Insufficient documentation

## 2017-03-02 DIAGNOSIS — N912 Amenorrhea, unspecified: Secondary | ICD-10-CM

## 2017-03-02 DIAGNOSIS — Z87891 Personal history of nicotine dependence: Secondary | ICD-10-CM | POA: Insufficient documentation

## 2017-03-02 HISTORY — DX: Syphilis, unspecified: A53.9

## 2017-03-02 HISTORY — DX: Unspecified infectious disease: B99.9

## 2017-03-02 LAB — URINALYSIS, ROUTINE W REFLEX MICROSCOPIC
Bilirubin Urine: NEGATIVE
Glucose, UA: NEGATIVE mg/dL
Ketones, ur: NEGATIVE mg/dL
NITRITE: NEGATIVE
Protein, ur: NEGATIVE mg/dL
SPECIFIC GRAVITY, URINE: 1.027 (ref 1.005–1.030)
pH: 5 (ref 5.0–8.0)

## 2017-03-02 LAB — POCT PREGNANCY, URINE: PREG TEST UR: NEGATIVE

## 2017-03-02 LAB — HCG, SERUM, QUALITATIVE: Preg, Serum: NEGATIVE

## 2017-03-02 NOTE — MAU Provider Note (Signed)
History     CSN: [id number]  Arrival date and time: 03/02/17 1121   First Provider Initiated Contact with Patient 03/02/17 1333      Chief Complaint  Patient presents with  . Possible Pregnancy  . Abdominal Pain  . Nausea  . Dizziness   HPI  [name] 21 yo [id number] non-pregnant presenting to MAU with complaints of missed period x 2 wks and "feeling pregnant".  She states that each time she was pregnant in the past, she has had these same sx's.  She did not take a HPT, "because each time she was pregnant in the past, she had multiple negative HPTs".  She states "she knows her body and she knows there is something not right."  She reports that her "cycle is usually regular and will sometimes come on a few days before expected, but never late."  She states she knows she doesn't have any STDs that could possibly cause a missed period.  Past Medical History:  Diagnosis Date  . Chlamydia 01/2016  . Gonorrhea April 2015  . Gonorrhea   . Infection    UTI  . STD (female)   . Syphilis     Past Surgical History:  Procedure Laterality Date  . chin surgery    . HAND SURGERY     Left  hand  . RHYTIDECTOMY NECK / CHEEK / CHIN      Family History  Problem Relation Age of Onset  . Adopted: Yes  . Miscarriages / [place] Mother   . Miscarriages / Stillbirths Sister   . Miscarriages / Stillbirths Maternal Grandmother   . Alcohol abuse Neg Hx   . Arthritis Neg Hx   . Asthma Neg Hx   . COPD Neg Hx   . Cancer Neg Hx   . Birth defects Neg Hx   . Depression Neg Hx   . Diabetes Neg Hx   . Drug abuse Neg Hx   . Early death Neg Hx   . Hearing loss Neg Hx   . Heart disease Neg Hx   . Hyperlipidemia Neg Hx   . Hypertension Neg Hx   . Kidney disease Neg Hx   . Learning disabilities Neg Hx   . Mental illness Neg Hx   . Mental retardation Neg Hx   . Stroke Neg Hx   . Vision loss Neg Hx   . Varicose Veins Neg Hx   . Migraines Neg Hx     Social History  Substance Use  Topics  . Smoking status: Former Smoker    Types: Cigars    Quit date: 08/04/2013  . Smokeless tobacco: Never Used  . Alcohol use No    Allergies: No Known Allergies  Prescriptions Prior to Admission  Medication Sig Dispense Refill Last Dose  . ibuprofen (ADVIL,MOTRIN) 200 MG tablet Take 200 mg by mouth every 6 (six) hours as needed for headache or cramping.   prn    Review of Systems  Constitutional: Negative.   HENT: Negative.   Eyes: Negative.   Respiratory: Negative.   Cardiovascular: Negative.   Gastrointestinal: Positive for nausea.  Endocrine: Negative.   Genitourinary: Positive for menstrual problem (late x 2 wks). Negative for pelvic pain, vaginal bleeding and vaginal discharge.  Musculoskeletal: Negative.   Skin: Negative.   Allergic/Immunologic: Negative.   Neurological: Positive for dizziness and light-headedness.  Hematological: Negative.   Psychiatric/Behavioral: Negative.    Physical Exam   Blood pressure 113/68, pulse 78, temperature 98.2 F (36.8 C),  temperature source Oral, resp. rate 18, weight 91.2 kg (201 lb), last menstrual period 01/20/2017, SpO2 100 %, not currently breastfeeding.  Physical Exam  Constitutional: She is oriented to person, place, and time. She appears well-developed and well-nourished.  HENT:  Head: Normocephalic.  Eyes: Pupils are equal, round, and reactive to light.  Neck: Normal range of motion. Neck supple.  Cardiovascular: Normal rate, regular rhythm, normal heart sounds and intact distal pulses.   Respiratory: Effort normal and breath sounds normal.  GI: Soft. Bowel sounds are normal. There is no tenderness. There is no rebound and no guarding.  Musculoskeletal: Normal range of motion.  Neurological: She is alert and oriented to person, place, and time.  Skin: Skin is warm and dry.  Psychiatric: She has a normal mood and affect. Her behavior is normal. Judgment and thought content normal.    MAU Course   Procedures  MDM CCUA UPT  Qualitative HCG  Results for orders placed or performed during the hospital encounter of 03/02/17 (from the past 24 hour(s))  Urinalysis, Routine w reflex microscopic     Status: Abnormal   Collection Time: 03/02/17 11:33 AM  Result Value Ref Range   Color, Urine YELLOW YELLOW   APPearance HAZY (A) CLEAR   Specific Gravity, Urine 1.027 1.005 - 1.030   pH 5.0 5.0 - 8.0   Glucose, UA NEGATIVE NEGATIVE mg/dL   Hgb urine dipstick SMALL (A) NEGATIVE   Bilirubin Urine NEGATIVE NEGATIVE   Ketones, ur NEGATIVE NEGATIVE mg/dL   Protein, ur NEGATIVE NEGATIVE mg/dL   Nitrite NEGATIVE NEGATIVE   Leukocytes, UA LARGE (A) NEGATIVE   RBC / HPF 0-5 0 - 5 RBC/hpf   WBC, UA 6-30 0 - 5 WBC/hpf   Bacteria, UA RARE (A) NONE SEEN   Squamous Epithelial / LPF 6-30 (A) NONE SEEN   Mucous PRESENT   Pregnancy, urine POC     Status: None   Collection Time: 03/02/17 11:54 AM  Result Value Ref Range   Preg Test, Ur NEGATIVE NEGATIVE  hCG, serum, qualitative     Status: None   Collection Time: 03/02/17 12:50 PM  Result Value Ref Range   Preg, Serum NEGATIVE NEGATIVE    Assessment and Plan  Amenorrhea, unspecified - Recommend F/U in 3 wks, if no menses - Call [facility] to schedule appt in 3 wks  Discharge home Patient verbalized an understanding of the plan of care and agrees.   Raelyn Moraolitta Mikeisha Lemonds, MSN, CNM 03/02/2017, 1:45 PM

## 2017-03-02 NOTE — MAU Note (Signed)
Is never late.  Is currently  Going on almost 2 wks late.  Did not do a home test, "has never tested positive on a home test".  Has been really sick and dizzy. No bleeding. Has been having mild cramps off and on.

## 2017-04-20 IMAGING — US US MFM OB FOLLOW-UP
1 series · 14 of 28 positions shown · non-contrast
Comparison: none

[Series 1: us mfm ob follow-up · 77 acquisitions, 14 frames shown]
[im 3/77]
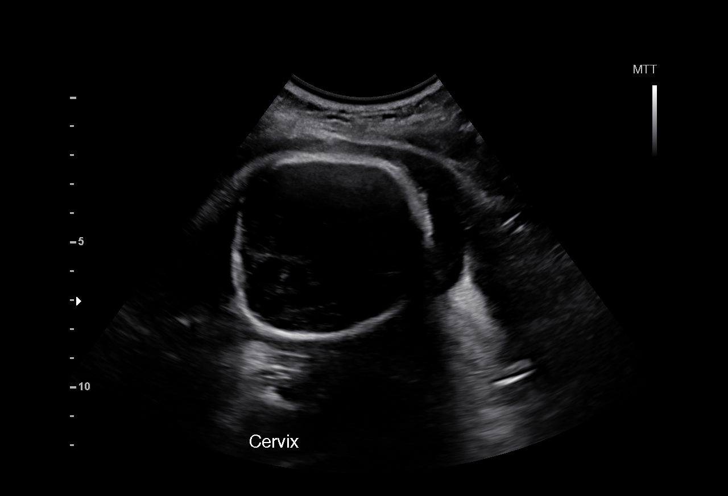
[im 9/77]
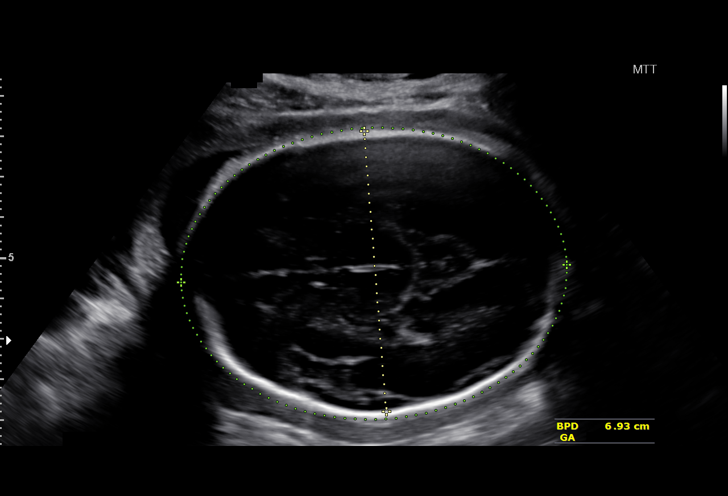
[im 15/77]
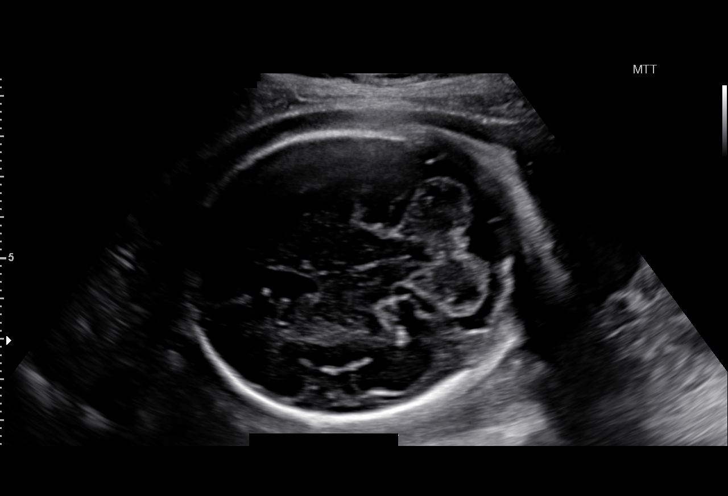
[im 20/77]
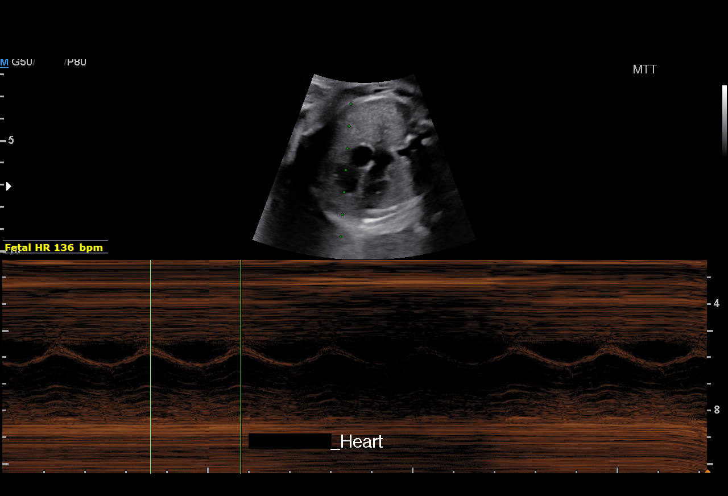
[im 26/77]
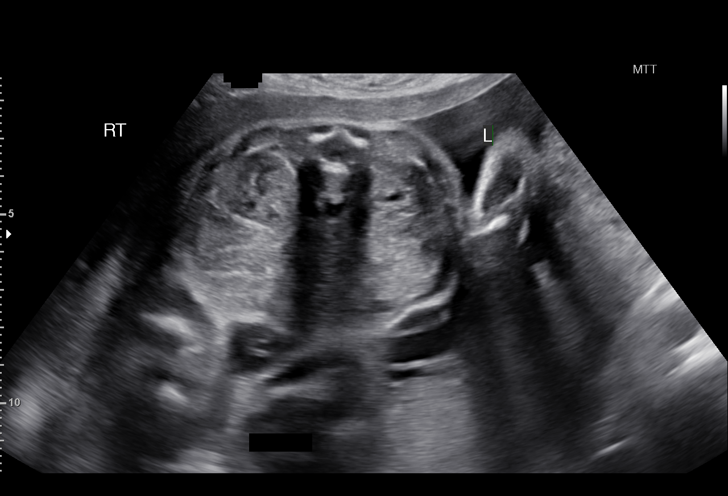
[im 31/77]
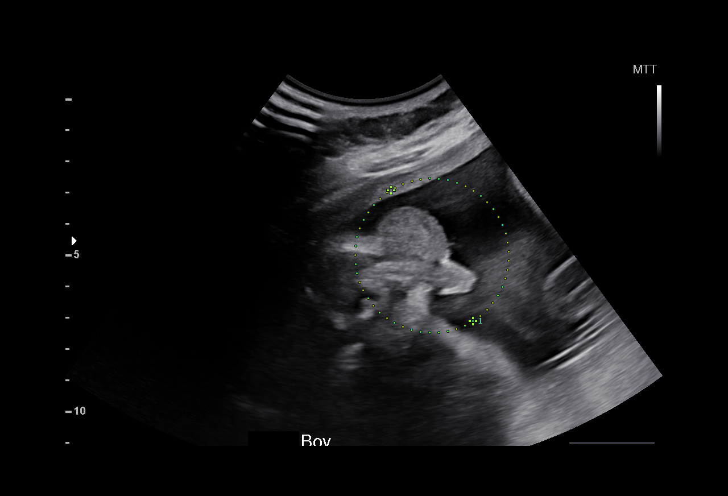
[im 37/77]
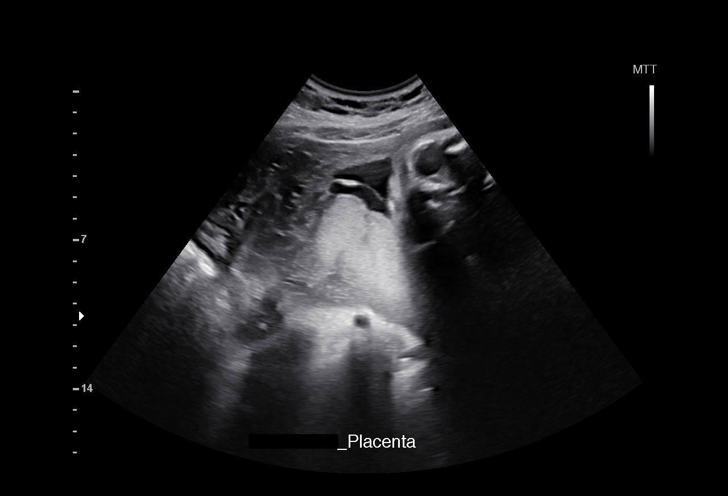
[im 43/77]
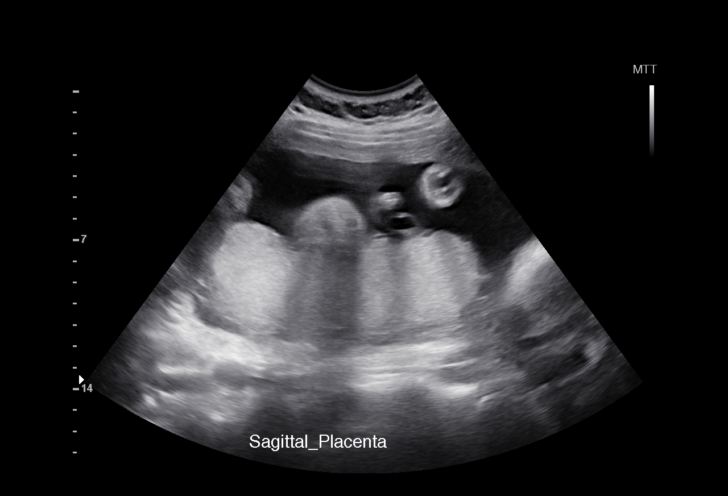
[im 48/77]
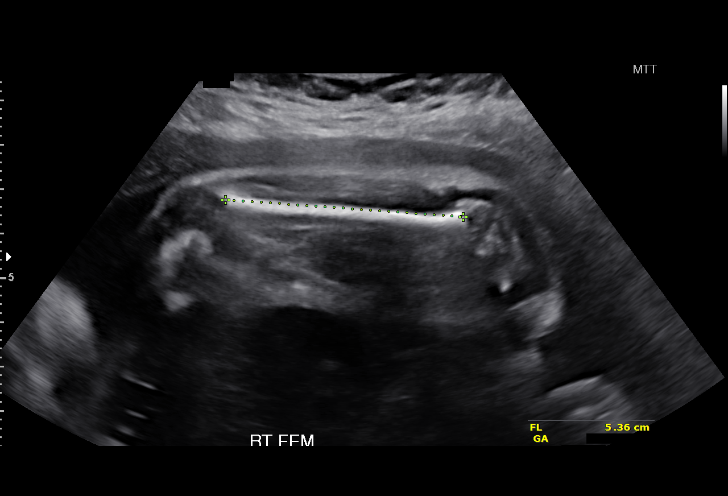
[im 54/77]
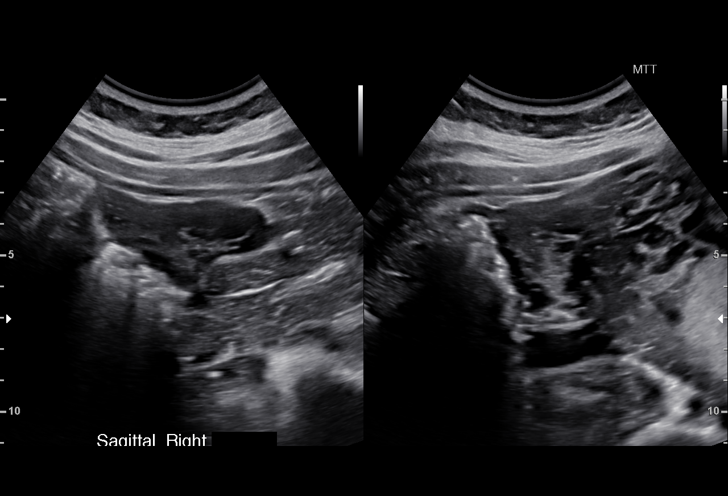
[im 60/77]
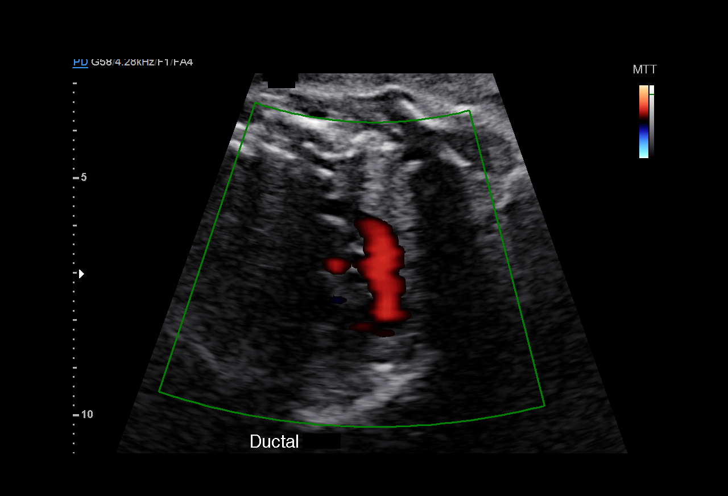
[im 65/77]
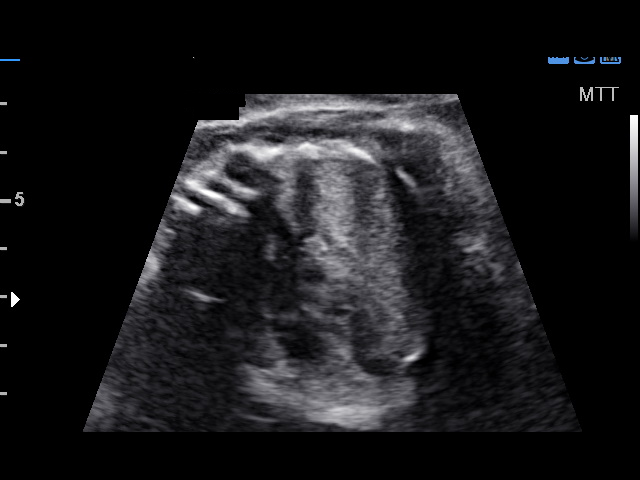
[im 71/77]
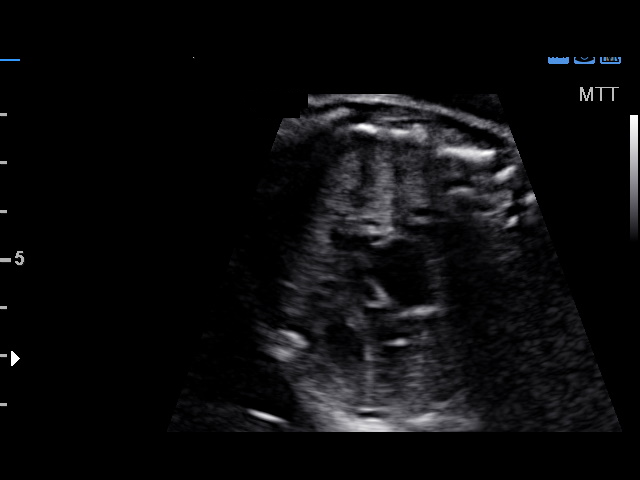
[im 77/77]
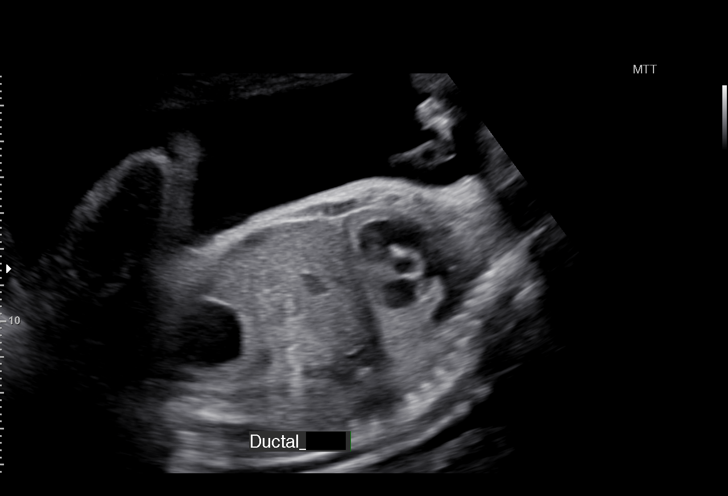

[14 of 28 positions shown; findings below may reference images not displayed]

OBSTETRICS REPORT
(Signed Final 04/24/2015 [DATE])

Service(s) Provided

Indications

29 weeks gestation of pregnancy
Large for gestational age fetus affecting
management of mother
Evaluate anatomy not seen on prior sonogram           [id number]
Fetal Evaluation

Num Of Fetuses:    1
Fetal Heart Rate:  136                          bpm
Cardiac Activity:  Observed
Presentation:      Cephalic
Placenta:          Posterior, above cervical
os
P. Cord            Previously Visualized
Insertion:

Amniotic Fluid
AFI FV:      Subjectively within normal limits
AFI Sum:     14.94   cm       52  %Tile     Larg Pckt:    4.04  cm
RUQ:   3.68    cm   RLQ:    3.71   cm    LUQ:   3.51    cm   LLQ:    4.04   cm
Biometry

BPD:     69.4  mm     G. Age:  27w 6d                CI:        70.41   70 - 86
FL/HC:      20.4   19.6 -
20.8
HC:     263.7  mm     G. Age:  28w 5d        8  %    HC/AC:      1.08   0.99 -
1.21
AC:     243.5  mm     G. Age:  28w 5d       24  %    FL/BPD:     77.4   71 - 87
FL:      53.7  mm     G. Age:  28w 3d       16  %    FL/AC:      22.1   20 - 24
HUM:     49.3  mm     G. Age:  29w 0d       39  %
CER:     34.6  mm     G. Age:  29w 6d       61  %

Est. FW:    7178  gm    2 lb 12 oz      36  %
Gestational Age

LMP:           29w 2d        Date:  10/01/14                 EDD:   07/08/15
U/S Today:     28w 3d                                        EDD:   07/14/15
Best:          29w 2d     Det. By:  LMP  (10/01/14)          EDD:   07/08/15
Anatomy

Cranium:          Appears normal         Aortic Arch:      Appears normal
Fetal Cavum:      Appears normal         Ductal Arch:      Appears normal
Ventricles:       Appears normal         Diaphragm:        Appears normal
Choroid Plexus:   Appears normal         Stomach:          Appears normal, left
sided
Cerebellum:       Appears normal         Abdomen:          Appears normal
Posterior Fossa:  Appears normal         Abdominal Wall:   Appears nml (cord
insert, abd wall)
Nuchal Fold:      Previously seen        Cord Vessels:     Previously seen
Face:             Orbits and profile     Kidneys:          Appear normal
previously seen
Lips:             Previously seen        Bladder:          Appears normal
Heart:            Appears normal         Spine:            Previously seen
(4CH, axis, and
situs)
RVOT:             Appears normal         Lower             Previously seen
Extremities:
LVOT:             Appears normal         Upper             Previously seen
Extremities:

Other:  Fetus appears to be a male. Nasal bone visualized previously. Heels
visualized previously.
Targeted Anatomy

Fetal Central Nervous System
Lat. Ventricles:  4.1                    Cisterna Magna:
Cervix Uterus Adnexa

Cervical Length:    3.01     cm

Cervix:       Normal appearance by transabdominal scan.
Uterus:       No abnormality visualized.
Cul De Sac:   No free fluid seen.

Left Ovary:    No adnexal mass visualized.
Right Ovary:   No adnexal mass visualized.
Adnexa:     No abnormality visualized.
Impression

Single IUP at 29w 2d
EFW 36th%
no dysmorphic features
Posterior placenta without previa
Normal amniotic fluid volume
Recommendations

Follow up as clinically indicated.

questions or concerns.

## 2017-06-16 ENCOUNTER — Other Ambulatory Visit: Payer: Self-pay

## 2017-06-16 ENCOUNTER — Emergency Department (HOSPITAL_COMMUNITY)
Admission: EM | Admit: 2017-06-16 | Discharge: 2017-06-17 | Disposition: A | Discharge status: Home / Self Care | Payer: Self-pay | Attending: Emergency Medicine | Admitting: Emergency Medicine

## 2017-06-16 ENCOUNTER — Encounter (HOSPITAL_COMMUNITY): Payer: Self-pay | Admitting: Emergency Medicine

## 2017-06-16 DIAGNOSIS — E876 Hypokalemia: Secondary | ICD-10-CM | POA: Insufficient documentation

## 2017-06-16 DIAGNOSIS — Z87891 Personal history of nicotine dependence: Secondary | ICD-10-CM | POA: Insufficient documentation

## 2017-06-16 DIAGNOSIS — R55 Syncope and collapse: Secondary | ICD-10-CM

## 2017-06-16 LAB — CBC
HCT: 35.6 % — ABNORMAL LOW (ref 36.0–46.0)
HEMOGLOBIN: 11.8 g/dL — AB (ref 12.0–15.0)
MCH: 28.9 pg (ref 26.0–34.0)
MCHC: 33.1 g/dL (ref 30.0–36.0)
MCV: 87 fL (ref 78.0–100.0)
Platelets: 246 10*3/uL (ref 150–400)
RBC: 4.09 MIL/uL (ref 3.87–5.11)
RDW: 13 % (ref 11.5–15.5)
WBC: 13.9 10*3/uL — AB (ref 4.0–10.5)

## 2017-06-16 LAB — BASIC METABOLIC PANEL
ANION GAP: 5 (ref 5–15)
BUN: 11 mg/dL (ref 6–20)
CALCIUM: 8 mg/dL — AB (ref 8.9–10.3)
CO2: 25 mmol/L (ref 22–32)
Chloride: 110 mmol/L (ref 101–111)
Creatinine, Ser: 0.76 mg/dL (ref 0.44–1.00)
Glucose, Bld: 107 mg/dL — ABNORMAL HIGH (ref 65–99)
Potassium: 3 mmol/L — ABNORMAL LOW (ref 3.5–5.1)
SODIUM: 140 mmol/L (ref 135–145)

## 2017-06-16 LAB — URINALYSIS, ROUTINE W REFLEX MICROSCOPIC
Bacteria, UA: NONE SEEN
Bilirubin Urine: NEGATIVE
GLUCOSE, UA: NEGATIVE mg/dL
HGB URINE DIPSTICK: NEGATIVE
KETONES UR: NEGATIVE mg/dL
NITRITE: NEGATIVE
PH: 6 (ref 5.0–8.0)
PROTEIN: NEGATIVE mg/dL
Specific Gravity, Urine: 1.019 (ref 1.005–1.030)

## 2017-06-16 LAB — CBG MONITORING, ED: Glucose-Capillary: 106 mg/dL — ABNORMAL HIGH (ref 65–99)

## 2017-06-16 LAB — I-STAT BETA HCG BLOOD, ED (MC, WL, AP ONLY)

## 2017-06-16 MED ORDER — SODIUM CHLORIDE 0.9 % IV BOLUS (SEPSIS)
1000.0000 mL | Freq: Once | INTRAVENOUS | Status: AC
  Administered 2017-06-16: 1000 mL via INTRAVENOUS

## 2017-06-16 MED ORDER — POTASSIUM CHLORIDE CRYS ER 20 MEQ PO TBCR
40.0000 meq | EXTENDED_RELEASE_TABLET | Freq: Once | ORAL | Status: AC
  Administered 2017-06-16: 40 meq via ORAL
  Filled 2017-06-16: qty 2

## 2017-06-16 NOTE — ED Notes (Signed)
Patient ambulatory to restroom with steady gait, no complaints of dizziness or light headedness

## 2017-06-16 NOTE — ED Triage Notes (Signed)
[place] [facility] [place]. Patient gave plasma around 1800 today, patient is a regular plasma giver and this has happened before but has never been seen about it. Patient did not eat or drink before or after giving plasma. Patient was sitting on stretcher when syncoped, no head injury, positive LOC as witnessed by paramedics.

## 2017-06-17 NOTE — ED Provider Notes (Signed)
[facility]-EMERGENCY DEPT Provider Note   CSN: [id number] Arrival date & time: 06/16/17  2100     History   Chief Complaint Chief Complaint  Patient presents with  . Loss of Consciousness    HPI Nicole Mclean is a [age] y.o. female.  The history is provided by the patient and medical records. No language interpreter was used.   Nicole Mclean is a 21 y.o. female  who presents to the Emergency Department evaluation following syncopal episode just prior to arrival.  Patient was brought in by EMS from dyspnea on ice.  Patient states that she was taking her children to see the show.  At information, she got up with children to get them a snack.  She became very lightheaded and did not feel well.  Her mother went to go get assistance.  She was taken to a medical tent.  When they tried to get her onto the stretcher, she had a witnessed syncopal episode.  Patient does state that she donated plasma around 6 PM today just prior to the show.  She also notes that she had not eaten anything for lunch or dinner today.  She reports feeling rushed to get to show on time, therefore missed her meals.  She states that she has had syncopal episodes similar to this in the past after giving plasma, but due to financial issues, she felt like she had no choice but to donate again.  She received 500 cc of fluids in route.  She now feels well with no complaints of dizziness or lightheadedness.  Currently with no complaints.  Past Medical History:  Diagnosis Date  . Chlamydia 01/2016  . Gonorrhea April 2015  . Gonorrhea   . Infection    UTI  . STD (female)   . Syphilis     Patient Active Problem List   Diagnosis Date Noted  . Lower abdominal pain 08/15/2016  . Abnormal uterine bleeding (AUB) 12/08/2015  . Postpartum examination following vaginal delivery 08/12/2015  . 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 Other iron deficiency anemias 04/30/2015    Past Surgical History:  Procedure  Laterality Date  . chin surgery    . HAND SURGERY     Left  hand  . RHYTIDECTOMY NECK / CHEEK / CHIN      OB History    Gravida Para Term Preterm AB Living   3 2 2  0 1 2   SAB TAB Ectopic Multiple Live Births   1 0 0 0 2       Home Medications    Prior to Admission medications   Medication Sig Start Date End Date Taking? Authorizing Provider  ibuprofen (ADVIL,MOTRIN) 200 MG tablet Take 200 mg by mouth every 6 (six) hours as needed for headache or cramping.   Yes [provider]    Family History Family History  Adopted: Yes  Problem Relation Age of Onset  . [place] Mother   . Miscarriages / Stillbirths Sister   . Miscarriages / Stillbirths Maternal Grandmother   . Alcohol abuse Neg Hx   . Arthritis Neg Hx   . Asthma Neg Hx   . COPD Neg Hx   . Cancer Neg Hx   . Birth defects Neg Hx   . Depression Neg Hx   . Diabetes Neg Hx   . Drug abuse Neg Hx   . Early death Neg Hx   . Hearing loss Neg Hx   . Heart disease Neg  Hx   . Hyperlipidemia Neg Hx   . Hypertension Neg Hx   . Kidney disease Neg Hx   . Learning disabilities Neg Hx   . Mental illness Neg Hx   . Mental retardation Neg Hx   . Stroke Neg Hx   . Vision loss Neg Hx   . Varicose Veins Neg Hx   . Migraines Neg Hx     Social History Social History   Tobacco Use  . Smoking status: Former Smoker    Types: Cigars    Last attempt to quit: 08/04/2013    Years since quitting: 3.8  . Smokeless tobacco: Never Used  Substance Use Topics  . Alcohol use: No    Alcohol/week: 0.0 oz  . Drug use: No     Allergies   Patient has no known allergies.   Review of Systems Review of Systems  Neurological: Positive for syncope and light-headedness. Negative for dizziness, weakness, numbness and headaches.  All other systems reviewed and are negative.    Physical Exam Updated Vital Signs BP (!) 102/58   Pulse 87   Temp 98.3 F (36.8 C) (Oral)   Resp (!) 25   SpO2 99%    Physical Exam  Constitutional: She is oriented to person, place, and time. She appears well-developed and well-nourished. No distress.  HENT:  No evidence of tongue biting  Neck: Normal range of motion. Neck supple. No JVD present.  No carotid bruits  Cardiovascular: Normal rate, regular rhythm and normal heart sounds. Exam reveals no gallop and no friction rub.  No murmur heard. Intact and equal pulses in all four extremities  Pulmonary/Chest: Effort normal and breath sounds normal. No respiratory distress. She has no wheezes. She has no rales.  Abdominal: Soft. She exhibits no distension and no mass. There is no tenderness. There is no rebound and no guarding.  Musculoskeletal: Normal range of motion.  Neurological: She is alert and oriented to person, place, and time.  Alert, oriented, thought content appropriate, able to give a coherent history. Speech is clear and goal oriented, able to follow commands.  Cranial Nerves:  II:  Peripheral visual fields grossly normal, pupils equal, round, reactive to light III, IV, VI: EOM intact bilaterally, ptosis not present V,VII: smile symmetric, eyes kept closed tightly against resistance, facial light touch sensation equal VIII: hearing grossly normal IX, X: symmetric soft palate movement, uvula elevates symmetrically  XI: bilateral shoulder shrug symmetric and strong XII: midline tongue extension 5/5 muscle strength in upper and lower extremities bilaterally including strong and equal grip strength and dorsiflexion/plantar flexion Sensory to light touch normal in all four extremities.  Normal finger-to-nose and rapid alternating movements. No drift. Steady gait.  Skin: Capillary refill takes less than 2 seconds. No pallor.  Psychiatric: She has a normal mood and affect. Her behavior is normal. Judgment and thought content normal.  Nursing note and vitals reviewed.    ED Treatments / Results  Labs (all labs ordered are listed, but only  abnormal results are displayed) Labs Reviewed  BASIC METABOLIC PANEL - Abnormal; Notable for the following components:      Result Value   Potassium 3.0 (*)    Glucose, Bld 107 (*)    Calcium 8.0 (*)    All other components within normal limits  CBC - Abnormal; Notable for the following components:   WBC 13.9 (*)    Hemoglobin 11.8 (*)    HCT 35.6 (*)    All other components within normal  limits  URINALYSIS, ROUTINE W REFLEX MICROSCOPIC - Abnormal; Notable for the following components:   Leukocytes, UA TRACE (*)    Squamous Epithelial / LPF 0-5 (*)    All other components within normal limits  CBG MONITORING, ED - Abnormal; Notable for the following components:   Glucose-Capillary 106 (*)    All other components within normal limits  I-STAT BETA HCG BLOOD, ED ([facility], [facility], [facility] ONLY)    EKG  EKG Interpretation  Date/Time:  Friday June 16 2017 21:26:21 EST Ventricular Rate:  87 PR Interval:    QRS Duration: 91 QT Interval:  364 QTC Calculation: 438 R Axis:   45 Text Interpretation:  Sinus rhythm Confirmed by Bethann Berkshire [id number]) on 06/16/2017 11:24:07 PM       Radiology No results found.  Procedures Procedures (including critical care time)  Medications Ordered in ED Medications  sodium chloride 0.9 % bolus 1,000 mL (1,000 mLs Intravenous New Bag/Given 06/16/17 2219)  potassium chloride SA (K-DUR,KLOR-CON) CR tablet 40 mEq (40 mEq Oral Given 06/16/17 2342)     Initial Impression / Assessment and Plan / ED Course  I have reviewed the triage vital signs and the nursing notes.  Pertinent labs & imaging results that were available during my care of the patient were reviewed by me and considered in my medical decision making (see chart for details).    Nicole Mclean is a 21 y.o. female who presents to ED for evaluation of syncopal episode just prior to arrival. Patient donated plasma today and also has not eaten lunch or dinner.   On exam, patient is afebrile,  hemodynamically stable with no focal neuro deficits and benign cardiopulmonary exam. EKG NSR. Labs reassuring. Patient did have hypokalemia at 3.0 which was replenished in ED Patient with  no personal or family history of sudden, young cardiac death.  No hx of CHF.  No complaints of chest pain or shortness of breath. Low risk per [facility] Syncope Rule.   Evaluation does not show pathology that would require ongoing emergent intervention or inpatient treatment. Will discharge to home with close follow up. Reasons to return to ER were discussed.   Pt has remained hemodynamically stable throughout their time in the ED.   BP (!) 102/58   Pulse 87   Temp 98.3 F (36.8 C) (Oral)   Resp (!) 25   SpO2 99%   Patient understands return precautions and follow up care. All questions answered.    Final Clinical Impressions(s) / ED Diagnoses   Final diagnoses:  Syncope and collapse  Hypokalemia    ED Discharge Orders    None       Tanysha Quant, Chase Picket, PA-C 06/17/17 0008    Bethann Berkshire, MD 06/17/17 [phone]

## 2017-06-17 NOTE — Discharge Instructions (Signed)
It was my pleasure taking care of you today!   Stay hydrated. Rest.   Follow up with a primary care provider. If you do not have one, please see the information below.   Return to ER for new or worsening symptoms, any additional concerns.

## 2017-07-19 ENCOUNTER — Emergency Department (HOSPITAL_COMMUNITY): Payer: Medicaid Other

## 2017-07-19 ENCOUNTER — Emergency Department (HOSPITAL_COMMUNITY)
Admission: EM | Admit: 2017-07-19 | Discharge: 2017-07-19 | Disposition: A | Discharge status: Home / Self Care | Payer: Medicaid Other | Attending: Emergency Medicine | Admitting: Emergency Medicine

## 2017-07-19 ENCOUNTER — Other Ambulatory Visit: Payer: Self-pay

## 2017-07-19 ENCOUNTER — Encounter (HOSPITAL_COMMUNITY): Payer: Self-pay

## 2017-07-19 DIAGNOSIS — Z87891 Personal history of nicotine dependence: Secondary | ICD-10-CM | POA: Insufficient documentation

## 2017-07-19 DIAGNOSIS — J029 Acute pharyngitis, unspecified: Secondary | ICD-10-CM | POA: Diagnosis present

## 2017-07-19 LAB — RAPID STREP SCREEN (MED CTR MEBANE ONLY): STREPTOCOCCUS, GROUP A SCREEN (DIRECT): NEGATIVE

## 2017-07-19 MED ORDER — ACETAMINOPHEN 325 MG PO TABS
650.0000 mg | ORAL_TABLET | Freq: Once | ORAL | Status: AC
Start: 2017-07-19 — End: 2017-07-19
  Administered 2017-07-19: 650 mg via ORAL
  Filled 2017-07-19: qty 2

## 2017-07-19 MED ORDER — IBUPROFEN 800 MG PO TABS: 800.0000 mg | ORAL_TABLET | Freq: Three times a day (TID) | ORAL | 0 refills | Status: DC

## 2017-07-19 MED ORDER — IBUPROFEN 800 MG PO TABS
800.0000 mg | ORAL_TABLET | Freq: Once | ORAL | Status: AC
  Administered 2017-07-19: 800 mg via ORAL
  Filled 2017-07-19: qty 1

## 2017-07-19 MED ORDER — DEXAMETHASONE SODIUM PHOSPHATE 10 MG/ML IJ SOLN
10.0000 mg | Freq: Once | INTRAMUSCULAR | Status: AC
  Administered 2017-07-19: 10 mg via INTRAMUSCULAR
  Filled 2017-07-19: qty 1

## 2017-07-19 NOTE — ED Triage Notes (Signed)
Sore throat x 3 days with white coating. Denies fevers or chills

## 2017-07-19 NOTE — ED Provider Notes (Signed)
[facility] Note   CSN: [id number] Arrival date & time: 07/19/17  1711     History   Chief Complaint Chief Complaint  Patient presents with  . Sore Throat    HPI Nicole Mclean is a 22 y.o. female who presents to the emergency department complaining of progressively worsening sore throat for the past 3 days.  Patient states the pain is a 7 out of 10 in severity, better with hot tea, worse with swallowing, however patient is able to swallow.  Reports associated left ear pain as well as productive cough with mucus sputum.  States she has felt fatigued over the past 24 hours and has also felt subjectively warm.  Reports no recent sick contacts. Denies congestion, rhinorrhea, dyspnea, chest pain, or abdominal pain.   HPI  Past Medical History:  Diagnosis Date  . Chlamydia 01/2016  . Gonorrhea April 2015  . Gonorrhea   . Infection    UTI  . STD (female)   . Syphilis     Patient Active Problem List   Diagnosis Date Noted  . Lower abdominal pain 08/15/2016  . Abnormal uterine bleeding (AUB) 12/08/2015  . Postpartum examination following vaginal delivery 08/12/2015  . 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 Other iron deficiency anemias 04/30/2015    Past Surgical History:  Procedure Laterality Date  . chin surgery    . HAND SURGERY     Left  hand  . RHYTIDECTOMY NECK / CHEEK / CHIN      OB History    Gravida Para Term Preterm AB Living   3 2 2  0 1 2   SAB TAB Ectopic Multiple Live Births   1 0 0 0 2       Home Medications    Prior to Admission medications   Medication Sig Start Date End Date Taking? Authorizing Provider  ibuprofen (ADVIL,MOTRIN) 200 MG tablet Take 200 mg by mouth every 6 (six) hours as needed for headache or cramping.    [provider]    Family History Family History  Adopted: Yes  Problem Relation Age of Onset  . Misc[place]riages / [place] Mother   . Miscarriages / Stillbirths Sister   .  Miscarriages / Stillbirths Maternal Grandmother   . Alcohol abuse Neg Hx   . Arthritis Neg Hx   . Asthma Neg Hx   . COPD Neg Hx   . Cancer Neg Hx   . Birth defects Neg Hx   . Depression Neg Hx   . Diabetes Neg Hx   . Drug abuse Neg Hx   . Early death Neg Hx   . Hearing loss Neg Hx   . Heart disease Neg Hx   . Hyperlipidemia Neg Hx   . Hypertension Neg Hx   . Kidney disease Neg Hx   . Learning disabilities Neg Hx   . Mental illness Neg Hx   . Mental retardation Neg Hx   . Stroke Neg Hx   . Vision loss Neg Hx   . Varicose Veins Neg Hx   . Migraines Neg Hx     Social History Social History   Tobacco Use  . Smoking status: Former Smoker    Types: Cigars    Last attempt to quit: 08/04/2013    Years since quitting: 3.9  . Smokeless tobacco: Never Used  Substance Use Topics  . Alcohol use: No    Alcohol/week: 0.0 oz  . Drug use: No  Allergies   Patient has no known allergies.   Review of Systems Review of Systems  Constitutional: Positive for fatigue and fever (subjectively warm).  HENT: Positive for ear pain (L) and sore throat. Negative for congestion and rhinorrhea.   Respiratory: Positive for cough. Negative for shortness of breath.   Cardiovascular: Negative for chest pain.  Gastrointestinal: Negative for abdominal pain, diarrhea and vomiting.  Musculoskeletal: Negative for neck pain and neck stiffness.    Physical Exam Updated Vital Signs BP 120/78 (BP Location: Right Arm)   Pulse 98   Temp (!) 102.7 F (39.3 C) (Oral)   Resp 16   Ht 5\' 1"  (1.549 m)   Wt 91.2 kg (201 lb)   LMP 07/01/2017   SpO2 100%   BMI 37.98 kg/m   Physical Exam  Constitutional: She appears well-developed and well-nourished.  Non-toxic appearance. No distress.  HENT:  Head: Normocephalic and atraumatic.  Right Ear: No mastoid tenderness. Tympanic membrane is not perforated, not erythematous, not retracted and not bulging.  Left Ear: No mastoid tenderness. Tympanic membrane  is not perforated, not erythematous, not retracted and not bulging.  Nose: Mucosal edema present.  Mouth/Throat: Uvula is midline. No trismus in the jaw. No uvula swelling. Oropharyngeal exudate and posterior oropharyngeal erythema present. Tonsils are 2+ on the right. Tonsils are 2+ on the left.  No hot potato voice. Patient tolerating secretions without difficulty.   Eyes: Conjunctivae are normal. Pupils are equal, round, and reactive to light. Right eye exhibits no discharge. Left eye exhibits no discharge.  Neck: Normal range of motion. Neck supple.  Submandibular compartments are soft.   Cardiovascular: Normal rate and regular rhythm.  No murmur heard. Pulmonary/Chest: Breath sounds normal. No respiratory distress. She has no wheezes. She has no rales.  Abdominal: Soft. She exhibits no distension. There is no splenomegaly. There is no tenderness.  Lymphadenopathy:    She has cervical adenopathy (anterior bilaterally).  Neurological: She is alert.  Skin: Skin is warm and dry. No rash noted.  Psychiatric: She has a normal mood and affect. Her behavior is normal.  Nursing note and vitals reviewed.   ED Treatments / Results  Labs (all labs ordered are listed, but only abnormal results are displayed) Labs Reviewed  RAPID STREP SCREEN (NOT AT [facility])  CULTURE, GROUP A STREP [facility])   EKG  EKG Interpretation None      Radiology Dg Chest 2 View  Result Date: 07/19/2017 CLINICAL DATA:  Sore throat x3 days. EXAM: CHEST  2 VIEW COMPARISON:  04/27/2016 FINDINGS: The heart size and mediastinal contours are within normal limits. Both lungs are clear. The visualized skeletal structures are unremarkable. IMPRESSION: No active cardiopulmonary disease. Electronically Signed   By: Tollie Eth M.D.   On: 07/19/2017 20:23   Procedures Procedures (including critical care time)  Medications Ordered in ED Medications  acetaminophen (TYLENOL) tablet 650 mg (650 mg Oral Given 07/19/17 1826)    dexamethasone (DECADRON) injection 10 mg (10 mg Intramuscular Given 07/19/17 2133)  ibuprofen (ADVIL,MOTRIN) tablet 800 mg (800 mg Oral Given 07/19/17 2132)    Initial Impression / Assessment and Plan / ED Course  I have reviewed the triage vital signs and the nursing notes.  Pertinent labs & imaging results that were available during my care of the patient were reviewed by me and considered in my medical decision making (see chart for details).    Patient presents with symptoms consistent with viral illness. She is nontoxic appearing, in no apparent distress,  noted to be febrile treated with Tylenol, vitals otherwise WNL. Patient with tonsillar erythema/exudates- rapid strep negative, culture pending. She is tolerating secretions, no trismus, full ROM of neck, submandibular compartment is soft- doubt PTA/RPA. Patient is without adventitious sounds on lung exam, CXR negative for infiltrate, doubt PNA. No wheezing on exam. Suspect viral etiology at this time pending strep culture. Additionally would consider mono if symptoms persist, no splenomegaly on exam. Will treat with decadron and Ibuprofen in the ED. I discussed results, treatment plan, need for PCP follow-up, and return precautions with the patient. Provided opportunity for questions, patient confirmed understanding and is in agreement with plan.   Final Clinical Impressions(s) / ED Diagnoses   Final diagnoses:  Viral pharyngitis    ED Discharge Orders        Ordered    ibuprofen (ADVIL,MOTRIN) 800 MG tablet  3 times daily     07/19/17 2125       Cherly Anderson, PA-C 07/20/17 0138    Charlynne Pander, MD 07/22/17 1500

## 2017-07-19 NOTE — ED Notes (Signed)
PT states understanding of care given, follow up care, and medication prescribed. PT ambulated from ED to car with a steady gait. 

## 2017-07-19 NOTE — Discharge Instructions (Signed)
You were seen in the emergency department for a sore throat.   Your strep test was negative, your culture is pending, we will call you if this is positive.   Your chest x-ray did not show any pneumonia.   I have prescribed you Ibuprofen for pain. You may take this once every 8 hours as needed. Take it with food as it can cause stomach upset and at worst stomach bleeding.   Follow up with a primary care provider in the next 1 week if  you are not feeling better. Return to the emergency department for any new or worsening symptoms including but not limited to worsening pain, inability to swallow, open your mouth, or move your neck, or difficulty breathing.

## 2017-07-22 LAB — CULTURE, GROUP A STREP (THRC)

## 2017-10-11 ENCOUNTER — Emergency Department (HOSPITAL_COMMUNITY)
Admission: EM | Admit: 2017-10-11 | Discharge: 2017-10-11 | Disposition: A | Discharge status: Home / Self Care | Payer: Medicaid Other | Attending: Emergency Medicine | Admitting: Emergency Medicine

## 2017-10-11 ENCOUNTER — Other Ambulatory Visit: Payer: Self-pay

## 2017-10-11 ENCOUNTER — Encounter (HOSPITAL_COMMUNITY): Payer: Self-pay

## 2017-10-11 DIAGNOSIS — Z87891 Personal history of nicotine dependence: Secondary | ICD-10-CM | POA: Diagnosis not present

## 2017-10-11 DIAGNOSIS — Z79899 Other long term (current) drug therapy: Secondary | ICD-10-CM | POA: Diagnosis not present

## 2017-10-11 DIAGNOSIS — K625 Hemorrhage of anus and rectum: Secondary | ICD-10-CM | POA: Diagnosis not present

## 2017-10-11 LAB — COMPREHENSIVE METABOLIC PANEL
ALT: 25 U/L (ref 14–54)
ANION GAP: 9 (ref 5–15)
AST: 20 U/L (ref 15–41)
Albumin: 3.6 g/dL (ref 3.5–5.0)
Alkaline Phosphatase: 74 U/L (ref 38–126)
BUN: 9 mg/dL (ref 6–20)
CALCIUM: 9.3 mg/dL (ref 8.9–10.3)
CHLORIDE: 106 mmol/L (ref 101–111)
CO2: 24 mmol/L (ref 22–32)
Creatinine, Ser: 0.64 mg/dL (ref 0.44–1.00)
GFR calc non Af Amer: >60 mL/min (ref >60)
Glucose, Bld: 92 mg/dL (ref 65–99)
POTASSIUM: 4.1 mmol/L (ref 3.5–5.1)
SODIUM: 139 mmol/L (ref 135–145)
Total Bilirubin: 0.4 mg/dL (ref 0.3–1.2)
Total Protein: 6.2 g/dL — ABNORMAL LOW (ref 6.5–8.1)

## 2017-10-11 LAB — URINALYSIS, ROUTINE W REFLEX MICROSCOPIC
Bacteria, UA: NONE SEEN
Bilirubin Urine: NEGATIVE
Glucose, UA: NEGATIVE mg/dL
HGB URINE DIPSTICK: NEGATIVE
Ketones, ur: NEGATIVE mg/dL
Nitrite: NEGATIVE
PROTEIN: NEGATIVE mg/dL
Specific Gravity, Urine: 1.01 (ref 1.005–1.030)
pH: 6 (ref 5.0–8.0)

## 2017-10-11 LAB — CBC
HCT: 38.2 % (ref 36.0–46.0)
HEMOGLOBIN: 12.5 g/dL (ref 12.0–15.0)
MCH: 28.9 pg (ref 26.0–34.0)
MCHC: 32.7 g/dL (ref 30.0–36.0)
MCV: 88.2 fL (ref 78.0–100.0)
Platelets: 276 10*3/uL (ref 150–400)
RBC: 4.33 MIL/uL (ref 3.87–5.11)
RDW: 12.3 % (ref 11.5–15.5)
WBC: 8.3 10*3/uL (ref 4.0–10.5)

## 2017-10-11 LAB — I-STAT BETA HCG BLOOD, ED (MC, WL, AP ONLY)

## 2017-10-11 LAB — LIPASE, BLOOD: Lipase: 22 U/L (ref 11–51)

## 2017-10-11 MED ORDER — PSYLLIUM 0.52 G PO CAPS: 0.5200 g | ORAL_CAPSULE | Freq: Every day | ORAL | 0 refills | Status: DC

## 2017-10-11 NOTE — ED Provider Notes (Signed)
[facility]   CSN: [id number] Arrival date & time: 10/11/17  1103     History   Chief Complaint No chief complaint on file.   HPI Nicole Mclean is a 22 y.o. female.  HPI   22 year old female presents today with complaints of rectal bleeding.  Patient notes over the last 3 weeks she has had bright red blood when using the bathroom.  She denies any history of the same.  Patient reports a history of external hemorrhoids, reports that these are painful, but the presentation she is experience and now is not painful.  Patient reports that every time she goes to the bathroom she has right red blood that resolves after standing up, no bleeding in between bowel movements.  She denies any abdominal pain, rectal pain, any other abnormal bruising bleeding.  Patient denies any rectal trauma.  Patient reports normal bowel movements with no significant constipation.  No history of bleeding disorders.  Past Medical History:  Diagnosis Date  . Chlamydia 01/2016  . Gonorrhea April 2015  . Gonorrhea   . Infection    UTI  . STD (female)   . Syphilis     Patient Active Problem List   Diagnosis Date Noted  . Lower abdominal pain 08/15/2016  . Abnormal uterine bleeding (AUB) 12/08/2015  . Postpartum examination following vaginal delivery 08/12/2015  . 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 Other iron deficiency anemias 04/30/2015    Past Surgical History:  Procedure Laterality Date  . chin surgery    . HAND SURGERY     Left  hand  . RHYTIDECTOMY NECK / CHEEK / CHIN       OB History    Gravida  3   Para  2   Term  2   Preterm  0   AB  1   Living  2     SAB  1   TAB  0   Ectopic  0   Multiple  0   Live Births  2            Home Medications    Prior to Admission medications   Medication Sig Start Date End Date Taking? Authorizing Provider  ibuprofen (ADVIL,MOTRIN) 800 MG tablet Take 1 tablet (800 mg total) by mouth  3 (three) times daily. 07/19/17   Petrucelli, Samantha R, PA-C  psyllium (METAMUCIL) 0.52 g capsule Take 1 capsule (0.52 g total) by mouth daily. 10/11/17   Eyvonne MechanicHedges, Athira Janowicz, PA-C    Family History Family History  Adopted: Yes  Problem Relation Age of Onset  . Miscarriages / [place] Mother   . Miscarriages / Stillbirths Sister   . Miscarriages / Stillbirths Maternal Grandmother   . Alcohol abuse Neg Hx   . Arthritis Neg Hx   . Asthma Neg Hx   . COPD Neg Hx   . Cancer Neg Hx   . Birth defects Neg Hx   . Depression Neg Hx   . Diabetes Neg Hx   . Drug abuse Neg Hx   . Early death Neg Hx   . Hearing loss Neg Hx   . Heart disease Neg Hx   . Hyperlipidemia Neg Hx   . Hypertension Neg Hx   . Kidney disease Neg Hx   . Learning disabilities Neg Hx   . Mental illness Neg Hx   . Mental retardation Neg Hx   . Stroke Neg Hx   . Vision loss Neg Hx   .  Varicose Veins Neg Hx   . Migraines Neg Hx     Social History Social History   Tobacco Use  . Smoking status: Former Smoker    Types: Cigars    Last attempt to quit: 08/04/2013    Years since quitting: 4.1  . Smokeless tobacco: Never Used  Substance Use Topics  . Alcohol use: No    Alcohol/week: 0.0 oz  . Drug use: No     Allergies   Patient has no known allergies.   Review of Systems Review of Systems  All other systems reviewed and are negative.  Physical Exam Updated Vital Signs BP 125/85 (BP Location: Right Arm)   Pulse 77   Temp 98.2 F (36.8 C) (Oral)   Resp 18   SpO2 100%   Physical Exam  Constitutional: She is oriented to person, place, and time. She appears well-developed and well-nourished.  HENT:  Head: Normocephalic and atraumatic.  Eyes: Pupils are equal, round, and reactive to light. Conjunctivae are normal. Right eye exhibits no discharge. Left eye exhibits no discharge. No scleral icterus.  Neck: Normal range of motion. No JVD present. No tracheal deviation present.  Pulmonary/Chest: Effort  normal. No stridor.  Abdominal: Soft. Bowel sounds are normal. She exhibits no distension and no mass. There is no tenderness. There is no rebound and no guarding. No hernia.  Neurological: She is alert and oriented to person, place, and time. Coordination normal.  Psychiatric: She has a normal mood and affect. Her behavior is normal. Judgment and thought content normal.  Nursing note and vitals reviewed.   ED Treatments / Results  Labs (all labs ordered are listed, but only abnormal results are displayed) Labs Reviewed  COMPREHENSIVE METABOLIC PANEL - Abnormal; Notable for the following components:      Result Value   Total Protein 6.2 (*)    All other components within normal limits  URINALYSIS, ROUTINE W REFLEX MICROSCOPIC - Abnormal; Notable for the following components:   Color, Urine STRAW (*)    Leukocytes, UA TRACE (*)    Squamous Epithelial / LPF 0-5 (*)    All other components within normal limits  LIPASE, BLOOD  CBC  I-STAT BETA HCG BLOOD, ED ([facility], [facility], [facility] ONLY)    EKG None  Radiology No results found.  Procedures Procedures (including critical care time)  Medications Ordered in ED Medications - No data to display   Initial Impression / Assessment and Plan / ED Course  I have reviewed the triage vital signs and the nursing notes.  Pertinent labs & imaging results that were available during my care of the patient were reviewed by me and considered in my medical decision making (see chart for details).      Final Clinical Impressions(s) / ED Diagnoses   Final diagnoses:  Rectal bleeding    Labs:   Imaging:  Consults:  Therapeutics:  Discharge Meds: metamucil   Assessment/Plan: 22 year old female presents today with complaints of rectal bleeding.  I have very high suspicion for internal hemorrhoids given these are pain-free and only with having bowel movements.  Patient without any signs of acute blood loss on her laboratory analysis, soft nontender  abdomen no other bruising or bleeding.  Low suspicion for upper GI bleed given this is red blood with no melena or dark blood.  Patient refused rectal exam, she understands that without fully evaluating the area IN unable to rule out any potential life-threatening causes other than internal hemorrhoids.  Patient will try Metamucil  at home and follow-up as an outpatient with gastroenterology.  Patient is given strict return precautions, she verbalized understanding and agreement to today's plan and had no further questions or concerns at time of discharge.    ED Discharge Orders        Ordered    psyllium (METAMUCIL) 0.52 g capsule  Daily     10/11/17 1544       Eyvonne Mechanic, PA-C 10/11/17 1545    Little, Ambrose Finland, MD 10/12/17 1510

## 2017-10-11 NOTE — Discharge Instructions (Signed)
Please read attached information. If you experience any new or worsening signs or symptoms please return to the emergency room for evaluation. Please follow-up with your primary care provider or specialist as discussed. Please use medication prescribed only as directed and discontinue taking if you have any concerning signs or symptoms.   °

## 2017-10-11 NOTE — ED Triage Notes (Signed)
Patient complains of intermittent frank rectal bleeding x 3 weeks. Reports abdominal fullness with no nausea, no vomiting.

## 2017-10-11 NOTE — ED Notes (Signed)
C/o rectal bleeding x 3 weeks, states she has bright red blood bleeding after going to the bathroom. Denies rectal pain

## 2017-12-04 ENCOUNTER — Encounter: Payer: Self-pay | Admitting: Obstetrics

## 2017-12-04 ENCOUNTER — Emergency Department (HOSPITAL_COMMUNITY)
Admission: EM | Admit: 2017-12-04 | Discharge: 2017-12-04 | Disposition: A | Discharge status: Home / Self Care | Payer: Medicaid Other | Attending: Emergency Medicine | Admitting: Emergency Medicine

## 2017-12-04 ENCOUNTER — Other Ambulatory Visit: Payer: Self-pay

## 2017-12-04 ENCOUNTER — Encounter (HOSPITAL_COMMUNITY): Payer: Self-pay | Admitting: *Deleted

## 2017-12-04 DIAGNOSIS — N12 Tubulo-interstitial nephritis, not specified as acute or chronic: Secondary | ICD-10-CM | POA: Diagnosis not present

## 2017-12-04 DIAGNOSIS — Z87891 Personal history of nicotine dependence: Secondary | ICD-10-CM | POA: Insufficient documentation

## 2017-12-04 DIAGNOSIS — Z79899 Other long term (current) drug therapy: Secondary | ICD-10-CM | POA: Insufficient documentation

## 2017-12-04 DIAGNOSIS — R300 Dysuria: Secondary | ICD-10-CM | POA: Diagnosis present

## 2017-12-04 LAB — URINALYSIS, ROUTINE W REFLEX MICROSCOPIC
Bilirubin Urine: NEGATIVE
Glucose, UA: NEGATIVE mg/dL
HGB URINE DIPSTICK: NEGATIVE
Ketones, ur: NEGATIVE mg/dL
Nitrite: POSITIVE — AB
PROTEIN: NEGATIVE mg/dL
Specific Gravity, Urine: 1.014 (ref 1.005–1.030)
pH: 7 (ref 5.0–8.0)

## 2017-12-04 LAB — PREGNANCY, URINE: PREG TEST UR: NEGATIVE

## 2017-12-04 MED ORDER — CEPHALEXIN 250 MG PO CAPS
500.0000 mg | ORAL_CAPSULE | Freq: Once | ORAL | Status: AC
  Administered 2017-12-04: 500 mg via ORAL
  Filled 2017-12-04: qty 2

## 2017-12-04 MED ORDER — CEPHALEXIN 500 MG PO CAPS: 500.0000 mg | ORAL_CAPSULE | Freq: Three times a day (TID) | ORAL | 0 refills | Status: DC

## 2017-12-04 NOTE — Discharge Instructions (Addendum)
Please read attached information. If you experience any new or worsening signs or symptoms please return to the emergency room for evaluation. Please follow-up with your primary care provider or specialist as discussed. Please use medication prescribed only as directed and discontinue taking if you have any concerning signs or symptoms.   °

## 2017-12-04 NOTE — ED Triage Notes (Signed)
Pt reports urinary frequency, burning and flank pain for 4 days

## 2017-12-04 NOTE — ED Provider Notes (Signed)
[facility] EMERGENCY DEPARTMENT Provider Note   CSN: [id number] Arrival date & time: 12/04/17  7846     History   Chief Complaint Chief Complaint  Patient presents with  . Urinary Tract Infection    HPI Nicole Mclean is a 22 y.o. female.  HPI   22 year old female presents today with complaints of urinary tract infection.  Patient reports 5 days of painful urination, odor.  She notes some suprapubic tenderness, and some left flank tenderness, she denies any nausea or vomiting, fever chills.  Patient notes she is tolerating p.o. without difficulty.  No recent antibiotics recent travel or known allergies.  Past Medical History:  Diagnosis Date  . Chlamydia 01/2016  . Gonorrhea April 2015  . Gonorrhea   . Infection    UTI  . STD (female)   . Syphilis     Patient Active Problem List   Diagnosis Date Noted  . Lower abdominal pain 08/15/2016  . Abnormal uterine bleeding (AUB) 12/08/2015  . Postpartum examination following vaginal delivery 08/12/2015  . 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 Other iron deficiency anemias 04/30/2015    Past Surgical History:  Procedure Laterality Date  . chin surgery    . HAND SURGERY     Left  hand  . RHYTIDECTOMY NECK / CHEEK / CHIN       OB History    Gravida  3   Para  2   Term  2   Preterm  0   AB  1   Living  2     SAB  1   TAB  0   Ectopic  0   Multiple  0   Live Births  2            Home Medications    Prior to Admission medications   Medication Sig Start Date End Date Taking? Authorizing Provider  cephALEXin (KEFLEX) 500 MG capsule Take 1 capsule (500 mg total) by mouth 3 (three) times daily. 12/04/17   Imaad Reuss, Tinnie Gens, PA-C  ibuprofen (ADVIL,MOTRIN) 800 MG tablet Take 1 tablet (800 mg total) by mouth 3 (three) times daily. 07/19/17   Petrucelli, Samantha R, PA-C  psyllium (METAMUCIL) 0.52 g capsule Take 1 capsule (0.52 g total) by mouth daily. 10/11/17   Eyvonne Mechanic, PA-C    Family  History Family History  Adopted: Yes  Problem Relation Age of Onset  . Miscarriages / [place] Mother   . Miscarriages / Stillbirths Sister   . Miscarriages / Stillbirths Maternal Grandmother   . Alcohol abuse Neg Hx   . Arthritis Neg Hx   . Asthma Neg Hx   . COPD Neg Hx   . Cancer Neg Hx   . Birth defects Neg Hx   . Depression Neg Hx   . Diabetes Neg Hx   . Drug abuse Neg Hx   . Early death Neg Hx   . Hearing loss Neg Hx   . Heart disease Neg Hx   . Hyperlipidemia Neg Hx   . Hypertension Neg Hx   . Kidney disease Neg Hx   . Learning disabilities Neg Hx   . Mental illness Neg Hx   . Mental retardation Neg Hx   . Stroke Neg Hx   . Vision loss Neg Hx   . Varicose Veins Neg Hx   . Migraines Neg Hx     Social History Social History   Tobacco Use  . Smoking status: Former Smoker    Types: Cigars  Last attempt to quit: 08/04/2013    Years since quitting: 4.3  . Smokeless tobacco: Never Used  Substance Use Topics  . Alcohol use: No    Alcohol/week: 0.0 oz  . Drug use: No     Allergies   Patient has no known allergies.   Review of Systems Review of Systems  All other systems reviewed and are negative.    Physical Exam Updated Vital Signs BP 136/85 (BP Location: Right Arm)   Pulse 100   Temp 98.6 F (37 C) (Oral)   Resp 16   SpO2 98%   Physical Exam  Constitutional: She is oriented to person, place, and time. She appears well-developed and well-nourished.  HENT:  Head: Normocephalic and atraumatic.  Eyes: Pupils are equal, round, and reactive to light. Conjunctivae are normal. Right eye exhibits no discharge. Left eye exhibits no discharge. No scleral icterus.  Neck: Normal range of motion. No JVD present. No tracheal deviation present.  Pulmonary/Chest: Effort normal. No stridor.  Abdominal:  Minor left-sided CVA tenderness-minor suprapubic tenderness, no right lower quadrant or any other abdominal tenderness  Neurological: She is alert and  oriented to person, place, and time. Coordination normal.  Psychiatric: She has a normal mood and affect. Her behavior is normal. Judgment and thought content normal.  Nursing note and vitals reviewed.    ED Treatments / Results  Labs (all labs ordered are listed, but only abnormal results are displayed) Labs Reviewed  URINALYSIS, ROUTINE W REFLEX MICROSCOPIC - Abnormal; Notable for the following components:      Result Value   Color, Urine AMBER (*)    Nitrite POSITIVE (*)    Leukocytes, UA MODERATE (*)    WBC, UA >50 (*)    Bacteria, UA FEW (*)    All other components within normal limits  URINE CULTURE  PREGNANCY, URINE    EKG None  Radiology No results found.  Procedures Procedures (including critical care time)  Medications Ordered in ED Medications  cephALEXin (KEFLEX) capsule 500 mg (has no administration in time range)     Initial Impression / Assessment and Plan / ED Course  I have reviewed the triage vital signs and the nursing notes.  Pertinent labs & imaging results that were available during my care of the patient were reviewed by me and considered in my medical decision making (see chart for details).     22 year old female presents today with urinary tract infection.  Patient does have flank tenderness consistent with pyelonephritis.  She is well-appearing in no acute distress.  Patient does have urine consistent with urinary tract infection.  Patient will be treated for pyelonephritis, consultation with ED pharmacist with recommendations of Keflex.  Patient discharged with Keflex strict return precautions, she verbalized understanding and agreement to today's plan had no further questions or concerns at the time of discharge.  Final Clinical Impressions(s) / ED Diagnoses   Final diagnoses:  Pyelonephritis    ED Discharge Orders        Ordered    cephALEXin (KEFLEX) 500 MG capsule  3 times daily     12/04/17 1313       HedgesTinnie Gens,  PA-C 12/04/17 1313    Shaune Pollack, MD 12/04/17 1322

## 2017-12-06 ENCOUNTER — Ambulatory Visit: Payer: Medicaid Other | Admitting: Obstetrics

## 2017-12-06 LAB — URINE CULTURE: Culture: >=100000 — AB

## 2017-12-07 ENCOUNTER — Telehealth: Payer: Self-pay | Admitting: *Deleted

## 2017-12-07 NOTE — Telephone Encounter (Signed)
Post ED Visit - Positive Culture Follow-up: Successful Patient Follow-Up  Culture assessed and recommendations reviewed by:  Enzo Bi, Pharm.D.  Celedonio Miyamoto, [address].D., BCPS AQ-ID  Garvin Fila, Pharm.D., BCPS  Georgina Pillion, Pharm.D., BCPS  [place], [address].D., BCPS, AAHIVP  Estella Husk, Pharm.D., BCPS, AAHIVP  Lysle Pearl, PharmD, BCPS  Sherlynn Carbon, PharmD  Pollyann Samples, PharmD, BCPS  Positive urine culture   Patient discharged without antimicrobial prescription and treatment is now indicated  Organism is resistant to prescribed ED discharge antimicrobial  Patient with positive blood cultures  Changes discussed with ED provider: Terance Hart, PA-C New antibiotic prescription Bactrim DS 1 tab O BID x 7 days., stop Cephalexin Called to [facility] [phone]  Contacted patient, date 12/07/2017, time 1245   Lysle Pearl 12/07/2017, 12:48 PM

## 2017-12-07 NOTE — Progress Notes (Signed)
ED Antimicrobial Stewardship Positive Culture Follow Up   Nicole Mclean is an 22 y.o. female who presented to [facility] on 12/04/2017 with a chief complaint of  Chief Complaint  Patient presents with  . Urinary Tract Infection    Recent Results (from the past 720 hour(s))  Urine culture     Status: Abnormal   Collection Time: 12/04/17 12:57 PM  Result Value Ref Range Status   Specimen Description URINE, CLEAN CATCH  Final   Special Requests   Final    NONE Performed at [facility], [address], [place], [place] [address]    Culture >=100,000 COLONIES/mL STAPHYLOCOCCUS SAPROPHYTICUS (A)  Final   Report Status 12/06/2017 FINAL  Final   Organism ID, Bacteria STAPHYLOCOCCUS SAPROPHYTICUS (A)  Final      Susceptibility   Staphylococcus saprophyticus - MIC*    CIPROFLOXACIN <=0.5 SENSITIVE Sensitive     GENTAMICIN <=0.5 SENSITIVE Sensitive     NITROFURANTOIN <=16 SENSITIVE Sensitive     OXACILLIN 1 RESISTANT Resistant     TETRACYCLINE <=1 SENSITIVE Sensitive     VANCOMYCIN 1 SENSITIVE Sensitive     TRIMETH/SULFA <=10 SENSITIVE Sensitive     CLINDAMYCIN <=0.25 SENSITIVE Sensitive     RIFAMPIN <=0.5 SENSITIVE Sensitive     Inducible Clindamycin NEGATIVE Sensitive     * >=100,000 COLONIES/mL STAPHYLOCOCCUS SAPROPHYTICUS     Treated with cephalexin, organism resistant to prescribed antimicrobial  Patient discharged originally without antimicrobial agent and treatment is now indicated  New antibiotic prescription:  Stop cephalexin  Start Bactrim DS- 1 tablet BID X 7 days  ED Provider: Terance Hart, PA-C   Della Goo 12/07/2017, 11:12 AM Infectious Diseases Pharmacy Resident  Phone# [phone]

## 2017-12-07 NOTE — Telephone Encounter (Signed)
Post ED Visit - Positive Culture Follow-up: Successful Patient Follow-Up  Culture assessed and recommendations reviewed by:  Enzo Bi, Pharm.D.  Celedonio Miyamoto, Pharm.D., BCPS AQ-ID  Garvin Fila, Pharm.D., BCPS  Georgina Pillion, Pharm.D., BCPS  [place], [address].D., BCPS, AAHIVP  Estella Husk, Pharm.D., BCPS, AAHIVP  Lysle Pearl, PharmD, BCPS  Sherlynn Carbon, PharmD  Pollyann Samples, PharmD, BCPS  Positive urine culture   Patient discharged without antimicrobial prescription and treatment is now indicated  Organism is resistant to prescribed ED discharge antimicrobial  Patient with positive blood cultures  Changes discussed with ED provider Kallie Edward, PA-C New antibiotic prescription Cephalexin  PO BID x 7 days Called to [facility], [address] [facility] [phone]  Contacted patient, date 12/07/2017, time 1230   Lysle Pearl 12/07/2017, 12:32 PM

## 2017-12-20 ENCOUNTER — Encounter (HOSPITAL_COMMUNITY): Payer: Self-pay

## 2017-12-20 ENCOUNTER — Inpatient Hospital Stay (HOSPITAL_COMMUNITY)
Admission: AD | Admit: 2017-12-20 | Discharge: 2017-12-20 | Disposition: A | Discharge status: Home / Self Care | Payer: Medicaid Other | Source: Ambulatory Visit | Attending: Obstetrics and Gynecology | Admitting: Obstetrics and Gynecology

## 2017-12-20 ENCOUNTER — Other Ambulatory Visit: Payer: Self-pay

## 2017-12-20 DIAGNOSIS — M545 Low back pain: Secondary | ICD-10-CM | POA: Diagnosis present

## 2017-12-20 DIAGNOSIS — N912 Amenorrhea, unspecified: Secondary | ICD-10-CM | POA: Diagnosis not present

## 2017-12-20 DIAGNOSIS — N926 Irregular menstruation, unspecified: Secondary | ICD-10-CM

## 2017-12-20 DIAGNOSIS — N760 Acute vaginitis: Secondary | ICD-10-CM | POA: Diagnosis not present

## 2017-12-20 DIAGNOSIS — Z87891 Personal history of nicotine dependence: Secondary | ICD-10-CM | POA: Diagnosis not present

## 2017-12-20 DIAGNOSIS — B9689 Other specified bacterial agents as the cause of diseases classified elsewhere: Secondary | ICD-10-CM | POA: Diagnosis not present

## 2017-12-20 DIAGNOSIS — R5383 Other fatigue: Secondary | ICD-10-CM | POA: Insufficient documentation

## 2017-12-20 DIAGNOSIS — N390 Urinary tract infection, site not specified: Secondary | ICD-10-CM | POA: Diagnosis not present

## 2017-12-20 DIAGNOSIS — R350 Frequency of micturition: Secondary | ICD-10-CM | POA: Diagnosis present

## 2017-12-20 HISTORY — DX: Urinary tract infection, site not specified: N39.0

## 2017-12-20 LAB — POCT PREGNANCY, URINE: Preg Test, Ur: NEGATIVE

## 2017-12-20 LAB — URINALYSIS, ROUTINE W REFLEX MICROSCOPIC
Bilirubin Urine: NEGATIVE
Glucose, UA: NEGATIVE mg/dL
Hgb urine dipstick: NEGATIVE
KETONES UR: NEGATIVE mg/dL
Nitrite: NEGATIVE
PH: 6 (ref 5.0–8.0)
Protein, ur: NEGATIVE mg/dL
Specific Gravity, Urine: 1.023 (ref 1.005–1.030)
WBC, UA: >50 WBC/hpf — ABNORMAL HIGH (ref 0–5)

## 2017-12-20 LAB — WET PREP, GENITAL
Sperm: NONE SEEN
Trich, Wet Prep: NONE SEEN
Yeast Wet Prep HPF POC: NONE SEEN

## 2017-12-20 MED ORDER — SULFAMETHOXAZOLE-TRIMETHOPRIM 800-160 MG PO TABS: 1.0000 | ORAL_TABLET | Freq: Two times a day (BID) | ORAL | 0 refills | Status: DC

## 2017-12-20 MED ORDER — METRONIDAZOLE 500 MG PO TABS: 500.0000 mg | ORAL_TABLET | Freq: Two times a day (BID) | ORAL | 0 refills | Status: DC

## 2017-12-20 NOTE — MAU Note (Signed)
Had a UTI about 2 to 3 wks ago, thinks it has come back. Had some spotting with the first one.  Then spotted a few days before her cycle was due.  Never got her period. Frequency of urination, not painful.  Feels restless, very tired. Gave plasma a few days ago, might have something to do with that. Minor back pain.

## 2017-12-20 NOTE — Discharge Instructions (Signed)
Acute Urinary Retention, Female Urinary retention means you are unable to pee completely or at all (empty your bladder). Follow these instructions at home:  Drink enough fluids to keep your pee (urine) clear or pale yellow.  If you are sent home with a tube that drains the bladder (catheter), there will be a drainage bag attached to it. There are two types of bags. One is big that you can wear at night without having to empty it. One is smaller and needs to be emptied more often.  Keep the drainage bag emptied.  Keep the drainage bag lower than the tube.  Only take medicine as told by your doctor. Contact a doctor if:  You have a low-grade fever.  You have spasms or you are leaking pee when you have spasms. Get help right away if:  You have chills or a fever.  Your catheter stops draining pee.  Your catheter falls out.  You have increased bleeding that does not stop after you have rested and increased the amount of fluids you had been drinking. This information is not intended to replace advice given to you by your health care provider. Make sure you discuss any questions you have with your health care provider. Document Released: 12/21/2007 Document Revised: 12/10/2015 Document Reviewed: 12/13/2012 Elsevier Interactive Patient Education  2017 Elsevier Inc.  

## 2017-12-20 NOTE — MAU Provider Note (Signed)
History     CSN: [id number]  Arrival date and time: 12/20/17 1816   First Provider Initiated Contact with Patient 12/20/17 1941      Chief Complaint  Patient presents with  . Urinary Frequency  . Fatigue  . Back Pain   HPI    Ms.Lurlie E Para MarchDuncan is a 22 y.o. female [phone] here with urinary frequency, fatigue and back pain. Symptoms began 1 week ago. 1 sexual partner and is no longer with that person, however did have intercourse with this person 2-3 weeks ago; did not use condoms. Says her period was due to come on today however it did not. Says she is regular every month. Says her back pain is lower; all along her lower back. The pain comes and goes. She denies abdominal pain.She has not taken any medication for the pain.   OB History    Gravida  3   Para  2   Term  2   Preterm  0   AB  1   Living  2     SAB  1   TAB  0   Ectopic  0   Multiple  0   Live Births  2           Past Medical History:  Diagnosis Date  . Chlamydia 01/2016  . Gonorrhea April 2015  . Gonorrhea   . Infection    UTI  . STD (female)   . Syphilis   . UTI (urinary tract infection)     Past Surgical History:  Procedure Laterality Date  . chin surgery    . HAND SURGERY     Left  hand  . RHYTIDECTOMY NECK / CHEEK / CHIN      Family History  Adopted: Yes  Problem Relation Age of Onset  . Miscarriages / [place] Mother   . Miscarriages / Stillbirths Sister   . Miscarriages / Stillbirths Maternal Grandmother   . Alcohol abuse Neg Hx   . Arthritis Neg Hx   . Asthma Neg Hx   . COPD Neg Hx   . Cancer Neg Hx   . Birth defects Neg Hx   . Depression Neg Hx   . Diabetes Neg Hx   . Drug abuse Neg Hx   . Early death Neg Hx   . Hearing loss Neg Hx   . Heart disease Neg Hx   . Hyperlipidemia Neg Hx   . Hypertension Neg Hx   . Kidney disease Neg Hx   . Learning disabilities Neg Hx   . Mental illness Neg Hx   . Mental retardation Neg Hx   . Stroke Neg Hx   . Vision loss  Neg Hx   . Varicose Veins Neg Hx   . Migraines Neg Hx     Social History   Tobacco Use  . Smoking status: Former Smoker    Types: Cigars    Last attempt to quit: 08/04/2013    Years since quitting: 4.3  . Smokeless tobacco: Never Used  Substance Use Topics  . Alcohol use: No    Alcohol/week: 0.0 oz  . Drug use: No    Allergies: No Known Allergies  Medications Prior to Admission  Medication Sig Dispense Refill Last Dose  . cephALEXin (KEFLEX) 500 MG capsule Take 1 capsule (500 mg total) by mouth 3 (three) times daily. 30 capsule 0   . ibuprofen (ADVIL,MOTRIN) 800 MG tablet Take 1 tablet (800 mg total) by mouth 3 (three) times daily.  21 tablet 0   . psyllium (METAMUCIL) 0.52 g capsule Take 1 capsule (0.52 g total) by mouth daily. 30 capsule 0     Results for orders placed or performed during the hospital encounter of 12/20/17 (from the past 48 hour(s))  Urinalysis, Routine w reflex microscopic     Status: Abnormal   Collection Time: 12/20/17  6:27 PM  Result Value Ref Range   Color, Urine YELLOW YELLOW   APPearance HAZY (A) CLEAR   Specific Gravity, Urine 1.023 1.005 - 1.030   pH 6.0 5.0 - 8.0   Glucose, UA NEGATIVE NEGATIVE mg/dL   Hgb urine dipstick NEGATIVE NEGATIVE   Bilirubin Urine NEGATIVE NEGATIVE   Ketones, ur NEGATIVE NEGATIVE mg/dL   Protein, ur NEGATIVE NEGATIVE mg/dL   Nitrite NEGATIVE NEGATIVE   Leukocytes, UA MODERATE (A) NEGATIVE   RBC / HPF 0-5 0 - 5 RBC/hpf   WBC, UA >50 (H) 0 - 5 WBC/hpf   Bacteria, UA RARE (A) NONE SEEN   Squamous Epithelial / LPF 0-5 0 - 5   Mucus PRESENT     Comment: Performed at [facility], [address], [place], [place] [address]  Pregnancy, urine POC     Status: None   Collection Time: 12/20/17  6:42 PM  Result Value Ref Range   Preg Test, Ur NEGATIVE NEGATIVE    Comment:        THE SENSITIVITY OF THIS METHODOLOGY IS >24 mIU/mL   Wet prep, genital     Status: Abnormal   Collection Time: 12/20/17  7:23 PM  Result  Value Ref Range   Yeast Wet Prep HPF POC NONE SEEN NONE SEEN   Trich, Wet Prep NONE SEEN NONE SEEN   Clue Cells Wet Prep HPF POC PRESENT (A) NONE SEEN   WBC, Wet Prep HPF POC MANY (A) NONE SEEN    Comment: FEW BACTERIA SEEN   Sperm NONE SEEN     Comment: Performed at [facility], [address] [place], [place] [address]   Review of Systems  Gastrointestinal: Positive for nausea. Negative for vomiting.  Genitourinary: Positive for frequency and urgency. Negative for dysuria.   Physical Exam   Blood pressure 131/86, pulse 79, temperature 98.9 F (37.2 C), resp. rate 16, weight 213 lb 4 oz (96.7 kg), last menstrual period 11/19/2017, SpO2 100 %.  Physical Exam  Constitutional: She is oriented to person, place, and time. She appears well-developed and well-nourished. No distress.  GI: Soft. Normal appearance. There is no tenderness. There is no CVA tenderness.  Musculoskeletal: Normal range of motion.  Neurological: She is alert and oriented to person, place, and time.  Skin: Skin is warm. She is not diaphoretic.  Psychiatric: Her behavior is normal.   MAU Course  Procedures  None  MDM  Wet prep and GC collected by RN UA collected   Assessment and Plan   A:  Urinary tract infection without hematuria, site unspecified  Missed menses  BV (bacterial vaginosis)   P:  Discharge home in stable condition RX: Bactrim, Flagyl  Follow up with PCP and possible urology consult Always wear condoms Return to MAU if symptoms worsen   Jyasia Markoff, Harolyn Rutherford, NP 12/21/2017 9:56 AM

## 2017-12-21 LAB — GC/CHLAMYDIA PROBE AMP (~~LOC~~) NOT AT ARMC
Chlamydia: POSITIVE — AB
Neisseria Gonorrhea: POSITIVE — AB

## 2018-01-01 ENCOUNTER — Ambulatory Visit (INDEPENDENT_AMBULATORY_CARE_PROVIDER_SITE_OTHER): Payer: Medicaid Other

## 2018-01-01 VITALS — Wt 212.0 lb

## 2018-01-01 DIAGNOSIS — A749 Chlamydial infection, unspecified: Secondary | ICD-10-CM

## 2018-01-01 DIAGNOSIS — A549 Gonococcal infection, unspecified: Secondary | ICD-10-CM | POA: Diagnosis not present

## 2018-01-01 MED ORDER — CEFTRIAXONE SODIUM 250 MG IJ SOLR
250.0000 mg | Freq: Once | INTRAMUSCULAR | Status: AC
  Administered 2018-01-01: 250 mg via INTRAMUSCULAR

## 2018-01-01 MED ORDER — AZITHROMYCIN 250 MG PO TABS
1000.0000 mg | ORAL_TABLET | Freq: Once | ORAL | Status: AC
  Administered 2018-01-01: 1000 mg via ORAL

## 2018-01-01 NOTE — Progress Notes (Signed)
Patient here for STD treatment. Rocephin and zithromax given.

## 2018-01-31 IMAGING — US US TRANSVAGINAL NON-OB
1 series · 14 of 25 positions shown · non-contrast
Comparison: Pelvic ultrasound 01/27/2016.

CLINICAL DATA: Patient with right lower quadrant pain for 1 day.
Nausea and vomiting.

EXAM:
TRANSABDOMINAL AND TRANSVAGINAL ULTRASOUND OF PELVIS
TECHNIQUE: Both transabdominal and transvaginal ultrasound examinations of the
pelvis were performed. Transabdominal technique was performed for
global imaging of the pelvis including uterus, ovaries, adnexal
regions, and pelvic cul-de-sac. It was necessary to proceed with
endovaginal exam following the transabdominal exam to visualize the
adnexal structures.

[Series 1: us transvaginal non-ob · 0.15mm/px · 14 of 44 slices shown]
[im 1/44]
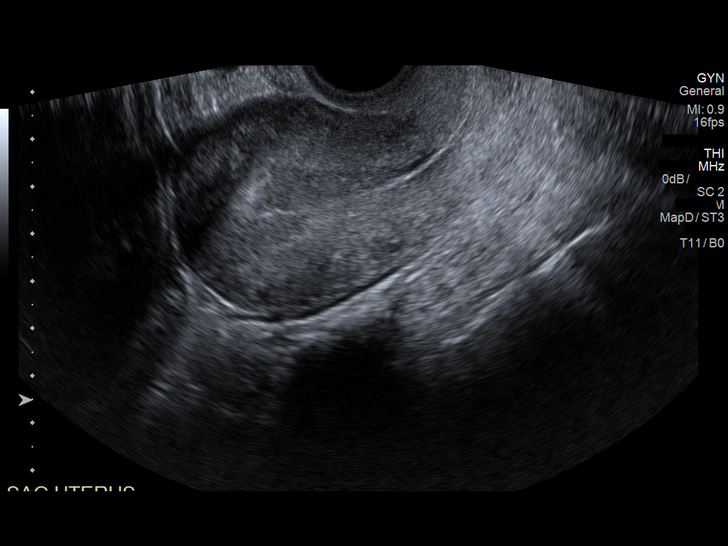
[im 4/44]
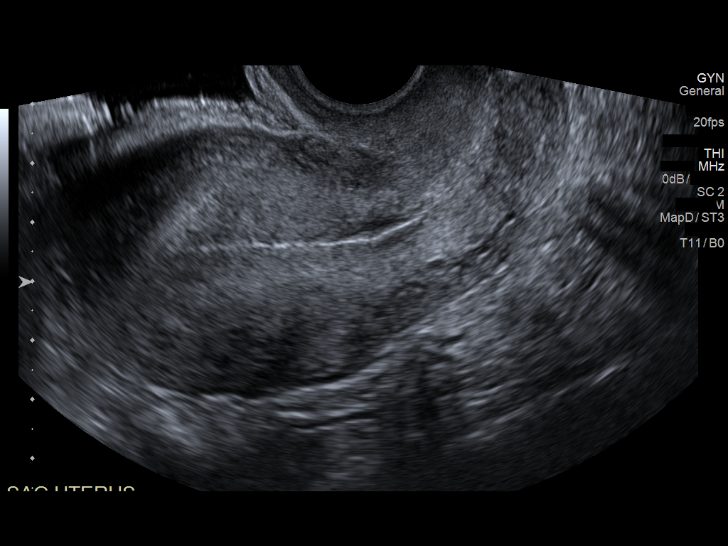
[im 8/44]
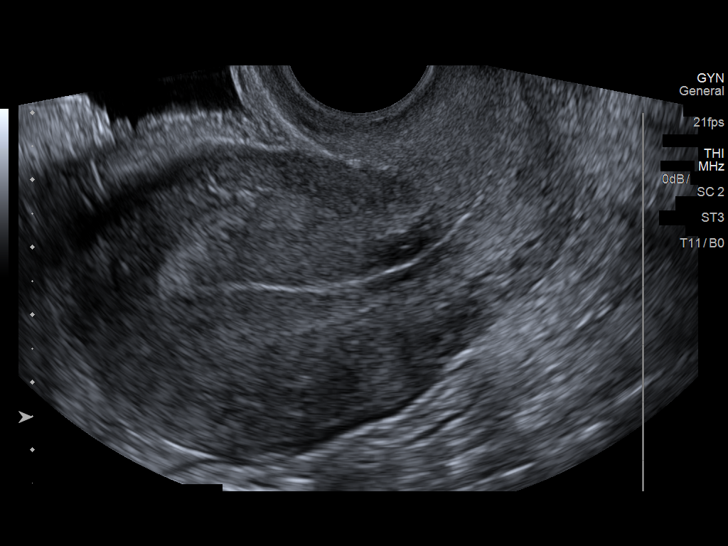
[im 11/44]
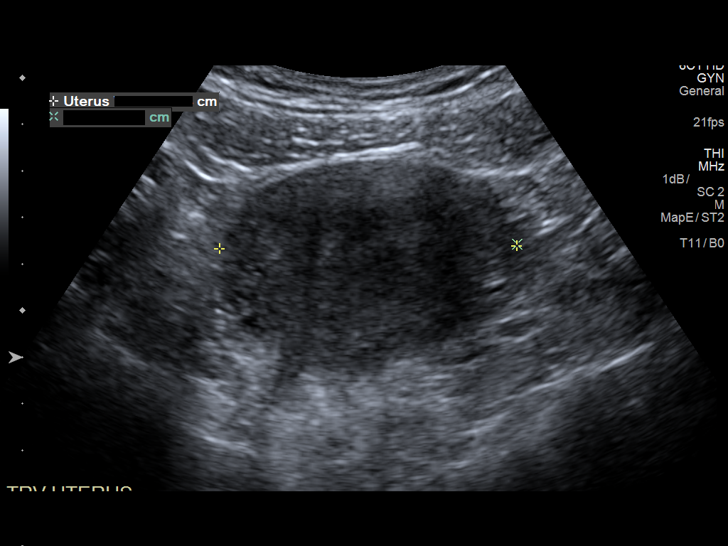
[im 15/44]
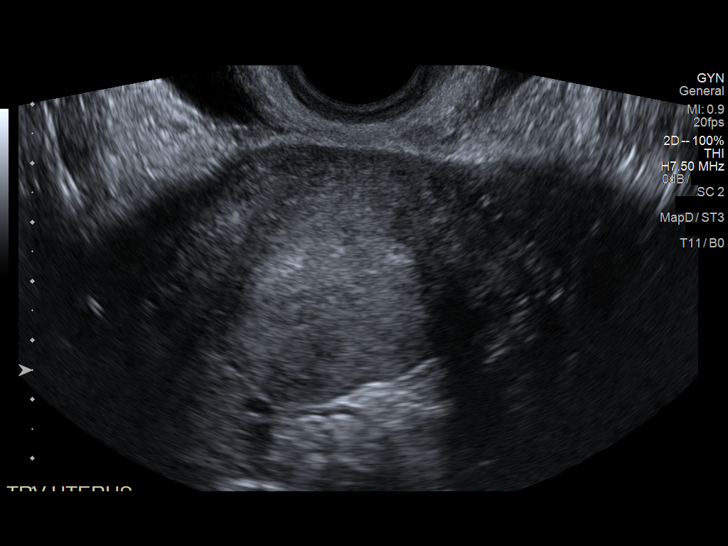
[im 17/44]
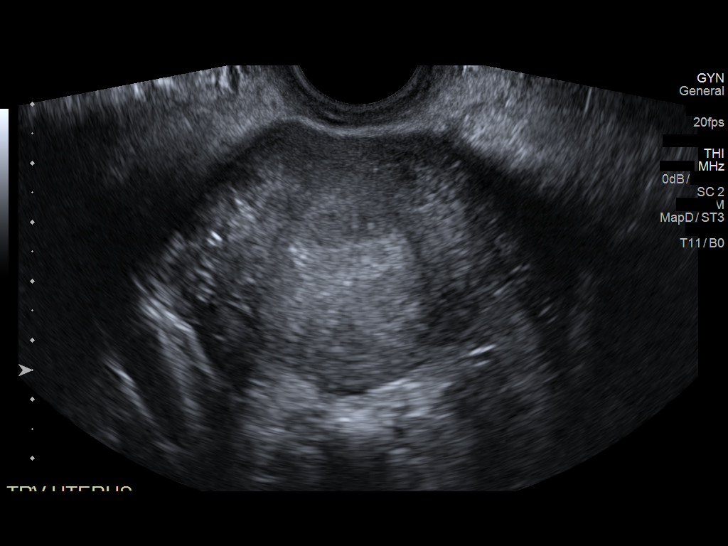
[im 20/44]
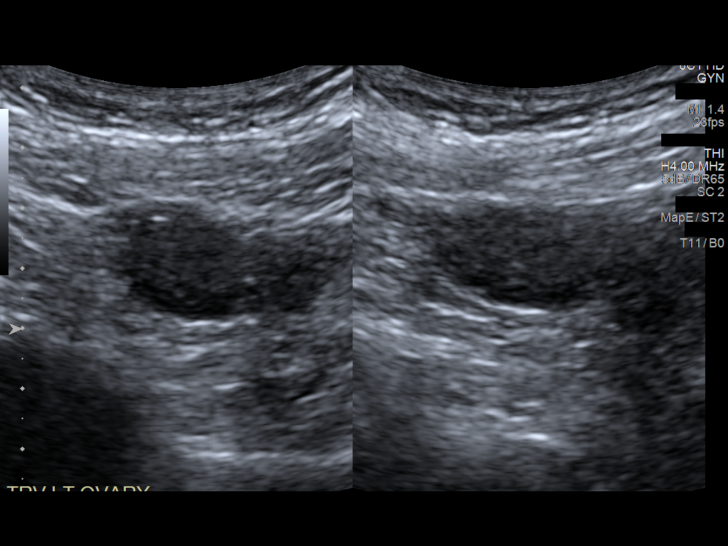
[im 24/44]
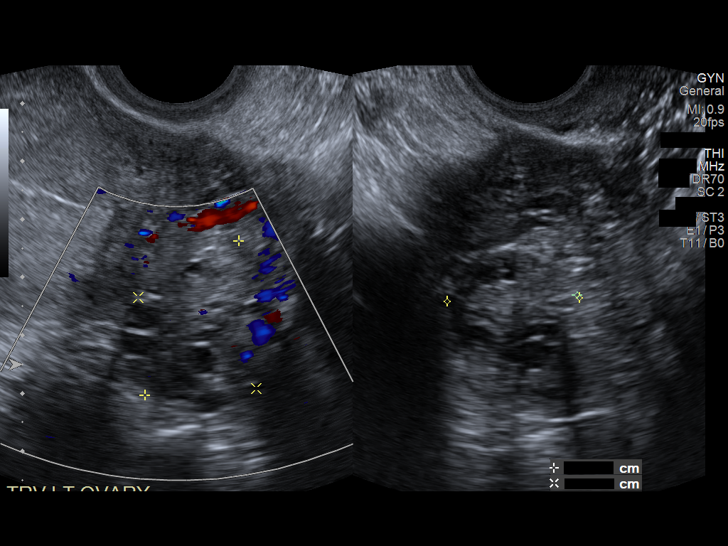
[im 27/44]
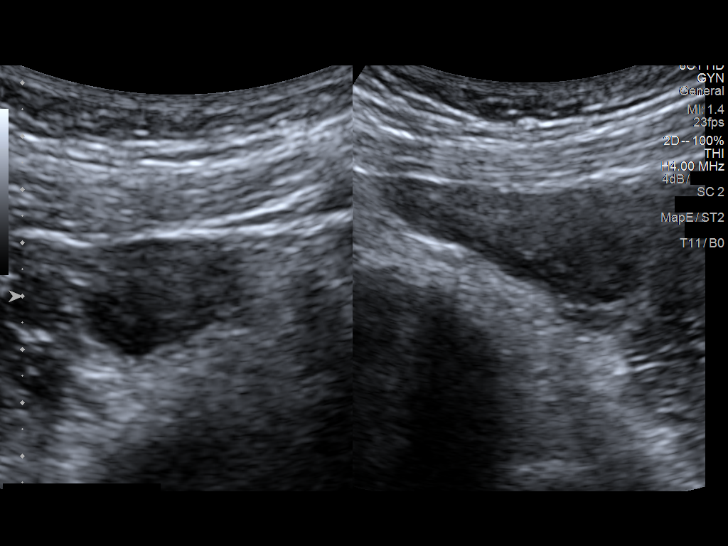
[im 29/44]
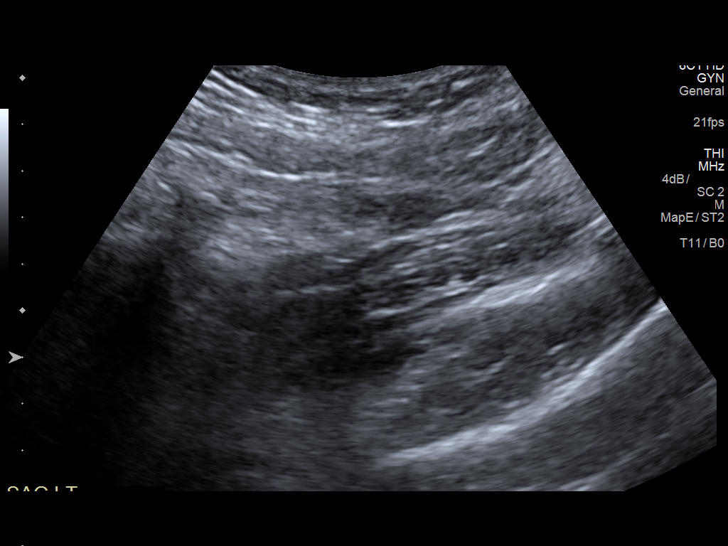
[im 33/44]
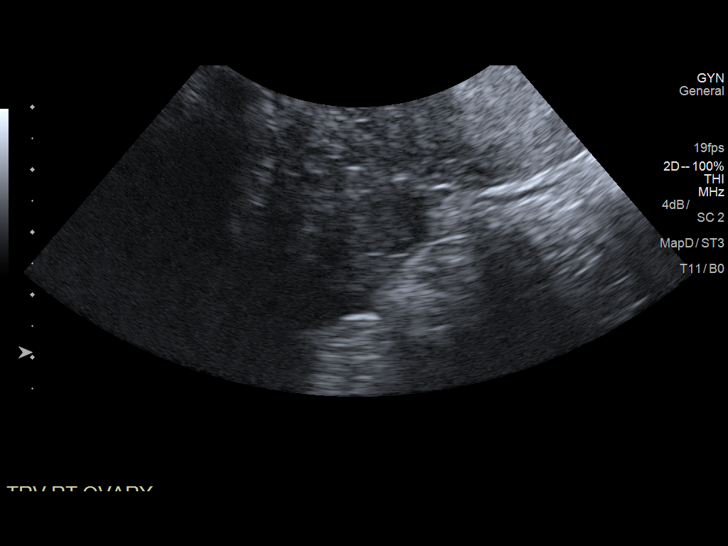
[im 36/44]
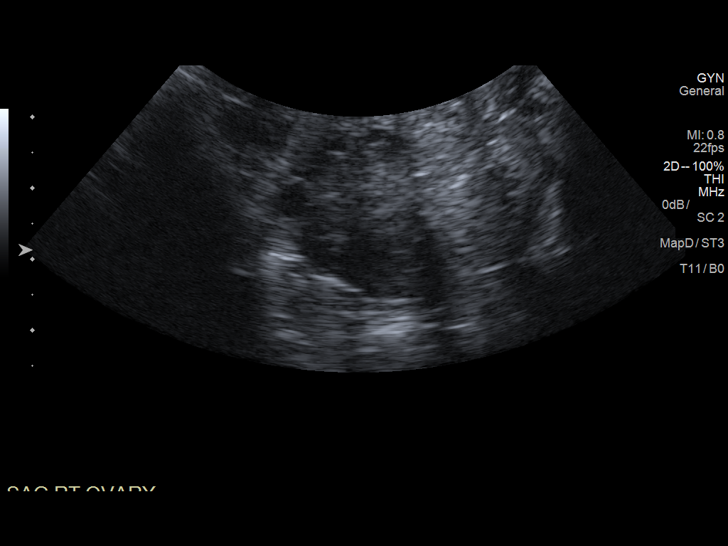
[im 40/44]
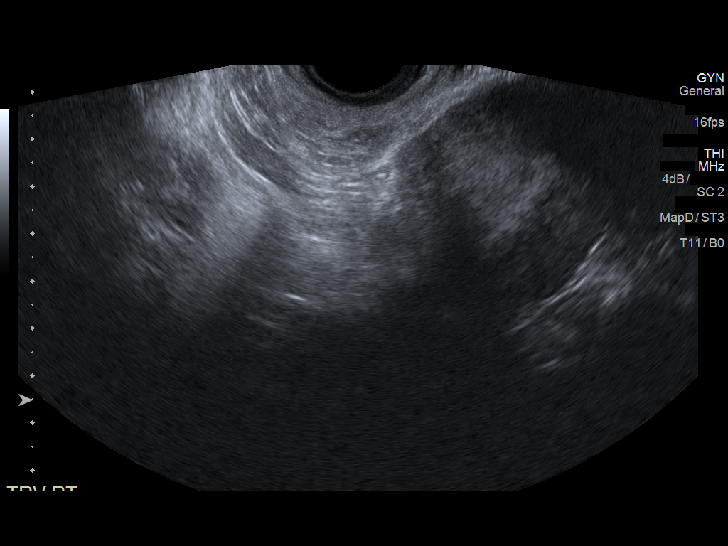
[im 44/44]
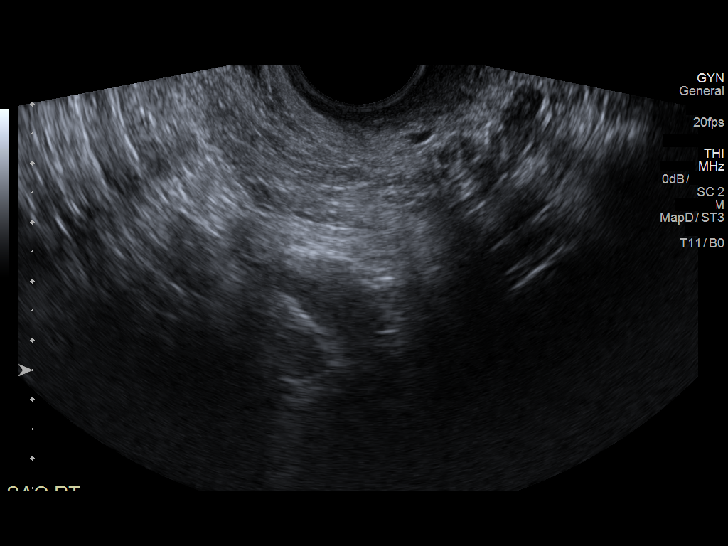

[14 of 25 positions shown; findings below may reference images not displayed]

FINDINGS: Uterus

Measurements: 8.9 x 4.6 x 6.0 cm. No fibroids or other mass
visualized.

Endometrium

Thickness: 2 mm.  No focal abnormality visualized.

Right ovary

Measurements: 2.5 x 1.7 x 1.8 cm. Normal appearance/no adnexal mass.

Left ovary

Measurements: 3.1 x 2.6 x 2.3 cm. Normal appearance/no adnexal mass.

Other findings

No abnormal free fluid.
IMPRESSION: Normal sonographic appearance of the uterus and ovaries.

## 2018-06-09 ENCOUNTER — Emergency Department (HOSPITAL_COMMUNITY)
Admission: EM | Admit: 2018-06-09 | Discharge: 2018-06-09 | Disposition: A | Discharge status: Home / Self Care | Payer: Managed Care, Other (non HMO) | Attending: Emergency Medicine | Admitting: Emergency Medicine

## 2018-06-09 ENCOUNTER — Other Ambulatory Visit: Payer: Self-pay

## 2018-06-09 ENCOUNTER — Encounter (HOSPITAL_COMMUNITY): Payer: Self-pay | Admitting: *Deleted

## 2018-06-09 DIAGNOSIS — R110 Nausea: Secondary | ICD-10-CM

## 2018-06-09 DIAGNOSIS — R5383 Other fatigue: Secondary | ICD-10-CM

## 2018-06-09 DIAGNOSIS — R42 Dizziness and giddiness: Secondary | ICD-10-CM | POA: Insufficient documentation

## 2018-06-09 DIAGNOSIS — Z87891 Personal history of nicotine dependence: Secondary | ICD-10-CM | POA: Insufficient documentation

## 2018-06-09 LAB — COMPREHENSIVE METABOLIC PANEL
ALK PHOS: 63 U/L (ref 38–126)
ALT: 23 U/L (ref 0–44)
ANION GAP: 7 (ref 5–15)
AST: 17 U/L (ref 15–41)
Albumin: 3.8 g/dL (ref 3.5–5.0)
BUN: 8 mg/dL (ref 6–20)
CO2: 25 mmol/L (ref 22–32)
Calcium: 9.4 mg/dL (ref 8.9–10.3)
Chloride: 107 mmol/L (ref 98–111)
Creatinine, Ser: 0.66 mg/dL (ref 0.44–1.00)
GFR calc Af Amer: >60 mL/min (ref >60)
GFR calc non Af Amer: >60 mL/min (ref >60)
GLUCOSE: 93 mg/dL (ref 70–99)
POTASSIUM: 3.8 mmol/L (ref 3.5–5.1)
Sodium: 139 mmol/L (ref 135–145)
Total Bilirubin: 0.2 mg/dL — ABNORMAL LOW (ref 0.3–1.2)
Total Protein: 7.1 g/dL (ref 6.5–8.1)

## 2018-06-09 LAB — URINALYSIS, ROUTINE W REFLEX MICROSCOPIC
Bacteria, UA: NONE SEEN
Bilirubin Urine: NEGATIVE
GLUCOSE, UA: NEGATIVE mg/dL
Hgb urine dipstick: NEGATIVE
KETONES UR: NEGATIVE mg/dL
Nitrite: NEGATIVE
PH: 6 (ref 5.0–8.0)
Protein, ur: NEGATIVE mg/dL
Specific Gravity, Urine: 1.021 (ref 1.005–1.030)

## 2018-06-09 LAB — CBC WITH DIFFERENTIAL/PLATELET
ABS IMMATURE GRANULOCYTES: 0.02 10*3/uL (ref 0.00–0.07)
BASOS ABS: 0.1 10*3/uL (ref 0.0–0.1)
Basophils Relative: 1 %
Eosinophils Absolute: 0.1 10*3/uL (ref 0.0–0.5)
Eosinophils Relative: 1 %
HEMATOCRIT: 39.6 % (ref 36.0–46.0)
HEMOGLOBIN: 12.4 g/dL (ref 12.0–15.0)
IMMATURE GRANULOCYTES: 0 %
LYMPHS PCT: 29 %
Lymphs Abs: 2.7 10*3/uL (ref 0.7–4.0)
MCH: 28 pg (ref 26.0–34.0)
MCHC: 31.3 g/dL (ref 30.0–36.0)
MCV: 89.4 fL (ref 80.0–100.0)
Monocytes Absolute: 0.6 10*3/uL (ref 0.1–1.0)
Monocytes Relative: 6 %
NEUTROS PCT: 63 %
NRBC: 0 % (ref 0.0–0.2)
Neutro Abs: 5.9 10*3/uL (ref 1.7–7.7)
Platelets: 317 10*3/uL (ref 150–400)
RBC: 4.43 MIL/uL (ref 3.87–5.11)
RDW: 12.4 % (ref 11.5–15.5)
WBC: 9.4 10*3/uL (ref 4.0–10.5)

## 2018-06-09 LAB — I-STAT BETA HCG BLOOD, ED (MC, WL, AP ONLY): I-stat hCG, quantitative: <5 m[IU]/mL (ref <5)

## 2018-06-09 LAB — LIPASE, BLOOD: Lipase: 23 U/L (ref 11–51)

## 2018-06-09 MED ORDER — SODIUM CHLORIDE 0.9 % IV BOLUS
1000.0000 mL | Freq: Once | INTRAVENOUS | Status: AC
  Administered 2018-06-09: 1000 mL via INTRAVENOUS

## 2018-06-09 MED ORDER — ONDANSETRON 4 MG PO TBDP: ORAL_TABLET | ORAL | 0 refills | Status: DC

## 2018-06-09 MED ORDER — ONDANSETRON HCL 4 MG/2ML IJ SOLN
4.0000 mg | Freq: Once | INTRAMUSCULAR | Status: AC
  Administered 2018-06-09: 4 mg via INTRAVENOUS
  Filled 2018-06-09: qty 2

## 2018-06-09 NOTE — ED Notes (Signed)
Declined W/C at D/C and was escorted to lobby by RN. 

## 2018-06-09 NOTE — ED Triage Notes (Signed)
Pt requested a preg. testbecause of vausea . Pt also reports her son is getting over the flu.

## 2018-06-09 NOTE — Discharge Instructions (Signed)
Evaluation today is reassuring, your labs overall look good, your symptoms could be due to dehydration, you may have had a virus that caused her fatigue and nausea.  You may use Zofran as needed for nausea at home.  Make sure you are drinking plenty of fluids, eating well and getting enough rest.  If symptoms persist is very important that you follow-up with primary care provider.  Your pregnancy test was negative today but as we discussed it may be a bit early for this given that your.  Is only 2 days late, if your period does not come over the next week I would recommend taking another at home pregnancy test.  Return to the emergency department for fevers, abdominal pain, persistent vomiting, chest pain or shortness of breath if you feel as though you are going to pass out or any other new or concerning symptoms occur.

## 2018-06-09 NOTE — ED Provider Notes (Signed)
[facility] EMERGENCY DEPARTMENT Provider Note   CSN: [id number] Arrival date & time: 06/09/18  1302     History   Chief Complaint Chief Complaint  Patient presents with  . Nausea    HPI Nicole Mclean is a 22 y.o. female.  Nicole Mclean is a 22 y.o. Female who is otherwise healthy, presents to the emergency department for evaluation of nausea and fatigue.  She reports symptoms have been present throughout the past week.  She reports her son was diagnosed with the flu last week and is currently improving, but aside from nausea and fatigue she has not had any fevers, chills, cough, sore throat, rhinorrhea or nasal congestion.  She denies any myalgias or body aches.  She reports she is just felt extremely tired throughout the week and has felt very nauseated with some decreased appetite.  She has not had any vomiting, reports some mild intermittent abdominal cramping but no focal abdominal pain.  No diarrhea, constipation, melena or hematochezia.  She denies any dysuria or urinary frequency, no hematuria or flank pain.  Denies any vaginal discharge or vaginal bleeding.  Reports that her last menstrual period was on 10/23, and her current menstrual period is 2 days late which is very atypical for her, she does express some concern that she could be pregnant, has not taken a pregnancy test at home.     Past Medical History:  Diagnosis Date  . Chlamydia 01/2016  . Gonorrhea April 2015  . Gonorrhea   . Infection    UTI  . STD (female)   . Syphilis   . UTI (urinary tract infection)     Patient Active Problem List   Diagnosis Date Noted  . Lower abdominal pain 08/15/2016  . Abnormal uterine bleeding (AUB) 12/08/2015  . Postpartum examination following vaginal delivery 08/12/2015  . 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 Other iron deficiency anemias 04/30/2015    Past Surgical History:  Procedure Laterality Date  . chin surgery    . HAND SURGERY     Left  hand  .  RHYTIDECTOMY NECK / CHEEK / CHIN       OB History    Gravida  3   Para  2   Term  2   Preterm  0   AB  1   Living  2     SAB  1   TAB  0   Ectopic  0   Multiple  0   Live Births  2            Home Medications    Prior to Admission medications   Medication Sig Start Date End Date Taking? Authorizing Provider  ibuprofen (ADVIL,MOTRIN) 800 MG tablet Take 1 tablet (800 mg total) by mouth 3 (three) times daily. 07/19/17   Petrucelli, Samantha R, PA-C  ondansetron (ZOFRAN ODT) 4 MG disintegrating tablet 4mg  ODT q4 hours prn nausea/vomit 06/09/18   Dartha Lodge, PA-C  psyllium (METAMUCIL) 0.52 g capsule Take 1 capsule (0.52 g total) by mouth daily. 10/11/17   Hedges, Tinnie Gens, PA-C  sulfamethoxazole-trimethoprim (BACTRIM DS,SEPTRA DS) 800-160 MG tablet Take 1 tablet by mouth 2 (two) times daily. 12/20/17   Rasch, Harolyn Rutherford, NP    Family History Family History  Adopted: Yes  Problem Relation Age of Onset  . Miscarriages / [place] Mother   . Miscarriages / Stillbirths Sister   . Miscarriages / Stillbirths Maternal Grandmother   . Alcohol abuse Neg Hx   .  Arthritis Neg Hx   . Asthma Neg Hx   . COPD Neg Hx   . Cancer Neg Hx   . Birth defects Neg Hx   . Depression Neg Hx   . Diabetes Neg Hx   . Drug abuse Neg Hx   . Early death Neg Hx   . Hearing loss Neg Hx   . Heart disease Neg Hx   . Hyperlipidemia Neg Hx   . Hypertension Neg Hx   . Kidney disease Neg Hx   . Learning disabilities Neg Hx   . Mental illness Neg Hx   . Mental retardation Neg Hx   . Stroke Neg Hx   . Vision loss Neg Hx   . Varicose Veins Neg Hx   . Migraines Neg Hx     Social History Social History   Tobacco Use  . Smoking status: Former Smoker    Types: Cigars    Last attempt to quit: 08/04/2013    Years since quitting: 4.8  . Smokeless tobacco: Never Used  Substance Use Topics  . Alcohol use: No    Alcohol/week: 0.0 standard drinks  . Drug use: No     Allergies   Patient  has no known allergies.   Review of Systems Review of Systems  Constitutional: Positive for fatigue. Negative for chills and fever.  HENT: Negative for congestion, ear pain, rhinorrhea and sore throat.   Eyes: Negative for visual disturbance.  Respiratory: Negative for cough, chest tightness and shortness of breath.   Cardiovascular: Negative for chest pain.  Gastrointestinal: Positive for abdominal pain and nausea. Negative for blood in stool, constipation, diarrhea and vomiting.  Genitourinary: Negative for dysuria, flank pain, frequency, hematuria, vaginal bleeding and vaginal discharge.  Musculoskeletal: Negative for arthralgias and myalgias.  Skin: Negative for color change and rash.  Neurological: Positive for light-headedness. Negative for dizziness, seizures, syncope, weakness and headaches.     Physical Exam Updated Vital Signs BP 121/76 (BP Location: Right Arm)   Pulse (!) 117   Resp 14   Ht 5\' 1"  (1.549 m)   Wt 97.5 kg   LMP 05/09/2018   SpO2 98%   BMI 40.62 kg/m   Physical Exam  Constitutional: She is oriented to person, place, and time. She appears well-developed and well-nourished. No distress.  HENT:  Head: Normocephalic and atraumatic.  Mouth/Throat: Oropharynx is clear and moist.  Eyes: Right eye exhibits no discharge. Left eye exhibits no discharge.  Neck: Normal range of motion. Neck supple.  Cardiovascular: Normal rate, regular rhythm, normal heart sounds and intact distal pulses. Exam reveals no gallop and no friction rub.  No murmur heard. Pulmonary/Chest: Effort normal and breath sounds normal. No respiratory distress.  Respirations equal and unlabored, patient able to speak in full sentences, lungs clear to auscultation bilaterally  Abdominal: Soft. Bowel sounds are normal. She exhibits no distension and no mass. There is no tenderness. There is no guarding.  Abdomen soft, nondistended, nontender to palpation in all quadrants without guarding or  peritoneal signs  Musculoskeletal: She exhibits no deformity.  Neurological: She is alert and oriented to person, place, and time. Coordination normal.  Skin: Skin is warm and dry. She is not diaphoretic.  Psychiatric: She has a normal mood and affect. Her behavior is normal.  Nursing note and vitals reviewed.    ED Treatments / Results  Labs (all labs ordered are listed, but only abnormal results are displayed) Labs Reviewed  COMPREHENSIVE METABOLIC PANEL - Abnormal; Notable for the  following components:      Result Value   Total Bilirubin 0.2 (*)    All other components within normal limits  URINALYSIS, ROUTINE W REFLEX MICROSCOPIC - Abnormal; Notable for the following components:   Leukocytes, UA TRACE (*)    All other components within normal limits  LIPASE, BLOOD  CBC WITH DIFFERENTIAL/PLATELET  I-STAT BETA HCG BLOOD, ED ([facility], [facility], [facility] ONLY)    EKG None  Radiology No results found.  Procedures Procedures (including critical care time)  Medications Ordered in ED Medications  sodium chloride 0.9 % bolus 1,000 mL (0 mLs Intravenous Stopped 06/09/18 1536)  ondansetron (ZOFRAN) injection 4 mg (4 mg Intravenous Given 06/09/18 1411)     Initial Impression / Assessment and Plan / ED Course  I have reviewed the triage vital signs and the nursing notes.  Pertinent labs & imaging results that were available during my care of the patient were reviewed by me and considered in my medical decision making (see chart for details).  22 year old female presents for evaluation of 1 week of nausea and fatigue.  Her son was recently diagnosed with flu and is improving, she is not having any other infectious symptoms, no fevers or chills.  She reports some mild intermittent abdominal cramping, has not had any vomiting, diarrhea, denies urinary symptoms.  Patient does report her period is 2 days late and wonders if she could be pregnant.  No vaginal discharge.  On arrival patient is  tachycardic, but afebrile and all other vitals are normal, patient does appear anxious.  She has not had any chest pain or shortness of breath.  Has felt a bit lightheaded but has not had any syncopal episodes.  Will get basic labs, hCG, and urinalysis and will give IV fluids and Zofran.  Symptoms could be caused by viral syndrome, given that symptoms have been present for a week do not feel that flu test would change management.  No focal area of tenderness on abdominal exam.  Labs overall reassuring, no leukocytosis, normal hemoglobin, no signs of anemia that could be causing fatigue, patient has no acute electrolyte derangements, normal renal and liver function and normal lipase.  Her pregnancy is negative I discussed with the patient that it could be too early for pregnancy test to be positive yet and she will repeat this in 1 week if she does not have her menstrual cycle by then.  Urinalysis shows no evidence of infection.  After IV fluids and Zofran patient reports improvement in her symptoms, and is tolerating p.o.  Tachycardia has resolved with fluids, and I suspect a combination of mild dehydration and anxiety were the cause for this.  Patient has no risk factors for PE and is not complaining of any shortness of breath or chest pain.   At this time there does not appear to be any evidence of an acute emergency medical condition and the patient appears stable for discharge with appropriate outpatient follow up.Diagnosis was discussed with patient who verbalizes understanding and is agreeable to discharge. Pt case discussed with Dr. Dalene SeltzerSchlossman who agrees with my plan.   Vitals:   06/09/18 1510 06/09/18 1533  BP: 107/60 113/62  Pulse: 77 70  Resp: 16 16  Temp:  98.4 F (36.9 C)  SpO2: 100% 100%    Final Clinical Impressions(s) / ED Diagnoses   Final diagnoses:  Nausea  Fatigue, unspecified type    ED Discharge Orders         Ordered  ondansetron (ZOFRAN ODT) 4 MG disintegrating  tablet     06/09/18 1521           Jodi Geralds [place], [place] 06/09/18 1612    Alvira Monday, MD 06/17/18 1318

## 2018-09-15 ENCOUNTER — Other Ambulatory Visit: Payer: Self-pay

## 2018-09-15 ENCOUNTER — Encounter (HOSPITAL_COMMUNITY): Payer: Self-pay

## 2018-09-15 ENCOUNTER — Emergency Department (HOSPITAL_COMMUNITY)
Admission: EM | Admit: 2018-09-15 | Discharge: 2018-09-15 | Disposition: A | Discharge status: Home / Self Care | Payer: Managed Care, Other (non HMO) | Attending: Emergency Medicine | Admitting: Emergency Medicine

## 2018-09-15 DIAGNOSIS — N912 Amenorrhea, unspecified: Secondary | ICD-10-CM | POA: Insufficient documentation

## 2018-09-15 DIAGNOSIS — R350 Frequency of micturition: Secondary | ICD-10-CM | POA: Diagnosis not present

## 2018-09-15 DIAGNOSIS — Z87891 Personal history of nicotine dependence: Secondary | ICD-10-CM | POA: Diagnosis not present

## 2018-09-15 DIAGNOSIS — N926 Irregular menstruation, unspecified: Secondary | ICD-10-CM

## 2018-09-15 LAB — URINALYSIS, ROUTINE W REFLEX MICROSCOPIC
Bilirubin Urine: NEGATIVE
GLUCOSE, UA: NEGATIVE mg/dL
Hgb urine dipstick: NEGATIVE
KETONES UR: NEGATIVE mg/dL
LEUKOCYTE UA: NEGATIVE
Nitrite: NEGATIVE
PROTEIN: NEGATIVE mg/dL
Specific Gravity, Urine: 1.017 (ref 1.005–1.030)
pH: 8 (ref 5.0–8.0)

## 2018-09-15 LAB — POC URINE PREG, ED: PREG TEST UR: NEGATIVE

## 2018-09-15 NOTE — ED Notes (Signed)
Pt to room, pleasant on approach. Reports she thinks she is pregnant. Last period Jan 21st, not using contraception and sexually active. Reports normally cycle is regular. Reports urinary frequency without pain.  Also c/o NV at times.

## 2018-09-15 NOTE — ED Notes (Signed)
EDP at bedside  

## 2018-09-15 NOTE — ED Triage Notes (Signed)
Patient c/o urinary frequency x 2 weeks. Patient denies any dysuria.

## 2018-09-15 NOTE — Discharge Instructions (Addendum)
Follow-up with your doctors for the urinary frequency and the missed period.

## 2018-09-15 NOTE — ED Provider Notes (Signed)
[facility]   CSN: [id number] Arrival date & time: 09/15/18  1053    History   Chief Complaint Chief Complaint  Patient presents with  . Urinary Frequency    HPI Nicole Mclean is a 23 y.o. female.     HPI Patient presents with urinary frequency for last couple weeks.  States she feels as if the volume is normal but is going more frequent.  No pain.  Occasional nausea vomiting and some lightheadedness.  Also states she is late on her menses.  States she is around 1 week late.  States she had negative pregnancy test at home.  No vaginal bleeding.  No vaginal discharge.  No abdominal pain. Past Medical History:  Diagnosis Date  . Chlamydia 01/2016  . Gonorrhea April 2015  . Gonorrhea   . Infection    UTI  . STD (female)   . Syphilis   . UTI (urinary tract infection)     Patient Active Problem List   Diagnosis Date Noted  . Lower abdominal pain 08/15/2016  . Abnormal uterine bleeding (AUB) 12/08/2015  . Postpartum examination following vaginal delivery 08/12/2015  . 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 Other iron deficiency anemias 04/30/2015    Past Surgical History:  Procedure Laterality Date  . chin surgery    . HAND SURGERY     Left  hand  . RHYTIDECTOMY NECK / CHEEK / CHIN       OB History    Gravida  3   Para  2   Term  2   Preterm  0   AB  1   Living  2     SAB  1   TAB  0   Ectopic  0   Multiple  0   Live Births  2            Home Medications    Prior to Admission medications   Not on File    Family History Family History  Adopted: Yes  Problem Relation Age of Onset  . Miscarriages / [place] Mother   . Miscarriages / Stillbirths Sister   . Miscarriages / Stillbirths Maternal Grandmother   . Alcohol abuse Neg Hx   . Arthritis Neg Hx   . Asthma Neg Hx   . COPD Neg Hx   . Cancer Neg Hx   . Birth defects Neg Hx   . Depression Neg Hx   . Diabetes Neg Hx   . Drug abuse  Neg Hx   . Early death Neg Hx   . Hearing loss Neg Hx   . Heart disease Neg Hx   . Hyperlipidemia Neg Hx   . Hypertension Neg Hx   . Kidney disease Neg Hx   . Learning disabilities Neg Hx   . Mental illness Neg Hx   . Mental retardation Neg Hx   . Stroke Neg Hx   . Vision loss Neg Hx   . Varicose Veins Neg Hx   . Migraines Neg Hx     Social History Social History   Tobacco Use  . Smoking status: Former Smoker    Types: Cigars    Last attempt to quit: 08/04/2013    Years since quitting: 5.1  . Smokeless tobacco: Never Used  Substance Use Topics  . Alcohol use: No    Alcohol/week: 0.0 standard drinks  . Drug use: No     Allergies   Patient has no known allergies.  Review of Systems Review of Systems  Constitutional: Negative for appetite change.  HENT: Negative for congestion.   Respiratory: Negative for shortness of breath.   Cardiovascular: Negative for chest pain.  Gastrointestinal: Negative for abdominal pain.  Genitourinary: Positive for frequency. Negative for vaginal bleeding.  Musculoskeletal: Negative for back pain.  Neurological: Negative for weakness.  Psychiatric/Behavioral: Negative for confusion.     Physical Exam Updated Vital Signs BP 107/67   Pulse 77   Temp 98.8 F (37.1 C) (Oral)   Resp 18   Ht 5\' 1"  (1.549 m)   Wt 102.1 kg   LMP 08/07/2018   SpO2 100%   BMI 42.51 kg/m   Physical Exam HENT:     Head: Atraumatic.  Eyes:     Extraocular Movements: Extraocular movements intact.  Neck:     Musculoskeletal: Neck supple.  Cardiovascular:     Rate and Rhythm: Normal rate and regular rhythm.  Pulmonary:     Breath sounds: Normal breath sounds.  Abdominal:     Tenderness: There is no abdominal tenderness.  Musculoskeletal:     Right lower leg: No edema.     Left lower leg: No edema.  Skin:    General: Skin is warm.     Capillary Refill: Capillary refill takes less than 2 seconds.  Neurological:     General: No focal deficit  present.     Mental Status: She is alert.      ED Treatments / Results  Labs (all labs ordered are listed, but only abnormal results are displayed) Labs Reviewed  URINALYSIS, ROUTINE W REFLEX MICROSCOPIC  POC URINE PREG, ED    EKG None  Radiology No results found.  Procedures Procedures (including critical care time)  Medications Ordered in ED Medications - No data to display   Initial Impression / Assessment and Plan / ED Course  I have reviewed the triage vital signs and the nursing notes.  Pertinent labs & imaging results that were available during my care of the patient were reviewed by me and considered in my medical decision making (see chart for details).       Patient with urinary frequency and missed menses.  Urine negative and not pregnant.  Well-appearing otherwise.  Will discharge home and follow-up as an outpatient.  Discussed with patient.   Final Clinical Impressions(s) / ED Diagnoses   Final diagnoses:  Urinary frequency  Missed menses    ED Discharge Orders    None       Benjiman Core, MD 09/15/18 1616

## 2018-09-25 ENCOUNTER — Encounter (HOSPITAL_COMMUNITY): Payer: Self-pay | Admitting: Emergency Medicine

## 2018-09-25 ENCOUNTER — Emergency Department (HOSPITAL_COMMUNITY)
Admission: EM | Admit: 2018-09-25 | Discharge: 2018-09-25 | Disposition: A | Discharge status: Home / Self Care | Payer: Medicaid Other | Attending: Emergency Medicine | Admitting: Emergency Medicine

## 2018-09-25 ENCOUNTER — Other Ambulatory Visit: Payer: Self-pay

## 2018-09-25 ENCOUNTER — Emergency Department (HOSPITAL_COMMUNITY): Payer: Medicaid Other

## 2018-09-25 DIAGNOSIS — J101 Influenza due to other identified influenza virus with other respiratory manifestations: Secondary | ICD-10-CM | POA: Diagnosis not present

## 2018-09-25 DIAGNOSIS — J029 Acute pharyngitis, unspecified: Secondary | ICD-10-CM | POA: Diagnosis not present

## 2018-09-25 DIAGNOSIS — R05 Cough: Secondary | ICD-10-CM | POA: Diagnosis present

## 2018-09-25 DIAGNOSIS — Z87891 Personal history of nicotine dependence: Secondary | ICD-10-CM | POA: Insufficient documentation

## 2018-09-25 LAB — INFLUENZA PANEL BY PCR (TYPE A & B)
Influenza A By PCR: POSITIVE — AB
Influenza B By PCR: NEGATIVE

## 2018-09-25 LAB — GROUP A STREP BY PCR: Group A Strep by PCR: NOT DETECTED

## 2018-09-25 MED ORDER — ALBUTEROL SULFATE HFA 108 (90 BASE) MCG/ACT IN AERS
1.0000 | INHALATION_SPRAY | Freq: Once | RESPIRATORY_TRACT | Status: AC
  Administered 2018-09-25: 1 via RESPIRATORY_TRACT
  Filled 2018-09-25: qty 6.7

## 2018-09-25 MED ORDER — LIDOCAINE VISCOUS HCL 2 % MT SOLN: 15.0000 mL | OROMUCOSAL | 0 refills | Status: DC | PRN

## 2018-09-25 MED ORDER — CETIRIZINE HCL 5 MG PO TABS: 5.0000 mg | ORAL_TABLET | Freq: Every day | ORAL | 0 refills | Status: DC

## 2018-09-25 MED ORDER — ACETAMINOPHEN 325 MG PO TABS: 650.0000 mg | ORAL_TABLET | Freq: Four times a day (QID) | ORAL | 0 refills | Status: DC | PRN

## 2018-09-25 MED ORDER — BENZONATATE 100 MG PO CAPS: 200.0000 mg | ORAL_CAPSULE | Freq: Three times a day (TID) | ORAL | 0 refills | Status: DC

## 2018-09-25 MED ORDER — FLUTICASONE PROPIONATE 50 MCG/ACT NA SUSP: 1.0000 | Freq: Every day | NASAL | 2 refills | Status: DC

## 2018-09-25 MED ORDER — BENZONATATE 100 MG PO CAPS
200.0000 mg | ORAL_CAPSULE | Freq: Once | ORAL | Status: AC
  Administered 2018-09-25: 200 mg via ORAL
  Filled 2018-09-25: qty 2

## 2018-09-25 MED ORDER — ACETAMINOPHEN 325 MG PO TABS
650.0000 mg | ORAL_TABLET | Freq: Once | ORAL | Status: AC | PRN
  Administered 2018-09-25: 650 mg via ORAL
  Filled 2018-09-25: qty 2

## 2018-09-25 MED ORDER — SODIUM CHLORIDE 0.9 % IV BOLUS
500.0000 mL | Freq: Once | INTRAVENOUS | Status: AC
  Administered 2018-09-25: 500 mL via INTRAVENOUS

## 2018-09-25 MED ORDER — KETOROLAC TROMETHAMINE 15 MG/ML IJ SOLN
30.0000 mg | Freq: Once | INTRAMUSCULAR | Status: AC
  Administered 2018-09-25: 30 mg via INTRAVENOUS
  Filled 2018-09-25: qty 2

## 2018-09-25 NOTE — ED Provider Notes (Addendum)
[facility] Provider Note   CSN: [id number] Arrival date & time: 09/25/18  1501    History   Chief Complaint Chief Complaint  Patient presents with  . Cough  . Fever  . Chills  . Headache    HPI Nicole Mclean is a 23 y.o. female who presents to ED with a chief complaint of influenza-like illness and fever since 09/22/2018.  She reports cough with mucus, some wheezing.  She also reports pressure in her sore throat, sinuses, chills and fever of T-max 104.  She has been taking Tylenol with improvement in her fever but continues to have the other symptoms.  Sick contacts including her children with similar symptoms.  She did not receive her influenza vaccine this year.  Denies any recent travel, shortness of breath, vomiting, abdominal pain.     HPI  Past Medical History:  Diagnosis Date  . Chlamydia 01/2016  . Gonorrhea April 2015  . Gonorrhea   . Infection    UTI  . STD (female)   . Syphilis   . UTI (urinary tract infection)     Patient Active Problem List   Diagnosis Date Noted  . Lower abdominal pain 08/15/2016  . Abnormal uterine bleeding (AUB) 12/08/2015  . Postpartum examination following vaginal delivery 08/12/2015  . 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 Other iron deficiency anemias 04/30/2015    Past Surgical History:  Procedure Laterality Date  . chin surgery    . HAND SURGERY     Left  hand  . RHYTIDECTOMY NECK / CHEEK / CHIN       OB History    Gravida  3   Para  2   Term  2   Preterm  0   AB  1   Living  2     SAB  1   TAB  0   Ectopic  0   Multiple  0   Live Births  2            Home Medications    Prior to Admission medications   Medication Sig Start Date End Date Taking? Authorizing Provider  Phenylephrine-DM-GG-APAP (DAYTIME SEVERE COLD & FLU PO) Take 2 tablets by mouth daily as needed (flu, fever).   Yes [provider]  acetaminophen (TYLENOL) 325 MG tablet Take 2 tablets (650  mg total) by mouth every 6 (six) hours as needed. 09/25/18   Treyton Slimp, PA-C  benzonatate (TESSALON) 100 MG capsule Take 2 capsules (200 mg total) by mouth every 8 (eight) hours. 09/25/18   Caraline Deutschman, PA-C  cetirizine (ZYRTEC) 5 MG tablet Take 1 tablet (5 mg total) by mouth daily. 09/25/18   Iline Buchinger, PA-C  fluticasone (FLONASE) 50 MCG/ACT nasal spray Place 1 spray into both nostrils daily. 09/25/18   Arizbeth Cawthorn, PA-C  lidocaine (XYLOCAINE) 2 % solution Use as directed 15 mLs in the mouth or throat as needed for mouth pain. 09/25/18   Dietrich Pates, PA-C    Family History Family History  Adopted: Yes  Problem Relation Age of Onset  . Miscarriages / [place] Mother   . Miscarriages / Stillbirths Sister   . Miscarriages / Stillbirths Maternal Grandmother   . Alcohol abuse Neg Hx   . Arthritis Neg Hx   . Asthma Neg Hx   . COPD Neg Hx   . Cancer Neg Hx   . Birth defects Neg Hx   . Depression Neg Hx   . Diabetes Neg Hx   .  Drug abuse Neg Hx   . Early death Neg Hx   . Hearing loss Neg Hx   . Heart disease Neg Hx   . Hyperlipidemia Neg Hx   . Hypertension Neg Hx   . Kidney disease Neg Hx   . Learning disabilities Neg Hx   . Mental illness Neg Hx   . Mental retardation Neg Hx   . Stroke Neg Hx   . Vision loss Neg Hx   . Varicose Veins Neg Hx   . Migraines Neg Hx     Social History Social History   Tobacco Use  . Smoking status: Former Smoker    Types: Cigars    Last attempt to quit: 08/04/2013    Years since quitting: 5.1  . Smokeless tobacco: Never Used  Substance Use Topics  . Alcohol use: No    Alcohol/week: 0.0 standard drinks  . Drug use: No     Allergies   Patient has no known allergies.   Review of Systems Review of Systems  Constitutional: Positive for chills and fever. Negative for appetite change.  HENT: Positive for congestion, sinus pressure, sinus pain and sore throat. Negative for ear pain, rhinorrhea and sneezing.   Eyes: Negative for  photophobia and visual disturbance.  Respiratory: Positive for cough and wheezing. Negative for chest tightness and shortness of breath.   Cardiovascular: Negative for chest pain and palpitations.  Gastrointestinal: Positive for nausea. Negative for abdominal pain, blood in stool, constipation, diarrhea and vomiting.  Genitourinary: Negative for dysuria, hematuria and urgency.  Musculoskeletal: Positive for myalgias.  Skin: Negative for rash.  Neurological: Negative for dizziness, weakness and light-headedness.     Physical Exam Updated Vital Signs BP 114/86 (BP Location: Left Arm)   Pulse 94   Temp 99.9 F (37.7 C) (Oral)   Resp 17   Ht 5\' 1"  (1.549 m)   Wt 102.1 kg   LMP 09/15/2018 (Exact Date)   SpO2 97%   BMI 42.51 kg/m   Physical Exam Vitals signs and nursing note reviewed.  Constitutional:      General: She is not in acute distress.    Appearance: She is well-developed.  HENT:     Head: Normocephalic and atraumatic.     Right Ear: A middle ear effusion is present. Tympanic membrane is bulging. Tympanic membrane is not erythematous.     Left Ear: Tympanic membrane normal.     Nose: Rhinorrhea present.     Mouth/Throat:     Pharynx: Oropharynx is clear. Uvula midline.     Tonsils: Swelling: 1+ on the right. 1+ on the left.     Comments: Patient does not appear to be in acute distress. No trismus or drooling present. No pooling of secretions. Patient is tolerating secretions and is not in respiratory distress. No neck pain or tenderness to palpation of the neck. Full active and passive range of motion of the neck. No evidence of RPA or PTA. Eyes:     General: No scleral icterus.       Left eye: No discharge.     Conjunctiva/sclera: Conjunctivae normal.  Neck:     Musculoskeletal: Normal range of motion and neck supple.     Comments: No meningismus. Cardiovascular:     Rate and Rhythm: Normal rate and regular rhythm.     Heart sounds: Normal heart sounds. No murmur.  No friction rub. No gallop.   Pulmonary:     Effort: Pulmonary effort is normal. No respiratory distress.  Breath sounds: Normal breath sounds.  Abdominal:     General: Bowel sounds are normal. There is no distension.     Palpations: Abdomen is soft.     Tenderness: There is no abdominal tenderness. There is no guarding.  Musculoskeletal: Normal range of motion.  Skin:    General: Skin is warm and dry.     Findings: No rash.  Neurological:     Mental Status: She is alert.     Motor: No abnormal muscle tone.     Coordination: Coordination normal.      ED Treatments / Results  Labs (all labs ordered are listed, but only abnormal results are displayed) Labs Reviewed  INFLUENZA PANEL BY PCR (TYPE A & B) - Abnormal; Notable for the following components:      Result Value   Influenza A By PCR POSITIVE (*)    All other components within normal limits  GROUP A STREP BY PCR    EKG None  Radiology Dg Chest 2 View  Result Date: 09/25/2018 CLINICAL DATA:  Cough and chills; fever EXAM: CHEST - 2 VIEW COMPARISON:  July 19, 2017 FINDINGS: Lungs are clear. Heart size and pulmonary vascularity are normal. No adenopathy. No bone lesions. IMPRESSION: No edema or consolidation. Electronically Signed   By: Bretta Bang III M.D.   On: 09/25/2018 15:45    Procedures Procedures (including critical care time)  Medications Ordered in ED Medications  acetaminophen (TYLENOL) tablet 650 mg (650 mg Oral Given 09/25/18 1544)  albuterol (PROVENTIL HFA;VENTOLIN HFA) 108 (90 Base) MCG/ACT inhaler 1 puff (1 puff Inhalation Given 09/25/18 1616)  benzonatate (TESSALON) capsule 200 mg (200 mg Oral Given 09/25/18 1615)  sodium chloride 0.9 % bolus 500 mL (0 mLs Intravenous Stopped 09/25/18 1900)  ketorolac (TORADOL) 15 MG/ML injection 30 mg (30 mg Intravenous Given 09/25/18 1927)     Initial Impression / Assessment and Plan / ED Course  I have reviewed the triage vital signs and the nursing  notes.  Pertinent labs & imaging results that were available during my care of the patient were reviewed by me and considered in my medical decision making (see chart for details).        Patient with symptoms consistent with influenza.  Vitals are stable, low-grade fever.  No signs of dehydration, tolerating PO's.  Lungs are clear.  Chest x-ray is negative.  Flu swab was positive.  Strep swab was negative.  No evidence of RPA or PTA noted on my exam.  Discussed the cost versus benefit of Tamiflu treatment with the patient.  The patient understands that symptoms are greater than the recommended 24-48 hour window of treatment.  Patient will be discharged with instructions to orally hydrate, rest, and use over-the-counter medications such as anti-inflammatories ibuprofen and Aleve for muscle aches and Tylenol for fever.  Patient's initial tachycardia has improved with fluids; I feel that part of this could also be due to the albuterol use.  Improvement in body aches with Toradol.  She denies possibility of pregnancy.  Patient will also be given a cough suppressant, inhaler.  Patient is hemodynamically stable, in NAD, and able to ambulate in the ED. Evaluation does not show pathology that would require ongoing emergent intervention or inpatient treatment. I explained the diagnosis to the patient. Pain has been managed and has no complaints prior to discharge. Patient is comfortable with above plan and is stable for discharge at this time. All questions were answered prior to disposition. Strict return precautions for  returning to the ED were discussed. Encouraged follow up with PCP.    Portions of this note were generated with Scientist, clinical (histocompatibility and immunogenetics). Dictation errors may occur despite best attempts at proofreading.    Final Clinical Impressions(s) / ED Diagnoses   Final diagnoses:  Influenza A  Viral pharyngitis    ED Discharge Orders         Ordered    benzonatate (TESSALON) 100 MG capsule   Every 8 hours     09/25/18 1808    acetaminophen (TYLENOL) 325 MG tablet  Every 6 hours PRN     09/25/18 1808    fluticasone (FLONASE) 50 MCG/ACT nasal spray  Daily     09/25/18 1808    cetirizine (ZYRTEC) 5 MG tablet  Daily     09/25/18 1808    lidocaine (XYLOCAINE) 2 % solution  As needed     09/25/18 1808               Dietrich Pates, PA-C 09/25/18 1938    Little, Ambrose Finland, MD 09/26/18 [phone]

## 2018-09-25 NOTE — ED Triage Notes (Signed)
Pt reports cough, chills and fever and headache x 3 days. Last medicated at 4 am today withy tylenol for fever of 104.6. C/o intermittent nausea. Ate light breakfast this am. Pt reports productive cough. Wheezing cough noted at this time. Diminished breath sounds bilaterally.

## 2018-09-25 NOTE — Discharge Instructions (Addendum)
Take the following medications to help with your symptoms.  You can swish and spit the lidocaine to help with throat discomfort.  Do not swallow. Your flu swab was positive for influenza A.  Your strep test was negative. Return to the ED if you start to have worsening shortness of breath, chest pain, vomiting or coughing up blood, abdominal pain.

## 2018-11-12 ENCOUNTER — Emergency Department (HOSPITAL_COMMUNITY)
Admission: EM | Admit: 2018-11-12 | Discharge: 2018-11-13 | Disposition: A | Discharge status: Home / Self Care | Payer: Medicaid Other | Attending: Emergency Medicine | Admitting: Emergency Medicine

## 2018-11-12 ENCOUNTER — Encounter (HOSPITAL_COMMUNITY): Payer: Self-pay | Admitting: Emergency Medicine

## 2018-11-12 ENCOUNTER — Other Ambulatory Visit: Payer: Self-pay

## 2018-11-12 ENCOUNTER — Emergency Department (HOSPITAL_COMMUNITY): Payer: Medicaid Other

## 2018-11-12 DIAGNOSIS — Z5321 Procedure and treatment not carried out due to patient leaving prior to being seen by health care provider: Secondary | ICD-10-CM | POA: Diagnosis not present

## 2018-11-12 DIAGNOSIS — F410 Panic disorder [episodic paroxysmal anxiety] without agoraphobia: Secondary | ICD-10-CM | POA: Insufficient documentation

## 2018-11-12 LAB — I-STAT BETA HCG BLOOD, ED (MC, WL, AP ONLY): I-stat hCG, quantitative: <5 m[IU]/mL (ref <5)

## 2018-11-12 LAB — BASIC METABOLIC PANEL
Anion gap: 11 (ref 5–15)
BUN: 11 mg/dL (ref 6–20)
CO2: 26 mmol/L (ref 22–32)
Calcium: 9.6 mg/dL (ref 8.9–10.3)
Chloride: 102 mmol/L (ref 98–111)
Creatinine, Ser: 0.61 mg/dL (ref 0.44–1.00)
GFR calc Af Amer: >60 mL/min (ref >60)
GFR calc non Af Amer: >60 mL/min (ref >60)
Glucose, Bld: 105 mg/dL — ABNORMAL HIGH (ref 70–99)
Potassium: 3.4 mmol/L — ABNORMAL LOW (ref 3.5–5.1)
Sodium: 139 mmol/L (ref 135–145)

## 2018-11-12 LAB — CBC
HCT: 37.9 % (ref 36.0–46.0)
Hemoglobin: 12.4 g/dL (ref 12.0–15.0)
MCH: 29 pg (ref 26.0–34.0)
MCHC: 32.7 g/dL (ref 30.0–36.0)
MCV: 88.6 fL (ref 80.0–100.0)
Platelets: 325 10*3/uL (ref 150–400)
RBC: 4.28 MIL/uL (ref 3.87–5.11)
RDW: 12.7 % (ref 11.5–15.5)
WBC: 10.9 10*3/uL — ABNORMAL HIGH (ref 4.0–10.5)
nRBC: 0 % (ref 0.0–0.2)

## 2018-11-12 LAB — TROPONIN I: Troponin I: <0.03 ng/mL (ref <0.03)

## 2018-11-12 MED ORDER — SODIUM CHLORIDE 0.9% FLUSH: 3.0000 mL | Freq: Once | INTRAVENOUS | Status: DC

## 2018-11-12 NOTE — ED Triage Notes (Addendum)
Pt present to ED c/o panic attacks. Pt reports she has had 7-8 today. R chest pain following right after the panic attack. Pain 6/10. Pain is not currently happening. Last occurred at 2115. Pt no recent added stress. No heart hx or hx of anxiety. Does not take any medications regularly. Pt was adopted so does not have any family hx. Pt reports that she works at [employer] and multiple people she works with have been sick.

## 2018-11-12 NOTE — ED Notes (Signed)
Pt states she has to get up for work and is leaving AMA

## 2019-02-19 ENCOUNTER — Ambulatory Visit: Payer: Medicaid Other

## 2019-03-25 ENCOUNTER — Emergency Department (HOSPITAL_COMMUNITY)
Admission: EM | Admit: 2019-03-25 | Discharge: 2019-03-25 | Disposition: A | Discharge status: Home / Self Care | Payer: 59 | Attending: Emergency Medicine | Admitting: Emergency Medicine

## 2019-03-25 ENCOUNTER — Encounter (HOSPITAL_COMMUNITY): Payer: Self-pay

## 2019-03-25 ENCOUNTER — Other Ambulatory Visit: Payer: Self-pay

## 2019-03-25 DIAGNOSIS — Z87891 Personal history of nicotine dependence: Secondary | ICD-10-CM | POA: Diagnosis not present

## 2019-03-25 DIAGNOSIS — N898 Other specified noninflammatory disorders of vagina: Secondary | ICD-10-CM | POA: Diagnosis present

## 2019-03-25 DIAGNOSIS — Z79899 Other long term (current) drug therapy: Secondary | ICD-10-CM | POA: Insufficient documentation

## 2019-03-25 DIAGNOSIS — Z113 Encounter for screening for infections with a predominantly sexual mode of transmission: Secondary | ICD-10-CM | POA: Insufficient documentation

## 2019-03-25 DIAGNOSIS — A599 Trichomoniasis, unspecified: Secondary | ICD-10-CM | POA: Diagnosis not present

## 2019-03-25 LAB — URINALYSIS, ROUTINE W REFLEX MICROSCOPIC
Bilirubin Urine: NEGATIVE
Glucose, UA: NEGATIVE mg/dL
Ketones, ur: NEGATIVE mg/dL
Nitrite: NEGATIVE
Protein, ur: NEGATIVE mg/dL
Specific Gravity, Urine: 1.024 (ref 1.005–1.030)
pH: 6 (ref 5.0–8.0)

## 2019-03-25 LAB — WET PREP, GENITAL
Clue Cells Wet Prep HPF POC: NONE SEEN
Sperm: NONE SEEN
Yeast Wet Prep HPF POC: NONE SEEN

## 2019-03-25 LAB — I-STAT BETA HCG BLOOD, ED (MC, WL, AP ONLY): I-stat hCG, quantitative: <5 m[IU]/mL (ref <5)

## 2019-03-25 MED ORDER — METRONIDAZOLE 500 MG PO TABS
2000.0000 mg | ORAL_TABLET | Freq: Once | ORAL | Status: AC
  Administered 2019-03-25: 17:00:00 2000 mg via ORAL
  Filled 2019-03-25: qty 4

## 2019-03-25 NOTE — Discharge Instructions (Addendum)
YOU HAVE BEEN DIAGNOSED WITH TRICHOMONIASIS.THIS IS A SEXAULLY TRANSMITTED ILLNESS. YOU WERE TREATED TODAY AND YOU SHOULD NOT HAVE UNPROTECTED INTERCOURSE OF ANY KIND (ORAL, ANAL, OR VAGINAL) FOR THE NEXT 10 DAYS. YOUR PARTNER NEEDS TO BE TREATED AND MUST WAIT 10 DAYS AFTER HE HAS BEEN TREATED TO HAVE INTERCOURSE.

## 2019-03-25 NOTE — ED Triage Notes (Signed)
Patient c/o vaginal discharge.   Patient states she think she has a yeast infection or UTI.   C/O pressure while urination, pain in right lower back, and discomfort during intercourse.    A/ox4 Ambulatory to restroom for UA

## 2019-03-25 NOTE — ED Provider Notes (Signed)
[facility] DEPT Provider Note   CSN: [id number] Arrival date & time: 03/25/19  1348     History   Chief Complaint Chief Complaint  Patient presents with  . Vaginal Discharge    HPI Nicole Mclean is a 23 y.o. female presents to the emergency department with chief complaint of vaginal discharge.  Patient states that she has had 2 weeks of worsening suprapubic discomfort, pain with intercourse, burning when she urinates and now has watery vaginal discharge.  She is sexually active with a single female partner.  She states that they try to use condoms however sometimes they "slip up."  She is not on any contraceptive medication and has not had a period since the end of March.  She denies fevers or chills     HPI  Past Medical History:  Diagnosis Date  . Chlamydia 01/2016  . Gonorrhea April 2015  . Gonorrhea   . Infection    UTI  . STD (female)   . Syphilis   . UTI (urinary tract infection)     Patient Active Problem List   Diagnosis Date Noted  . Lower abdominal pain 08/15/2016  . Abnormal uterine bleeding (AUB) 12/08/2015  . Postpartum examination following vaginal delivery 08/12/2015  . 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 Other iron deficiency anemias 04/30/2015    Past Surgical History:  Procedure Laterality Date  . chin surgery    . HAND SURGERY     Left  hand  . RHYTIDECTOMY NECK / CHEEK / CHIN       OB History    Gravida  3   Para  2   Term  2   Preterm  0   AB  1   Living  2     SAB  1   TAB  0   Ectopic  0   Multiple  0   Live Births  2            Home Medications    Prior to Admission medications   Medication Sig Start Date End Date Taking? Authorizing Provider  acetaminophen (TYLENOL) 325 MG tablet Take 2 tablets (650 mg total) by mouth every 6 (six) hours as needed. 09/25/18   Khatri, Hina, PA-C  benzonatate (TESSALON) 100 MG capsule Take 2 capsules (200 mg total) by mouth every 8 (eight) hours.  09/25/18   Khatri, Hina, PA-C  cetirizine (ZYRTEC) 5 MG tablet Take 1 tablet (5 mg total) by mouth daily. 09/25/18   Khatri, Hina, PA-C  fluticasone (FLONASE) 50 MCG/ACT nasal spray Place 1 spray into both nostrils daily. 09/25/18   Khatri, Hina, PA-C  lidocaine (XYLOCAINE) 2 % solution Use as directed 15 mLs in the mouth or throat as needed for mouth pain. 09/25/18   Khatri, Hina, PA-C  Phenylephrine-DM-GG-APAP (DAYTIME SEVERE COLD & FLU PO) Take 2 tablets by mouth daily as needed (flu, fever).    [provider]    Family History Family History  Adopted: Yes  Problem Relation Age of Onset  . Miscarriages / [place] Mother   . Miscarriages / Stillbirths Sister   . Miscarriages / Stillbirths Maternal Grandmother   . Alcohol abuse Neg Hx   . Arthritis Neg Hx   . Asthma Neg Hx   . COPD Neg Hx   . Cancer Neg Hx   . Birth defects Neg Hx   . Depression Neg Hx   . Diabetes Neg Hx   . Drug abuse Neg Hx   .  Early death Neg Hx   . Hearing loss Neg Hx   . Heart disease Neg Hx   . Hyperlipidemia Neg Hx   . Hypertension Neg Hx   . Kidney disease Neg Hx   . Learning disabilities Neg Hx   . Mental illness Neg Hx   . Mental retardation Neg Hx   . Stroke Neg Hx   . Vision loss Neg Hx   . Varicose Veins Neg Hx   . Migraines Neg Hx     Social History Social History   Tobacco Use  . Smoking status: Former Smoker    Types: Cigars    Quit date: 08/04/2013    Years since quitting: 5.6  . Smokeless tobacco: Never Used  Substance Use Topics  . Alcohol use: No    Alcohol/week: 0.0 standard drinks  . Drug use: No     Allergies   Patient has no known allergies.   Review of Systems Review of Systems  Ten systems reviewed and are negative for acute change, except as noted in the HPI.   Physical Exam Updated Vital Signs BP (!) 143/98 (BP Location: Left Arm)   Pulse 77   Temp 99 F (37.2 C) (Oral)   Resp 18   LMP 02/11/2019   SpO2 99%   Physical Exam Vitals signs and  nursing note reviewed. Exam conducted with a chaperone present.  Constitutional:      General: She is not in acute distress.    Appearance: She is well-developed. She is not diaphoretic.  HENT:     Head: Normocephalic and atraumatic.  Eyes:     General: No scleral icterus.    Conjunctiva/sclera: Conjunctivae normal.  Neck:     Musculoskeletal: Normal range of motion.  Cardiovascular:     Rate and Rhythm: Normal rate and regular rhythm.     Heart sounds: Normal heart sounds. No murmur. No friction rub. No gallop.   Pulmonary:     Effort: Pulmonary effort is normal. No respiratory distress.     Breath sounds: Normal breath sounds.  Abdominal:     General: Bowel sounds are normal. There is no distension.     Palpations: Abdomen is soft. There is no mass.     Tenderness: There is no abdominal tenderness. There is no guarding.  Genitourinary:    Comments: Pelvic exam: normal external genitalia, vulva, vagina, cervix, uterus and adnexa.  Skin:    General: Skin is warm and dry.  Neurological:     Mental Status: She is alert and oriented to person, place, and time.  Psychiatric:        Behavior: Behavior normal.      ED Treatments / Results  Labs (all labs ordered are listed, but only abnormal results are displayed) Labs Reviewed - No data to display  EKG None  Radiology No results found.  Procedures Procedures (including critical care time)  Medications Ordered in ED Medications - No data to display   Initial Impression / Assessment and Plan / ED Course  I have reviewed the triage vital signs and the nursing notes.  Pertinent labs & imaging results that were available during my care of the patient were reviewed by me and considered in my medical decision making (see chart for details).  Clinical Course as of Mar 24 1521  Mon Mar 25, 2019  1445 WBC, UA: 21-50 [AH]  1445 Leukocytes,Ua(!): LARGE [AH]    Clinical Course User Index [AH] Arthor Captain, PA-C  Patient here with vaginal discharge and pelvic discomfort.  I personally reviewed the patient's labs which show negative pregnancy test.  Urine I believe is contaminated.  Her wet prep is positive for trichomoniasis and white blood cells.  I explained the diagnosis to the patient, intercourse precautions.  Patient given a single dose of 2 g of oral Flagyl.  Instructed in partner treatment.  She appears appropriate for discharge at this time  Final Clinical Impressions(s) / ED Diagnoses   Final diagnoses:  Trichimoniasis    ED Discharge Orders    None       Arthor CaptainHarris, Buffy Ehler, PA-C 03/25/19 1927    Cathren LaineSteinl, Kevin, MD 03/26/19 [phone]

## 2019-03-26 LAB — HIV ANTIBODY (ROUTINE TESTING W REFLEX): HIV Screen 4th Generation wRfx: NONREACTIVE

## 2019-03-26 LAB — RPR: RPR Ser Ql: NONREACTIVE

## 2019-03-27 LAB — GC/CHLAMYDIA PROBE AMP (~~LOC~~) NOT AT ARMC
Chlamydia: POSITIVE — AB
Neisseria Gonorrhea: NEGATIVE

## 2019-03-29 ENCOUNTER — Other Ambulatory Visit: Payer: Self-pay

## 2019-03-29 MED ORDER — AZITHROMYCIN 250 MG PO TABS: 1000.0000 mg | ORAL_TABLET | Freq: Once | ORAL | 0 refills | Status: AC

## 2019-05-14 ENCOUNTER — Other Ambulatory Visit: Payer: Self-pay

## 2019-05-15 ENCOUNTER — Encounter: Payer: Self-pay | Admitting: Certified Nurse Midwife

## 2019-05-15 ENCOUNTER — Other Ambulatory Visit: Payer: Self-pay

## 2019-05-15 ENCOUNTER — Other Ambulatory Visit (HOSPITAL_COMMUNITY)
Admission: RE | Admit: 2019-05-15 | Discharge: 2019-05-15 | Disposition: A | Discharge status: Home / Self Care | Payer: 59 | Source: Ambulatory Visit | Attending: Certified Nurse Midwife | Admitting: Certified Nurse Midwife

## 2019-05-15 ENCOUNTER — Ambulatory Visit: Payer: 59 | Admitting: Certified Nurse Midwife

## 2019-05-15 VITALS — BP 102/64 | HR 68 | Temp 97.1°F | Resp 16 | Ht 60.75 in | Wt 233.0 lb

## 2019-05-15 DIAGNOSIS — Z124 Encounter for screening for malignant neoplasm of cervix: Secondary | ICD-10-CM | POA: Diagnosis present

## 2019-05-15 DIAGNOSIS — Z113 Encounter for screening for infections with a predominantly sexual mode of transmission: Secondary | ICD-10-CM

## 2019-05-15 DIAGNOSIS — Z30011 Encounter for initial prescription of contraceptive pills: Secondary | ICD-10-CM | POA: Diagnosis not present

## 2019-05-15 DIAGNOSIS — Z01419 Encounter for gynecological examination (general) (routine) without abnormal findings: Secondary | ICD-10-CM | POA: Diagnosis not present

## 2019-05-15 DIAGNOSIS — R635 Abnormal weight gain: Secondary | ICD-10-CM

## 2019-05-15 MED ORDER — NORETHIN ACE-ETH ESTRAD-FE 1-20 MG-MCG PO TABS: 1.0000 | ORAL_TABLET | Freq: Every day | ORAL | 12 refills | Status: DC

## 2019-05-15 NOTE — Progress Notes (Signed)
23 y.o. [id number] Single  biracial Fe here for annual exam. Periods normal no issues monthly, duration 5 days, day 2-3 heavy and then light. No missed periods. Has been struggling with weight gain and weight loss. She exercises daily and runs after toddler. She tries to eat well and good food choice. Not smoking now, only medications are for allergies. Has had STD Chlamydia, Gonorrhea X 2 and ? Syphillis and trichomonas in the past and treated with recheck.. Requests STD screening. Partner shared that he was positive for Chlamydia at some point and ? Treatment. Patient has noted some vaginal itching only, no increase in discharge or pelvic pain. Patient would like to start on contraception. She has used Nuvaring and OCP with no problems, and would like to start on OCP again. No other health issues today.  Patient's last menstrual period was 04/29/2019 (exact date).          Sexually active: Yes.    The current method of family planning is none.    Exercising: Yes.    walking, home workouts Smoker:  no  Review of Systems  Constitutional: Negative.   HENT: Negative.   Eyes: Negative.   Respiratory: Negative.   Cardiovascular: Negative.   Skin: Negative.        Vaginal itching, odor, discharge    Health Maintenance: Pap:  2018 neg per patient History of Abnormal Pap: no MMG:  none Self Breast exams: yes Colonoscopy:  none BMD:   none TDaP:  2016 Shingles: no Pneumonia: not done Hep C and HIV: HIV neg 03/2019 Labs: yes   reports that she quit smoking about 5 years ago. Her smoking use included cigars. She has never used smokeless tobacco. She reports that she does not drink alcohol or use drugs.  Past Medical History:  Diagnosis Date  . Chlamydia 01/2016  . Gonorrhea April 2015  . Gonorrhea   . Infection    UTI  . STD (female)   . Syphilis   . UTI (urinary tract infection)     Past Surgical History:  Procedure Laterality Date  . chin surgery    . HAND SURGERY     Left  hand   . RHYTIDECTOMY NECK / CHEEK / CHIN      Current Outpatient Medications  Medication Sig Dispense Refill  . acetaminophen (TYLENOL) 325 MG tablet Take 2 tablets (650 mg total) by mouth every 6 (six) hours as needed. 15 tablet 0  . cetirizine (ZYRTEC) 5 MG tablet Take 1 tablet (5 mg total) by mouth daily. 10 tablet 0  . fluticasone (FLONASE) 50 MCG/ACT nasal spray Place 1 spray into both nostrils daily. 16 g 2   No current facility-administered medications for this visit.     Family History  Adopted: Yes  Problem Relation Age of Onset  . Learning disabilities Neg Hx   . Mental illness Neg Hx   . Mental retardation Neg Hx   . Vision loss Neg Hx   . Varicose Veins Neg Hx     ROS:  Pertinent items are noted in HPI.  Otherwise, a comprehensive ROS was negative.  Exam:   BP 102/64   Pulse 68   Temp (!) 97.1 F (36.2 C) (Skin)   Resp 16   Ht 5' 0.75" (1.543 m)   Wt 233 lb (105.7 kg)   LMP 04/29/2019 (Exact Date)   BMI 44.39 kg/m  Height: 5' 0.75" (154.3 cm) Ht Readings from Last 3 Encounters:  05/15/19 5' 0.75" (1.543 m)  11/12/18 5\' 1"  (1.549 m)  09/25/18 5\' 1"  (1.549 m)    General appearance: alert, cooperative and appears stated age Head: Normocephalic, without obvious abnormality, atraumatic Neck: no adenopathy, supple, symmetrical, trachea midline and thyroid normal to inspection and palpation Lungs: clear to auscultation bilaterally Breasts: normal appearance, no masses or tenderness, No nipple retraction or dimpling, No nipple discharge or bleeding, No axillary or supraclavicular adenopathy Heart: regular rate and rhythm Abdomen: soft, non-tender; no masses,  no organomegaly Extremities: extremities normal, atraumatic, no cyanosis or edema Skin: Skin color, texture, turgor normal. No rashes or lesions Lymph nodes: Cervical, supraclavicular, and axillary nodes normal. No abnormal inguinal nodes palpated Neurologic: Grossly normal   Pelvic: External genitalia:  no  lesions              Urethra:  normal appearing urethra with no masses, tenderness or lesions              Bartholin's and Skene's: normal                 Vagina: normal appearing vagina with normal color and normal appearing  discharge, no lesions              Cervix: multiparous appearance, no cervical motion tenderness and no lesions              Pap taken: Yes.   Bimanual Exam:  Uterus:  normal size, contour, position, consistency, mobility, non-tender and anteverted              Adnexa: normal adnexa and no mass, fullness, tenderness               Rectovaginal: Confirms               Anus:  normal appearance no lesions  Chaperone present: yes  A:  Well Woman with normal exam  Contraception none desires again, prefers OCP  History of Chlamydia, Gonorrhea X 2, Syphilis   Overweight working on diet and exercise, struggling with any change in weight  P:   Reviewed health and wellness pertinent to exam  Discussed risks/benefits/warning signs with OCP use and importance of no missed pills for best protection. Desires RX.  Rx Loestrin 1/20 Fe start on first day of period and continue from one pack to the next. See order, printed instructions given  Discussed STD prevention with condom use of no sexual activity.  Labs: GC/chlamydia, trichomonas, HIV, RPR, Hep C, affirm  Discussed exercise, good food choices and determination to lose weight. Thyroid can affect weight also.  Lab: TSH  Pap smear: yes   counseled on breast self exam, STD prevention, HIV risk factors and prevention, feminine hygiene, use and side effects of OCP's, adequate intake of calcium and vitamin D, diet and exercise  return annually or prn  An After Visit Summary was printed and given to the patient.

## 2019-05-15 NOTE — Patient Instructions (Signed)
General topics  Next pap or exam is  due in 1 year Take a Women's multivitamin Take 1200 mg. of calcium daily - prefer dietary If any concerns in interim to call back  Breast Self-Awareness Practicing breast self-awareness may pick up problems early, prevent significant medical complications, and possibly save your life. By practicing breast self-awareness, you can become familiar with how your breasts look and feel and if your breasts are changing. This allows you to notice changes early. It can also offer you some reassurance that your breast health is good. One way to learn what is normal for your breasts and whether your breasts are changing is to do a breast self-exam. If you find a lump or something that was not present in the past, it is best to contact your caregiver right away. Other findings that should be evaluated by your caregiver include nipple discharge, especially if it is bloody; skin changes or reddening; areas where the skin seems to be pulled in (retracted); or new lumps and bumps. Breast pain is seldom associated with cancer (malignancy), but should also be evaluated by a caregiver. BREAST SELF-EXAM The best time to examine your breasts is 5 7 days after your menstrual period is over.  [employer] Patient Information 2013 [place].   Exercise to Stay Healthy Exercise helps you become and stay healthy. EXERCISE IDEAS AND TIPS Choose exercises that:  You enjoy.  Fit into your day. You do not need to exercise really hard to be healthy. You can do exercises at a slow or medium level and stay healthy. You can:  Stretch before and after working out.  Try yoga, Pilates, or tai chi.  Lift weights.  Walk fast, swim, jog, run, climb stairs, bicycle, dance, or rollerskate.  Take aerobic classes. Exercises that burn about 150 calories:  Running 1  miles in 15 minutes.  Playing volleyball for 45 to 60 minutes.  Washing and waxing a car for 45 to 60 minutes.   Playing touch football for 45 minutes.  Walking 1  miles in 35 minutes.  Pushing a stroller 1  miles in 30 minutes.  Playing basketball for 30 minutes.  Raking leaves for 30 minutes.  Bicycling 5 miles in 30 minutes.  Walking 2 miles in 30 minutes.  Dancing for 30 minutes.  Shoveling snow for 15 minutes.  Swimming laps for 20 minutes.  Walking up stairs for 15 minutes.  Bicycling 4 miles in 15 minutes.  Gardening for 30 to 45 minutes.  Jumping rope for 15 minutes.  Washing windows or floors for 45 to 60 minutes. Document Released: 08/06/2010 Document Revised: 09/26/2011 Document Reviewed: 08/06/2010 [facility] Patient Information 2013 [place].   Other topics ( that may be useful information):    Sexually Transmitted Disease Sexually transmitted disease (STD) refers to any infection that is passed from person to person during sexual activity. This may happen by way of saliva, semen, blood, vaginal mucus, or urine. Common STDs include:  Gonorrhea.  Chlamydia.  Syphilis.  HIV/AIDS.  Genital herpes.  Hepatitis B and C.  Trichomonas.  Human papillomavirus (HPV).  Pubic lice. CAUSES  An STD may be spread by bacteria, virus, or parasite. A person can get an STD by:  Sexual intercourse with an infected person.  Sharing sex toys with an infected person.  Sharing needles with an infected person.  Having intimate contact with the genitals, mouth, or rectal areas of an infected person. SYMPTOMS  Some people may not have any symptoms, but  they can still pass the infection to others. Different STDs have different symptoms. Symptoms include:  Painful or bloody urination.  Pain in the pelvis, abdomen, vagina, anus, throat, or eyes.  Skin rash, itching, irritation, growths, or sores (lesions). These usually occur in the genital or anal area.  Abnormal vaginal discharge.  Penile discharge in men.  Soft, flesh-colored skin growths in the genital  or anal area.  Fever.  Pain or bleeding during sexual intercourse.  Swollen glands in the groin area.  Yellow skin and eyes (jaundice). This is seen with hepatitis. DIAGNOSIS  To make a diagnosis, your caregiver may:  Take a medical history.  Perform a physical exam.  Take a specimen (culture) to be examined.  Examine a sample of discharge under a microscope.  Perform blood test TREATMENT   Chlamydia, gonorrhea, trichomonas, and syphilis can be cured with antibiotic medicine.  Genital herpes, hepatitis, and HIV can be treated, but not cured, with prescribed medicines. The medicines will lessen the symptoms.  Genital warts from HPV can be treated with medicine or by freezing, burning (electrocautery), or surgery. Warts may come back.  HPV is a virus and cannot be cured with medicine or surgery.However, abnormal areas may be followed very closely by your caregiver and may be removed from the cervix, vagina, or vulva through office procedures or surgery. If your diagnosis is confirmed, your recent sexual partners need treatment. This is true even if they are symptom-free or have a negative culture or evaluation. They should not have sex until their caregiver says it is okay. HOME CARE INSTRUCTIONS  All sexual partners should be informed, tested, and treated for all STDs.  Take your antibiotics as directed. Finish them even if you start to feel better.  Only take over-the-counter or prescription medicines for pain, discomfort, or fever as directed by your caregiver.  Rest.  Eat a balanced diet and drink enough fluids to keep your urine clear or pale yellow.  Do not have sex until treatment is completed and you have followed up with your caregiver. STDs should be checked after treatment.  Keep all follow-up appointments, Pap tests, and blood tests as directed by your caregiver.  Only use latex condoms and water-soluble lubricants during sexual activity. Do not use petroleum  jelly or oils.  Avoid alcohol and illegal drugs.  Get vaccinated for HPV and hepatitis. If you have not received these vaccines in the past, talk to your caregiver about whether one or both might be right for you.  Avoid risky sex practices that can break the skin. The only way to avoid getting an STD is to avoid all sexual activity.Latex condoms and dental dams (for oral sex) will help lessen the risk of getting an STD, but will not completely eliminate the risk. SEEK MEDICAL CARE IF:   You have a fever.  You have any new or worsening symptoms. Document Released: 09/24/2002 Document Revised: 09/26/2011 Document Reviewed: 10/01/2010 [facility] Patient Information 2013 [place].    Domestic Abuse You are being battered or abused if someone close to you hits, pushes, or physically hurts you in any way. You also are being abused if you are forced into activities. You are being sexually abused if you are forced to have sexual contact of any kind. You are being emotionally abused if you are made to feel worthless or if you are constantly threatened. It is important to remember that help is available. No one has the right to abuse you. PREVENTION OF FURTHER  ABUSE  Learn the warning signs of danger. This varies with situations but may include: the use of alcohol, threats, isolation from friends and family, or forced sexual contact. Leave if you feel that violence is going to occur.  If you are attacked or beaten, report it to the police so the abuse is documented. You do not have to press charges. The police can protect you while you or the attackers are leaving. Get the officer's name and badge number and a copy of the report.  Find someone you can trust and tell them what is happening to you: your caregiver, a nurse, clergy member, close friend or family member. Feeling ashamed is natural, but remember that you have done nothing wrong. No one deserves abuse. Document Released: 07/01/2000  Document Revised: 09/26/2011 Document Reviewed: 09/09/2010 [facility] Patient Information 2013 [place].    How Much is Too Much Alcohol? Drinking too much alcohol can cause injury, accidents, and health problems. These types of problems can include:   Car crashes.  Falls.  Family fighting (domestic violence).  Drowning.  Fights.  Injuries.  Burns.  Damage to certain organs.  Having a baby with birth defects. ONE DRINK CAN BE TOO MUCH WHEN YOU ARE:  Working.  Pregnant or breastfeeding.  Taking medicines. Ask your doctor.  Driving or planning to drive. If you or someone you know has a drinking problem, get help from a doctor.  Document Released: 04/30/2009 Document Revised: 09/26/2011 Document Reviewed: 04/30/2009 [facility] Patient Information 2013 [place].   Smoking Hazards Smoking cigarettes is extremely bad for your health. Tobacco smoke has over 200 known poisons in it. There are over 60 chemicals in tobacco smoke that cause cancer. Some of the chemicals found in cigarette smoke include:   Cyanide.  Benzene.  Formaldehyde.  Methanol (wood alcohol).  Acetylene (fuel used in welding torches).  Ammonia. Cigarette smoke also contains the poisonous gases nitrogen oxide and carbon monoxide.  Cigarette smokers have an increased risk of many serious medical problems and Smoking causes approximately:  90% of all lung cancer deaths in men.  80% of all lung cancer deaths in women.  90% of deaths from chronic obstructive lung disease. Compared with nonsmokers, smoking increases the risk of:  Coronary heart disease by 2 to 4 times.  Stroke by 2 to 4 times.  Men developing lung cancer by 23 times.  Women developing lung cancer by 13 times.  Dying from chronic obstructive lung diseases by 12 times.  . Smoking is the most preventable cause of death and disease in our society.  WHY IS SMOKING ADDICTIVE?  Nicotine is the chemical agent in  tobacco that is capable of causing addiction or dependence.  When you smoke and inhale, nicotine is absorbed rapidly into the bloodstream through your lungs. Nicotine absorbed through the lungs is capable of creating a powerful addiction. Both inhaled and non-inhaled nicotine may be addictive.  Addiction studies of cigarettes and spit tobacco show that addiction to nicotine occurs mainly during the teen years, when young people begin using tobacco products. WHAT ARE THE BENEFITS OF QUITTING?  There are many health benefits to quitting smoking.   Likelihood of developing cancer and heart disease decreases. Health improvements are seen almost immediately.  Blood pressure, pulse rate, and breathing patterns start returning to normal soon after quitting. QUITTING SMOKING   American Lung Association - 1-800-LUNGUSA  American Cancer Society - 1-800-ACS-2345 Document Released: 08/11/2004 Document Revised: 09/26/2011 Document Reviewed: 04/15/2009 [facility] Patient Information 2013 [place]  [place].   Stress Management Stress is a state of physical or mental tension that often results from changes in your life or normal routine. Some common causes of stress are:  Death of a loved one.  Injuries or severe illnesses.  Getting fired or changing jobs.  Moving into a new home. Other causes may be:  Sexual problems.  Business or financial losses.  Taking on a large debt.  Regular conflict with someone at home or at work.  Constant tiredness from lack of sleep. It is not just bad things that are stressful. It may be stressful to:  Win the lottery.  Get married.  Buy a new car. The amount of stress that can be easily tolerated varies from person to person. Changes generally cause stress, regardless of the types of change. Too much stress can affect your health. It may lead to physical or emotional problems. Too little stress (boredom) may also become stressful. SUGGESTIONS TO REDUCE  STRESS:  Talk things over with your family and friends. It often is helpful to share your concerns and worries. If you feel your problem is serious, you may want to get help from a professional counselor.  Consider your problems one at a time instead of lumping them all together. Trying to take care of everything at once may seem impossible. List all the things you need to do and then start with the most important one. Set a goal to accomplish 2 or 3 things each day. If you expect to do too many in a single day you will naturally fail, causing you to feel even more stressed.  Do not use alcohol or drugs to relieve stress. Although you may feel better for a short time, they do not remove the problems that caused the stress. They can also be habit forming.  Exercise regularly - at least 3 times per week. Physical exercise can help to relieve that "uptight" feeling and will relax you.  The shortest distance between despair and [place] is often a good night's sleep.  Go to bed and get up on time allowing yourself time for appointments without being rushed.  Take a short "time-out" period from any stressful situation that occurs during the day. Close your eyes and take some deep breaths. Starting with the muscles in your face, tense them, hold it for a few seconds, then relax. Repeat this with the muscles in your neck, shoulders, hand, stomach, back and legs.  Take good care of yourself. Eat a balanced diet and get plenty of rest.  Schedule time for having fun. Take a break from your daily routine to relax. HOME CARE INSTRUCTIONS   Call if you feel overwhelmed by your problems and feel you can no longer manage them on your own.  Return immediately if you feel like hurting yourself or someone else. Document Released: 12/28/2000 Document Revised: 09/26/2011 Document Reviewed: 08/20/2007 [facility] Patient Information 2013 [place].   Oral Contraception Use Oral contraceptive pills (OCPs) are  medicines that you take to prevent pregnancy. OCPs work by:  Preventing the ovaries from releasing eggs.  Thickening mucus in the lower part of the uterus (cervix), which prevents sperm from entering the uterus.  Thinning the lining of the uterus (endometrium), which prevents a fertilized egg from attaching to the endometrium. OCPs are highly effective when taken exactly as prescribed. However, OCPs do not prevent sexually transmitted infections (STIs). Safe sex practices, such as using condoms while on an OCP, can help prevent STIs. Before taking  OCPs, you may have a physical exam, blood test, and Pap test. A Pap test involves taking a sample of cells from your cervix to check for cancer. Discuss with your health care provider the possible side effects of the OCP you may be prescribed. When you start an OCP, be aware that it can take 2-3 months for your body to adjust to changes in hormone levels. How to take oral contraceptive pills Follow instructions from your health care provider about how to start taking your first cycle of OCPs. Your health care provider may recommend that you:  Start the pill on day 1 of your menstrual period. If you start at this time, you will not need any backup form of birth control (contraception), such as condoms.  Start the pill on the first Sunday after your menstrual period or on the day you get your prescription. In these cases, you will need to use backup contraception for the first week.  Start the pill at any time of your cycle. ? If you take the pill within 5 days of the start of your period, you will not need a backup form of contraception. ? If you start at any other time of your menstrual cycle, you will need to use another form of contraception for 7 days. If your OCP is the type called a minipill, it will protect you from pregnancy after taking it for 2 days (48 hours), and you can stop using backup contraception after that time. After you have started  taking OCPs:  If you forget to take 1 pill, take it as soon as you remember. Take the next pill at the regular time.  If you miss 2 or more pills, call your health care provider. Different pills have different instructions for missed doses. Use backup birth control until your next menstrual period starts.  If you use a 28-day pack that contains inactive pills and you miss 1 of the last 7 pills (pills with no hormones), throw away the rest of the non-hormone pills and start a new pill pack. No matter which day you start the OCP, you will always start a new pack on that same day of the week. Have an extra pack of OCPs and a backup contraceptive method available in case you miss some pills or lose your OCP pack. Follow these instructions at home:  Do not use any products that contain nicotine or tobacco, such as cigarettes and e-cigarettes. If you need help quitting, ask your health care provider.  Always use a condom to protect against STIs. OCPs do not protect against STIs.  Use a calendar to mark the days of your menstrual period.  Read the information and directions that came with your OCP. Talk to your health care provider if you have questions. Contact a health care provider if:  You develop nausea and vomiting.  You have abnormal vaginal discharge or bleeding.  You develop a rash.  You miss your menstrual period. Depending on the type of OCP you are taking, this may be a sign of pregnancy. Ask your health care provider for more information.  You are losing your hair.  You need treatment for mood swings or depression.  You get dizzy when taking the OCP.  You develop acne after taking the OCP.  You become pregnant or think you may be pregnant.  You have diarrhea, constipation, and abdominal pain or cramps.  You miss 2 or more pills. Get help right away if:  You develop chest   pain.  You develop shortness of breath.  You have an uncontrolled or severe headache.  You  develop numbness or slurred speech.  You develop visual or speech problems.  You develop pain, redness, and swelling in your legs.  You develop weakness or numbness in your arms or legs. Summary  Oral contraceptive pills (OCPs) are medicines that you take to prevent pregnancy.  OCPs do not prevent sexually transmitted infections (STIs). Always use a condom to protect against STIs.  When you start an OCP, be aware that it can take 2-3 months for your body to adjust to changes in hormone levels.  Read all the information and directions that come with your OCP. This information is not intended to replace advice given to you by your health care provider. Make sure you discuss any questions you have with your health care provider. Document Released: 06/23/2011 Document Revised: 10/26/2018 Document Reviewed: 08/15/2016 Elsevier Patient Education  2020 [employer].

## 2019-05-16 LAB — VAGINITIS/VAGINOSIS, DNA PROBE
Candida Species: NEGATIVE
Gardnerella vaginalis: POSITIVE — AB
Trichomonas vaginosis: NEGATIVE

## 2019-05-16 LAB — HIV ANTIBODY (ROUTINE TESTING W REFLEX): HIV Screen 4th Generation wRfx: NONREACTIVE

## 2019-05-16 LAB — RPR: RPR Ser Ql: NONREACTIVE

## 2019-05-16 LAB — HEPATITIS C ANTIBODY: Hep C Virus Ab: <0.1 s/co ratio (ref 0.0–0.9)

## 2019-05-16 LAB — TSH: TSH: 2.77 u[IU]/mL (ref 0.450–4.500)

## 2019-05-17 ENCOUNTER — Telehealth: Payer: Self-pay

## 2019-05-17 MED ORDER — METRONIDAZOLE 500 MG PO TABS: 500.0000 mg | ORAL_TABLET | Freq: Two times a day (BID) | ORAL | 0 refills | Status: DC

## 2019-05-17 NOTE — Telephone Encounter (Signed)
Left message for call back.

## 2019-05-17 NOTE — Telephone Encounter (Signed)
-----   Message from Regina Eck, CNM sent at 05/16/2019 12:56 PM EDT ----- Notify patient her vaginal screening was positive for BV only, yeast and trichomonas were negative  Will need Rx Flagyl 500 mg bid x 7 avoid ETOH during use, if concerns with pregnancy will need to wait until menses to treat, just given Rx for OCP HIV, RPR, Hep C negative Pap and GC,Chlamydia pending

## 2019-05-17 NOTE — Telephone Encounter (Signed)
Patient notified of results & flagyl sent to pharmacy.

## 2019-05-20 ENCOUNTER — Telehealth: Payer: Self-pay | Admitting: Certified Nurse Midwife

## 2019-05-20 NOTE — Telephone Encounter (Signed)
Patient is calling regarding results.

## 2019-05-20 NOTE — Telephone Encounter (Signed)
Spoke with patient. Advised 05/15/19 pap results not back yet, can take at least 7 days. Advised our office will notify you of results once completed and reviewed by provider. Patient verbalizes understanding and is agreeable.   Encounter closed.

## 2019-05-22 ENCOUNTER — Other Ambulatory Visit: Payer: Self-pay | Admitting: *Deleted

## 2019-05-22 LAB — CYTOLOGY - PAP
Chlamydia: POSITIVE — AB
Comment: NEGATIVE
Comment: NORMAL
Diagnosis: NEGATIVE
Neisseria Gonorrhea: NEGATIVE

## 2019-05-22 MED ORDER — AZITHROMYCIN 250 MG PO TABS: 1000.0000 mg | ORAL_TABLET | Freq: Once | ORAL | 0 refills | Status: AC

## 2019-06-07 ENCOUNTER — Other Ambulatory Visit: Payer: Self-pay

## 2019-06-07 DIAGNOSIS — Z20822 Contact with and (suspected) exposure to covid-19: Secondary | ICD-10-CM

## 2019-06-10 LAB — NOVEL CORONAVIRUS, NAA: SARS-CoV-2, NAA: NOT DETECTED

## 2019-08-16 ENCOUNTER — Telehealth: Payer: Self-pay | Admitting: Certified Nurse Midwife

## 2019-08-16 NOTE — Telephone Encounter (Signed)
Patient cancelled her [facility] appointment because she started new job and will call back to reschedule.

## 2019-08-21 ENCOUNTER — Ambulatory Visit: Payer: Self-pay | Admitting: Certified Nurse Midwife

## 2019-08-27 ENCOUNTER — Telehealth: Payer: Self-pay

## 2019-08-27 NOTE — Telephone Encounter (Signed)
-----   Message from Verner Chol, CNM sent at 08/16/2019 12:20 PM EST ----- Regarding: TOC chlamydia Cx appt. Will need to follow up for TOC appt

## 2019-08-27 NOTE — Telephone Encounter (Signed)
Spoke to patient regarding her toc appt for std. She states she doesn't have any health insurance right now & will have to come in to get it done later. Patient is aware to call the health department to see about getting it done there.

## 2019-08-29 NOTE — Telephone Encounter (Signed)
We need to follow this patient since she can not come in for [facility]

## 2019-09-02 NOTE — Telephone Encounter (Signed)
Spoke with patient. Patient has started a new job, request to schedule 09/16/19 when her insurance is effective. Patient declines to go to heath department for testing. Patient denies any GYN symptoms or concerns. OV scheduled for 09/16/19 at 1:30pm with Leota Sauers, CNM. Patient is agreeable to date and time.   Routing to provider for final review. Patient is agreeable to disposition. Will close encounter.

## 2019-09-16 ENCOUNTER — Ambulatory Visit: Payer: Self-pay | Admitting: Certified Nurse Midwife

## 2019-09-16 ENCOUNTER — Encounter: Payer: Self-pay | Admitting: Certified Nurse Midwife

## 2019-09-30 ENCOUNTER — Encounter: Payer: Self-pay | Admitting: Certified Nurse Midwife

## 2019-10-02 ENCOUNTER — Encounter: Payer: Self-pay | Admitting: Certified Nurse Midwife

## 2019-12-26 ENCOUNTER — Ambulatory Visit
Admission: EM | Admit: 2019-12-26 | Discharge: 2019-12-26 | Disposition: A | Discharge status: Home / Self Care | Payer: Medicaid Other | Attending: Physician Assistant | Admitting: Physician Assistant

## 2019-12-26 DIAGNOSIS — R197 Diarrhea, unspecified: Secondary | ICD-10-CM | POA: Diagnosis not present

## 2019-12-26 DIAGNOSIS — R112 Nausea with vomiting, unspecified: Secondary | ICD-10-CM | POA: Diagnosis not present

## 2019-12-26 MED ORDER — ONDANSETRON 4 MG PO TBDP: 4.0000 mg | ORAL_TABLET | Freq: Three times a day (TID) | ORAL | 0 refills | Status: DC | PRN

## 2019-12-26 MED ORDER — DICYCLOMINE HCL 20 MG PO TABS
20.0000 mg | ORAL_TABLET | Freq: Two times a day (BID) | ORAL | 0 refills | Status: DC
Start: 2019-12-26 — End: 2020-02-19

## 2019-12-26 NOTE — ED Triage Notes (Signed)
Pt c/o n/v/d since Saturday. States diarrhea has got worse, hasn't vomit since Tuesday. States had a neg rapid covid test yesterday.

## 2019-12-26 NOTE — Discharge Instructions (Signed)
Zofran for nausea and vomiting as needed. Bentyl for abdominal cramping. Keep hydrated, you urine should be clear to pale yellow in color. Bland diet, advance as tolerated. Monitor for any worsening of symptoms, nausea or vomiting not controlled by medication, worsening abdominal pain, fever, go to the emergency department for further evaluation needed.  °
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
## 2020-02-19 ENCOUNTER — Other Ambulatory Visit (HOSPITAL_COMMUNITY)
Admission: RE | Admit: 2020-02-19 | Discharge: 2020-02-19 | Disposition: A | Discharge status: Home / Self Care | Payer: Medicaid Other | Source: Ambulatory Visit | Attending: Obstetrics and Gynecology | Admitting: Obstetrics and Gynecology

## 2020-02-19 ENCOUNTER — Other Ambulatory Visit: Payer: Self-pay

## 2020-02-19 ENCOUNTER — Ambulatory Visit: Payer: Medicaid Other | Admitting: Obstetrics and Gynecology

## 2020-02-19 ENCOUNTER — Encounter: Payer: Self-pay | Admitting: Obstetrics and Gynecology

## 2020-02-19 VITALS — BP 108/70 | HR 80 | Ht 60.75 in | Wt 234.0 lb

## 2020-02-19 DIAGNOSIS — N760 Acute vaginitis: Secondary | ICD-10-CM

## 2020-02-19 DIAGNOSIS — Z3009 Encounter for other general counseling and advice on contraception: Secondary | ICD-10-CM | POA: Diagnosis not present

## 2020-02-19 DIAGNOSIS — Z113 Encounter for screening for infections with a predominantly sexual mode of transmission: Secondary | ICD-10-CM | POA: Insufficient documentation

## 2020-02-19 MED ORDER — NORETHINDRONE ACET-ETHINYL EST 1.5-30 MG-MCG PO TABS: 1.0000 | ORAL_TABLET | Freq: Every day | ORAL | 2 refills | Status: DC

## 2020-02-20 ENCOUNTER — Telehealth: Payer: Self-pay | Admitting: Obstetrics and Gynecology

## 2020-02-20 LAB — HSV-2 IGG SUPPLEMENTAL TEST: HSV-2 IgG Supplemental Test: POSITIVE — AB

## 2020-02-20 LAB — HEP, RPR, HIV PANEL
HIV Screen 4th Generation wRfx: NONREACTIVE
Hepatitis B Surface Ag: NEGATIVE
RPR Ser Ql: NONREACTIVE

## 2020-02-20 LAB — HSV(HERPES SIMPLEX VRS) I + II AB-IGG
HSV 1 Glycoprotein G Ab, IgG: 4.54 index — ABNORMAL HIGH (ref 0.00–0.90)
HSV 2 IgG, Type Spec: 4.69 index — ABNORMAL HIGH (ref 0.00–0.90)

## 2020-02-20 LAB — CERVICOVAGINAL ANCILLARY ONLY
Bacterial Vaginitis (gardnerella): POSITIVE — AB
Candida Glabrata: NEGATIVE
Candida Vaginitis: NEGATIVE
Chlamydia: POSITIVE — AB
Comment: NEGATIVE
Comment: NEGATIVE
Comment: NEGATIVE
Comment: NEGATIVE
Comment: NEGATIVE
Comment: NORMAL
Neisseria Gonorrhea: NEGATIVE
Trichomonas: POSITIVE — AB

## 2020-02-20 LAB — HEPATITIS C ANTIBODY: Hep C Virus Ab: <0.1 s/co ratio (ref 0.0–0.9)

## 2020-02-20 MED ORDER — AZITHROMYCIN 250 MG PO TABS
1000.0000 mg | ORAL_TABLET | Freq: Once | ORAL | 0 refills | Status: AC
Start: 2020-02-20 — End: 2020-02-20

## 2020-02-20 MED ORDER — METRONIDAZOLE 500 MG PO TABS
500.0000 mg | ORAL_TABLET | Freq: Two times a day (BID) | ORAL | 0 refills | Status: DC
Start: 2020-02-20 — End: 2021-10-24

## 2020-02-20 NOTE — Telephone Encounter (Signed)
See result note.  

## 2020-02-20 NOTE — Telephone Encounter (Signed)
Patient is calling to speak with the nurse about her test results.

## 2020-02-20 NOTE — Telephone Encounter (Signed)
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   She tested negative for HIV, syphilis, hepatitis B and C, and gonorrhea.   I recommend treatment as follows:  - Azithromycin 1 gram orally to treat the chlamydia.  - Flagyl 500 mg po bid x 7 days to treat the BV and trichomonas.   Ok to offer expedited partner therapy for trichomonas and chlamydia if desired.   I recommend she abstain from intercourse for at least one week after the last partner is treated.   Condoms are recommended.   She will need retesting in 3 months.  Please schedule this appointment.   I recommend an office visit sooner to discuss herpes and possible treatment options for this as well.   Please report infections to the health department.

## 2020-02-20 NOTE — Telephone Encounter (Signed)
Call returned to patient. Left message advised her message was received. Our office will return call to go over results and recommendations once results have been reviewed by Dr.Silva.

## 2020-02-20 NOTE — Telephone Encounter (Signed)
Dr. Edward Jolly -please review 02/20/20 results and advise.

## 2020-02-20 NOTE — Telephone Encounter (Signed)
Spoke with pt. Pt given results and recommendations per Dr Edward Jolly. Pt agreeable and verbalized understanding. Pt agreeable to EPT as well. Information received. Rx written for EPT, reviewed and signed by Dr Edward Jolly.  Pt to pick up, placed at front office.  Pt has more questions and wanting treatment for HSV. Pt scheduled OV to discuss with Dr Edward Jolly on 8/13 at 10 am. Pt agreeable to date and time of appt.   Rx sent for Azithromycin and Flagyl for pt. Pharmacy verified.  Pt has 3 month appt on 11/17 at 1130 am for TOC. Pt agreeable to date and time.   [facility] notified via fax.   Routing to Dr Edward Jolly for review.  Encounter closed.
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
## 2020-02-28 ENCOUNTER — Encounter: Payer: Self-pay | Admitting: Obstetrics and Gynecology

## 2020-02-28 ENCOUNTER — Other Ambulatory Visit: Payer: Self-pay

## 2020-02-28 ENCOUNTER — Ambulatory Visit: Payer: Medicaid Other | Admitting: Obstetrics and Gynecology

## 2020-02-28 VITALS — BP 118/72 | HR 72 | Resp 14 | Ht 60.75 in | Wt 233.0 lb

## 2020-02-28 DIAGNOSIS — A599 Trichomoniasis, unspecified: Secondary | ICD-10-CM | POA: Diagnosis not present

## 2020-02-28 DIAGNOSIS — B009 Herpesviral infection, unspecified: Secondary | ICD-10-CM | POA: Diagnosis not present

## 2020-02-28 DIAGNOSIS — A749 Chlamydial infection, unspecified: Secondary | ICD-10-CM | POA: Diagnosis not present

## 2020-02-28 MED ORDER — VALACYCLOVIR HCL 500 MG PO TABS
500.0000 mg | ORAL_TABLET | Freq: Every day | ORAL | 0 refills | Status: DC
Start: 2020-02-28 — End: 2020-02-28

## 2020-02-28 MED ORDER — VALACYCLOVIR HCL 500 MG PO TABS: 500.0000 mg | ORAL_TABLET | Freq: Every day | ORAL | 0 refills | Status: DC

## 2020-05-15 ENCOUNTER — Ambulatory Visit: Payer: Self-pay | Admitting: Certified Nurse Midwife
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
## 2020-06-03 ENCOUNTER — Ambulatory Visit: Payer: Self-pay | Admitting: Obstetrics and Gynecology

## 2020-06-25 ENCOUNTER — Ambulatory Visit: Payer: Medicaid Other | Admitting: Obstetrics and Gynecology
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
## 2020-07-29 ENCOUNTER — Ambulatory Visit: Payer: Medicaid Other | Admitting: Obstetrics and Gynecology

## 2020-08-20 ENCOUNTER — Ambulatory Visit: Payer: Self-pay | Admitting: Obstetrics and Gynecology

## 2020-10-07 ENCOUNTER — Emergency Department (HOSPITAL_COMMUNITY)
Admission: EM | Admit: 2020-10-07 | Discharge: 2020-10-07 | Discharge status: Against Medical Advice | Payer: Medicaid Other

## 2020-10-07 NOTE — ED Notes (Signed)
Called x 3 no answer 

## 2020-10-07 NOTE — ED Notes (Signed)
Called 1x. No answer.

## 2020-11-24 ENCOUNTER — Emergency Department (HOSPITAL_COMMUNITY): Payer: Medicaid Other

## 2020-11-24 ENCOUNTER — Emergency Department (HOSPITAL_COMMUNITY)
Admission: EM | Admit: 2020-11-24 | Discharge: 2020-11-24 | Disposition: A | Discharge status: Home / Self Care | Payer: Medicaid Other | Attending: Emergency Medicine | Admitting: Emergency Medicine

## 2020-11-24 ENCOUNTER — Other Ambulatory Visit: Payer: Self-pay

## 2020-11-24 ENCOUNTER — Encounter (HOSPITAL_COMMUNITY): Payer: Self-pay | Admitting: Emergency Medicine

## 2020-11-24 DIAGNOSIS — R519 Headache, unspecified: Secondary | ICD-10-CM | POA: Diagnosis present

## 2020-11-24 DIAGNOSIS — M542 Cervicalgia: Secondary | ICD-10-CM | POA: Diagnosis not present

## 2020-11-24 DIAGNOSIS — Z87891 Personal history of nicotine dependence: Secondary | ICD-10-CM | POA: Insufficient documentation

## 2020-11-24 DIAGNOSIS — R509 Fever, unspecified: Secondary | ICD-10-CM | POA: Insufficient documentation

## 2020-11-24 MED ORDER — ACETAMINOPHEN 500 MG PO TABS
1000.0000 mg | ORAL_TABLET | Freq: Once | ORAL | Status: AC
  Administered 2020-11-24: 1000 mg via ORAL
  Filled 2020-11-24: qty 2

## 2020-11-24 MED ORDER — CYCLOBENZAPRINE HCL 10 MG PO TABS
10.0000 mg | ORAL_TABLET | Freq: Three times a day (TID) | ORAL | 0 refills | Status: DC | PRN
Start: 2020-11-24 — End: 2021-10-24

## 2020-11-24 NOTE — ED Notes (Signed)
dykstra MD at bedside. No labs/scans needed at this time.

## 2020-11-24 NOTE — Discharge Instructions (Signed)
If you have any worsening of your pain, any vision change, speech change, dizziness or other new concerning symptoms, please return immediately to ER for reassessment.  Recommend taking Tylenol, Motrin as needed for pain control.  Can also try the muscle relaxer as needed.  Note this can make you mildly drowsy and stopped taking while driving or operating heavy machinery.

## 2020-11-24 NOTE — ED Provider Notes (Signed)
Emergency Medicine Provider Triage Evaluation Note  Nicole Mclean , a 25 y.o. female  was evaluated in triage.  Pt complains of left sided temporal headache onset 2 days ago. Described as sharp shooting severe.  Worse if she looks or turns her head to the left.  No history of headaches.  Has taken high doses of ibuprofen/tylenol without relief. Today she had sudden sharp severe pain in left head that made her left eye vision completely white for a few seconds. Now vision normal.  +Photosensitivity.    Review of Systems  Positive: Headache photophobia Negative: fever  Physical Exam  BP 112/65   Pulse 87   Temp 97.8 F (36.6 C) (Oral)   Resp 16   SpO2 98%  Gen:   Awake, no distress   Resp:  Normal effort MSK:   Moves extremities without difficulty  HENT:  Left parietal/temporal scalp tenderness Neck:  Pain in neck with left neck bend/rotation, no meningismus  Neuro:  CN II-XII intact. No facial droop. Symmetric hand grip and leg raise bilaterally  Medical Decision Making  Medically screening exam initiated at 4:52 PM.  Appropriate orders placed.  Nicole Mclean was informed that the remainder of the evaluation will be completed by another provider, this initial triage assessment does not replace that evaluation, and the importance of remaining in the ED until their evaluation is complete.  Will get CT head, tylenol. Normal neuro exam. Some red flags given new onset severe headache, visual changes, photophobia.  However no fever, meningismus, obvious neuro deficit Consider vascular study based on reevaluation    Liberty Handy, PA-C 11/24/20 1655    Virgina Norfolk, DO 11/25/20 [phone]
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
## 2020-11-24 NOTE — ED Triage Notes (Signed)
Pt c/o left sided head pain x 1 day. States she has had headaches, but this feels different. Pain worse with head movement, denies nausea/vomiting, c/o blurriness to left eye when pain is most intense.

## 2020-12-25 ENCOUNTER — Other Ambulatory Visit: Payer: Self-pay

## 2020-12-25 ENCOUNTER — Emergency Department (HOSPITAL_COMMUNITY)
Admission: EM | Admit: 2020-12-25 | Discharge: 2020-12-25 | Disposition: A | Discharge status: Home / Self Care | Payer: BC Managed Care – PPO | Attending: Physician Assistant | Admitting: Physician Assistant

## 2020-12-25 DIAGNOSIS — R300 Dysuria: Secondary | ICD-10-CM | POA: Insufficient documentation

## 2020-12-25 DIAGNOSIS — Z5321 Procedure and treatment not carried out due to patient leaving prior to being seen by health care provider: Secondary | ICD-10-CM | POA: Diagnosis not present

## 2020-12-25 DIAGNOSIS — R350 Frequency of micturition: Secondary | ICD-10-CM | POA: Insufficient documentation

## 2020-12-25 DIAGNOSIS — Z3200 Encounter for pregnancy test, result unknown: Secondary | ICD-10-CM | POA: Diagnosis not present

## 2020-12-25 LAB — URINALYSIS, ROUTINE W REFLEX MICROSCOPIC
Bilirubin Urine: NEGATIVE
Glucose, UA: NEGATIVE mg/dL
Hgb urine dipstick: NEGATIVE
Ketones, ur: NEGATIVE mg/dL
Leukocytes,Ua: NEGATIVE
Nitrite: NEGATIVE
Protein, ur: NEGATIVE mg/dL
Specific Gravity, Urine: 1.011 (ref 1.005–1.030)
pH: 6 (ref 5.0–8.0)

## 2020-12-25 LAB — PREGNANCY, URINE: Preg Test, Ur: NEGATIVE

## 2020-12-25 NOTE — ED Provider Notes (Signed)
Emergency Medicine Provider Triage Evaluation Note  GENTRI GUARDADO , a 25 y.o. female  was evaluated in triage.  Pt complains of dysuria, frequency, urgency for 1 week. Denies hematuria.  Review of Systems  Positive: Dysuria, frequency, urgency Negative: hematuria  Physical Exam  BP 132/86 (BP Location: Left Arm)   Pulse 97   Temp 98.7 F (37.1 C) (Oral)   Resp 16   SpO2 100%  Gen:   Awake, no distress   Resp:  Normal effort  MSK:   Moves extremities without difficulty   Medical Decision Making  Medically screening exam initiated at 2:20 PM.  Appropriate orders placed.  Mckinleigh E Veldman was informed that the remainder of the evaluation will be completed by another provider, this initial triage assessment does not replace that evaluation, and the importance of remaining in the ED until their evaluation is complete.     Karrie Meres, PA-C 12/25/20 1421    Vanetta Mulders, MD 12/26/20 [phone]

## 2020-12-25 NOTE — ED Notes (Signed)
Pt states she cannot wait any longer since she has to pick up her kids. Seen leaving ED.

## 2020-12-25 NOTE — ED Triage Notes (Signed)
Pt reports frequent urination x 1.5 weeks. Also requesting pregnancy test, LMP 4/20.

## 2021-05-22 ENCOUNTER — Other Ambulatory Visit: Payer: Self-pay

## 2021-05-22 ENCOUNTER — Emergency Department (HOSPITAL_COMMUNITY)
Admission: EM | Admit: 2021-05-22 | Discharge: 2021-05-23 | Disposition: A | Discharge status: Home / Self Care | Payer: No Typology Code available for payment source | Attending: Emergency Medicine | Admitting: Emergency Medicine

## 2021-05-22 ENCOUNTER — Encounter (HOSPITAL_COMMUNITY): Payer: Self-pay | Admitting: Emergency Medicine

## 2021-05-22 DIAGNOSIS — E876 Hypokalemia: Secondary | ICD-10-CM | POA: Insufficient documentation

## 2021-05-22 DIAGNOSIS — R059 Cough, unspecified: Secondary | ICD-10-CM | POA: Insufficient documentation

## 2021-05-22 DIAGNOSIS — Z20822 Contact with and (suspected) exposure to covid-19: Secondary | ICD-10-CM | POA: Diagnosis not present

## 2021-05-22 DIAGNOSIS — R072 Precordial pain: Secondary | ICD-10-CM | POA: Insufficient documentation

## 2021-05-22 DIAGNOSIS — Z87891 Personal history of nicotine dependence: Secondary | ICD-10-CM | POA: Insufficient documentation

## 2021-05-22 NOTE — ED Provider Notes (Signed)
Emergency Medicine Provider Triage Evaluation Note  Nicole Mclean , a 25 y.o. female  was evaluated in triage.  Pt complains of chest pain and dizziness.  Describes central chest pain for the past few days.  Has been worsening.  Now she has begun feeling dizzy.  Denies fever or chills.  Denies leg swelling.  Review of Systems  Positive: CP, dizzy Negative: Fever, chills  Physical Exam  There were no vitals taken for this visit. Gen:   Awake, no distress   Resp:  Normal effort  MSK:   Moves extremities without difficulty  Other:    Medical Decision Making  Medically screening exam initiated at 11:55 PM.  Appropriate orders placed.  Nicole Mclean was informed that the remainder of the evaluation will be completed by another provider, this initial triage assessment does not replace that evaluation, and the importance of remaining in the ED until their evaluation is complete.  Worsening chest pain, near syncope   Roxy Horseman, PA-C 05/22/21 2356    Melene Plan, DO 05/23/21 0021

## 2021-05-22 NOTE — ED Triage Notes (Signed)
Pt c/o central chest pain that started on Thursday and dizziness that started today.

## 2021-05-23 ENCOUNTER — Emergency Department (HOSPITAL_COMMUNITY): Payer: No Typology Code available for payment source

## 2021-05-23 DIAGNOSIS — R072 Precordial pain: Secondary | ICD-10-CM | POA: Diagnosis not present

## 2021-05-23 LAB — CBC WITH DIFFERENTIAL/PLATELET
Abs Immature Granulocytes: 0.02 10*3/uL (ref 0.00–0.07)
Basophils Absolute: 0.1 10*3/uL (ref 0.0–0.1)
Basophils Relative: 1 %
Eosinophils Absolute: 0.1 10*3/uL (ref 0.0–0.5)
Eosinophils Relative: 1 %
HCT: 35 % — ABNORMAL LOW (ref 36.0–46.0)
Hemoglobin: 11.7 g/dL — ABNORMAL LOW (ref 12.0–15.0)
Immature Granulocytes: 0 %
Lymphocytes Relative: 38 %
Lymphs Abs: 3.8 10*3/uL (ref 0.7–4.0)
MCH: 29.4 pg (ref 26.0–34.0)
MCHC: 33.4 g/dL (ref 30.0–36.0)
MCV: 87.9 fL (ref 80.0–100.0)
Monocytes Absolute: 0.5 10*3/uL (ref 0.1–1.0)
Monocytes Relative: 5 %
Neutro Abs: 5.5 10*3/uL (ref 1.7–7.7)
Neutrophils Relative %: 55 %
Platelets: 308 10*3/uL (ref 150–400)
RBC: 3.98 MIL/uL (ref 3.87–5.11)
RDW: 12.2 % (ref 11.5–15.5)
WBC: 10.1 10*3/uL (ref 4.0–10.5)
nRBC: 0 % (ref 0.0–0.2)

## 2021-05-23 LAB — I-STAT BETA HCG BLOOD, ED (MC, WL, AP ONLY): I-stat hCG, quantitative: <5 m[IU]/mL (ref <5)

## 2021-05-23 LAB — TROPONIN I (HIGH SENSITIVITY)
Troponin I (High Sensitivity): 4 ng/L (ref <18)
Troponin I (High Sensitivity): 5 ng/L (ref <18)

## 2021-05-23 LAB — BASIC METABOLIC PANEL
Anion gap: 8 (ref 5–15)
BUN: 7 mg/dL (ref 6–20)
CO2: 26 mmol/L (ref 22–32)
Calcium: 9.5 mg/dL (ref 8.9–10.3)
Chloride: 103 mmol/L (ref 98–111)
Creatinine, Ser: 0.68 mg/dL (ref 0.44–1.00)
GFR, Estimated: >60 mL/min (ref >60)
Glucose, Bld: 95 mg/dL (ref 70–99)
Potassium: 3.4 mmol/L — ABNORMAL LOW (ref 3.5–5.1)
Sodium: 137 mmol/L (ref 135–145)

## 2021-05-23 LAB — D-DIMER, QUANTITATIVE: D-Dimer, Quant: <0.27 ug/mL-FEU (ref 0.00–0.50)

## 2021-05-23 LAB — RESP PANEL BY RT-PCR (FLU A&B, COVID) ARPGX2
Influenza A by PCR: NEGATIVE
Influenza B by PCR: NEGATIVE
SARS Coronavirus 2 by RT PCR: NEGATIVE

## 2021-05-23 MED ORDER — NAPROXEN 500 MG PO TABS: 500.0000 mg | ORAL_TABLET | Freq: Two times a day (BID) | ORAL | 0 refills | Status: DC
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
## 2021-05-23 NOTE — Discharge Instructions (Addendum)
Take the anti-inflammatory medication.  Make sure to drink plenty of fluids, rest.  As discussed in the room your potassium was mildly low.  I would try to increase your potassium rich foods.  There is a handout in your contact information for some foods ideas.  I would recommend follow-up with your primary care provider in 24 to 48 hours  Return for new or worsening symptoms such as worsening chest pain, passing out, numbness, weakness, diaphoresis

## 2021-10-24 ENCOUNTER — Encounter: Payer: Self-pay | Admitting: Emergency Medicine

## 2021-10-24 ENCOUNTER — Other Ambulatory Visit: Payer: Self-pay

## 2021-10-24 ENCOUNTER — Ambulatory Visit
Admission: EM | Admit: 2021-10-24 | Discharge: 2021-10-24 | Disposition: A | Discharge status: Home / Self Care | Payer: No Typology Code available for payment source

## 2021-10-24 DIAGNOSIS — J020 Streptococcal pharyngitis: Secondary | ICD-10-CM

## 2021-10-24 LAB — POCT RAPID STREP A (OFFICE): Rapid Strep A Screen: POSITIVE — AB

## 2021-10-24 MED ORDER — AMOXICILLIN 500 MG PO CAPS: 500.0000 mg | ORAL_CAPSULE | Freq: Two times a day (BID) | ORAL | 0 refills | Status: AC

## 2021-10-24 NOTE — ED Triage Notes (Signed)
Patient's son was recently diagnosed with strep throat a couple of days ago.  Now patient is having a sore throat x 1 week.  Voice hoarseness. Cough has worsened. ?

## 2021-10-24 NOTE — Discharge Instructions (Addendum)
-  Start the antibiotic-Amoxicillin, 1 pill every 12 hours for 10 days.  You can take this with food like with breakfast and dinner. ?-You can continue tylenol/ibuprofen for discomfort, and make sure to drink plenty of fluids ?-You'll still be contagious for 24 hours after starting the antibiotic. This means you can go back to work in 1 day.  ?-Make sure to throw out your toothbrush after 24 hours so you don't give the strep back to yourself.  ?-Seek additional medical attention if symptoms are getting worse instead of better- trouble swallowing, shortness of breath, voice changes, etc. ? ?
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
## 2021-10-25 ENCOUNTER — Ambulatory Visit: Payer: Self-pay

## 2021-10-25 ENCOUNTER — Telehealth: Payer: Self-pay

## 2022-03-24 ENCOUNTER — Ambulatory Visit
Admission: EM | Admit: 2022-03-24 | Discharge: 2022-03-24 | Disposition: A | Discharge status: Home / Self Care | Payer: 59

## 2022-06-02 ENCOUNTER — Inpatient Hospital Stay (HOSPITAL_COMMUNITY)
Admission: AD | Admit: 2022-06-02 | Discharge: 2022-06-02 | Disposition: A | Discharge status: Home / Self Care | Payer: No Typology Code available for payment source | Attending: Obstetrics and Gynecology | Admitting: Obstetrics and Gynecology

## 2022-06-02 ENCOUNTER — Encounter (HOSPITAL_COMMUNITY): Payer: Self-pay | Admitting: Emergency Medicine

## 2022-06-02 ENCOUNTER — Other Ambulatory Visit: Payer: Self-pay

## 2022-06-02 ENCOUNTER — Ambulatory Visit (HOSPITAL_COMMUNITY)
Admission: EM | Admit: 2022-06-02 | Discharge: 2022-06-02 | Disposition: A | Discharge status: Home / Self Care | Payer: No Typology Code available for payment source | Attending: Sports Medicine | Admitting: Sports Medicine

## 2022-06-02 ENCOUNTER — Encounter (HOSPITAL_COMMUNITY): Payer: Self-pay | Admitting: Obstetrics and Gynecology

## 2022-06-02 ENCOUNTER — Inpatient Hospital Stay (HOSPITAL_COMMUNITY): Payer: No Typology Code available for payment source

## 2022-06-02 DIAGNOSIS — R1030 Lower abdominal pain, unspecified: Secondary | ICD-10-CM | POA: Diagnosis not present

## 2022-06-02 DIAGNOSIS — O209 Hemorrhage in early pregnancy, unspecified: Secondary | ICD-10-CM | POA: Diagnosis present

## 2022-06-02 DIAGNOSIS — O4690 Antepartum hemorrhage, unspecified, unspecified trimester: Secondary | ICD-10-CM

## 2022-06-02 DIAGNOSIS — N939 Abnormal uterine and vaginal bleeding, unspecified: Secondary | ICD-10-CM

## 2022-06-02 DIAGNOSIS — Z3A01 Less than 8 weeks gestation of pregnancy: Secondary | ICD-10-CM | POA: Diagnosis not present

## 2022-06-02 DIAGNOSIS — O3680X0 Pregnancy with inconclusive fetal viability, not applicable or unspecified: Secondary | ICD-10-CM | POA: Diagnosis not present

## 2022-06-02 DIAGNOSIS — Z3201 Encounter for pregnancy test, result positive: Secondary | ICD-10-CM

## 2022-06-02 LAB — CBC
HCT: 36.4 % (ref 36.0–46.0)
Hemoglobin: 12 g/dL (ref 12.0–15.0)
MCH: 29.1 pg (ref 26.0–34.0)
MCHC: 33 g/dL (ref 30.0–36.0)
MCV: 88.3 fL (ref 80.0–100.0)
Platelets: 303 10*3/uL (ref 150–400)
RBC: 4.12 MIL/uL (ref 3.87–5.11)
RDW: 12.4 % (ref 11.5–15.5)
WBC: 8.6 10*3/uL (ref 4.0–10.5)
nRBC: 0 % (ref 0.0–0.2)

## 2022-06-02 LAB — ABO/RH: ABO/RH(D): O POS

## 2022-06-02 LAB — URINALYSIS, ROUTINE W REFLEX MICROSCOPIC
Bacteria, UA: NONE SEEN
Bilirubin Urine: NEGATIVE
Glucose, UA: NEGATIVE mg/dL
Hgb urine dipstick: NEGATIVE
Ketones, ur: NEGATIVE mg/dL
Nitrite: NEGATIVE
Protein, ur: NEGATIVE mg/dL
Specific Gravity, Urine: 1.019 (ref 1.005–1.030)
pH: 6 (ref 5.0–8.0)

## 2022-06-02 LAB — POC URINE PREG, ED: Preg Test, Ur: POSITIVE — AB

## 2022-06-02 LAB — HCG, QUANTITATIVE, PREGNANCY: hCG, Beta Chain, Quant, S: 60 m[IU]/mL — ABNORMAL HIGH (ref <5)
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
## 2022-06-02 NOTE — ED Triage Notes (Signed)
Vaginal spotting and significant cramping and dizziness .  [facility] informed patient she needs a urine pregnancy test before they can see her.  Lmp 05/06/2022.  Patient has taken 6 home pregnancy tests and all positive

## 2022-06-02 NOTE — MAU Note (Signed)
...  Nicole Mclean is a 26 y.o. at [redacted]w[redacted]d here in MAU reporting: Sent over from Urgent Care. Patient reports she has been experiencing intermittent lower abdominal cramping, right mid to lower back pain, and vaginal bleeding for the past "3-4 days." She reports her bleeding has been light and initially it was a brighter red and is now light to dark brown. Denies blood clots. Patient very nervous that this is a miscarriage.  LMP: 05/06/2022 Onset of complaint: 3-4 days ago  Pain score:  3/10 lower abdomen 2/10 right mid to lower back  Vitals:   06/02/22 0955 06/02/22 1004  BP: 110/67 117/64  Pulse: 95   Resp: 20   Temp: 98.2 F (36.8 C)   SpO2: 98%      Lab orders placed from triage:  UA

## 2022-06-02 NOTE — MAU Provider Note (Signed)
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  Reports that LMP was 05/06/2022 and was a normal period.  Last sexually active 1 week ago.  Has taken home pregnancy test which were positive.  Went to the urgent care today because of the cramping and spotting and was sent to the MAU for further evaluation.  Reports nausea.  Denies any abnormal vaginal discharge, burning with urination, increased urinary frequency, headache, change in vision, chest pain.  Does not tenderness to palpation in the suprapubic area.    OB History     Gravida  4   Para  2   Term  2   Preterm  0   AB  1   Living  2      SAB  1   IAB  0   Ectopic  0   Multiple  0   Live Births  2           Past Medical History:  Diagnosis Date   Chlamydia 01/2016   Gonorrhea April 2015   Gonorrhea    Infection    UTI   STD (female)    Syphilis    UTI (urinary tract infection)     Past Surgical History:  Procedure Laterality Date   chin surgery     HAND SURGERY     Left  hand   mirena      mirena iud removed   RHYTIDECTOMY NECK / CHEEK / CHIN      Family History  Adopted: Yes  Problem Relation Age of Onset   Healthy Mother    Healthy Father    Learning disabilities Neg Hx    Mental illness Neg Hx    Mental retardation Neg Hx    Vision loss Neg Hx    Varicose Veins Neg Hx     Social History   Tobacco Use   Smoking status: Never    Passive exposure: Never   Smokeless tobacco: Never  Vaping Use   Vaping Use: Never used  Substance Use Topics   Alcohol use: No    Alcohol/week: 0.0 standard drinks of alcohol   Drug use: No    Allergies: No Known Allergies  Medications Prior to Admission  Medication Sig Dispense Refill Last Dose   Prenatal Vit-Fe  Fumarate-FA (PREPLUS) 27-1 MG TABS Take 1 tablet by mouth daily.   06/02/2022   phentermine (ADIPEX-P) 37.5 MG tablet Take 37.5 mg by mouth daily. (Patient not taking: Reported on 06/02/2022)      topiramate (TOPAMAX) 50 MG tablet Take 50 mg by mouth 2 (two) times daily. (Patient not taking: Reported on 06/02/2022)       Review of Systems  Constitutional:  Negative for fever.  HENT:  Positive for congestion. Negative for nosebleeds.   Eyes:  Negative for visual disturbance.  Respiratory:  Negative for shortness of breath.   Cardiovascular:  Negative for chest pain.  Gastrointestinal:  Positive for abdominal pain and nausea. Negative for constipation, diarrhea and vomiting.  Endocrine: Negative for polyuria.  Genitourinary:  Positive for vaginal bleeding. Negative for difficulty urinating, dysuria and vaginal discharge.  Musculoskeletal:  Negative for myalgias.  Neurological:  Negative for headaches.   Physical Exam  Blood pressure 117/64, pulse 80, temperature 98.2 F (36.8 C), temperature source Oral, resp. rate 20, height 5' (1.524 m), weight 105.2 kg, last menstrual period 05/06/2022, SpO2 98 %.  Physical Exam Vitals reviewed.  Constitutional:      Appearance: She is well-developed.  HENT:     Head: Normocephalic and atraumatic.     Mouth/Throat:     Mouth: Mucous membranes are moist.  Eyes:     Extraocular Movements: Extraocular movements intact.  Cardiovascular:     Rate and Rhythm: Normal rate.  Pulmonary:     Effort: Pulmonary effort is normal.  Abdominal:     General: Abdomen is flat.     Palpations: Abdomen is soft.     Tenderness: There is abdominal tenderness in the suprapubic area.  Skin:    General: Skin is warm.  Neurological:     Mental Status: She is alert.     MAU Course  Procedures  MDM Bedside ultrasound showing no IUP CBC  Beta-hCG quant Ultrasound  ABO O+  Assessment and Plan  Nicole Mclean is a 26 y/o [id number] at [redacted]w[redacted]d presenting for  abdominal cramping and vaginal spotting.  Abdominal pain Ectopic rule out. -CBC within normal limits - Ultrasound showing no IUP recommended serial hCG quant's and follow-up ultrasound if needed - Beta-hCG 60 - ABO O+, no need for RhoGAM Schedule patient for repeat beta-hCG in 2 days - Patient discharged home with strict return precautions and ectopic pregnancy precautions.  No further questions or concerns.     Celedonio Savage 06/02/2022, 10:19 AM

## 2022-06-02 NOTE — Discharge Instructions (Signed)
It was a pleasure taking care of you today.  I am sorry you are having this abdominal pain.  Your hCG levels indicate you are pregnant but ultrasound did not show any fetus.  We would like to repeat this lab work in 2 days.  I have scheduled you for that and you can come back to the MAU for the lab work.  You may need an ultrasound and 10 to 14 days.  If you have any worsening abdominal pain, vaginal bleeding please be reevaluated.  I Fumiko you have a wonderful day!

## 2022-06-02 NOTE — Discharge Instructions (Addendum)
Urine pregnancy test positive here today.  I suspect you may have had a miscarriage versus implantation bleeding. I recommend following hCG levels.  You may have that done here or at your primary care physician office but I understand you like to be evaluated at the [facility].

## 2022-06-04 ENCOUNTER — Inpatient Hospital Stay (HOSPITAL_COMMUNITY)
Admission: AD | Admit: 2022-06-04 | Discharge: 2022-06-04 | Disposition: A | Discharge status: Home / Self Care | Payer: No Typology Code available for payment source | Attending: Obstetrics & Gynecology | Admitting: Obstetrics & Gynecology

## 2022-06-04 ENCOUNTER — Inpatient Hospital Stay (HOSPITAL_COMMUNITY)
Admit: 2022-06-04 | Discharge: 2022-06-04 | Disposition: A | Discharge status: Home / Self Care | Payer: No Typology Code available for payment source | Attending: Obstetrics and Gynecology | Admitting: Obstetrics and Gynecology

## 2022-06-04 DIAGNOSIS — Z3A01 Less than 8 weeks gestation of pregnancy: Secondary | ICD-10-CM | POA: Diagnosis not present

## 2022-06-04 DIAGNOSIS — R1030 Lower abdominal pain, unspecified: Secondary | ICD-10-CM | POA: Diagnosis present

## 2022-06-04 DIAGNOSIS — O3680X0 Pregnancy with inconclusive fetal viability, not applicable or unspecified: Secondary | ICD-10-CM

## 2022-06-04 DIAGNOSIS — O26891 Other specified pregnancy related conditions, first trimester: Secondary | ICD-10-CM | POA: Diagnosis not present

## 2022-06-04 LAB — HCG, QUANTITATIVE, PREGNANCY: hCG, Beta Chain, Quant, S: 150 m[IU]/mL — ABNORMAL HIGH (ref <5)

## 2022-06-04 NOTE — MAU Note (Signed)
Nicole Mclean is a 25 y.o. at [redacted]w[redacted]d here in MAU reporting: here for f/u blood work.  No bleeding(stopped on 11/16), just cramping.   Onset of complaint: ongoing Pain score: very mild, 2 Vitals:   06/04/22 1056  BP: 137/69  Pulse: 95  Resp: 17  Temp: 98.1 F (36.7 C)  SpO2: 99%      Lab orders placed from triage:  HCG

## 2022-06-04 NOTE — MAU Provider Note (Signed)
Event Date/Time   First Provider Initiated Contact with Patient 06/04/22 1217      [name] is a 26 y.o. [id number] patient who presents to MAU today for repeat quant hCG. She was previously evaluated in MAU for vaginal spotting and abdominal cramping on 06/02/2022. Patient states her bleeding resolved that same day. She continues to experience mild abdominal cramping, unchanged from onset.   O BP 137/69 (BP Location: Right Arm)   Pulse 95   Temp 98.1 F (36.7 C) (Oral)   Resp 17   Ht 5' (1.524 m)   Wt 103.2 kg   LMP 05/06/2022   SpO2 99%   BMI 44.45 kg/m    Physical Exam Vitals and nursing note reviewed. Exam conducted with a chaperone present.  Constitutional:      Appearance: Normal appearance. She is not ill-appearing.  Cardiovascular:     Rate and Rhythm: Normal rate.  Pulmonary:     Effort: Pulmonary effort is normal.  Skin:    Capillary Refill: Capillary refill takes less than 2 seconds.  Neurological:     Mental Status: She is alert and oriented to person, place, and time.  Psychiatric:        Mood and Affect: Mood normal.        Behavior: Behavior normal.        Thought Content: Thought content normal.        Judgment: Judgment normal.     A Medical screening exam complete Appropriate rise in quant hCG Component     Latest Ref Rng 06/02/2022 06/04/2022  HCG, Beta Chain, Quant, S     <5 mIU/mL 60 (H)  150 (H)      P Discharge from MAU in stable condition with return precautions, confirmation letter given at discharge  Follow up Order placed for viability scan in 10-14 days from previous at [facility], [place], MSN, CNM 06/04/2022 1:01 PM

## 2022-06-12 ENCOUNTER — Inpatient Hospital Stay (HOSPITAL_COMMUNITY): Payer: No Typology Code available for payment source

## 2022-06-12 ENCOUNTER — Inpatient Hospital Stay (HOSPITAL_COMMUNITY)
Admission: AD | Admit: 2022-06-12 | Discharge: 2022-06-12 | Disposition: A | Discharge status: Home / Self Care | Payer: No Typology Code available for payment source | Attending: Obstetrics and Gynecology | Admitting: Obstetrics and Gynecology

## 2022-06-12 ENCOUNTER — Encounter (HOSPITAL_COMMUNITY): Payer: Self-pay | Admitting: Obstetrics and Gynecology

## 2022-06-12 DIAGNOSIS — O30041 Twin pregnancy, dichorionic/diamniotic, first trimester: Secondary | ICD-10-CM | POA: Diagnosis not present

## 2022-06-12 DIAGNOSIS — O26891 Other specified pregnancy related conditions, first trimester: Secondary | ICD-10-CM | POA: Diagnosis present

## 2022-06-12 DIAGNOSIS — Z3A01 Less than 8 weeks gestation of pregnancy: Secondary | ICD-10-CM | POA: Diagnosis not present

## 2022-06-12 DIAGNOSIS — O208 Other hemorrhage in early pregnancy: Secondary | ICD-10-CM | POA: Insufficient documentation

## 2022-06-12 DIAGNOSIS — O26851 Spotting complicating pregnancy, first trimester: Secondary | ICD-10-CM

## 2022-06-12 DIAGNOSIS — R1030 Lower abdominal pain, unspecified: Secondary | ICD-10-CM | POA: Diagnosis present

## 2022-06-12 LAB — COMPREHENSIVE METABOLIC PANEL
ALT: 21 U/L (ref 0–44)
AST: 16 U/L (ref 15–41)
Albumin: 3.9 g/dL (ref 3.5–5.0)
Alkaline Phosphatase: 61 U/L (ref 38–126)
Anion gap: 8 (ref 5–15)
BUN: 10 mg/dL (ref 6–20)
CO2: 24 mmol/L (ref 22–32)
Calcium: 9.5 mg/dL (ref 8.9–10.3)
Chloride: 105 mmol/L (ref 98–111)
Creatinine, Ser: 0.66 mg/dL (ref 0.44–1.00)
GFR, Estimated: >60 mL/min (ref >60)
Glucose, Bld: 89 mg/dL (ref 70–99)
Potassium: 3.8 mmol/L (ref 3.5–5.1)
Sodium: 137 mmol/L (ref 135–145)
Total Bilirubin: 0.4 mg/dL (ref 0.3–1.2)
Total Protein: 6.9 g/dL (ref 6.5–8.1)

## 2022-06-12 LAB — WET PREP, GENITAL
Sperm: NONE SEEN
Trich, Wet Prep: NONE SEEN
WBC, Wet Prep HPF POC: >=10 — AB (ref <10)
Yeast Wet Prep HPF POC: NONE SEEN

## 2022-06-12 LAB — URINALYSIS, ROUTINE W REFLEX MICROSCOPIC
Bilirubin Urine: NEGATIVE
Glucose, UA: NEGATIVE mg/dL
Ketones, ur: NEGATIVE mg/dL
Nitrite: NEGATIVE
Protein, ur: NEGATIVE mg/dL
Specific Gravity, Urine: 1.03 (ref 1.005–1.030)
pH: 5 (ref 5.0–8.0)

## 2022-06-12 LAB — CBC
HCT: 36.4 % (ref 36.0–46.0)
Hemoglobin: 12.3 g/dL (ref 12.0–15.0)
MCH: 29.9 pg (ref 26.0–34.0)
MCHC: 33.8 g/dL (ref 30.0–36.0)
MCV: 88.3 fL (ref 80.0–100.0)
Platelets: 308 10*3/uL (ref 150–400)
RBC: 4.12 MIL/uL (ref 3.87–5.11)
RDW: 12.7 % (ref 11.5–15.5)
WBC: 10.4 10*3/uL (ref 4.0–10.5)
nRBC: 0 % (ref 0.0–0.2)

## 2022-06-12 LAB — HEPATITIS C ANTIBODY: HCV Ab: NONREACTIVE

## 2022-06-12 LAB — HIV ANTIBODY (ROUTINE TESTING W REFLEX): HIV Screen 4th Generation wRfx: NONREACTIVE

## 2022-06-12 NOTE — MAU Note (Signed)
.  Nicole Mclean is a 26 y.o. at [redacted]w[redacted]d here in MAU reporting: started having some pink spotting this week. She also has lower abdominal cramping. Denies recent intercourse. States that she recently found out her partner has been with other people and would like to be tested for STDs.   Pain score: 6 Vitals:   06/12/22 1747  BP: 133/82  Pulse: 82  Resp: 14  Temp: 98.6 F (37 C)  SpO2: 100%      Lab orders placed from triage:  UA

## 2022-06-12 NOTE — MAU Provider Note (Signed)
Chief Complaint: Vaginal Bleeding   Event Date/Time   First Provider Initiated Contact with Patient 06/12/22 1822      SUBJECTIVE HPI: Nicole Mclean is a 26 y.o. [id number] at [redacted]w[redacted]d by LMP who presents to maternity admissions reporting spotting with onset last week that stopped then restarted 3-4 days ago. It is pink, seen when wiping, and not requiring a pad. She never had bleeding like this in previous pregnancies She reports recently learning that the father of the baby has other partners and she desires testing for STIs.  She had appropriate rise in hcg from 06/02/22 to 06/04/22 and has outpatient US ordered this week.    HPI  Past Medical History:  Diagnosis Date   Chlamydia 01/2016   Gonorrhea April 2015   Gonorrhea    Infection    UTI   STD (female)    Syphilis    UTI (urinary tract infection)    Past Surgical History:  Procedure Laterality Date   chin surgery     HAND SURGERY     Left  hand   mirena      mirena iud removed   RHYTIDECTOMY NECK / CHEEK / CHIN     Social History   Socioeconomic History   Marital status: Single    Spouse name: Not on file   Number of children: Not on file   Years of education: Not on file   Highest education level: Not on file  Occupational History   Not on file  Tobacco Use   Smoking status: Never    Passive exposure: Never   Smokeless tobacco: Never  Vaping Use   Vaping Use: Never used  Substance and Sexual Activity   Alcohol use: No    Alcohol/week: 0.0 standard drinks of alcohol   Drug use: No   Sexual activity: Yes    Partners: Male    Birth control/protection: None  Other Topics Concern   Not on file  Social History Narrative   Not on file   Social Determinants of Health   Financial Resource Strain: Not on file  Food Insecurity: Not on file  Transportation Needs: Not on file  Physical Activity: Not on file  Stress: Not on file  Social Connections: Not on file  Intimate Partner Violence: Not on file   No  current facility-administered medications on file prior to encounter.   Current Outpatient Medications on File Prior to Encounter  Medication Sig Dispense Refill   Prenatal Vit-Fe Fumarate-FA (PREPLUS) 27-1 MG TABS Take 1 tablet by mouth daily.     No Known Allergies  ROS:  Review of Systems  Constitutional:  Negative for chills, fatigue and fever.  Respiratory:  Negative for shortness of breath.   Cardiovascular:  Negative for chest pain.  Gastrointestinal:  Negative for abdominal pain, nausea and vomiting.  Genitourinary:  Positive for vaginal bleeding. Negative for difficulty urinating, dysuria, flank pain, pelvic pain, vaginal discharge and vaginal pain.  Neurological:  Negative for dizziness and headaches.  Psychiatric/Behavioral: Negative.       I have reviewed patient's Past Medical Hx, Surgical Hx, Family Hx, Social Hx, medications and allergies.   Physical Exam  Patient Vitals for the past 24 hrs:  BP Temp Temp src Pulse Resp SpO2 Weight  06/12/22 1747 133/82 98.6 F (37 C) Oral 82 14 100 % --  06/12/22 1745 -- -- -- -- -- -- 104.9 kg   Constitutional: Well-developed, well-nourished female in no acute distress.  Cardiovascular: normal rate Respiratory:  normal effort GI: Abd soft, non-tender. Pos BS x 4 MS: Extremities nontender, no edema, normal ROM Neurologic: Alert and oriented x 4.  GU: Neg CVAT.  PELVIC EXAM: self swab by patient for GC/wet prep   LAB RESULTS Results for orders placed or performed during the hospital encounter of 06/12/22 (from the past 24 hour(s))  Urinalysis, Routine w reflex microscopic Urine, Clean Catch     Status: Abnormal   Collection Time: 06/12/22  5:59 PM  Result Value Ref Range   Color, Urine YELLOW YELLOW   APPearance HAZY (A) CLEAR   Specific Gravity, Urine 1.030 1.005 - 1.030   pH 5.0 5.0 - 8.0   Glucose, UA NEGATIVE NEGATIVE mg/dL   Hgb urine dipstick SMALL (A) NEGATIVE   Bilirubin Urine NEGATIVE NEGATIVE   Ketones, ur  NEGATIVE NEGATIVE mg/dL   Protein, ur NEGATIVE NEGATIVE mg/dL   Nitrite NEGATIVE NEGATIVE   Leukocytes,Ua SMALL (A) NEGATIVE   RBC / HPF 0-5 0 - 5 RBC/hpf   WBC, UA 6-10 0 - 5 WBC/hpf   Bacteria, UA RARE (A) NONE SEEN   Squamous Epithelial / LPF 0-5 0 - 5   Mucus PRESENT   GC/Chlamydia probe amp ([place])not at [facility]     Status: Abnormal   Collection Time: 06/12/22  6:16 PM  Result Value Ref Range   Neisseria Gonorrhea Positive (A)    Chlamydia Negative    Comment Normal Reference Ranger Chlamydia - Negative    Comment      Normal Reference Range Neisseria Gonorrhea - Negative  Wet prep, genital     Status: Abnormal   Collection Time: 06/12/22  6:26 PM   Specimen: PATH Cytology Cervicovaginal Ancillary Only  Result Value Ref Range   Yeast Wet Prep HPF POC NONE SEEN NONE SEEN   Trich, Wet Prep NONE SEEN NONE SEEN   Clue Cells Wet Prep HPF POC PRESENT (A) NONE SEEN   WBC, Wet Prep HPF POC >=10 (A) <10   Sperm NONE SEEN   RPR     Status: None   Collection Time: 06/12/22  6:54 PM  Result Value Ref Range   RPR Ser Ql NON REACTIVE NON REACTIVE  HIV Antibody (routine testing w rflx)     Status: None   Collection Time: 06/12/22  6:54 PM  Result Value Ref Range   HIV Screen 4th Generation wRfx Non Reactive Non Reactive  Hepatitis C antibody     Status: None   Collection Time: 06/12/22  6:54 PM  Result Value Ref Range   HCV Ab NON REACTIVE NON REACTIVE  CBC     Status: None   Collection Time: 06/12/22  6:54 PM  Result Value Ref Range   WBC 10.4 4.0 - 10.5 K/uL   RBC 4.12 3.87 - 5.11 MIL/uL   Hemoglobin 12.3 12.0 - 15.0 g/dL   HCT 83.1 51.7 - 61.6 %   MCV 88.3 80.0 - 100.0 fL   MCH 29.9 26.0 - 34.0 pg   MCHC 33.8 30.0 - 36.0 g/dL   RDW 07.3 71.0 - 62.6 %   Platelets 308 150 - 400 K/uL   nRBC 0.0 0.0 - 0.2 %  Comprehensive metabolic panel     Status: None   Collection Time: 06/12/22  6:54 PM  Result Value Ref Range   Sodium 137 135 - 145 mmol/L   Potassium 3.8 3.5 - 5.1  mmol/L   Chloride 105 98 - 111 mmol/L   CO2 24 22 - 32  mmol/L   Glucose, Bld 89 70 - 99 mg/dL   BUN 10 6 - 20 mg/dL   Creatinine, Ser 7.12 0.44 - 1.00 mg/dL   Calcium 9.5 8.9 - 45.8 mg/dL   Total Protein 6.9 6.5 - 8.1 g/dL   Albumin 3.9 3.5 - 5.0 g/dL   AST 16 15 - 41 U/L   ALT 21 0 - 44 U/L   Alkaline Phosphatase 61 38 - 126 U/L   Total Bilirubin 0.4 0.3 - 1.2 mg/dL   GFR, Estimated >09 >98 mL/min   Anion gap 8 5 - 15    --/--/O POS (11/16 1040)  IMAGING US OB Transvaginal  Result Date: 06/12/2022 CLINICAL DATA:  Vaginal spotting and pelvic cramping. EXAM: US OB TRANSVAGINAL TECHNIQUE: Transvaginal ultrasound was performed for complete evaluation of the gestation as well as the maternal uterus, adnexal regions, and pelvic cul-de-sac. COMPARISON:  June 02, 2022 FINDINGS: Number of IUPs:  2 Chorionicity/Amnionicity:  Dichorionic-diamniotic (thick membrane) TWIN 1 Yolk sac:  Not Visualized. Embryo:  Not Visualized. Cardiac Activity: Not Visualized. Heart Rate: N/A bpm MSD: 5.8 mm   5 w   2 d    [place] EDC: February 10, 2023 TWIN 2 Yolk sac:  Not Visualized. Embryo:  Not Visualized. Cardiac Activity: Not Visualized. Heart Rate: N/A bpm MSD: 3.5 mm   5 w 0 d    [place] EDC: February 12, 2023 Subchorionic hemorrhage:  Small Maternal uterus/adnexae: The right ovary measures 4.5 cm x 2.2 cm x 2.8 cm and contains 2 small corpus luteum cysts. The left ovary measures 2.7 cm x 2.3 cm x 2.3 cm and is normal in appearance. No pelvic free fluid is seen. IMPRESSION: Probable early dichorionic/diamniotic intrauterine gestational sacs, but no yolk sac, fetal pole, or cardiac activity yet visualized. Recommend follow-up quantitative B-HCG levels and follow-up US in 14 days to assess viability. This recommendation follows SRU consensus guidelines: Diagnostic Criteria for Nonviable Pregnancy Early in the First Trimester. Malva Limes Med 2013; 338:2505-39. Electronically Signed   By: Aram Candela M.D.   On: 06/12/2022 19:54    US OB LESS THAN 14 WEEKS WITH OB TRANSVAGINAL  Result Date: 06/02/2022 CLINICAL DATA:  Abdominal pain, lower abdominal cramping for 3-4 days pregnant, LMP 05/06/2022; quantitative beta hCG not yet available for correlation EXAM: OBSTETRIC <14 WK [place] AND TRANSVAGINAL OB US TECHNIQUE: Both transabdominal and transvaginal ultrasound examinations were performed for complete evaluation of the gestation as well as the maternal uterus, adnexal regions, and pelvic cul-de-sac. Transvaginal technique was performed to assess early pregnancy. COMPARISON:  None Available. FINDINGS: Intrauterine gestational sac: Absent Yolk sac:  N/A Embryo:  N/A Cardiac Activity: N/A Heart Rate: N/A  bpm MSD:   mm    w     d CRL:    mm    w    d                  [place] EDC: Subchorionic hemorrhage:  N/A Maternal uterus/adnexae: Uterus anteverted without focal mass. Trace fluid at upper uterine segment endometrial canal. No definite uterine mass or gestational sac. RIGHT ovary measures 4.5 x 2.7 x 2.0 cm and contains a corpus luteum. LEFT ovary normal size and morphology, 2.7 x 2.0 x 2.4 cm. Trace nonspecific free pelvic fluid. No adnexal masses. IMPRESSION: Trace nonspecific fluid within endometrial canal. No intrauterine gestation identified. Findings are consistent with pregnancy of unknown location. Differential diagnosis includes early intrauterine pregnancy too early to visualize, spontaneous abortion, and ectopic  pregnancy. Serial quantitative beta HCG and or follow-up ultrasound recommended to definitively exclude ectopic pregnancy. Electronically Signed   By: Ulyses Southward M.D.   On: 06/02/2022 11:13    MAU Management/MDM: Orders Placed This Encounter  Procedures   Wet prep, genital   US OB Transvaginal   US OB Transvaginal   Urinalysis, Routine w reflex microscopic Urine, Clean Catch   RPR   HIV Antibody (routine testing w rflx)   Hepatitis C antibody   CBC   Comprehensive metabolic panel   Discharge patient    No orders  of the defined types were placed in this encounter.   Reviewed [place] with di/di gestational sacs noted.  Pt to f/u with viability [place] in 10 days, which was ordered.  Reviewed precautions/reasons to return to MAU.    ASSESSMENT 1. Dichorionic diamniotic twin pregnancy in first trimester   2. Spotting affecting pregnancy in first trimester   3. [redacted] weeks gestation of pregnancy     PLAN Discharge home Allergies as of 06/12/2022   No Known Allergies      Medication List     TAKE these medications    PrePLUS 27-1 MG Tabs Take 1 tablet by mouth daily.        Follow-up Information     [facility] Follow up.   Specialty: Radiology Why: The clinic will call you with appointment. Contact information: [address] [place] [address] [phone]        [facility] Follow up.   Specialty: Obstetrics and Gynecology Why: As needed for emergencies Contact information: [address] [id number] Wilhemina Bonito [place] [address] [phone]                Sharen Counter Certified Nurse-Midwife 06/13/2022  5:06 PM

## 2022-06-13 LAB — GC/CHLAMYDIA PROBE AMP (~~LOC~~) NOT AT ARMC
Chlamydia: NEGATIVE
Comment: NEGATIVE
Comment: NORMAL
Neisseria Gonorrhea: POSITIVE — AB

## 2022-06-13 LAB — RPR: RPR Ser Ql: NONREACTIVE

## 2022-06-14 ENCOUNTER — Inpatient Hospital Stay (HOSPITAL_COMMUNITY)
Admission: AD | Admit: 2022-06-14 | Discharge: 2022-06-14 | Disposition: A | Discharge status: Home / Self Care | Payer: No Typology Code available for payment source | Attending: Obstetrics and Gynecology | Admitting: Obstetrics and Gynecology

## 2022-06-14 ENCOUNTER — Other Ambulatory Visit: Payer: Self-pay

## 2022-06-14 DIAGNOSIS — O98219 Gonorrhea complicating pregnancy, unspecified trimester: Secondary | ICD-10-CM | POA: Diagnosis present

## 2022-06-14 DIAGNOSIS — Z3A01 Less than 8 weeks gestation of pregnancy: Secondary | ICD-10-CM | POA: Diagnosis not present

## 2022-06-14 DIAGNOSIS — O98211 Gonorrhea complicating pregnancy, first trimester: Secondary | ICD-10-CM | POA: Diagnosis present

## 2022-06-14 DIAGNOSIS — O98213 Gonorrhea complicating pregnancy, third trimester: Secondary | ICD-10-CM | POA: Diagnosis present

## 2022-06-14 MED ORDER — CEFTRIAXONE SODIUM 500 MG IJ SOLR
500.0000 mg | Freq: Once | INTRAMUSCULAR | Status: AC
  Administered 2022-06-14: 500 mg via INTRAMUSCULAR
  Filled 2022-06-14: qty 500

## 2022-06-14 NOTE — MAU Provider Note (Signed)
Event Date/Time   First Provider Initiated Contact with Patient 06/14/22 1046      [name] is a 26 y.o. [id number] patient who presents to MAU today for treatment of gonorrhea. She reports she saw the positive result in her MyChart. She reports she is still seeing some spotting but denies any pain.   O BP 115/76 (BP Location: Right Arm)   Pulse 96   Temp 98.8 F (37.1 C) (Oral)   Resp 16   Ht 5' (1.524 m)   Wt 105.5 kg   LMP 05/06/2022   SpO2 98% Comment: room air  BMI 45.41 kg/m  Physical Exam Vitals and nursing note reviewed.  Constitutional:      General: She is not in acute distress.    Appearance: She is well-developed.  HENT:     Head: Normocephalic.  Eyes:     Pupils: Pupils are equal, round, and reactive to light.  Cardiovascular:     Rate and Rhythm: Normal rate and regular rhythm.     Heart sounds: Normal heart sounds.  Pulmonary:     Effort: Pulmonary effort is normal. No respiratory distress.     Breath sounds: Normal breath sounds.  Abdominal:     General: Bowel sounds are normal. There is no distension.     Palpations: Abdomen is soft.     Tenderness: There is no abdominal tenderness.  Skin:    General: Skin is warm and dry.  Neurological:     Mental Status: She is alert and oriented to person, place, and time.  Psychiatric:        Mood and Affect: Mood normal.        Behavior: Behavior normal.        Thought Content: Thought content normal.        Judgment: Judgment normal.    A Medical screening exam complete 1. Gonorrhea affecting pregnancy in first trimester   2. [redacted] weeks gestation of pregnancy    Treatment given in MAU. STD precautions reviewed  P -Discharge home in stable condition -First trimester precautions discussed -Patient advised to follow-up with OB as scheduled for prenatal care -Patient may return to MAU as needed or if her condition were to change or worsen   Rolm Bookbinder, [place] 06/14/2022 10:46 AM

## 2022-06-14 NOTE — Discharge Instructions (Signed)
Prenatal Care Providers           [facility]  [address] [phone]  [facility]   [address]  [phone]  [facility] [place]       [address] [phone]            [facility] [place]     [address] [phone]          [facility] [place]   [address] [phone]  [facility]  [address] [phone]     [facility]  [address]   [phone]     [facility]  Phone: [phone]  [facility]  Phone: [phone]  [facility] Phone: [phone]  [facility] Phone: [phone]  [facility] Phone: [phone]  [facility] Phone: [phone]  [facility]  Phone: [phone] Safe Medications in Pregnancy   Acne: Benzoyl Peroxide Salicylic Acid  Backache/Headache: Tylenol: 2 regular strength every 4 hours OR              2 Extra strength every 6 hours  Colds/Coughs/Allergies: Benadryl (alcohol free) 25 mg every 6 hours as needed Breath right strips Claritin Cepacol throat lozenges Chloraseptic throat spray Cold-Eeze- up to three times per day Cough drops, alcohol free Flonase (by prescription only) Guaifenesin Mucinex Robitussin DM (plain only, alcohol free) Saline nasal spray/drops Sudafed (pseudoephedrine) & Actifed ** use only after [redacted] weeks gestation and if you do not have high blood pressure Tylenol Vicks Vaporub Zinc lozenges Zyrtec   Constipation: Colace Ducolax suppositories Fleet enema Glycerin suppositories Metamucil Milk of magnesia Miralax Senokot Smooth move tea  Diarrhea: Kaopectate Imodium A-D  *NO pepto  Bismol  Hemorrhoids: Anusol Anusol HC Preparation H Tucks  Indigestion: Tums Maalox Mylanta Zantac  Pepcid  Insomnia: Benadryl (alcohol free) 25mg every 6 hours as needed Tylenol PM Unisom, no Gelcaps  Leg Cramps: Tums MagGel  Nausea/Vomiting:  Bonine Dramamine Emetrol Ginger extract Sea bands Meclizine  Nausea medication to take during pregnancy:  Unisom (doxylamine succinate 25 mg tablets) Take one tablet daily at bedtime. If symptoms are not adequately controlled, the dose can be increased to a maximum recommended dose of two tablets daily (1/2 tablet in the morning, 1/2 tablet mid-afternoon and one at bedtime). Vitamin B6 100mg tablets. Take one tablet twice a day (up to 200 mg per day).  Skin Rashes: Aveeno products Benadryl cream or 25mg every 6 hours as needed Calamine Lotion 1% cortisone cream  Yeast infection: Gyne-lotrimin 7 Monistat 7   **If taking multiple medications, please check labels to avoid duplicating the same active ingredients **take medication as directed on the label ** Do not exceed 4000 mg of tylenol in 24 hours **Do not take medications that contain aspirin or ibuprofen    

## 2022-06-14 NOTE — MAU Note (Signed)
Nicole Mclean is a 26 y.o. at [redacted]w[redacted]d here in MAU reporting: saw + gonorrhea result. Would like to be treated. Still having cramping and bleeding since previous MAU visit, states it is the same.   Onset of complaint: today  Pain score: 4/10  Vitals:   06/14/22 0855  BP: 115/76  Pulse: 96  Resp: 16  Temp: 98.8 F (37.1 C)  SpO2: 98%     FHT:NA  Lab orders placed from triage: none

## 2022-06-15 ENCOUNTER — Telehealth: Payer: Self-pay | Admitting: *Deleted

## 2022-06-15 ENCOUNTER — Other Ambulatory Visit: Payer: No Typology Code available for payment source

## 2022-06-15 NOTE — Telephone Encounter (Signed)
Rec'd FMLA forms for this pt at [redacted]w[redacted]d [place]. Pt requesting Intermittent leave for MAU visits, prenatal appts, and ultrasounds. Pt has not been seen in the office for pregnancy yet. Advised that we are unable to complete forms until she sees a provider in the office, but that forms will retroactively cover absences. Pt verbalized understanding. Call transferred to schedulers to make New OB appt and Intake appt. Pt to have [place] 06/22/22 to confirm viability and dating. If results show SAB or missed AB then appt will be changed or cancelled. Pt verbalized understanding.

## 2022-06-22 ENCOUNTER — Ambulatory Visit
Admission: RE | Admit: 2022-06-22 | Discharge: 2022-06-22 | Disposition: A | Discharge status: Home / Self Care | Payer: No Typology Code available for payment source | Source: Ambulatory Visit | Attending: Advanced Practice Midwife | Admitting: Advanced Practice Midwife

## 2022-06-22 ENCOUNTER — Other Ambulatory Visit (HOSPITAL_COMMUNITY): Payer: Self-pay | Admitting: Advanced Practice Midwife

## 2022-06-22 ENCOUNTER — Ambulatory Visit (INDEPENDENT_AMBULATORY_CARE_PROVIDER_SITE_OTHER): Payer: No Typology Code available for payment source | Admitting: General Practice

## 2022-06-22 ENCOUNTER — Other Ambulatory Visit: Payer: No Typology Code available for payment source

## 2022-06-22 DIAGNOSIS — O26851 Spotting complicating pregnancy, first trimester: Secondary | ICD-10-CM | POA: Diagnosis present

## 2022-06-22 DIAGNOSIS — O30041 Twin pregnancy, dichorionic/diamniotic, first trimester: Secondary | ICD-10-CM | POA: Diagnosis present

## 2022-06-22 DIAGNOSIS — Z3A01 Less than 8 weeks gestation of pregnancy: Secondary | ICD-10-CM | POA: Insufficient documentation

## 2022-06-22 DIAGNOSIS — Z712 Person consulting for explanation of examination or test findings: Secondary | ICD-10-CM

## 2022-06-22 DIAGNOSIS — O3680X2 Pregnancy with inconclusive fetal viability, fetus 2: Secondary | ICD-10-CM

## 2022-06-22 NOTE — Progress Notes (Signed)
Reviewed ultrasound results with Dr Crissie Reese who finds twin A with yolk, embryo & heart rate. Twin B only shows gestational sac. Patient should have follow up ultrasound in 10-14 days for continued evaluation.   Called patient and informed her of results. Scheduled follow up ultrasound for 12/19. Patient verbalized understanding.   Chase Caller RN BSN 06/22/22

## 2022-06-29 ENCOUNTER — Inpatient Hospital Stay (HOSPITAL_COMMUNITY): Payer: No Typology Code available for payment source

## 2022-06-29 ENCOUNTER — Inpatient Hospital Stay (HOSPITAL_COMMUNITY)
Admission: AD | Admit: 2022-06-29 | Discharge: 2022-06-29 | Disposition: A | Discharge status: Home / Self Care | Payer: No Typology Code available for payment source | Attending: Obstetrics and Gynecology | Admitting: Obstetrics and Gynecology

## 2022-06-29 ENCOUNTER — Other Ambulatory Visit: Payer: Self-pay

## 2022-06-29 DIAGNOSIS — O039 Complete or unspecified spontaneous abortion without complication: Secondary | ICD-10-CM | POA: Diagnosis not present

## 2022-06-29 DIAGNOSIS — O3121X1 Continuing pregnancy after intrauterine death of one fetus or more, first trimester, fetus 1: Secondary | ICD-10-CM

## 2022-06-29 DIAGNOSIS — Z3A00 Weeks of gestation of pregnancy not specified: Secondary | ICD-10-CM

## 2022-06-29 DIAGNOSIS — O3680X0 Pregnancy with inconclusive fetal viability, not applicable or unspecified: Secondary | ICD-10-CM

## 2022-06-29 DIAGNOSIS — Z3A01 Less than 8 weeks gestation of pregnancy: Secondary | ICD-10-CM | POA: Insufficient documentation

## 2022-06-29 LAB — URINALYSIS, ROUTINE W REFLEX MICROSCOPIC
Bacteria, UA: NONE SEEN
Bilirubin Urine: NEGATIVE
Glucose, UA: NEGATIVE mg/dL
Ketones, ur: NEGATIVE mg/dL
Leukocytes,Ua: NEGATIVE
Nitrite: NEGATIVE
Protein, ur: NEGATIVE mg/dL
Specific Gravity, Urine: 1.021 (ref 1.005–1.030)
pH: 5 (ref 5.0–8.0)

## 2022-06-29 LAB — CBC
HCT: 33.9 % — ABNORMAL LOW (ref 36.0–46.0)
Hemoglobin: 11.5 g/dL — ABNORMAL LOW (ref 12.0–15.0)
MCH: 29.6 pg (ref 26.0–34.0)
MCHC: 33.9 g/dL (ref 30.0–36.0)
MCV: 87.4 fL (ref 80.0–100.0)
Platelets: 313 10*3/uL (ref 150–400)
RBC: 3.88 MIL/uL (ref 3.87–5.11)
RDW: 12.6 % (ref 11.5–15.5)
WBC: 10.9 10*3/uL — ABNORMAL HIGH (ref 4.0–10.5)
nRBC: 0 % (ref 0.0–0.2)

## 2022-06-29 LAB — HCG, QUANTITATIVE, PREGNANCY: hCG, Beta Chain, Quant, S: 56809 m[IU]/mL — ABNORMAL HIGH (ref <5)

## 2022-06-29 MED ORDER — ACETAMINOPHEN 500 MG PO TABS
1000.0000 mg | ORAL_TABLET | Freq: Once | ORAL | Status: AC
  Administered 2022-06-29: 1000 mg via ORAL
  Filled 2022-06-29: qty 2

## 2022-06-29 NOTE — MAU Note (Signed)
Nicole Mclean is a 26 y.o. at [redacted]w[redacted]d here in MAU reporting: today when she was at lunch she had a gush of blood. Has been cramping for the past 2 days and last night started having bad back pain.   Onset of complaint: today  Pain score: 6/10  Vitals:   06/29/22 1430  BP: 116/61  Pulse: 85  Resp: 16  Temp: 98.1 F (36.7 C)  SpO2: 99%     FHT:NA  Lab orders placed from triage: UA

## 2022-06-29 NOTE — MAU Provider Note (Signed)
History     CSN: [id number]  Arrival date and time: 06/29/22 1413   Event Date/Time   First Provider Initiated Contact with Patient 06/29/22 1534      Chief Complaint  Patient presents with   Abdominal Pain   Vaginal Bleeding   HPI  Nicole Mclean is a 26 y.o. [id number] at [redacted]w[redacted]d who presents to MAU with chief complaint abdominal pain and vaginal bleeding. These are new problems, onset today. She reports feeling a "gush of blood". Her abdominal pain is suprapubic and LLQ and radiates to her low back. Pain score is 6/10. She denies aggravating or alleviating factors.   Patient is pregnant with twin gestation. She learned that 1 twin was without cardiac activity on 06/22/2022 via RN phone call.   OB History     Gravida  4   Para  2   Term  2   Preterm  0   AB  1   Living  2      SAB  1   IAB  0   Ectopic  0   Multiple  0   Live Births  2           Past Medical History:  Diagnosis Date   Chlamydia 01/2016   Gonorrhea April 2015   Gonorrhea    Infection    UTI   STD (female)    Syphilis    UTI (urinary tract infection)     Past Surgical History:  Procedure Laterality Date   chin surgery     HAND SURGERY     Left  hand   mirena      mirena iud removed   RHYTIDECTOMY NECK / CHEEK / CHIN      Family History  Adopted: Yes  Problem Relation Age of Onset   Healthy Mother    Healthy Father    Learning disabilities Neg Hx    Mental illness Neg Hx    Mental retardation Neg Hx    Vision loss Neg Hx    Varicose Veins Neg Hx     Social History   Tobacco Use   Smoking status: Never    Passive exposure: Never   Smokeless tobacco: Never  Vaping Use   Vaping Use: Never used  Substance Use Topics   Alcohol use: No    Alcohol/week: 0.0 standard drinks of alcohol   Drug use: No    Allergies: No Known Allergies  Medications Prior to Admission  Medication Sig Dispense Refill Last Dose   Prenatal Vit-Fe Fumarate-FA (PREPLUS) 27-1 MG TABS Take  1 tablet by mouth daily.       Review of Systems  Gastrointestinal:  Positive for abdominal pain.  Genitourinary:  Positive for vaginal bleeding.  All other systems reviewed and are negative.  Physical Exam   Blood pressure 116/61, pulse 85, temperature 98.1 F (36.7 C), temperature source Oral, resp. rate 16, height 5' (1.524 m), weight 106.6 kg, last menstrual period 05/06/2022, SpO2 99 %.  Physical Exam Vitals and nursing note reviewed. Exam conducted with a chaperone present.  Constitutional:      Appearance: She is not ill-appearing.  Cardiovascular:     Rate and Rhythm: Normal rate.  Pulmonary:     Effort: Pulmonary effort is normal.  Abdominal:     General: Abdomen is flat.     Palpations: Abdomen is soft.     Tenderness: There is generalized abdominal tenderness.  Skin:    Capillary Refill: Capillary refill takes  less than 2 seconds.  Neurological:     Mental Status: She is oriented to person, place, and time.    MAU Course  Procedures  MDM Patient Vitals for the past 24 hrs:  BP Temp Temp src Pulse Resp SpO2 Height Weight  06/29/22 1430 116/61 98.1 F (36.7 C) Oral 85 16 99 % -- --  06/29/22 1427 -- -- -- -- -- -- 5' (1.524 m) 106.6 kg   Results for orders placed or performed during the hospital encounter of 06/29/22 (from the past 24 hour(s))  Urinalysis, Routine w reflex microscopic Urine, Clean Catch     Status: Abnormal   Collection Time: 06/29/22  2:38 PM  Result Value Ref Range   Color, Urine YELLOW YELLOW   APPearance CLEAR CLEAR   Specific Gravity, Urine 1.021 1.005 - 1.030   pH 5.0 5.0 - 8.0   Glucose, UA NEGATIVE NEGATIVE mg/dL   Hgb urine dipstick SMALL (A) NEGATIVE   Bilirubin Urine NEGATIVE NEGATIVE   Ketones, ur NEGATIVE NEGATIVE mg/dL   Protein, ur NEGATIVE NEGATIVE mg/dL   Nitrite NEGATIVE NEGATIVE   Leukocytes,Ua NEGATIVE NEGATIVE   RBC / HPF 0-5 0 - 5 RBC/hpf   WBC, UA 0-5 0 - 5 WBC/hpf   Bacteria, UA NONE SEEN NONE SEEN   Squamous  Epithelial / LPF 0-5 0 - 5   Mucus PRESENT   CBC     Status: Abnormal   Collection Time: 06/29/22  2:46 PM  Result Value Ref Range   WBC 10.9 (H) 4.0 - 10.5 K/uL   RBC 3.88 3.87 - 5.11 MIL/uL   Hemoglobin 11.5 (L) 12.0 - 15.0 g/dL   HCT 33.9 (L) 36.0 - 46.0 %   MCV 87.4 80.0 - 100.0 fL   MCH 29.6 26.0 - 34.0 pg   MCHC 33.9 30.0 - 36.0 g/dL   RDW 12.6 11.5 - 15.5 %   Platelets 313 150 - 400 K/uL   nRBC 0.0 0.0 - 0.2 %  hCG, quantitative, pregnancy     Status: Abnormal   Collection Time: 06/29/22  2:46 PM  Result Value Ref Range   hCG, Beta Chain, Quant, S 56,809 (H) <5 mIU/mL   US OB Transvaginal  Result Date: 06/29/2022 CLINICAL DATA:  Vaginal bleeding. EXAM: TRANSVAGINAL OB ULTRASOUND TECHNIQUE: Transvaginal ultrasound was performed for complete evaluation of the gestation as well as the maternal uterus, adnexal regions, and pelvic cul-de-sac. COMPARISON:  June 22, 2022. FINDINGS: Intrauterine gestational sac: Single, may be in lower uterine segment. Yolk sac:  Visualized. Embryo:  Visualized. Cardiac Activity: Visualized. Heart Rate: 144 bpm CRL:   10.7 mm   7 w 1 d                  [place] EDC: February 14, 2023. Subchorionic hemorrhage:  Small subchorionic hemorrhage is noted. Maternal uterus/adnexae: Right ovary is unremarkable. Left ovary is not visualized. No free fluid is noted. IMPRESSION: Single live intrauterine gestation of 7 weeks 1 day which may be in lower uterine segment. Small subchorionic hemorrhage is noted. The second gestational sac noted on prior exam is no longer visualized. Electronically Signed   By: Marijo Conception M.D.   On: 06/29/2022 15:31    Assessment and Plan  --26 y.o. [id number]  --Miscarriage of one twin, now with SIUP --Concern for location in lower uterine segment --Miscarriage/bleeding precautions reviewed in depth --Discharge home in stable condition with strict return precautions  Darlina Rumpf, [place], MSN, CNM 06/29/2022, 4:58 PM

## 2022-07-05 ENCOUNTER — Ambulatory Visit (INDEPENDENT_AMBULATORY_CARE_PROVIDER_SITE_OTHER): Payer: No Typology Code available for payment source

## 2022-07-05 ENCOUNTER — Ambulatory Visit (INDEPENDENT_AMBULATORY_CARE_PROVIDER_SITE_OTHER): Payer: No Typology Code available for payment source | Admitting: Advanced Practice Midwife

## 2022-07-05 ENCOUNTER — Encounter: Payer: Self-pay | Admitting: Family Medicine

## 2022-07-05 DIAGNOSIS — O3680X2 Pregnancy with inconclusive fetal viability, fetus 2: Secondary | ICD-10-CM | POA: Diagnosis not present

## 2022-07-05 DIAGNOSIS — Z3491 Encounter for supervision of normal pregnancy, unspecified, first trimester: Secondary | ICD-10-CM | POA: Diagnosis not present

## 2022-07-05 DIAGNOSIS — Z3A08 8 weeks gestation of pregnancy: Secondary | ICD-10-CM

## 2022-07-05 DIAGNOSIS — O468X1 Other antepartum hemorrhage, first trimester: Secondary | ICD-10-CM

## 2022-07-05 DIAGNOSIS — O418X10 Other specified disorders of amniotic fluid and membranes, first trimester, not applicable or unspecified: Secondary | ICD-10-CM | POA: Diagnosis not present

## 2022-07-05 DIAGNOSIS — Z3480 Encounter for supervision of other normal pregnancy, unspecified trimester: Secondary | ICD-10-CM

## 2022-07-05 DIAGNOSIS — O0993 Supervision of high risk pregnancy, unspecified, third trimester: Secondary | ICD-10-CM | POA: Insufficient documentation

## 2022-07-05 DIAGNOSIS — Z3401 Encounter for supervision of normal first pregnancy, first trimester: Secondary | ICD-10-CM

## 2022-07-05 MED ORDER — CONCEPT OB 130-92.4-1 MG PO CAPS: 1.0000 | ORAL_CAPSULE | Freq: Every day | ORAL | 12 refills | Status: DC

## 2022-07-05 NOTE — Patient Instructions (Signed)
  CenteringPregnancy is a model of prenatal care that started 30 years ago and is used in about 600 practices around the [place]. You meet with a group of 8-12 women due around the same time as you. In Centering you will have individual time with the provider and meet as a group. There's much more time for discussion and learning. You will actually have much more time with your provider in Centering than in traditional prenatal care.? You will come directly into the Centering room and will not wait in the lobby so there is no wasted time. You will have 2-hour visits every 4 weeks then every 2 weeks. You will know your Centering prenatal appointments in advance. In your last month of pregnancy, you may also come in for some individual visits. Additional appointments can be scheduled if you need more care. Studies have shown that CenteringPregnancy improves birth outcomes. We have seen especially big improvements in fewer Black women delivering babies who are too small or born too early. Visit the website [facility] for more information. Let your provider or clinic staff know if you want to sign up.    

## 2022-07-05 NOTE — Progress Notes (Signed)
Ultrasounds Results Note  SUBJECTIVE HPI:  [name] is a 26 y.o. [id number] at [redacted]w[redacted]d by 5.6 week [place] who presents to [facility] for followup ultrasound results. The patient reports ongoing cramping 7/10 at worst. Denies further vaginal bleeding.  Upon review of the patient's records, patient was seen in MAU for vaginal bleeding.      Previous HCG's  Latest Reference Range & Units 06/02/22 10:41 06/04/22 11:08 06/29/22 14:46  HCG, Beta Chain, Quant, S <5 mIU/mL 60 (H) 150 (H) 56,809 (H)  (H): Data is abnormally high  Previous US IMPRESSION: Single live intrauterine gestation of 7 weeks 1 day which may be in lower uterine segment. Small subchorionic hemorrhage is noted. The second gestational sac noted on prior exam is no longer visualized.  --/--/O POS (11/16 1040)  Repeat ultrasound was performed earlier today.   Past Medical History:  Diagnosis Date   Chlamydia 01/2016   Gonorrhea April 2015   Gonorrhea    Infection    UTI   STD (female)    Syphilis    UTI (urinary tract infection)    Past Surgical History:  Procedure Laterality Date   chin surgery     HAND SURGERY     Left  hand   mirena      mirena iud removed   RHYTIDECTOMY NECK / CHEEK / CHIN     Social History   Socioeconomic History   Marital status: Single    Spouse name: Not on file   Number of children: Not on file   Years of education: Not on file   Highest education level: Not on file  Occupational History   Not on file  Tobacco Use   Smoking status: Never    Passive exposure: Never   Smokeless tobacco: Never  Vaping Use   Vaping Use: Never used  Substance and Sexual Activity   Alcohol use: No    Alcohol/week: 0.0 standard drinks of alcohol   Drug use: No   Sexual activity: Yes    Partners: Male    Birth control/protection: None  Other Topics Concern   Not on file  Social History Narrative   Not on file   Social Determinants of Health   Financial Resource Strain: Not  on file  Food Insecurity: Not on file  Transportation Needs: Not on file  Physical Activity: Not on file  Stress: Not on file  Social Connections: Not on file  Intimate Partner Violence: Not on file   Current Outpatient Medications on File Prior to Visit  Medication Sig Dispense Refill   Prenatal Vit-Fe Fumarate-FA (PREPLUS) 27-1 MG TABS Take 1 tablet by mouth daily.     No current facility-administered medications on file prior to visit.   No Known Allergies  I have reviewed patient's Past Medical Hx, Surgical Hx, Family Hx, Social Hx, medications and allergies.   Review of Systems Review of Systems  Constitutional: Negative for fever and chills.  Gastrointestinal: Negative for abdominal pain.  Genitourinary: Negative for vaginal bleeding.  Musculoskeletal: Negative for back pain.  Neurological: Negative for dizziness and weakness.    Physical Exam  LMP 05/06/2022   Patient's last menstrual period was 05/06/2022. GENERAL: Well-developed, well-nourished female in no acute distress.  HEENT: Normocephalic, atraumatic.   LUNGS: Effort normal ABDOMEN: Deferred HEART: Regular rate  SKIN: Warm, dry and without erythema PSYCH: Normal mood and affect NEURO: Alert and oriented x 4  LAB RESULTS No results found for this or any  previous visit (from the past 24 hour(s)).  IMAGING Live SIUP measuring 8.4 weeks. FHR 166  ASSESSMENT 1. [redacted] weeks gestation of pregnancy   2. Normal IUP (intrauterine pregnancy) on prenatal ultrasound, first trimester     PLAN Start prenatal care. Pt has gone to [facility] previously and plans to go there for care. Will use dating by 5.6 week [place] since it was > 5 days different that dating by LMP. Discussed dating decision with pt.  Patient advised to start/continue taking prenatal vitamins Go to MAU as needed for heavy bleeding, abdominal pain or fever greater than 100.4.  [place], CNM 07/05/2022 11:03 AM

## 2022-07-15 ENCOUNTER — Ambulatory Visit (INDEPENDENT_AMBULATORY_CARE_PROVIDER_SITE_OTHER): Payer: No Typology Code available for payment source

## 2022-07-15 DIAGNOSIS — Z3480 Encounter for supervision of other normal pregnancy, unspecified trimester: Secondary | ICD-10-CM

## 2022-07-15 MED ORDER — BLOOD PRESSURE KIT DEVI: 1.0000 | 0 refills | Status: DC
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
## 2022-07-22 ENCOUNTER — Other Ambulatory Visit (HOSPITAL_COMMUNITY)
Admission: RE | Admit: 2022-07-22 | Discharge: 2022-07-22 | Disposition: A | Discharge status: Home / Self Care | Payer: No Typology Code available for payment source | Source: Ambulatory Visit | Attending: Obstetrics | Admitting: Obstetrics

## 2022-07-22 ENCOUNTER — Ambulatory Visit (INDEPENDENT_AMBULATORY_CARE_PROVIDER_SITE_OTHER): Payer: No Typology Code available for payment source | Admitting: Obstetrics

## 2022-07-22 ENCOUNTER — Encounter: Payer: Self-pay | Admitting: Obstetrics

## 2022-07-22 VITALS — BP 122/79 | HR 84 | Wt 238.0 lb

## 2022-07-22 DIAGNOSIS — O23599 Infection of other part of genital tract in pregnancy, unspecified trimester: Secondary | ICD-10-CM | POA: Insufficient documentation

## 2022-07-22 DIAGNOSIS — Z3481 Encounter for supervision of other normal pregnancy, first trimester: Secondary | ICD-10-CM | POA: Diagnosis not present

## 2022-07-22 DIAGNOSIS — Z3A00 Weeks of gestation of pregnancy not specified: Secondary | ICD-10-CM | POA: Diagnosis not present

## 2022-07-22 DIAGNOSIS — Z3480 Encounter for supervision of other normal pregnancy, unspecified trimester: Secondary | ICD-10-CM | POA: Insufficient documentation

## 2022-07-22 DIAGNOSIS — Z3A10 10 weeks gestation of pregnancy: Secondary | ICD-10-CM | POA: Diagnosis not present

## 2022-07-22 MED ORDER — CONCEPT OB 130-92.4-1 MG PO CAPS: 1.0000 | ORAL_CAPSULE | Freq: Every day | ORAL | 11 refills | Status: DC

## 2022-07-22 MED ORDER — CONCEPT OB 130-92.4-1 MG PO CAPS: 1.0000 | ORAL_CAPSULE | Freq: Every day | ORAL | 4 refills | Status: DC

## 2022-07-22 NOTE — Progress Notes (Signed)
Pt reports spotting 3 days ago that resolved in a few hours.

## 2022-07-22 NOTE — Progress Notes (Signed)
Subjective:    Nicole Mclean is being seen today for her first obstetrical visit.  This is a planned pregnancy. She is at [redacted]w[redacted]d gestation. Her obstetrical history is significant for  none . Relationship with FOB: significant other, living together. Patient does intend to breast feed. Pregnancy history fully reviewed.  The information documented in the HPI was reviewed and verified.  Menstrual History: OB History     Gravida  4   Para  2   Term  2   Preterm  0   AB  1   Living  2      SAB  1   IAB  0   Ectopic  0   Multiple  0   Live Births  2            Patient's last menstrual period was 05/06/2022 (exact date).    Past Medical History:  Diagnosis Date   Chlamydia 01/2016   Gonorrhea April 2015   Gonorrhea    Infection    UTI   STD (female)    Syphilis    UTI (urinary tract infection)     Past Surgical History:  Procedure Laterality Date   chin surgery     HAND SURGERY     Left  hand   mirena      mirena iud removed   RHYTIDECTOMY NECK / CHEEK / CHIN      (Not in a hospital admission)  No Known Allergies  Social History   Tobacco Use   Smoking status: Never    Passive exposure: Never   Smokeless tobacco: Never  Substance Use Topics   Alcohol use: No    Alcohol/week: 0.0 standard drinks of alcohol    Family History  Adopted: Yes  Problem Relation Age of Onset   Lung disease Mother    Depression Mother    Heart disease Father    Diabetes Father    Hypertension Father    Learning disabilities Neg Hx    Mental illness Neg Hx    Mental retardation Neg Hx    Vision loss Neg Hx    Varicose Veins Neg Hx      Review of Systems Constitutional: negative for weight loss Gastrointestinal: negative for vomiting Genitourinary:negative for genital lesions and vaginal discharge and dysuria Musculoskeletal:negative for back pain Behavioral/Psych: negative for abusive relationship, depression, illegal drug usage and tobacco use     Objective:    BP 122/79   Pulse 84   Wt 238 lb (108 kg)   LMP 05/06/2022 (Exact Date)   BMI 46.48 kg/m  General Appearance:    Alert, cooperative, no distress, appears stated age  Head:    Normocephalic, without obvious abnormality, atraumatic  Eyes:    PERRL, conjunctiva/corneas clear, EOM's intact, fundi    benign, both eyes  Ears:    Normal TM's and external ear canals, both ears  Nose:   Nares normal, septum midline, mucosa normal, no drainage    or sinus tenderness  Throat:   Lips, mucosa, and tongue normal; teeth and gums normal  Neck:   Supple, symmetrical, trachea midline, no adenopathy;    thyroid:  no enlargement/tenderness/nodules; no carotid   bruit or JVD  Back:     Symmetric, no curvature, ROM normal, no CVA tenderness  Lungs:     Clear to auscultation bilaterally, respirations unlabored  Chest Wall:    No tenderness or deformity   Heart:    Regular rate and rhythm, S1 and  S2 normal, no murmur, rub   or gallop  Breast Exam:    No tenderness, masses, or nipple abnormality  Abdomen:     Soft, non-tender, bowel sounds active all four quadrants,    no masses, no organomegaly  Genitalia:    Normal female without lesion, discharge or tenderness  Extremities:   Extremities normal, atraumatic, no cyanosis or edema  Pulses:   2+ and symmetric all extremities  Skin:   Skin color, texture, turgor normal, no rashes or lesions  Lymph nodes:   Cervical, supraclavicular, and axillary nodes normal  Neurologic:   CNII-XII intact, normal strength, sensation and reflexes    throughout      Lab Review Urine pregnancy test Labs reviewed yes Radiologic studies reviewed no  Assessment:    Pregnancy at [redacted]w[redacted]d weeks    Plan:    1. Supervision of other normal pregnancy, antepartum Rx: - Panorama Prenatal Test Full Panel - [facility] Custom - Cytology - PAP( [place]) - Cervicovaginal ancillary only( [place]) - HgB A1c - Culture, OB Urine -  CBC/D/Plt+RPR+Rh+ABO+RubIgG... - [place] MFM OB DETAIL +14 WK; Future - Prenat w/o A Vit-FeFum-FePo-FA (CONCEPT OB) 130-92.4-1 MG CAPS; Take 1 capsule by mouth daily.  Dispense: 90 capsule; Refill: 4   Prenatal vitamins.  Counseling provided regarding continued use of seat belts, cessation of alcohol consumption, smoking or use of illicit drugs; infection precautions i.e., influenza/TDAP immunizations, toxoplasmosis,CMV, parvovirus, listeria and varicella; workplace safety, exercise during pregnancy; routine dental care, safe medications, sexual activity, hot tubs, saunas, pools, travel, caffeine use, fish and methlymercury, potential toxins, hair treatments, varicose veins Weight gain recommendations per IOM guidelines reviewed: underweight/BMI< 18.5--> gain 28 - 40 lbs; normal weight/BMI 18.5 - 24.9--> gain 25 - 35 lbs; overweight/BMI 25 - 29.9--> gain 15 - 25 lbs; obese/BMI >30->gain  11 - 20 lbs Problem list reviewed and updated. FIRST/CF mutation testing/NIPT/QUAD SCREEN/fragile X/Ashkenazi Jewish population testing/Spinal muscular atrophy discussed: requested. Role of ultrasound in pregnancy discussed; fetal survey: requested. Amniocentesis discussed: not indicated.  Follow up in 5 weeks.  I have spent a total of 20 minutes of face-to-face time, excluding clinical staff time, reviewing notes and preparing to see patient, ordering tests and/or medications, and counseling the patient.   Clenton Pare Jodi Mourning, MD 07/22/2022

## 2022-07-23 LAB — CBC/D/PLT+RPR+RH+ABO+RUBIGG...
Antibody Screen: NEGATIVE
Basophils Absolute: 0 10*3/uL (ref 0.0–0.2)
Basos: 1 %
EOS (ABSOLUTE): 0.1 10*3/uL (ref 0.0–0.4)
Eos: 1 %
HCV Ab: NONREACTIVE
HIV Screen 4th Generation wRfx: NONREACTIVE
Hematocrit: 35.5 % (ref 34.0–46.6)
Hemoglobin: 12.1 g/dL (ref 11.1–15.9)
Hepatitis B Surface Ag: NEGATIVE
Immature Grans (Abs): 0 10*3/uL (ref 0.0–0.1)
Immature Granulocytes: 0 %
Lymphocytes Absolute: 2.3 10*3/uL (ref 0.7–3.1)
Lymphs: 26 %
MCH: 29.4 pg (ref 26.6–33.0)
MCHC: 34.1 g/dL (ref 31.5–35.7)
MCV: 86 fL (ref 79–97)
Monocytes Absolute: 0.5 10*3/uL (ref 0.1–0.9)
Monocytes: 5 %
Neutrophils Absolute: 5.7 10*3/uL (ref 1.4–7.0)
Neutrophils: 67 %
Platelets: 289 10*3/uL (ref 150–450)
RBC: 4.11 x10E6/uL (ref 3.77–5.28)
RDW: 12.9 % (ref 11.7–15.4)
RPR Ser Ql: NONREACTIVE
Rh Factor: POSITIVE
Rubella Antibodies, IGG: 6.39 index (ref >0.99)
WBC: 8.6 10*3/uL (ref 3.4–10.8)

## 2022-07-23 LAB — HEMOGLOBIN A1C
Est. average glucose Bld gHb Est-mCnc: 114 mg/dL
Hgb A1c MFr Bld: 5.6 % (ref 4.8–5.6)

## 2022-07-23 LAB — HCV INTERPRETATION

## 2022-07-24 LAB — URINE CULTURE, OB REFLEX

## 2022-07-24 LAB — CULTURE, OB URINE

## 2022-07-25 ENCOUNTER — Other Ambulatory Visit: Payer: Self-pay | Admitting: *Deleted

## 2022-07-25 DIAGNOSIS — Z3480 Encounter for supervision of other normal pregnancy, unspecified trimester: Secondary | ICD-10-CM

## 2022-07-25 LAB — CERVICOVAGINAL ANCILLARY ONLY
Bacterial Vaginitis (gardnerella): POSITIVE — AB
Candida Glabrata: NEGATIVE
Candida Vaginitis: NEGATIVE
Chlamydia: NEGATIVE
Comment: NEGATIVE
Comment: NEGATIVE
Comment: NEGATIVE
Comment: NEGATIVE
Comment: NEGATIVE
Comment: NORMAL
Neisseria Gonorrhea: NEGATIVE
Trichomonas: NEGATIVE

## 2022-07-25 MED ORDER — PRENATAL VITAMIN PLUS LOW IRON 27-1 MG PO TABS: 1.0000 | ORAL_TABLET | Freq: Every day | ORAL | 12 refills | Status: DC

## 2022-07-25 NOTE — Progress Notes (Signed)
RX PNV not covered by insurance. Alternate RX sent.

## 2022-07-26 ENCOUNTER — Other Ambulatory Visit: Payer: Self-pay | Admitting: Obstetrics

## 2022-07-26 ENCOUNTER — Telehealth: Payer: Self-pay | Admitting: Emergency Medicine

## 2022-07-26 DIAGNOSIS — N760 Acute vaginitis: Secondary | ICD-10-CM

## 2022-07-26 MED ORDER — METRONIDAZOLE 500 MG PO TABS: 500.0000 mg | ORAL_TABLET | Freq: Two times a day (BID) | ORAL | 2 refills | Status: DC

## 2022-07-26 NOTE — Telephone Encounter (Signed)
TC to pt to inform of results, RX

## 2022-07-28 LAB — CYTOLOGY - PAP
Diagnosis: NEGATIVE
Diagnosis: REACTIVE

## 2022-08-03 ENCOUNTER — Inpatient Hospital Stay (HOSPITAL_COMMUNITY)
Admission: AD | Admit: 2022-08-03 | Discharge: 2022-08-03 | Disposition: A | Discharge status: Home / Self Care | Payer: No Typology Code available for payment source | Attending: Obstetrics and Gynecology | Admitting: Obstetrics and Gynecology

## 2022-08-03 ENCOUNTER — Telehealth: Payer: Self-pay | Admitting: *Deleted

## 2022-08-03 ENCOUNTER — Other Ambulatory Visit: Payer: Self-pay

## 2022-08-03 DIAGNOSIS — O26891 Other specified pregnancy related conditions, first trimester: Secondary | ICD-10-CM | POA: Insufficient documentation

## 2022-08-03 DIAGNOSIS — Z3A11 11 weeks gestation of pregnancy: Secondary | ICD-10-CM | POA: Diagnosis not present

## 2022-08-03 DIAGNOSIS — R109 Unspecified abdominal pain: Secondary | ICD-10-CM | POA: Diagnosis not present

## 2022-08-03 DIAGNOSIS — O209 Hemorrhage in early pregnancy, unspecified: Secondary | ICD-10-CM

## 2022-08-03 DIAGNOSIS — Z3480 Encounter for supervision of other normal pregnancy, unspecified trimester: Secondary | ICD-10-CM

## 2022-08-03 NOTE — MAU Note (Signed)
Nicole Mclean is a 27 y.o. at [redacted]w[redacted]d here in MAU reporting: woke up this AM and used the restroom she saw some bloody discharge in the base of her toilet. States it looked like bloody show. No bleeding currently. Currently being treated for BV. Had some cramping earlier.   Onset of complaint: today  Pain score: 0/10  Vitals:   08/03/22 1149  BP: 113/65  Pulse: 93  Resp: 16  Temp: 98.5 F (36.9 C)  SpO2: 99%     CBI:[id number]  Lab orders placed from triage: none

## 2022-08-03 NOTE — Discharge Instructions (Signed)
US shows that you have a normal healthy pregnancy. We discussed that vaginal bleeding in the first trimester is common, and that 80-90% of patients will go on to have a normal pregnancy with a live delivery. The remainder are at increased risk for miscarriage, unfortunately there are no known interventions to mitigate this risk. We discussed return precautions including crescendo abdominal pain, heavy vaginal bleeding soaking >1 pad/hour, and fever.

## 2022-08-03 NOTE — MAU Provider Note (Signed)
History     [id number]  Arrival date and time: 08/03/22 1040    Chief Complaint  Patient presents with   Abdominal Pain   Vaginal Bleeding     HPI Nicole Mclean is a 27 y.o. at [redacted]w[redacted]d by early [place] with PMHx notable for twin pregnancy with loss of one gestation, who presents for bleeding and discharge.   Reports that since last night she has had some mild vaginal bleeding and discharge She is currently being treated for BV with oral pills by her primary No large gush of fluid, no contractions She called her OB this morning and was told to present to MAU for evaluation Has had some mild cramping on and off No intercourse for many weeks  O/Positive/-- (01/05 1246)  OB History     Gravida  4   Para  2   Term  2   Preterm  0   AB  1   Living  2      SAB  1   IAB  0   Ectopic  0   Multiple  0   Live Births  2           Past Medical History:  Diagnosis Date   Chlamydia 01/2016   Gonorrhea April 2015   Gonorrhea    Infection    UTI   STD (female)    Syphilis    UTI (urinary tract infection)     Past Surgical History:  Procedure Laterality Date   chin surgery     HAND SURGERY     Left  hand   mirena      mirena iud removed   RHYTIDECTOMY NECK / CHEEK / CHIN      Family History  Adopted: Yes  Problem Relation Age of Onset   Lung disease Mother    Depression Mother    Heart disease Father    Diabetes Father    Hypertension Father    Learning disabilities Neg Hx    Mental illness Neg Hx    Mental retardation Neg Hx    Vision loss Neg Hx    Varicose Veins Neg Hx     Social History   Socioeconomic History   Marital status: Single    Spouse name: Not on file   Number of children: Not on file   Years of education: Not on file   Highest education level: Not on file  Occupational History   Not on file  Tobacco Use   Smoking status: Never    Passive exposure: Never   Smokeless tobacco: Never  Vaping Use   Vaping Use: Never used   Substance and Sexual Activity   Alcohol use: No    Alcohol/week: 0.0 standard drinks of alcohol   Drug use: No   Sexual activity: Yes    Partners: Male    Birth control/protection: None  Other Topics Concern   Not on file  Social History Narrative   Not on file   Social Determinants of Health   Financial Resource Strain: Not on file  Food Insecurity: Not on file  Transportation Needs: Not on file  Physical Activity: Not on file  Stress: Not on file  Social Connections: Not on file  Intimate Partner Violence: Not on file    No Known Allergies  No current facility-administered medications on file prior to encounter.   Current Outpatient Medications on File Prior to Encounter  Medication Sig Dispense Refill   Blood Pressure Monitoring (BLOOD  PRESSURE KIT) DEVI 1 kit by Does not apply route once a week. 1 each 0   metroNIDAZOLE (FLAGYL) 500 MG tablet Take 1 tablet (500 mg total) by mouth 2 (two) times daily. 14 tablet 2   Prenat w/o A Vit-FeFum-FePo-FA (CONCEPT OB) 130-92.4-1 MG CAPS Take 1 capsule by mouth daily. 90 capsule 4   Prenatal Vit-Fe Fumarate-FA (PRENATAL VITAMIN PLUS LOW IRON) 27-1 MG TABS Take 1 tablet by mouth daily. 30 tablet 12     ROS Pertinent positives and negative per HPI, all others reviewed and negative  Physical Exam   BP 113/65 (BP Location: Right Arm)   Pulse 93   Temp 98.5 F (36.9 C) (Oral)   Resp 16   Ht 5' (1.524 m)   Wt 109.3 kg   LMP 05/06/2022 (Exact Date)   SpO2 99% Comment: room air  BMI 47.05 kg/m   Patient Vitals for the past 24 hrs:  BP Temp Temp src Pulse Resp SpO2 Height Weight  08/03/22 1149 113/65 98.5 F (36.9 C) Oral 93 16 99 % -- --  08/03/22 1140 -- -- -- -- -- -- 5' (1.524 m) 109.3 kg    Physical Exam Vitals reviewed.  Constitutional:      General: She is not in acute distress.    Appearance: She is well-developed. She is not diaphoretic.  Eyes:     General: No scleral icterus. Pulmonary:     Effort:  Pulmonary effort is normal. No respiratory distress.  Abdominal:     General: There is no distension.     Palpations: Abdomen is soft.     Tenderness: There is no abdominal tenderness. There is no guarding or rebound.  Skin:    General: Skin is warm and dry.  Neurological:     Mental Status: She is alert.     Coordination: Coordination normal.      Cervical Exam    Bedside Ultrasound Pt informed that the ultrasound is considered a limited OB ultrasound and is not intended to be a complete ultrasound exam.  Patient also informed that the ultrasound is not being completed with the intent of assessing for fetal or placental anomalies or any pelvic abnormalities.  Explained that the purpose of today's ultrasound is to assess for  viability.  Patient acknowledges the purpose of the exam and the limitations of the study.    My interpretation: Viable IUP with fetal heart rate 167 bpm by M-mode noted, subjectively normal fluid   Labs No results found for this or any previous visit (from the past 24 hour(s)).  Imaging No results found.  MAU Course  Procedures Lab Orders  No laboratory test(s) ordered today   No orders of the defined types were placed in this encounter.  Imaging Orders  No imaging studies ordered today    MDM moderate  Assessment and Plan  #Vaginal bleeding in pregnancy, first trimester #Abdominal pain in pregnancy, trimester #[redacted] weeks gestation of pregnancy US shows viable IUP with normal FHR. We discussed that vaginal bleeding in the first trimester is common, and that 80-90% of patients will go on to have a normal pregnancy with a live delivery. The remainder are at increased risk for miscarriage, unfortunately there are no known interventions to mitigate this risk. O/Positive/-- (01/05 1246), rhogam not indicated. We discussed return precautions including crescendo abdominal pain, heavy vaginal bleeding soaking >1 pad/hour, and fever.   Dispo: discharged to  home in stable condition.    Clarnce Flock, MD/MPH 08/03/22  12:08 PM  Allergies as of 08/03/2022   No Known Allergies      Medication List     TAKE these medications    Blood Pressure Kit Devi 1 kit by Does not apply route once a week.   Concept OB 130-92.4-1 MG Caps Take 1 capsule by mouth daily.   metroNIDAZOLE 500 MG tablet Commonly known as: FLAGYL Take 1 tablet (500 mg total) by mouth 2 (two) times daily.   Prenatal Vitamin Plus Low Iron 27-1 MG Tabs Take 1 tablet by mouth daily.

## 2022-08-03 NOTE — Telephone Encounter (Signed)
TC from pt reporting bloody discharge that "filled the bottom of my toilet." Reports continued bloody discharge and abdominal cramping overnight. Somewhat improved now. Advised pt to seek care in MAU to rule out threatened SAB. Pt verbalized understanding.

## 2022-08-15 LAB — PANORAMA PRENATAL TEST FULL PANEL:PANORAMA TEST PLUS 5 ADDITIONAL MICRODELETIONS: FETAL FRACTION: 4.9

## 2022-08-15 LAB — HORIZON CUSTOM: REPORT SUMMARY: POSITIVE — AB

## 2022-08-16 ENCOUNTER — Other Ambulatory Visit: Payer: Self-pay

## 2022-08-16 DIAGNOSIS — D560 Alpha thalassemia: Secondary | ICD-10-CM

## 2022-08-19 ENCOUNTER — Ambulatory Visit (INDEPENDENT_AMBULATORY_CARE_PROVIDER_SITE_OTHER): Payer: No Typology Code available for payment source | Admitting: Certified Nurse Midwife

## 2022-08-19 ENCOUNTER — Encounter: Payer: Self-pay | Admitting: Certified Nurse Midwife

## 2022-08-19 VITALS — BP 134/80 | HR 104 | Wt 243.2 lb

## 2022-08-19 DIAGNOSIS — O209 Hemorrhage in early pregnancy, unspecified: Secondary | ICD-10-CM

## 2022-08-19 DIAGNOSIS — Z3A14 14 weeks gestation of pregnancy: Secondary | ICD-10-CM

## 2022-08-19 DIAGNOSIS — Z3492 Encounter for supervision of normal pregnancy, unspecified, second trimester: Secondary | ICD-10-CM

## 2022-08-19 NOTE — Progress Notes (Signed)
Pt presents for ROB visit. No concerns at this time.  
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
## 2022-08-24 ENCOUNTER — Encounter: Payer: Self-pay | Admitting: Certified Nurse Midwife

## 2022-08-25 ENCOUNTER — Other Ambulatory Visit: Payer: Self-pay

## 2022-08-25 DIAGNOSIS — B379 Candidiasis, unspecified: Secondary | ICD-10-CM

## 2022-08-25 MED ORDER — TERCONAZOLE 0.4 % VA CREA: 1.0000 | TOPICAL_CREAM | Freq: Every day | VAGINAL | 0 refills | Status: DC

## 2022-08-30 ENCOUNTER — Encounter: Payer: No Typology Code available for payment source | Admitting: Obstetrics and Gynecology

## 2022-09-01 ENCOUNTER — Encounter: Payer: No Typology Code available for payment source | Admitting: Obstetrics

## 2022-09-14 ENCOUNTER — Ambulatory Visit: Payer: No Typology Code available for payment source

## 2022-09-16 ENCOUNTER — Encounter: Payer: No Typology Code available for payment source | Admitting: Obstetrics

## 2022-09-21 ENCOUNTER — Other Ambulatory Visit: Payer: Self-pay

## 2022-09-21 NOTE — Progress Notes (Signed)
Rx for breast pump sent to [facility]

## 2022-09-22 ENCOUNTER — Ambulatory Visit (INDEPENDENT_AMBULATORY_CARE_PROVIDER_SITE_OTHER): Payer: No Typology Code available for payment source | Admitting: Obstetrics

## 2022-09-22 ENCOUNTER — Ambulatory Visit: Payer: No Typology Code available for payment source

## 2022-09-22 ENCOUNTER — Ambulatory Visit: Payer: No Typology Code available for payment source | Admitting: Maternal & Fetal Medicine

## 2022-09-22 ENCOUNTER — Other Ambulatory Visit: Payer: Self-pay

## 2022-09-22 ENCOUNTER — Ambulatory Visit: Payer: No Typology Code available for payment source | Attending: Obstetrics

## 2022-09-22 ENCOUNTER — Ambulatory Visit (HOSPITAL_BASED_OUTPATIENT_CLINIC_OR_DEPARTMENT_OTHER): Payer: No Typology Code available for payment source

## 2022-09-22 ENCOUNTER — Encounter: Payer: Self-pay | Admitting: Obstetrics

## 2022-09-22 VITALS — BP 121/62 | HR 87

## 2022-09-22 VITALS — BP 120/75 | HR 100 | Wt 253.0 lb

## 2022-09-22 DIAGNOSIS — O3110X0 Continuing pregnancy after spontaneous abortion of one fetus or more, unspecified trimester, not applicable or unspecified: Secondary | ICD-10-CM

## 2022-09-22 DIAGNOSIS — Z362 Encounter for other antenatal screening follow-up: Secondary | ICD-10-CM | POA: Insufficient documentation

## 2022-09-22 DIAGNOSIS — D563 Thalassemia minor: Secondary | ICD-10-CM

## 2022-09-22 DIAGNOSIS — B009 Herpesviral infection, unspecified: Secondary | ICD-10-CM | POA: Diagnosis not present

## 2022-09-22 DIAGNOSIS — O99212 Obesity complicating pregnancy, second trimester: Secondary | ICD-10-CM

## 2022-09-22 DIAGNOSIS — Z3A19 19 weeks gestation of pregnancy: Secondary | ICD-10-CM

## 2022-09-22 DIAGNOSIS — Z3492 Encounter for supervision of normal pregnancy, unspecified, second trimester: Secondary | ICD-10-CM

## 2022-09-22 DIAGNOSIS — E6609 Other obesity due to excess calories: Secondary | ICD-10-CM | POA: Diagnosis not present

## 2022-09-22 DIAGNOSIS — E669 Obesity, unspecified: Secondary | ICD-10-CM | POA: Diagnosis not present

## 2022-09-22 DIAGNOSIS — Z3480 Encounter for supervision of other normal pregnancy, unspecified trimester: Secondary | ICD-10-CM

## 2022-09-22 DIAGNOSIS — O285 Abnormal chromosomal and genetic finding on antenatal screening of mother: Secondary | ICD-10-CM

## 2022-09-22 DIAGNOSIS — Z363 Encounter for antenatal screening for malformations: Secondary | ICD-10-CM | POA: Diagnosis not present

## 2022-09-22 DIAGNOSIS — O99210 Obesity complicating pregnancy, unspecified trimester: Secondary | ICD-10-CM

## 2022-09-22 DIAGNOSIS — O99213 Obesity complicating pregnancy, third trimester: Secondary | ICD-10-CM | POA: Diagnosis not present

## 2022-09-22 DIAGNOSIS — O321XX0 Maternal care for breech presentation, not applicable or unspecified: Secondary | ICD-10-CM | POA: Insufficient documentation

## 2022-09-22 DIAGNOSIS — O0990 Supervision of high risk pregnancy, unspecified, unspecified trimester: Secondary | ICD-10-CM | POA: Insufficient documentation

## 2022-09-22 DIAGNOSIS — O98512 Other viral diseases complicating pregnancy, second trimester: Secondary | ICD-10-CM | POA: Insufficient documentation

## 2022-09-22 DIAGNOSIS — O98519 Other viral diseases complicating pregnancy, unspecified trimester: Secondary | ICD-10-CM

## 2022-09-22 DIAGNOSIS — D560 Alpha thalassemia: Secondary | ICD-10-CM

## 2022-09-22 DIAGNOSIS — O30009 Twin pregnancy, unspecified number of placenta and unspecified number of amniotic sacs, unspecified trimester: Secondary | ICD-10-CM

## 2022-09-22 DIAGNOSIS — Z148 Genetic carrier of other disease: Secondary | ICD-10-CM

## 2022-09-22 NOTE — Progress Notes (Signed)
Patient information  Patient Name: Nicole Mclean  Patient MRN:   [id number]  Referring practice: MFM Referring Provider: [place]  Medical/Obstetric History   Past pregnancies OB History  Gravida Para Term Preterm AB Living  '4 2 2 '$ 0 1 2  SAB IAB Ectopic Multiple Live Births  1 0 0 0 2    # Outcome Date GA Lbr Len/2nd Weight Sex Delivery Anes PTL Lv  4 Current           3 Term 07/02/15 88w1d09:15 / 01:45 6 lb 12.5 oz (3.075 kg) M Vag-Spont EPI  LIV  2 Term 06/07/14 351w1d5:32 / 01:19 7 lb 5.1 oz (3.32 kg) M Vag-Spont EPI  LIV  1 SAB 2014             Dating method LMP: Patient's last menstrual period was 05/06/2022 (exact date).  Exact. Menstrual cycles: Regular every 28 days. EDD: Estimated Date of Delivery: 02/16/23 based on Reliable LMP.   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 Fetal cardiac activity:  Observed and appears normal. Presentation: Breech. The anatomic structures that were well seen appear normal without evidence of soft markers. The fetal anatomic survey is completed.  Fetal biometry shows the estimated fetal weight is consistent with EDD. Amniotic fluid volume: Within normal limits MVP: 10.1 cm. Placenta: Anterior.   Allergies  No Known Allergies   Medications   Current Outpatient Medications on File Prior to Visit  Medication Sig Dispense Refill   Blood Pressure Monitoring (BLOOD PRESSURE KIT) DEVI 1 kit by Does not apply route once a week. 1 each 0   metroNIDAZOLE (FLAGYL) 500  MG tablet Take 1 tablet (500 mg total) by mouth 2 (two) times daily. (Patient not taking: Reported on 09/22/2022) 14 tablet 2   Prenat w/o A Vit-FeFum-FePo-FA (CONCEPT OB) 130-92.4-1 MG CAPS Take 1 capsule by mouth daily. (Patient not taking: Reported on 09/22/2022) 90 capsule 4   Prenatal Vit-Fe Fumarate-FA (PRENATAL VITAMIN PLUS LOW IRON) 27-1 MG TABS Take 1 tablet by mouth daily. 30 tablet 12   terconazole (TERAZOL 7) 0.4 % vaginal cream Place 1 applicator vaginally at bedtime. (Patient not taking: Reported on 09/22/2022) 45 g 0   No current facility-administered medications on file prior to visit.     Patient History  Mixtli E NORELI FREYMANs a [age].o. [id number] 1946w0dre for ultrasound and consultation.   Counseling: I discussed the patient's medical history and the impact it would have on the pregnancy.  We discussed that in general patients with a BMI of greater than 40 have a higher rate of various pregnancy complications such as increased risk of preterm birth, gestational diabetes, hypertensive disorders in pregnancy, need for operative delivery, increased risk of inability to detect certain congenital anomalies on ultrasound and increased risk of delivery complications such as wound infection if a cesarean delivery were required.  I discussed the typical clinical course involves further ultrasounds to assess the fetal weight  due to the inability to use the fundal height accurately assess the fetal growth.  I discussed the role of antenatal testing to start around 34 weeks or sooner if indicated.  Delivery timing is typically not altered by BMI alone.  The patient verbalized understanding and is agreeable to the plan.  Review of Systems: A review of systems was performed and was negative except per HPI   Vitals and Physical Exam There were no vitals filed for this visit. 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 Plan -Detailed ultrasound was done today without abnormalities -Aspirin 81 mg can be considered for preeclampsia prophylasis -Follow-up anatomy and fetal growth in 4 to 6 weeks -Serial growth ultrasounds starting around 28 weeks to monitor for fetal growth restriction -Antenatal testing to start around 34 weeks due to the increased risk of stillbirth and high risk pregnancy -GC appointment to follow after today's appointment to discuss FOB testing of alpha thal -Valtrex starting around 36w or sooner if preterm labor occurs -Delivery pending clinical course  Future Appointments  Date Time Provider [place]  09/22/2022 12:00 PM [facility] MD RM [facility]  09/22/2022  1:30 PM Shelly Bombard, MD [facility] None    Valeda Malm, DO  I spent 30 minutes reviewing the patients chart, including labs and images as well as counseling the patient about her medical conditions.  Valeda Malm  MFM, [place]   09/22/2022  11:01 AM   Future Appointments   Future Appointments  Date Time Provider [place]  09/22/2022 12:00 PM [facility] MD RM [facility]  09/22/2022  1:30 PM Shelly Bombard, MD [facility] None
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
## 2022-09-22 NOTE — Progress Notes (Signed)
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

## 2022-09-23 NOTE — Progress Notes (Signed)
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   EDD: 02/10/2023 LMP: 05/06/2022 Referring Provider:  Shelly Bombard, MD  EGA: 21w0dGenetic Counselor: AStaci Righter MS, [place] [facility] Hx:[id number] of Appointment: 09/22/2022  Accompanied by: UKristine LineaFace to Face Time: 30 Minutes   Previous Testing Completed: Geraline previously completed cell-free DNA screening (cfDNA) in this pregnancy. The result is low risk, consistent with a female fetus. This screening significantly reduces the risk that the current pregnancy has Down syndrome, Trisomy 139 Trisomy 156 and common sex chromosome conditions, however, the risk is not zero given the limitations of cfDNA. Additionally, there are many genetic conditions that cannot be detected by cfDNA.  Eliza previously completed carrier screening. She screened to be a silent carrier for Alpha Thalassemia. She screened to not be a carrier for Cystic Fibrosis (CF), Spinal Muscular Atrophy (SMA), and Beta Hemoglobinopathies. A negative result on carrier screening reduces the likelihood of being a carrier, however, does not entirely rule out the possibility.   Medical & Family History:  This is Nicole Mclean's 4th pregnancy. She has 2 living, healthy sons. She has had one spontaneous loss. Reports she takes prenatal vitamins. Reports bleeding which stopped at week 14 in pregnancy. Denies personal history of diabetes, high blood pressure, thyroid conditions, and seizures. Denies infections and fevers in this pregnancy. Denies using tobacco, alcohol, or street drugs in this pregnancy. Pranavi reports her reproductive partner (Thurmond Butts is affected with Addison's disease.  [place] denied a family history of chromosome conditions, intellectual disability, autism, birth defects, bone/skeletal  disorders, blindness, deafness, blood disorders, neuromuscular disorders, still births, early infant deaths, and 3 or more pregnancy losses for one person on her prenatal intake questionnaire.     Genetic Counseling:   Maternal Silent Carrier for Alpha Thalassemia. Alpha Thalassemia refers to a group of autosomal recessive blood disorders that reduce the amount of hemoglobin, the protein in red blood cells that carries oxygen to tissues throughout the body. Hemoglobin is made up of both alpha globin and beta globin proteins. Alpha Thalassemia is different in its inheritance compared to other hemoglobinopathies as there are two alpha-globin genes on each chromosome 16 (??/??). Alpha Thalassemia occurs when three or more of the four alpha-globin genes are deleted. Gary is a silent carrier for alpha thalassemia (??/-?) caused by the pathogenic alpha 3.7 deletion of the HBA2 gene. With this result, we know [facility] three functional copies of the alpha-globin genes while her 4th alpha-globin gene is deleted. Each of Kielyn's children will either inherit two functional genes (??) or one functional gene and one deletion (-?) from her.  Kellin is not at increased risk to have a baby with fetal hydrops due to Hemoglobin Barts disease (four deleted alpha-globin genes: --/--) regardless of Ryan's carrier status. Having four deleted alpha-globin genes results in severe anemia. Affected babies develop symptoms before birth and without treatment typically do not survive the newborn period. If RThurmond Buttsis found to be an Alpha Thalassemia carrier in the cis configuration (two deleted alpha-globin genes on the same chromosome: ??/--) there would be a 25% risk for the current pregnancy to be affected with Hemoglobin H Disease (three deleted alpha-globin genes: --/-?). Clinical features of this condition are highly variable and generally develop in the first years of  life. People with severe symptoms may have chronic anemia, liver  disease, and bone changes. Some affected individuals do not require blood transfusions while others may require occasional blood transfusions throughout their lifetime.  Given Nicole Mclean's carrier screening result, carrier screening for Thurmond Butts was offered to determine risk for the current pregnancy. If Thurmond Butts is found to be a carrier for Alpha Thalassemia, given his Black/African American ancestry, it is more likely for him to be a silent carrier (??/-?) or a carrier in the trans configuration (two deleted or changed alpha-globin genes on opposite chromosomes: -?/-?), as the cis configuration (??/--) has been reported very rarely in individuals with Black/African American ancestry. If both members of a couple are known to be carriers, diagnostic testing is available to determine if the pregnancy is affected. If diagnostic testing via amniocentesis is not desired, Alpha Thalassemia testing can be completed after birth.  Family History of Addison's Disease. Elise reports her reproductive partner Thurmond Butts) is affected with Addison's disease. Addison's disease is one of 3 different types of X-Linked Adrenoleukodystrophy and represents approximately 10% of cases. Symptoms begin between childhood and adulthood and the primary symptom is adrenocortical insufficiency. Shakirra reports that Thurmond Butts is doing well and she does not have any questions or concerns about Addison's Disease at this time. We reviewed that Addison's Disease has X-linked inheritance and that all three of her sons (including the current female pregnancy) will only inherit 4 Y chromosome. Genetic counseling would be recommended to discuss Addison's Disease if any future pregnancies were to be female as all of Ryan's daughters will inherit his X chromosome.      Patient Plan:  Proceed with: Routine prenatal care Informed consent was obtained. All questions were answered.  Declined: Alpha thalassemia carrier screening for Thurmond Butts (Liora's reproductive partner)    Thank you for sharing in the care of [place] with [place].  Please do not hesitate to contact us if you have any questions.  Staci Righter, MS, [facility]

## 2022-09-24 LAB — AFP, SERUM, OPEN SPINA BIFIDA
AFP MoM: 0.64
AFP Value: 23.6 ng/mL
Gest. Age on Collection Date: 19 weeks
Maternal Age At EDD: 28.1 yr
OSBR Risk 1 IN: 10000
Test Results:: NEGATIVE
Weight: 253 [lb_av]

## 2022-09-30 ENCOUNTER — Telehealth: Payer: Self-pay | Admitting: *Deleted

## 2022-09-30 NOTE — Telephone Encounter (Signed)
TC from pt reporting cold symptoms in pregnancy. Asking for safe med list to be sent via [facility]. Done. Advised pt to seek care for any SOB or more urgent symptoms.

## 2022-10-10 ENCOUNTER — Other Ambulatory Visit: Payer: Self-pay

## 2022-10-10 ENCOUNTER — Observation Stay (HOSPITAL_COMMUNITY)
Admission: AD | Admit: 2022-10-10 | Discharge: 2022-10-12 | Disposition: A | Discharge status: Home / Self Care | Payer: No Typology Code available for payment source | Attending: Obstetrics & Gynecology | Admitting: Obstetrics & Gynecology

## 2022-10-10 ENCOUNTER — Encounter (HOSPITAL_COMMUNITY): Payer: Self-pay | Admitting: Family Medicine

## 2022-10-10 ENCOUNTER — Inpatient Hospital Stay (HOSPITAL_COMMUNITY): Payer: No Typology Code available for payment source

## 2022-10-10 DIAGNOSIS — K625 Hemorrhage of anus and rectum: Secondary | ICD-10-CM | POA: Diagnosis not present

## 2022-10-10 DIAGNOSIS — Z3A21 21 weeks gestation of pregnancy: Secondary | ICD-10-CM | POA: Diagnosis not present

## 2022-10-10 DIAGNOSIS — K641 Second degree hemorrhoids: Principal | ICD-10-CM | POA: Insufficient documentation

## 2022-10-10 DIAGNOSIS — K921 Melena: Secondary | ICD-10-CM | POA: Diagnosis not present

## 2022-10-10 DIAGNOSIS — K649 Unspecified hemorrhoids: Secondary | ICD-10-CM

## 2022-10-10 DIAGNOSIS — Z3480 Encounter for supervision of other normal pregnancy, unspecified trimester: Secondary | ICD-10-CM

## 2022-10-10 HISTORY — DX: Unspecified hemorrhoids: K64.9

## 2022-10-10 LAB — COMPREHENSIVE METABOLIC PANEL
ALT: 25 U/L (ref 0–44)
AST: 17 U/L (ref 15–41)
Albumin: 2.9 g/dL — ABNORMAL LOW (ref 3.5–5.0)
Alkaline Phosphatase: 74 U/L (ref 38–126)
Anion gap: 7 (ref 5–15)
BUN: <5 mg/dL — ABNORMAL LOW (ref 6–20)
CO2: 25 mmol/L (ref 22–32)
Calcium: 9 mg/dL (ref 8.9–10.3)
Chloride: 104 mmol/L (ref 98–111)
Creatinine, Ser: 0.58 mg/dL (ref 0.44–1.00)
GFR, Estimated: >60 mL/min (ref >60)
Glucose, Bld: 98 mg/dL (ref 70–99)
Potassium: 3.6 mmol/L (ref 3.5–5.1)
Sodium: 136 mmol/L (ref 135–145)
Total Bilirubin: 0.4 mg/dL (ref 0.3–1.2)
Total Protein: 6.1 g/dL — ABNORMAL LOW (ref 6.5–8.1)

## 2022-10-10 LAB — URINALYSIS, ROUTINE W REFLEX MICROSCOPIC
Bilirubin Urine: NEGATIVE
Glucose, UA: NEGATIVE mg/dL
Hgb urine dipstick: NEGATIVE
Ketones, ur: NEGATIVE mg/dL
Leukocytes,Ua: NEGATIVE
Nitrite: NEGATIVE
Protein, ur: NEGATIVE mg/dL
Specific Gravity, Urine: 1.015 (ref 1.005–1.030)
pH: 6 (ref 5.0–8.0)

## 2022-10-10 LAB — TYPE AND SCREEN
ABO/RH(D): O POS
Antibody Screen: NEGATIVE

## 2022-10-10 LAB — CBC
HCT: 33.5 % — ABNORMAL LOW (ref 36.0–46.0)
Hemoglobin: 11.6 g/dL — ABNORMAL LOW (ref 12.0–15.0)
MCH: 29.8 pg (ref 26.0–34.0)
MCHC: 34.6 g/dL (ref 30.0–36.0)
MCV: 86.1 fL (ref 80.0–100.0)
Platelets: 294 10*3/uL (ref 150–400)
RBC: 3.89 MIL/uL (ref 3.87–5.11)
RDW: 12.9 % (ref 11.5–15.5)
WBC: 10.2 10*3/uL (ref 4.0–10.5)
nRBC: 0 % (ref 0.0–0.2)

## 2022-10-10 LAB — WET PREP, GENITAL
Sperm: NONE SEEN
Trich, Wet Prep: NONE SEEN
WBC, Wet Prep HPF POC: >=10 — AB (ref <10)
Yeast Wet Prep HPF POC: NONE SEEN

## 2022-10-10 LAB — C-REACTIVE PROTEIN: CRP: 0.7 mg/dL (ref <1.0)

## 2022-10-10 LAB — SEDIMENTATION RATE: Sed Rate: 29 mm/hr — ABNORMAL HIGH (ref 0–22)

## 2022-10-10 MED ORDER — LACTATED RINGERS IV SOLN
125.0000 mL/h | INTRAVENOUS | Status: AC
  Administered 2022-10-11: 125 mL/h via INTRAVENOUS

## 2022-10-10 MED ORDER — ACETAMINOPHEN 325 MG PO TABS
650.0000 mg | ORAL_TABLET | ORAL | Status: DC | PRN
  Administered 2022-10-11: 650 mg via ORAL
  Filled 2022-10-10: qty 2

## 2022-10-10 NOTE — MAU Provider Note (Signed)
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 Reports that the bleeding started yesterday afternoon.  She states that there was enough water return to the toilet water completely red and she has a picture of this.  Reports noticing every time she feels pressure in her rectum she goes to have a bowel movement and it is a clot.  Was hoping it would stop but it continued throughout the night and into this morning so she came to the emergency department.  Patient denies any history of rectal bleeding in the past.  Patient does report that she has had hemorrhoids since she was very young but has never had issues with them bleeding.  Is unsure of her family history because she is adopted so does not know if there is any history of GI issues in her family.  Denies any symptoms of anemia.  Reports some right upper quadrant pain.  Denies any rectal trauma.   OB History     Gravida  4   Para  2   Term  2   Preterm  0   AB  1   Living  2      SAB  1   IAB  0   Ectopic  0   Multiple  0   Live Births  2           Past Medical History:  Diagnosis Date   Chlamydia 01/2016   Gonorrhea April 2015   Gonorrhea    Infection    UTI   STD (female)    Syphilis    UTI (urinary tract infection)     Past Surgical History:  Procedure Laterality Date   chin surgery     HAND SURGERY     Left  hand   mirena      mirena iud removed   RHYTIDECTOMY NECK / CHEEK / CHIN      Family History  Adopted: Yes  Problem Relation Age of Onset   Lung disease Mother    Depression Mother    Heart disease Father    Diabetes Father    Hypertension Father    Learning disabilities Neg Hx    Mental illness Neg Hx    Mental retardation Neg Hx    Vision loss Neg Hx    Varicose Veins Neg Hx    Asthma Neg Hx     Social  History   Tobacco Use   Smoking status: Never    Passive exposure: Never   Smokeless tobacco: Never  Vaping Use   Vaping Use: Never used  Substance Use Topics   Alcohol use: No    Alcohol/week: 0.0 standard drinks of alcohol   Drug use: No    Allergies: No Known Allergies  Medications Prior to Admission  Medication Sig Dispense Refill Last Dose   Blood Pressure Monitoring (BLOOD PRESSURE KIT) DEVI 1 kit by Does not apply route once a week. 1 each 0    metroNIDAZOLE (FLAGYL) 500 MG tablet Take 1 tablet (500 mg total) by mouth 2 (two) times daily. (Patient not taking: Reported on 09/22/2022) 14 tablet 2    Prenat w/o A Vit-FeFum-FePo-FA (CONCEPT OB) 130-92.4-1 MG CAPS Take 1 capsule by mouth daily. (Patient not taking: Reported on 09/22/2022) 90 capsule  4    Prenatal Vit-Fe Fumarate-FA (PRENATAL VITAMIN PLUS LOW IRON) 27-1 MG TABS Take 1 tablet by mouth daily. 30 tablet 12    terconazole (TERAZOL 7) 0.4 % vaginal cream Place 1 applicator vaginally at bedtime. (Patient not taking: Reported on 09/22/2022) 45 g 0     Review of Systems  Constitutional:  Negative for chills and fever.  HENT:  Negative for congestion and rhinorrhea.   Respiratory:  Negative for shortness of breath.   Gastrointestinal:  Positive for abdominal pain (RUQ), anal bleeding and blood in stool. Negative for nausea and vomiting.  Endocrine: Negative for polyuria.  Genitourinary:  Positive for vaginal discharge. Negative for difficulty urinating, vaginal bleeding and vaginal pain.  Musculoskeletal:  Negative for arthralgias.  Neurological:  Negative for headaches.   Physical Exam   Blood pressure 139/76, pulse (!) 110, temperature 98.3 F (36.8 C), temperature source Oral, resp. rate 14, height 5\' 1"  (1.549 m), weight 114.9 kg, last menstrual period 05/06/2022, SpO2 100 %.  Physical Exam Vitals reviewed.  Constitutional:      Appearance: Normal appearance.  HENT:     Head: Normocephalic and atraumatic.      Nose: Nose normal. No congestion.  Eyes:     Extraocular Movements: Extraocular movements intact.     Pupils: Pupils are equal, round, and reactive to light.  Cardiovascular:     Rate and Rhythm: Normal rate.  Pulmonary:     Effort: Pulmonary effort is normal. No respiratory distress.  Abdominal:     General: There is no distension.     Palpations: There is no mass.     Tenderness: There is abdominal tenderness (RUQ).  Genitourinary:    General: Normal vulva.     Comments: Visible nonbleeding external hemorrhoid.  DRE performed with no blood on glove, no palpable internal hemorrhoids. Skin:    General: Skin is warm.     Capillary Refill: Capillary refill takes less than 2 seconds.  Neurological:     General: No focal deficit present.     Mental Status: She is alert.    MAU Course  Procedures  MDM CBC CMP RUQ ultrasound ESR CRP Fecal lactoferrin GI consult  Assessment and Plan  Nicole Mclean is a 27 yo G4P2012 at 21 weeks 4 days presenting for painless rectal bleeding.  Rectal bleed First noticed rectal bleed 1630 on 3/24.  Had a bowel movement which was full of blood.  Happened again this morning and she noticed that she passed clots whenever she passed or has bowel movements.  CBC slightly decreased hemoglobin from prior evaluation but otherwise reassuring.  CMP reassuring.  Right upper quadrant ultrasound done due to right upper quadrant pain showing possible hepatic steatosis and cholelithiasis but no obstructing stones.  Consulted GI who reported they will evaluate her but asked that we admit her overnight for observation.  Spoke with [facility] second attending who is agreeable to the admission. - Ad[facility] to [facility] specialty care - A.m. CBC - Inflammatory labs ordered - GI consulted, appreciate their assistance in management of this patient   21 weeks of pregnancy Fetal monitoring reassuring.  No pregnancy complaints at this time.  Happening to [facility] specialty care.  Concepcion Living 10/10/2022, 2:43 PM

## 2022-10-10 NOTE — H&P (Signed)
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 Reports that the bleeding started yesterday afternoon.  She states that there was enough water return to the toilet water completely red and she has a picture of this.  Reports noticing every time she feels pressure in her rectum she goes to have a bowel movement and it is a clot.  Was hoping it would stop but it continued throughout the night and into this morning so she came to the emergency department.  Patient denies any history of rectal bleeding in the past.  Patient does report that she has had hemorrhoids since she was very young but has never had issues with them bleeding.  Is unsure of her family history because she is adopted so does not know if there is any history of GI issues in her family.  Denies any symptoms of anemia.  Reports some right upper quadrant pain.  Denies any rectal trauma.     OB History       Gravida  4   Para  2   Term  2   Preterm  0   AB  1   Mclean  2        SAB  1   IAB  0   Ectopic  0   Multiple  0   Live Births  2                   Past Medical History:  Diagnosis Date   Chlamydia 01/2016   Gonorrhea April 2015   Gonorrhea     Infection      UTI   STD (female)     Syphilis     UTI (urinary tract infection)             Past Surgical History:  Procedure Laterality Date   chin surgery       HAND SURGERY        Left  hand   mirena         mirena iud removed   RHYTIDECTOMY NECK / CHEEK / CHIN               Family History  Adopted: Yes  Problem Relation Age of Onset   Lung disease Mother     Depression Mother     Heart disease Father     Diabetes Father     Hypertension Father     Learning disabilities Neg Hx     Mental illness Neg Hx     Mental retardation Neg Hx     Vision loss Neg Hx      Varicose Veins Neg Hx     Asthma Neg Hx        Social History         Tobacco Use   Smoking status: Never      Passive exposure: Never   Smokeless tobacco: Never  Vaping Use   Vaping Use: Never used  Substance Use Topics   Alcohol use: No      Alcohol/week: 0.0 standard drinks of alcohol   Drug use: No      Allergies: No Known Allergies          Medications Prior to Admission  Medication Sig Dispense Refill Last Dose   Blood Pressure Monitoring (BLOOD PRESSURE KIT) DEVI 1 kit by Does not apply route once a week. 1 each 0     metroNIDAZOLE (FLAGYL) 500 MG tablet Take 1 tablet (500 mg total) by mouth 2 (two) times daily. (Patient not taking: Reported on 09/22/2022) 14 tablet 2     Prenat w/o A Vit-FeFum-FePo-FA (CONCEPT OB) 130-92.4-1 MG CAPS Take 1 capsule by mouth daily. (Patient not taking: Reported on 09/22/2022) 90 capsule 4     Prenatal Vit-Fe Fumarate-FA (PRENATAL VITAMIN PLUS LOW IRON) 27-1 MG TABS Take 1 tablet by mouth daily. 30 tablet 12     terconazole (TERAZOL 7) 0.4 % vaginal cream Place 1 applicator vaginally at bedtime. (Patient not taking: Reported on 09/22/2022) 45 g 0        Review of Systems  Constitutional:  Negative for chills and fever.  HENT:  Negative for congestion and rhinorrhea.   Respiratory:  Negative for shortness of breath.   Gastrointestinal:  Positive for abdominal pain (RUQ), anal bleeding and blood in stool. Negative for nausea and vomiting.  Endocrine: Negative for polyuria.  Genitourinary:  Positive for vaginal discharge. Negative for difficulty urinating, vaginal bleeding and vaginal pain.  Musculoskeletal:  Negative for arthralgias.  Neurological:  Negative for headaches.    Physical Exam    Blood pressure 139/76, pulse (!) 110, temperature 98.3 F (36.8 C), temperature source Oral, resp. rate 14, height 5\' 1"  (1.549 m), weight 114.9 kg, last menstrual period 05/06/2022, SpO2 100 %.   Physical Exam Vitals reviewed.  Constitutional:       Appearance: Normal appearance.  HENT:     Head: Normocephalic and atraumatic.     Nose: Nose normal. No congestion.  Eyes:     Extraocular Movements: Extraocular movements intact.     Pupils: Pupils are equal, round, and reactive to light.  Cardiovascular:     Rate and Rhythm: Normal rate.  Pulmonary:     Effort: Pulmonary effort is normal. No respiratory distress.  Abdominal:     General: There is no distension.     Palpations: There is no mass.     Tenderness: There is abdominal tenderness (RUQ).  Genitourinary:    General: Normal vulva.     Comments: Visible nonbleeding external hemorrhoid.  DRE performed with no blood on glove, no palpable internal hemorrhoids. Skin:    General: Skin is warm.     Capillary Refill: Capillary refill takes less than 2 seconds.  Neurological:     General: No focal deficit present.     Mental Status: She is alert.     MAU Course  Procedures   MDM CBC CMP RUQ ultrasound ESR CRP Fecal lactoferrin GI consult   Assessment and Plan  Nicole Mclean is a 27 yo G4P2012 at 21 weeks 4 days presenting for painless rectal bleeding.   Rectal bleed First noticed rectal bleed 1630 on 3/24.  Had a bowel movement which was full of blood.  Happened again this morning and she noticed that she passed clots whenever she passed or has bowel movements.  CBC slightly decreased hemoglobin from prior evaluation but otherwise reassuring.  CMP reassuring.  Right upper quadrant ultrasound done due to right upper quadrant pain showing possible hepatic steatosis and cholelithiasis but no obstructing stones.  Consulted GI who reported they will evaluate her but asked that we admit her overnight for observation.  Spoke with [facility] second attending who is agreeable to the admission. -  Admit to [facility] specialty care - A.m. CBC - Inflammatory labs ordered - GI consulted, appreciate their assistance in management of this patient     21 weeks of pregnancy Fetal monitoring  reassuring.  No pregnancy complaints at this time.  Happening to [facility] specialty care.   Nicole Mclean

## 2022-10-10 NOTE — MAU Note (Signed)
.  Nicole Mclean is a 27 y.o. at [redacted]w[redacted]d here in MAU reporting: large amount of rectal bleeding that started around 1630 yesterday evening. She states she sat on the toilet and it was full of blood. She was not straining to have a BM but does still have hemorrhoids from her last delivery. She states it even happens when she passes gas. Denies VB or LOF.   Pain score: 0 Vitals:   10/10/22 1411  BP: 139/76  Pulse: (!) 110  Resp: 14  Temp: 98.3 F (36.8 C)  SpO2: 100%     FHT:142 Lab orders placed from triage:  UA

## 2022-10-11 ENCOUNTER — Observation Stay (HOSPITAL_BASED_OUTPATIENT_CLINIC_OR_DEPARTMENT_OTHER): Payer: No Typology Code available for payment source | Admitting: Anesthesiology

## 2022-10-11 ENCOUNTER — Observation Stay (HOSPITAL_COMMUNITY): Payer: No Typology Code available for payment source | Admitting: Anesthesiology

## 2022-10-11 ENCOUNTER — Encounter (HOSPITAL_COMMUNITY)
Admission: AD | Disposition: A | Discharge status: Home / Self Care | Payer: Self-pay | Source: Home / Self Care | Attending: Obstetrics & Gynecology

## 2022-10-11 ENCOUNTER — Encounter (HOSPITAL_COMMUNITY): Payer: Self-pay | Admitting: Obstetrics & Gynecology

## 2022-10-11 DIAGNOSIS — K641 Second degree hemorrhoids: Secondary | ICD-10-CM

## 2022-10-11 DIAGNOSIS — K625 Hemorrhage of anus and rectum: Secondary | ICD-10-CM

## 2022-10-11 DIAGNOSIS — K921 Melena: Secondary | ICD-10-CM

## 2022-10-11 DIAGNOSIS — D649 Anemia, unspecified: Secondary | ICD-10-CM | POA: Diagnosis not present

## 2022-10-11 DIAGNOSIS — K649 Unspecified hemorrhoids: Secondary | ICD-10-CM

## 2022-10-11 DIAGNOSIS — D560 Alpha thalassemia: Secondary | ICD-10-CM

## 2022-10-11 DIAGNOSIS — Z3A21 21 weeks gestation of pregnancy: Secondary | ICD-10-CM | POA: Diagnosis not present

## 2022-10-11 DIAGNOSIS — Z6841 Body Mass Index (BMI) 40.0 and over, adult: Secondary | ICD-10-CM

## 2022-10-11 HISTORY — PX: FLEXIBLE SIGMOIDOSCOPY: SHX5431

## 2022-10-11 LAB — CBC
HCT: 31 % — ABNORMAL LOW (ref 36.0–46.0)
Hemoglobin: 10.7 g/dL — ABNORMAL LOW (ref 12.0–15.0)
MCH: 30.1 pg (ref 26.0–34.0)
MCHC: 34.5 g/dL (ref 30.0–36.0)
MCV: 87.3 fL (ref 80.0–100.0)
Platelets: 265 10*3/uL (ref 150–400)
RBC: 3.55 MIL/uL — ABNORMAL LOW (ref 3.87–5.11)
RDW: 13.1 % (ref 11.5–15.5)
WBC: 10.4 10*3/uL (ref 4.0–10.5)
nRBC: 0 % (ref 0.0–0.2)

## 2022-10-11 SURGERY — SIGMOIDOSCOPY, FLEXIBLE
Anesthesia: General

## 2022-10-11 MED ORDER — LACTATED RINGERS IV SOLN: INTRAVENOUS | Status: DC | PRN

## 2022-10-11 MED ORDER — LIDOCAINE 2% (20 MG/ML) 5 ML SYRINGE
INTRAMUSCULAR | Status: DC | PRN
  Administered 2022-10-11: 100 mg via INTRAVENOUS

## 2022-10-11 MED ORDER — MENTHOL 3 MG MT LOZG
1.0000 | LOZENGE | OROMUCOSAL | Status: DC | PRN
  Administered 2022-10-11: 3 mg via ORAL
  Filled 2022-10-11: qty 9

## 2022-10-11 MED ORDER — PROPOFOL 10 MG/ML IV BOLUS
INTRAVENOUS | Status: DC | PRN
  Administered 2022-10-11: 200 mg via INTRAVENOUS

## 2022-10-11 MED ORDER — SUCCINYLCHOLINE CHLORIDE 200 MG/10ML IV SOSY
PREFILLED_SYRINGE | INTRAVENOUS | Status: DC | PRN
  Administered 2022-10-11: 120 mg via INTRAVENOUS

## 2022-10-11 NOTE — Anesthesia Preprocedure Evaluation (Addendum)
Anesthesia Evaluation  Patient identified by MRN, date of birth, ID band Patient awake    Reviewed: Allergy & Precautions, NPO status , Patient's Chart, lab work & pertinent test results  History of Anesthesia Complications (+) history of anesthetic complications  Airway Mallampati: II  TM Distance: >3 FB Neck ROM: Full    Dental  (+) Dental Advisory Given,    Pulmonary neg pulmonary ROS   Pulmonary exam normal breath sounds clear to auscultation       Cardiovascular negative cardio ROS  Rhythm:Regular Rate:Normal     Neuro/Psych negative neurological ROS     GI/Hepatic negative GI ROS, Neg liver ROS,,,  Endo/Other  neg diabetes  Morbid obesity  Renal/GU negative Renal ROS     Musculoskeletal   Abdominal   Peds  Hematology  (+) Blood dyscrasia, anemia   Anesthesia Other Findings 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   Reproductive/Obstetrics                             Anesthesia Physical Anesthesia Plan  ASA: 3  Anesthesia Plan: General   Post-op Pain Management:    Induction: Intravenous and Rapid sequence  PONV Risk Score and Plan: 3 and Ondansetron and Treatment may vary due to age or medical condition  Airway Management Planned: Oral ETT  Additional Equipment:   Intra-op Plan:   Post-operative Plan: Extubation in OR  Informed Consent: I have reviewed the patients History and Physical, chart, labs and discussed the procedure including the risks, benefits and alternatives for the proposed anesthesia with the patient or authorized representative who has indicated his/her understanding and acceptance.     Dental advisory given  Plan Discussed with: CRNA and Anesthesiologist  Anesthesia Plan Comments: (Risks of general anesthesia discussed including, but not limited to, sore throat, hoarse voice, chipped/damaged teeth, injury  to vocal cords, nausea and vomiting, allergic reactions, lung infection, heart attack, stroke, and death. All questions answered. )       Anesthesia Quick Evaluation

## 2022-10-11 NOTE — Anesthesia Procedure Notes (Signed)
Procedure Name: Intubation Date/Time: 10/11/2022 3:41 PM  Performed by: Dorann Lodge, CRNAPre-anesthesia Checklist: Patient identified, Emergency Drugs available, Suction available and Patient being monitored Patient Re-evaluated:Patient Re-evaluated prior to induction Oxygen Delivery Method: Circle System Utilized Preoxygenation: Pre-oxygenation with 100% oxygen Induction Type: IV induction, Rapid sequence and Cricoid Pressure applied Laryngoscope Size: Mac and 4 Grade View: Grade I Tube type: Oral Tube size: 7.0 mm Number of attempts: 2 Airway Equipment and Method: Stylet Placement Confirmation: ETT inserted through vocal cords under direct vision, positive ETCO2 and breath sounds checked- equal and bilateral Secured at: 22 cm Tube secured with: Tape Dental Injury: Teeth and Oropharynx as per pre-operative assessment
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
## 2022-10-11 NOTE — Consult Note (Addendum)
Consultation  Referring Provider: GYN/ Roselie Awkward MD Primary Care Physician:  Patient, No Pcp Per Primary Gastroenterologist: unassigned  Reason for Consultation:  rectal bleeding  HPI: Nicole Mclean is a 27 y.o. female, generally in good health with prior history of STD, and alpha thalassemia who presented to the emergency room yesterday with complaints of acute onset of rectal bleeding.  Patient is 21 weeks / 5 days pregnant with her third child.  She has not had any complications with this pregnancy. She denies having any issues with abdominal pain, changes in bowel habits diarrhea etc.  She says she had onset of bleeding about 48 hours ago.  With the first episode she had an urge for bowel movement but passed just blood and some tiny clots in the commode and also noted on the tissue without stool.  She did not have any preceding abdominal pain, no rectal pain but did note some pressure.  Later that same day several hours later she had a urge for bowel movement and then passed which she believes was a normal-appearing stool but also noted bright red blood and some clots in the commode and on the tissue. Yesterday morning she had a third episode very similar.  She has not had any further episodes since.  And she has no complaints of rectal pain. On digital exam in the emergency room yesterday she was found to have nonbleeding external hemorrhoid, there is no blood on the exam glove with digital exam and no palpable internal hemorrhoids.  She does have pictures of the blood she noted in the commode. The remainder of the workup from an OB standpoint has been unremarkable. Labs yesterday showed hemoglobin of 11.6 with a baseline hemoglobin of 12.1. Hemoglobin this a.m. 10.7/hematocrit 31.0/WBC 10.4/platelets 265/MCV 87.3 Sed rate 29/CRP within normal limits   No Family history of GI disease that she is aware of other than IBS     Past Medical History:  Diagnosis Date   Chlamydia 01/2016    Gonorrhea April 2015   Gonorrhea    Infection    UTI   STD (female)    Syphilis    UTI (urinary tract infection)     Past Surgical History:  Procedure Laterality Date   chin surgery     HAND SURGERY     Left  hand   mirena      mirena iud removed   RHYTIDECTOMY NECK / CHEEK / CHIN      Prior to Admission medications   Medication Sig Start Date End Date Taking? Authorizing Provider  Blood Pressure Monitoring (BLOOD PRESSURE KIT) DEVI 1 kit by Does not apply route once a week. 07/15/22   Inez Catalina, MD  metroNIDAZOLE (FLAGYL) 500 MG tablet Take 1 tablet (500 mg total) by mouth 2 (two) times daily. Patient not taking: Reported on 09/22/2022 07/26/22   Shelly Bombard, MD  Prenat w/o A Vit-FeFum-FePo-FA (CONCEPT OB) 130-92.4-1 MG CAPS Take 1 capsule by mouth daily. Patient not taking: Reported on 09/22/2022 07/22/22   Shelly Bombard, MD  Prenatal Vit-Fe Fumarate-FA (PRENATAL VITAMIN PLUS LOW IRON) 27-1 MG TABS Take 1 tablet by mouth daily. 07/25/22   Constant, Peggy, MD  terconazole (TERAZOL 7) 0.4 % vaginal cream Place 1 applicator vaginally at bedtime. Patient not taking: Reported on 09/22/2022 08/25/22   Griffin Basil, MD    Current Facility-Administered Medications  Medication Dose Route Frequency Provider Last Rate Last Admin   acetaminophen (TYLENOL) tablet 650 mg  650 mg Oral Q4H PRN Woodroe Mode, MD        Allergies as of 10/10/2022   (No Known Allergies)    Family History  Adopted: Yes  Problem Relation Age of Onset   Lung disease Mother    Depression Mother    Heart disease Father    Diabetes Father    Hypertension Father    Learning disabilities Neg Hx    Mental illness Neg Hx    Mental retardation Neg Hx    Vision loss Neg Hx    Varicose Veins Neg Hx    Asthma Neg Hx     Social History   Socioeconomic History   Marital status: Single    Spouse name: Not on file   Number of children: Not on file   Years of education: Not on file   Highest  education level: Not on file  Occupational History   Not on file  Tobacco Use   Smoking status: Never    Passive exposure: Never   Smokeless tobacco: Never  Vaping Use   Vaping Use: Never used  Substance and Sexual Activity   Alcohol use: No    Alcohol/week: 0.0 standard drinks of alcohol   Drug use: No   Sexual activity: Yes    Partners: Male    Birth control/protection: None  Other Topics Concern   Not on file  Social History Narrative   Not on file   Social Determinants of Health   Financial Resource Strain: Not on file  Food Insecurity: Food Insecurity Present (10/10/2022)   Hunger Vital Sign    Worried About Running Out of Food in the Last Year: Sometimes true    Ran Out of Food in the Last Year: Sometimes true  Transportation Needs: No Transportation Needs (10/10/2022)   PRAPARE - Hydrologist (Medical): No    Lack of Transportation (Non-Medical): No  Physical Activity: Not on file  Stress: Not on file  Social Connections: Not on file  Intimate Partner Violence: Not At Risk (10/10/2022)   Humiliation, Afraid, Rape, and Kick questionnaire    Fear of Current or Ex-Partner: No    Emotionally Abused: No    Physically Abused: No    Sexually Abused: No    Review of Systems: Pertinent positive and negative review of systems were noted in the above HPI section.  All other review of systems was otherwise negative.  Physical Exam: Vital signs in last 24 hours: Temp:  [97.6 F (36.4 C)-98.3 F (36.8 C)] 98.2 F (36.8 C) (03/26 0807) Pulse Rate:  [90-110] 90 (03/26 0807) Resp:  [14-19] 18 (03/26 0807) BP: (101-139)/(57-76) 101/58 (03/26 0807) SpO2:  [98 %-100 %] 98 % (03/26 0807) Weight:  [114.9 kg] 114.9 kg (03/25 1407) Last BM Date : 10/10/22 General:   Alert,  Well-developed, well-nourished, young female, pleasant and cooperative in NAD Head:  Normocephalic and atraumatic. Eyes:  Sclera clear, no icterus.   Conjunctiva pink. Ears:   Normal auditory acuity. Nose:  No deformity, discharge,  or lesions. Mouth:  No deformity or lesions.   Neck:  Supple; no masses or thyromegaly. Lungs:  Clear throughout to auscultation.   No wheezes, crackles, or rhonchi.  Heart:  Regular rate and rhythm; no murmurs, clicks, rubs,  or gallops. Abdomen:  Soft, obese, uterus palpable just below the umbilicus BS active,nonpalp mass or hsm.   Rectal: Not done today Msk:  Symmetrical without gross deformities. . Pulses:  Normal pulses  noted. Extremities:  Without clubbing or edema. Neurologic:  Alert and  oriented x4;  grossly normal neurologically. Skin:  Intact without significant lesions or rashes.. Psych:  Alert and cooperative. Normal mood and affect.  Intake/Output from previous day: No intake/output data recorded. Intake/Output this shift: No intake/output data recorded.  Lab Results: Recent Labs    10/10/22 1449 10/11/22 0515  WBC 10.2 10.4  HGB 11.6* 10.7*  HCT 33.5* 31.0*  PLT 294 265   BMET Recent Labs    10/10/22 1449  NA 136  K 3.6  CL 104  CO2 25  GLUCOSE 98  BUN <5*  CREATININE 0.58  CALCIUM 9.0   LFT Recent Labs    10/10/22 1449  PROT 6.1*  ALBUMIN 2.9*  AST 17  ALT 25  ALKPHOS 74  BILITOT 0.4   PT/INR No results for input(s): "LABPROT", "INR" in the last 72 hours. Hepatitis Panel No results for input(s): "HEPBSAG", "HCVAB", "HEPAIGM", "HEPBIGM" in the last 72 hours.    IMPRESSION:   #29  27 year old female with 21-week intrauterine pregnancy, this is her third pregnancy, presenting with onset of rectal bleeding initially occurred about 48 hours ago.  With the initial episode she just passed bright red blood and small clots into the commode and on the tissue, no stool. Second similar episode later that day but occurring with a bowel movement, stool appeared brown.  1 episode yesterday morning again occurring with urge for bowel movement . By history this is most likely bleeding secondary to  internal hemorrhoids, however cannot rule out other anorectal pathology, proctitis, colitis, neoplasm.  #2 mild normocytic anemia-no significant drop at present #3 history of alpha thalassemia  Plan; keep n.p.o. We are hoping to get her scheduled late this afternoon for flexible sigmoidoscopy with Dr. Havery Moros with sedation.  I did discuss the option of doing an unsedated flexible sigmoidoscopy given she is pregnant but she does not feel that she would be able to do that. Tapwater enemas x 2 prior to flex Repeat hemoglobin tomorrow  Further recommendations pending results of endoscopic evaluation   Amy Esterwood PA-C 10/11/2022, 9:39 AM     Attending physician's note   I have taken history, reviewed the chart and examined the patient. I performed a substantive portion of this encounter, including complete performance of at least one of the key components, in conjunction with the APP. I agree with the Advanced Practitioner's note, impression and recommendations.   Reasonable to proceed with flex sig for eval of rectal bleeding (for now) Trend CBC.   Carmell Austria, MD [facility] [phone]

## 2022-10-11 NOTE — Op Note (Signed)
[facility] Patient Name: Nicole Mclean Procedure Date : 10/11/2022 MRN: [id number] Attending MD: Jackquline Denmark , MD, [id number] Date of Birth: 06-30-1996 CSN: [id number] Age: 27 Admit Type: Inpatient Procedure:                Flexible Sigmoidoscopy Indications:              Hematochezia in 21-week intrauterine pregnancy. Providers:                Jamison Neighbor RN, RN, Luan Moore, Technician,                            Laban Emperor, Lelon Frohlich, MD Referring MD:              Medicines:                General Anesthesia Complications:            No immediate complications. Estimated Blood Loss:     Estimated blood loss: none. Procedure:                Pre-Anesthesia Assessment:                           - Prior to the procedure, a History and Physical                            was performed, and patient medications and                            allergies were reviewed. The patient's tolerance of                            previous anesthesia was also reviewed. The risks                            and benefits of the procedure and the sedation                            options and risks were discussed with the patient.                            All questions were answered, and informed consent                            was obtained. Prior Anticoagulants: The patient has                            taken no anticoagulant or antiplatelet agents. ASA                            Grade Assessment: II - A patient with mild systemic                            disease. After reviewing the risks and benefits,  the patient was deemed in satisfactory condition to                            undergo the procedure.                           After obtaining informed consent, the scope was                            passed under direct vision. The [id number]                            [id number]) Olympus peds colonoscope was introduced                             through the anus and advanced to the the splenic                            flexure (60 cm, stopped due to retained stool). The                            flexible sigmoidoscopy was accomplished without                            difficulty. The patient tolerated the procedure                            well. The quality of the bowel preparation was good. Scope In: 3:45:28 PM Scope Out: 3:49:50 PM Scope Withdrawal Time: 0 hours 0 minutes 1 second  Total Procedure Duration: 0 hours 4 minutes 22 seconds  Findings:      Non-bleeding internal hemorrhoids were found during retroflexion and       during perianal exam. The hemorrhoids were moderate and Grade II       (internal hemorrhoids that prolapse but reduce spontaneously). Red wale       markings suggestive of recent bleed. No active bleeding.      The exam was otherwise without abnormality. Impression:               - Non-bleeding internal hemorrhoids.                           - The examination (FS) was otherwise normal to                            splenic flexure.                           - No specimens collected. Recommendation:           - Return patient to hospital ward for ongoing care.                           - Use Anusol HC suppositories 1 bid x 7 days, then  as needed.                           - If constipated, start Benefiber 1 tablespoon p.o.                            with 8 ounces of water.                           - The findings and recommendations were discussed                            with the patient.                           - Trend CBC.                           - Will sign off for now. Procedure Code(s):        --- Professional ---                           [id number], Sigmoidoscopy, flexible; diagnostic,                            including collection of specimen(s) by brushing or                            washing, when performed (separate procedure) Diagnosis  Code(s):        --- Professional ---                           K64.1, Second degree hemorrhoids                           K92.1, Melena (includes Hematochezia) CPT copyright 2022 [employer]. All rights reserved. The codes documented in this report are preliminary and upon coder review may  be revised to meet current compliance requirements. Jackquline Denmark, MD 10/11/2022 4:03:23 PM This report has been signed electronically. Number of Addenda: 0

## 2022-10-11 NOTE — Transfer of Care (Signed)
Immediate Anesthesia Transfer of Care Note  Patient: Nicole Mclean  Procedure(s) Performed: FLEXIBLE SIGMOIDOSCOPY  Patient Location: Endoscopy Unit  Anesthesia Type:General  Level of Consciousness: awake, alert , and oriented  Airway & Oxygen Therapy: Patient Spontanous Breathing  Post-op Assessment: Report given to RN and Post -op Vital signs reviewed and stable  Post vital signs: Reviewed and stable  Last Vitals:  Vitals Value Taken Time  BP 126/98 10/11/22 1601  Temp    Pulse 110 10/11/22 1601  Resp 16 10/11/22 1601  SpO2 97 % 10/11/22 1601    Last Pain:  Vitals:   10/11/22 1601  TempSrc:   PainSc: 0-No pain         Complications: No notable events documented.

## 2022-10-12 DIAGNOSIS — Z3A21 21 weeks gestation of pregnancy: Secondary | ICD-10-CM | POA: Diagnosis not present

## 2022-10-12 DIAGNOSIS — K625 Hemorrhage of anus and rectum: Secondary | ICD-10-CM | POA: Diagnosis not present

## 2022-10-12 LAB — LACTOFERRIN, FECAL, QUALITATIVE: Lactoferrin, Fecal, Qual: NEGATIVE

## 2022-10-12 MED ORDER — HYDROCORTISONE ACETATE 25 MG RE SUPP: 25.0000 mg | Freq: Two times a day (BID) | RECTAL | 1 refills | Status: DC

## 2022-10-12 NOTE — Discharge Summary (Signed)
Patient ID: Nicole Mclean MRN: [id number] DOB/AGE: 1995-11-24 27 y.o.  Admit date: 10/10/2022 Discharge date: 10/12/2022  Admission Diagnoses:rectal bleeding  Discharge Diagnoses: internal hemorrhoids  Prenatal Procedures: flexible sigmoidoscopy by GI  Consults: Neonatology, Maternal Fetal Medicine  Hospital Course:  This is a 27 y.o. [id number] with IUP at [redacted]w[redacted]d admitted for rectal bleeding. Reports that the bleeding started yesterday afternoon. She states that there was enough water return to the toilet water completely red and she has a picture of this. Reports noticing every time she feels pressure in her rectum she goes to have a bowel movement and it is a clot. Was hoping it would stop but it continued throughout the night and into this morning so she came to the emergency department. Patient denies any history of rectal bleeding in the past. Patient does report that she has had hemorrhoids since she was very young but has never had issues with them bleeding. Is unsure of her family history because she is adopted so does not know if there is any history of GI issues in her family. Denies any symptoms of anemia. Reports some right upper quadrant pain. Denies any rectal trauma.  GI was consulted and performed a flexible sigmoidoscopy that showed internal hemorrhoids. There was no more bleeding during admission.  She was deemed stable for discharge to home with outpatient follow up.  Discharge Exam: Temp:  [97.4 F (36.3 C)-98.4 F (36.9 C)] 98.1 F (36.7 C) (03/27 0819) Pulse Rate:  [79-110] 87 (03/27 0819) Resp:  [15-21] 16 (03/27 0819) BP: (102-126)/(54-98) 102/56 (03/27 0819) SpO2:  [97 %-100 %] 100 % (03/27 0819) Weight:  [114.9 kg] 114.9 kg (03/26 1450) Physical Examination: CONSTITUTIONAL: Well-developed, well-nourished female in no acute distress.  HENT:  Normocephalic, atraumatic, External right and left ear normal. Oropharynx is clear and moist EYES: Conjunctivae and EOM are  normal. Pupils are equal, round, and reactive to light. No scleral icterus.  NECK: Normal range of motion, supple, no masses SKIN: Skin is warm and dry. No rash noted. Not diaphoretic. No erythema. No pallor. NEUROLGIC: Alert and oriented to person, place, and time. Normal reflexes, muscle tone coordination. No cranial nerve deficit noted. PSYCHIATRIC: Normal mood and affect. Normal behavior. Normal judgment and thought content. CARDIOVASCULAR: Normal heart rate noted, regular rhythm RESPIRATORY: Effort and breath sounds normal, no problems with respiration noted MUSCULOSKELETAL: Normal range of motion. No edema and no tenderness. 2+ distal pulses. ABDOMEN: Soft, nontender, nondistended, gravid. CERVIX:    Fetal monitoring: FHR: 145 bpm, Variability: moderate, Accelerations: Present, Decelerations: Absent  Uterine activity: 0 contractions per hour  Significant Diagnostic Studies:  Results for orders placed or performed during the hospital encounter of 10/10/22 (from the past 168 hour(s))  Wet prep, genital   Collection Time: 10/10/22  2:35 PM   Specimen: Urine, Clean Catch  Result Value Ref Range   Yeast Wet Prep HPF POC NONE SEEN NONE SEEN   Trich, Wet Prep NONE SEEN NONE SEEN   Clue Cells Wet Prep HPF POC PRESENT (A) NONE SEEN   WBC, Wet Prep HPF POC >=10 (A) <10   Sperm NONE SEEN   Urinalysis, Routine w reflex microscopic -Urine, Clean Catch   Collection Time: 10/10/22  2:43 PM  Result Value Ref Range   Color, Urine YELLOW YELLOW   APPearance HAZY (A) CLEAR   Specific Gravity, Urine 1.015 1.005 - 1.030   pH 6.0 5.0 - 8.0   Glucose, UA NEGATIVE NEGATIVE mg/dL   Hgb urine dipstick NEGATIVE  NEGATIVE   Bilirubin Urine NEGATIVE NEGATIVE   Ketones, ur NEGATIVE NEGATIVE mg/dL   Protein, ur NEGATIVE NEGATIVE mg/dL   Nitrite NEGATIVE NEGATIVE   Leukocytes,Ua NEGATIVE NEGATIVE  CBC   Collection Time: 10/10/22  2:49 PM  Result Value Ref Range   WBC 10.2 4.0 - 10.5 K/uL   RBC 3.89  3.87 - 5.11 MIL/uL   Hemoglobin 11.6 (L) 12.0 - 15.0 g/dL   HCT 65.7 (L) 84.6 - 96.2 %   MCV 86.1 80.0 - 100.0 fL   MCH 29.8 26.0 - 34.0 pg   MCHC 34.6 30.0 - 36.0 g/dL   RDW 95.2 84.1 - 32.4 %   Platelets 294 150 - 400 K/uL   nRBC 0.0 0.0 - 0.2 %  Comprehensive metabolic panel   Collection Time: 10/10/22  2:49 PM  Result Value Ref Range   Sodium 136 135 - 145 mmol/L   Potassium 3.6 3.5 - 5.1 mmol/L   Chloride 104 98 - 111 mmol/L   CO2 25 22 - 32 mmol/L   Glucose, Bld 98 70 - 99 mg/dL   BUN <5 (L) 6 - 20 mg/dL   Creatinine, Ser 4.01 0.44 - 1.00 mg/dL   Calcium 9.0 8.9 - 02.7 mg/dL   Total Protein 6.1 (L) 6.5 - 8.1 g/dL   Albumin 2.9 (L) 3.5 - 5.0 g/dL   AST 17 15 - 41 U/L   ALT 25 0 - 44 U/L   Alkaline Phosphatase 74 38 - 126 U/L   Total Bilirubin 0.4 0.3 - 1.2 mg/dL   GFR, Estimated >25 >36 mL/min   Anion gap 7 5 - 15  C-reactive protein   Collection Time: 10/10/22  2:49 PM  Result Value Ref Range   CRP 0.7 <1.0 mg/dL  Sedimentation rate   Collection Time: 10/10/22  2:49 PM  Result Value Ref Range   Sed Rate 29 (H) 0 - 22 mm/hr  Type and screen [facility]   Collection Time: 10/10/22  5:37 PM  Result Value Ref Range   ABO/RH(D) O POS    Antibody Screen NEG    Sample Expiration      10/13/2022,2359 Performed at [facility], [address], [place], [place] [address]   CBC   Collection Time: 10/11/22  5:15 AM  Result Value Ref Range   WBC 10.4 4.0 - 10.5 K/uL   RBC 3.55 (L) 3.87 - 5.11 MIL/uL   Hemoglobin 10.7 (L) 12.0 - 15.0 g/dL   HCT 47.4 (L) 25.9 - 56.3 %   MCV 87.3 80.0 - 100.0 fL   MCH 30.1 26.0 - 34.0 pg   MCHC 34.5 30.0 - 36.0 g/dL   RDW 87.5 64.3 - 32.9 %   Platelets 265 150 - 400 K/uL   nRBC 0.0 0.0 - 0.2 %  Lactoferrin, Fecal, Qualitative   Collection Time: 10/11/22  1:30 PM  Result Value Ref Range   Lactoferrin, Fecal, Qual NEGATIVE NEGATIVE    Discharge Condition: Stable  Disposition: Discharge disposition: 01-Home  or Self Care        Discharge Instructions     Discharge patient   Complete by: As directed    Discharge to home as per PACU criteria   Discharge disposition: 01-Home or Self Care   Discharge patient date: 10/12/2022      Allergies as of 10/12/2022   No Known Allergies      Medication List     STOP taking these medications  metroNIDAZOLE 500 MG tablet Commonly known as: FLAGYL   terconazole 0.4 % vaginal cream Commonly known as: TERAZOL 7       TAKE these medications    Blood Pressure Kit Devi 1 kit by Does not apply route once a week.   Concept OB 130-92.4-1 MG Caps Take 1 capsule by mouth daily.   hydrocortisone 25 MG suppository Commonly known as: ANUSOL-HC Place 1 suppository (25 mg total) rectally 2 (two) times daily.   Prenatal Vitamin Plus Low Iron 27-1 MG Tabs Take 1 tablet by mouth daily.        Follow-up Information     [facility] for [facility] Follow up in 1 week(s).   Specialty: Obstetrics and Gynecology Contact information: [address] [place] [address] [phone]                Signed: Scheryl Darter M.D. 10/12/2022, 9:39 AM

## 2022-10-12 NOTE — Anesthesia Postprocedure Evaluation (Signed)
Anesthesia Post Note  Patient: Nicole Mclean  Procedure(s) Performed: [place]     Patient location during evaluation: PACU Anesthesia Type: General Level of consciousness: awake Pain management: pain level controlled Vital Signs Assessment: post-procedure vital signs reviewed and stable Respiratory status: spontaneous breathing, nonlabored ventilation and respiratory function stable Cardiovascular status: blood pressure returned to baseline and stable Postop Assessment: no apparent nausea or vomiting Anesthetic complications: no   No notable events documented.  Last Vitals:  Vitals:   10/12/22 0338 10/12/22 0819  BP: (!) 120/55 (!) 102/56  Pulse: 93 87  Resp: 15 16  Temp: 36.9 C 36.7 C  SpO2: 99% 100%    Last Pain:  Vitals:   10/12/22 0830  TempSrc:   PainSc: 0-No pain                 Nilda Simmer

## 2022-10-12 NOTE — Plan of Care (Signed)

## 2022-10-16 ENCOUNTER — Encounter (HOSPITAL_COMMUNITY): Payer: Self-pay | Admitting: Gastroenterology

## 2022-10-20 ENCOUNTER — Encounter: Payer: No Typology Code available for payment source | Admitting: Obstetrics & Gynecology

## 2022-10-26 ENCOUNTER — Ambulatory Visit: Payer: No Typology Code available for payment source | Attending: Maternal & Fetal Medicine

## 2022-10-26 ENCOUNTER — Ambulatory Visit: Payer: No Typology Code available for payment source

## 2022-10-26 VITALS — BP 123/58 | HR 88

## 2022-10-26 DIAGNOSIS — Z3480 Encounter for supervision of other normal pregnancy, unspecified trimester: Secondary | ICD-10-CM

## 2022-10-26 DIAGNOSIS — O99212 Obesity complicating pregnancy, second trimester: Secondary | ICD-10-CM

## 2022-10-26 DIAGNOSIS — D563 Thalassemia minor: Secondary | ICD-10-CM

## 2022-10-26 DIAGNOSIS — O30002 Twin pregnancy, unspecified number of placenta and unspecified number of amniotic sacs, second trimester: Secondary | ICD-10-CM | POA: Insufficient documentation

## 2022-10-26 DIAGNOSIS — O98513 Other viral diseases complicating pregnancy, third trimester: Secondary | ICD-10-CM | POA: Diagnosis not present

## 2022-10-26 DIAGNOSIS — O0289 Other abnormal products of conception: Secondary | ICD-10-CM

## 2022-10-26 DIAGNOSIS — B009 Herpesviral infection, unspecified: Secondary | ICD-10-CM

## 2022-10-26 DIAGNOSIS — O285 Abnormal chromosomal and genetic finding on antenatal screening of mother: Secondary | ICD-10-CM

## 2022-10-26 DIAGNOSIS — O98512 Other viral diseases complicating pregnancy, second trimester: Secondary | ICD-10-CM

## 2022-10-26 DIAGNOSIS — Z3A23 23 weeks gestation of pregnancy: Secondary | ICD-10-CM

## 2022-10-26 DIAGNOSIS — E669 Obesity, unspecified: Secondary | ICD-10-CM

## 2022-10-27 ENCOUNTER — Ambulatory Visit: Payer: No Typology Code available for payment source

## 2022-10-31 ENCOUNTER — Encounter: Payer: Self-pay | Admitting: Obstetrics and Gynecology

## 2022-10-31 ENCOUNTER — Other Ambulatory Visit: Payer: Self-pay | Admitting: *Deleted

## 2022-10-31 ENCOUNTER — Ambulatory Visit: Payer: No Typology Code available for payment source | Admitting: Obstetrics and Gynecology

## 2022-10-31 VITALS — BP 135/76 | HR 104 | Wt 258.0 lb

## 2022-10-31 DIAGNOSIS — Z3A24 24 weeks gestation of pregnancy: Secondary | ICD-10-CM

## 2022-10-31 DIAGNOSIS — O99212 Obesity complicating pregnancy, second trimester: Secondary | ICD-10-CM

## 2022-10-31 DIAGNOSIS — Z3482 Encounter for supervision of other normal pregnancy, second trimester: Secondary | ICD-10-CM

## 2022-10-31 DIAGNOSIS — Z3480 Encounter for supervision of other normal pregnancy, unspecified trimester: Secondary | ICD-10-CM

## 2022-10-31 DIAGNOSIS — K625 Hemorrhage of anus and rectum: Secondary | ICD-10-CM
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
## 2022-10-31 NOTE — Progress Notes (Addendum)
Pt presents for ROB reports R hip pain radiating to leg.

## 2022-11-03 ENCOUNTER — Other Ambulatory Visit: Payer: Self-pay | Admitting: *Deleted

## 2022-11-03 DIAGNOSIS — O99212 Obesity complicating pregnancy, second trimester: Secondary | ICD-10-CM

## 2022-11-28 ENCOUNTER — Other Ambulatory Visit: Payer: Self-pay | Admitting: *Deleted

## 2022-11-28 ENCOUNTER — Ambulatory Visit: Payer: No Typology Code available for payment source | Admitting: *Deleted

## 2022-11-28 ENCOUNTER — Ambulatory Visit: Payer: No Typology Code available for payment source | Attending: Maternal & Fetal Medicine

## 2022-11-28 VITALS — BP 128/75 | HR 102

## 2022-11-28 DIAGNOSIS — B009 Herpesviral infection, unspecified: Secondary | ICD-10-CM

## 2022-11-28 DIAGNOSIS — O3113X0 Continuing pregnancy after spontaneous abortion of one fetus or more, third trimester, not applicable or unspecified: Secondary | ICD-10-CM

## 2022-11-28 DIAGNOSIS — O99213 Obesity complicating pregnancy, third trimester: Secondary | ICD-10-CM | POA: Diagnosis present

## 2022-11-28 DIAGNOSIS — O99212 Obesity complicating pregnancy, second trimester: Secondary | ICD-10-CM | POA: Insufficient documentation

## 2022-11-28 DIAGNOSIS — O285 Abnormal chromosomal and genetic finding on antenatal screening of mother: Secondary | ICD-10-CM

## 2022-11-28 DIAGNOSIS — E669 Obesity, unspecified: Secondary | ICD-10-CM

## 2022-11-28 DIAGNOSIS — Z3A28 28 weeks gestation of pregnancy: Secondary | ICD-10-CM

## 2022-11-28 DIAGNOSIS — O98513 Other viral diseases complicating pregnancy, third trimester: Secondary | ICD-10-CM

## 2022-11-28 DIAGNOSIS — D563 Thalassemia minor: Secondary | ICD-10-CM

## 2022-11-29 ENCOUNTER — Other Ambulatory Visit: Payer: No Typology Code available for payment source

## 2022-11-29 ENCOUNTER — Ambulatory Visit (INDEPENDENT_AMBULATORY_CARE_PROVIDER_SITE_OTHER): Payer: No Typology Code available for payment source | Admitting: Obstetrics & Gynecology

## 2022-11-29 VITALS — BP 112/62 | HR 102 | Wt 261.0 lb

## 2022-11-29 DIAGNOSIS — O99353 Diseases of the nervous system complicating pregnancy, third trimester: Secondary | ICD-10-CM

## 2022-11-29 DIAGNOSIS — O0993 Supervision of high risk pregnancy, unspecified, third trimester: Secondary | ICD-10-CM

## 2022-11-29 DIAGNOSIS — Z23 Encounter for immunization: Secondary | ICD-10-CM

## 2022-11-29 DIAGNOSIS — Z3A28 28 weeks gestation of pregnancy: Secondary | ICD-10-CM

## 2022-11-29 DIAGNOSIS — G5600 Carpal tunnel syndrome, unspecified upper limb: Secondary | ICD-10-CM

## 2022-11-29 DIAGNOSIS — O99213 Obesity complicating pregnancy, third trimester: Secondary | ICD-10-CM

## 2022-11-29 MED ORDER — ASPIRIN 81 MG PO TBEC: 81.0000 mg | DELAYED_RELEASE_TABLET | Freq: Every day | ORAL | 2 refills | Status: DC

## 2022-11-29 NOTE — Progress Notes (Signed)
Pt is having carpal tunnel symptoms and increase in pelvic pressure. Pt has bump on pubic area she would like checked today.
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
## 2022-11-30 LAB — RPR: RPR Ser Ql: NONREACTIVE

## 2022-11-30 LAB — CBC
Hematocrit: 32.8 % — ABNORMAL LOW (ref 34.0–46.6)
Hemoglobin: 11 g/dL — ABNORMAL LOW (ref 11.1–15.9)
MCH: 28.9 pg (ref 26.6–33.0)
MCHC: 33.5 g/dL (ref 31.5–35.7)
MCV: 86 fL (ref 79–97)
Platelets: 297 10*3/uL (ref 150–450)
RBC: 3.81 x10E6/uL (ref 3.77–5.28)
RDW: 12.7 % (ref 11.7–15.4)
WBC: 9.6 10*3/uL (ref 3.4–10.8)

## 2022-11-30 LAB — GLUCOSE TOLERANCE, 2 HOURS W/ 1HR
Glucose, 1 hour: 145 mg/dL (ref 70–179)
Glucose, 2 hour: 107 mg/dL (ref 70–152)
Glucose, Fasting: 85 mg/dL (ref 70–91)

## 2022-11-30 LAB — HIV ANTIBODY (ROUTINE TESTING W REFLEX): HIV Screen 4th Generation wRfx: NONREACTIVE

## 2022-12-10 ENCOUNTER — Encounter: Payer: Self-pay | Admitting: Obstetrics

## 2022-12-13 ENCOUNTER — Telehealth: Payer: Self-pay

## 2022-12-13 ENCOUNTER — Inpatient Hospital Stay (HOSPITAL_COMMUNITY)
Admission: AD | Admit: 2022-12-13 | Discharge: 2022-12-13 | Disposition: A | Discharge status: Home / Self Care | Payer: No Typology Code available for payment source | Attending: Family Medicine | Admitting: Family Medicine

## 2022-12-13 DIAGNOSIS — M25471 Effusion, right ankle: Secondary | ICD-10-CM | POA: Insufficient documentation

## 2022-12-13 DIAGNOSIS — R519 Headache, unspecified: Secondary | ICD-10-CM | POA: Diagnosis not present

## 2022-12-13 DIAGNOSIS — M25472 Effusion, left ankle: Secondary | ICD-10-CM | POA: Diagnosis not present

## 2022-12-13 DIAGNOSIS — O0993 Supervision of high risk pregnancy, unspecified, third trimester: Secondary | ICD-10-CM

## 2022-12-13 DIAGNOSIS — O26893 Other specified pregnancy related conditions, third trimester: Secondary | ICD-10-CM | POA: Insufficient documentation

## 2022-12-13 DIAGNOSIS — R1011 Right upper quadrant pain: Secondary | ICD-10-CM | POA: Insufficient documentation

## 2022-12-13 DIAGNOSIS — Z3A30 30 weeks gestation of pregnancy: Secondary | ICD-10-CM | POA: Diagnosis not present

## 2022-12-13 DIAGNOSIS — G4489 Other headache syndrome: Secondary | ICD-10-CM

## 2022-12-13 MED ORDER — ACETAMINOPHEN 500 MG PO TABS: 1000.0000 mg | ORAL_TABLET | Freq: Four times a day (QID) | ORAL | 0 refills | Status: DC | PRN

## 2022-12-13 NOTE — MAU Provider Note (Addendum)
S [name] is a 27 y.o. [phone] female who presents to MAU today with complaint of swelling in left foot> right, HA for 3 days. She called the office and they recommended checking her BP.  She does not have a BP cuff at home.   She reports her left foot is painful She reports a HA for 3 days and had tired normal strength tylenol 1 tab once a day without relief.  Denies vision changes and RUQ pain.   ROS: Review of Systems  Constitutional:  Negative for chills and fever.  Eyes:  Negative for blurred vision and double vision.  Respiratory:  Negative for cough and shortness of breath.   Cardiovascular:  Positive for leg swelling (bilateral feet). Negative for chest pain and orthopnea.  Gastrointestinal:  Negative for nausea and vomiting.  Genitourinary:  Negative for dysuria, flank pain and frequency.  Musculoskeletal:  Negative for myalgias.  Skin:  Negative for rash.  Neurological:  Positive for headaches. Negative for dizziness, tingling and weakness.  Endo/Heme/Allergies:  Does not bruise/bleed easily.  Psychiatric/Behavioral:  Negative for depression and suicidal ideas. The patient is not nervous/anxious.     O BP 104/75   Pulse 94   Temp 98 F (36.7 C)   Resp 18   Ht 5\' 1"  (1.549 m)   Wt 121.6 kg   LMP 05/06/2022 (Exact Date)   BMI 50.64 kg/m  Physical Exam Vitals and nursing note reviewed.  Constitutional:      General: She is not in acute distress.    Appearance: She is well-developed.     Comments: Pregnant female  HENT:     Head: Normocephalic and atraumatic.  Eyes:     General: No scleral icterus.    Conjunctiva/sclera: Conjunctivae normal.  Cardiovascular:     Rate and Rhythm: Normal rate.     Pulses: Normal pulses.  Pulmonary:     Effort: Pulmonary effort is normal.  Chest:     Chest wall: No tenderness.  Abdominal:     Palpations: Abdomen is soft.     Tenderness: There is no abdominal tenderness. There is no guarding or rebound.     Comments:  Gravid  Genitourinary:    Vagina: Normal.  Musculoskeletal:        General: Swelling (bileral ankle swelling. Non pitting) present. Normal range of motion.     Cervical back: Normal range of motion and neck supple.  Skin:    General: Skin is warm and dry.     Findings: No rash.  Neurological:     Mental Status: She is alert and oriented to person, place, and time.     MDM: low-- due to patient's desire to leave.   A Normotensive   P Patient mostly here to have BP checked since she does not have a cuff at home.   Recommended labs and ER evaluation -- I discussed that I cannot rule out preeclampsia or DVT without appropriate evaluation.   Patient had told the nurse that she cannot stay.   Discussed we are happy to see her for for evaluation.   Patient did not have any fetal assessment but reported normal fetal movement, no contractions, no LOF  I offered labs drawn today to r/o atypical PEC-- patient declined blood draw today.   The patient reports she is unable to stay and "will just wait and talk to her doctors tomorrow at her appointment."  She reported a HA and I recommended trial of 1000mg  Acetaminophen.  Discussed with patient returning for continued HA, RUQ pain, worsening pain.   AGAIN- patient declined staying for full exam and labs. She was seen in triage due to her inability to stay and RN concern for her complaint.   Future Appointments  Date Time Provider Department Center  12/14/2022  1:30 PM Adam Phenix, MD [facility] None  12/26/2022  9:15 AM [facility] NURSE [facility]  12/26/2022  9:30 AM [facility] US3 [facility]  12/28/2022  9:55 AM Mercado-Ortiz, Lahoma Crocker, DO [facility] None  01/02/2023 10:30 AM [facility] NURSE [facility]  01/02/2023 10:45 AM [facility] US4 [facility]  01/09/2023  9:30 AM [facility] NURSE [facility]  01/09/2023  9:45 AM [facility] NST [facility]  01/16/2023  9:30 AM [facility] NURSE [facility]  01/16/2023  9:45 AM [facility] NST [facility]  01/23/2023   9:15 AM [facility] NURSE [facility]  01/23/2023  9:30 AM [facility] US3 [facility]     Federico Flake, MD 12/13/2022 4:21 PM

## 2022-12-13 NOTE — Telephone Encounter (Signed)
Good evening,  I am reaching out to see if I need to go to the hospital or not. I have had swelling in my left foot that recently developed over the last 2 days and as of the last 30 minutes my foot has started having a burning sensation as well. Please reach out at your earliest convenience.   Returned patient call to further discuss. Patient states that she only has swelling in her left foot with occasional tingling sensation. Patient denies having any injury to that foot. Unable to check BP at this time due to patient never picking up BP cuff.  Discussed sx with Dr. Donavan Foil. Patient okay to wait until routine ob appt tomorrow.  Patient advised to go to MAU if sx worsen before tomorrow.  Educated patient on the importance of picking up prescribed BP cuff and BP monitoring at home when uncertain situations arise. Patient verbalized understanding.

## 2022-12-13 NOTE — MAU Note (Addendum)
.  Nicole Mclean is a 27 y.o. at [redacted]w[redacted]d here in MAU reporting: swelling in her left foot has gotten worse for the past 3 days and it is painful. Stated she has had headache off and on for over a week that she  has taken tylenol with releif but it comes back . Stted she ahs felt like she was gong to pass out at times but has not.  Onset of complaint: 3 days Pain score: 6 Vitals:   12/13/22 1608  BP: 124/75  Pulse: 94  Resp: 18  Temp: 98 F (36.7 C)     FHT:148 Lab orders placed from triage:

## 2022-12-13 NOTE — MAU Note (Signed)
Dr. Alvester Morin MSE pt and discharged home . Keep appointment tomorrow with Ob.

## 2022-12-14 ENCOUNTER — Telehealth: Payer: No Typology Code available for payment source | Admitting: Obstetrics & Gynecology

## 2022-12-14 DIAGNOSIS — O0993 Supervision of high risk pregnancy, unspecified, third trimester: Secondary | ICD-10-CM

## 2022-12-14 NOTE — Progress Notes (Signed)
TC to patient, patient rescheduled

## 2022-12-26 ENCOUNTER — Ambulatory Visit: Payer: No Typology Code available for payment source

## 2022-12-28 ENCOUNTER — Encounter (HOSPITAL_COMMUNITY): Payer: Self-pay | Admitting: Obstetrics and Gynecology

## 2022-12-28 ENCOUNTER — Ambulatory Visit (INDEPENDENT_AMBULATORY_CARE_PROVIDER_SITE_OTHER): Payer: No Typology Code available for payment source | Admitting: Family Medicine

## 2022-12-28 ENCOUNTER — Inpatient Hospital Stay (HOSPITAL_COMMUNITY)
Admission: AD | Admit: 2022-12-28 | Discharge: 2022-12-28 | Disposition: A | Discharge status: Home / Self Care | Payer: No Typology Code available for payment source | Attending: Obstetrics and Gynecology | Admitting: Obstetrics and Gynecology

## 2022-12-28 VITALS — BP 136/88 | HR 99 | Wt 272.2 lb

## 2022-12-28 DIAGNOSIS — O1203 Gestational edema, third trimester: Secondary | ICD-10-CM

## 2022-12-28 DIAGNOSIS — R21 Rash and other nonspecific skin eruption: Secondary | ICD-10-CM | POA: Insufficient documentation

## 2022-12-28 DIAGNOSIS — R109 Unspecified abdominal pain: Secondary | ICD-10-CM | POA: Insufficient documentation

## 2022-12-28 DIAGNOSIS — D508 Other iron deficiency anemias: Secondary | ICD-10-CM | POA: Insufficient documentation

## 2022-12-28 DIAGNOSIS — O26893 Other specified pregnancy related conditions, third trimester: Secondary | ICD-10-CM | POA: Diagnosis not present

## 2022-12-28 DIAGNOSIS — G5600 Carpal tunnel syndrome, unspecified upper limb: Secondary | ICD-10-CM

## 2022-12-28 DIAGNOSIS — R519 Headache, unspecified: Secondary | ICD-10-CM | POA: Insufficient documentation

## 2022-12-28 DIAGNOSIS — Z3A32 32 weeks gestation of pregnancy: Secondary | ICD-10-CM | POA: Diagnosis not present

## 2022-12-28 DIAGNOSIS — M25471 Effusion, right ankle: Secondary | ICD-10-CM | POA: Diagnosis not present

## 2022-12-28 DIAGNOSIS — K625 Hemorrhage of anus and rectum: Secondary | ICD-10-CM | POA: Insufficient documentation

## 2022-12-28 DIAGNOSIS — H539 Unspecified visual disturbance: Secondary | ICD-10-CM | POA: Diagnosis not present

## 2022-12-28 DIAGNOSIS — O0993 Supervision of high risk pregnancy, unspecified, third trimester: Secondary | ICD-10-CM

## 2022-12-28 DIAGNOSIS — O99891 Other specified diseases and conditions complicating pregnancy: Secondary | ICD-10-CM | POA: Diagnosis not present

## 2022-12-28 DIAGNOSIS — O99213 Obesity complicating pregnancy, third trimester: Secondary | ICD-10-CM | POA: Insufficient documentation

## 2022-12-28 DIAGNOSIS — R42 Dizziness and giddiness: Secondary | ICD-10-CM | POA: Insufficient documentation

## 2022-12-28 DIAGNOSIS — M25472 Effusion, left ankle: Secondary | ICD-10-CM | POA: Insufficient documentation

## 2022-12-28 DIAGNOSIS — M5430 Sciatica, unspecified side: Secondary | ICD-10-CM

## 2022-12-28 DIAGNOSIS — O99353 Diseases of the nervous system complicating pregnancy, third trimester: Secondary | ICD-10-CM

## 2022-12-28 DIAGNOSIS — O99210 Obesity complicating pregnancy, unspecified trimester: Secondary | ICD-10-CM

## 2022-12-28 LAB — CBC WITH DIFFERENTIAL/PLATELET
Abs Immature Granulocytes: 0.05 10*3/uL (ref 0.00–0.07)
Basophils Absolute: 0 10*3/uL (ref 0.0–0.1)
Basophils Relative: 0 %
Eosinophils Absolute: 0.1 10*3/uL (ref 0.0–0.5)
Eosinophils Relative: 1 %
HCT: 32.6 % — ABNORMAL LOW (ref 36.0–46.0)
Hemoglobin: 10.6 g/dL — ABNORMAL LOW (ref 12.0–15.0)
Immature Granulocytes: 1 %
Lymphocytes Relative: 20 %
Lymphs Abs: 1.9 10*3/uL (ref 0.7–4.0)
MCH: 28.5 pg (ref 26.0–34.0)
MCHC: 32.5 g/dL (ref 30.0–36.0)
MCV: 87.6 fL (ref 80.0–100.0)
Monocytes Absolute: 0.6 10*3/uL (ref 0.1–1.0)
Monocytes Relative: 6 %
Neutro Abs: 7.1 10*3/uL (ref 1.7–7.7)
Neutrophils Relative %: 72 %
Platelets: 268 10*3/uL (ref 150–400)
RBC: 3.72 MIL/uL — ABNORMAL LOW (ref 3.87–5.11)
RDW: 13.3 % (ref 11.5–15.5)
WBC: 9.8 10*3/uL (ref 4.0–10.5)
nRBC: 0 % (ref 0.0–0.2)

## 2022-12-28 LAB — COMPREHENSIVE METABOLIC PANEL
ALT: 11 U/L (ref 0–44)
AST: 13 U/L — ABNORMAL LOW (ref 15–41)
Albumin: 2.6 g/dL — ABNORMAL LOW (ref 3.5–5.0)
Alkaline Phosphatase: 110 U/L (ref 38–126)
Anion gap: 11 (ref 5–15)
BUN: 5 mg/dL — ABNORMAL LOW (ref 6–20)
CO2: 21 mmol/L — ABNORMAL LOW (ref 22–32)
Calcium: 8.8 mg/dL — ABNORMAL LOW (ref 8.9–10.3)
Chloride: 103 mmol/L (ref 98–111)
Creatinine, Ser: 0.49 mg/dL (ref 0.44–1.00)
GFR, Estimated: >60 mL/min (ref >60)
Glucose, Bld: 96 mg/dL (ref 70–99)
Potassium: 3.6 mmol/L (ref 3.5–5.1)
Sodium: 135 mmol/L (ref 135–145)
Total Bilirubin: 0.2 mg/dL — ABNORMAL LOW (ref 0.3–1.2)
Total Protein: 5.7 g/dL — ABNORMAL LOW (ref 6.5–8.1)

## 2022-12-28 LAB — URINALYSIS, ROUTINE W REFLEX MICROSCOPIC
Bilirubin Urine: NEGATIVE
Glucose, UA: NEGATIVE mg/dL
Hgb urine dipstick: NEGATIVE
Ketones, ur: NEGATIVE mg/dL
Nitrite: NEGATIVE
Protein, ur: NEGATIVE mg/dL
Specific Gravity, Urine: 1.026 (ref 1.005–1.030)
pH: 5 (ref 5.0–8.0)

## 2022-12-28 LAB — PROTIME-INR
INR: 1.1 (ref 0.8–1.2)
Prothrombin Time: 14.3 seconds (ref 11.4–15.2)

## 2022-12-28 LAB — PROTEIN / CREATININE RATIO, URINE
Creatinine, Urine: 207 mg/dL
Protein Creatinine Ratio: 0.07 mg/mg{Cre} (ref 0.00–0.15)
Total Protein, Urine: 15 mg/dL

## 2022-12-28 LAB — APTT: aPTT: 29 seconds (ref 24–36)

## 2022-12-28 MED ORDER — CYCLOBENZAPRINE HCL 10 MG PO TABS: 10.0000 mg | ORAL_TABLET | Freq: Three times a day (TID) | ORAL | 1 refills | Status: DC | PRN

## 2022-12-28 MED ORDER — FERROUS SULFATE 325 (65 FE) MG PO TBEC: 325.0000 mg | DELAYED_RELEASE_TABLET | Freq: Two times a day (BID) | ORAL | 3 refills | Status: DC

## 2022-12-28 NOTE — Discharge Instructions (Signed)
If your headache does not improve with tylenol/flexeril at home, please present to MAU.

## 2022-12-28 NOTE — MAU Provider Note (Addendum)
History     CSN: [id number]  Arrival date and time: 12/28/22 1243   First contact with provider initiated at approximately 1330  Chief Complaint  Patient presents with   Abdominal Pain   Leg Swelling   Nicole Mclean is a 27 y/o female [id number] 32 weeks 6 days presenting today for ongoing headaches, dizziness, and spots in her vision for 14 days. Patient also reports swelling bilaterally in her feet, ankles, and hands as well as her face and a burning pain only in her left medial anterior foot for roughly 14 days as well. Patient describes the headaches as tension-like, daily and steady/not worsening. She has tried Tylenol for her headache and this has been somewhat effective. Vision spots have been daily and steady/not worsening. Patient was seen in the office this morning by Dr. Lanae Crumbly and sent to MAU for preeclampsia evaluation. Patient denies RUQ pain or visual or auditory hallucinations. Patient states her dizziness is not related to positional changes (lying down to sitting, sitting to standing) and she has not had any fainting episodes. Pain level 0/10. Patient's blood pressure was 136/88 in the earlier office visit and was 128/69 at the time of triage today. Patient does not check her blood pressure regularly and does not have a cuff at home.  Patient does report prior carpal tunnel symptoms and wears a wrist brace as needed.  Patient reports + fetal movement and denies contractions, vaginal bleeding, and loss of fluid.  OB History     Gravida  4   Para  2   Term  2   Preterm  0   AB  1   Living  2      SAB  1   IAB  0   Ectopic  0   Multiple  0   Live Births  2           Past Medical History:  Diagnosis Date   Chlamydia 01/2016   Gonorrhea April 2015   Gonorrhea    Infection    UTI   STD (female)    Syphilis    UTI (urinary tract infection)     Past Surgical History:  Procedure Laterality Date   chin surgery     FLEXIBLE SIGMOIDOSCOPY N/A  10/11/2022   Procedure: FLEXIBLE SIGMOIDOSCOPY;  Surgeon: Lynann Bologna, MD;  Location: [facility] ENDOSCOPY;  Service: Gastroenterology;  Laterality: N/A;   HAND SURGERY     Left  hand   mirena      mirena iud removed    Family History  Adopted: Yes  Problem Relation Age of Onset   Lung disease Mother    Depression Mother    Heart disease Father    Diabetes Father    Hypertension Father    Learning disabilities Neg Hx    Mental illness Neg Hx    Mental retardation Neg Hx    Vision loss Neg Hx    Varicose Veins Neg Hx    Asthma Neg Hx     Social History   Tobacco Use   Smoking status: Never    Passive exposure: Never   Smokeless tobacco: Never  Vaping Use   Vaping Use: Never used  Substance Use Topics   Alcohol use: No    Alcohol/week: 0.0 standard drinks of alcohol   Drug use: No    Allergies: No Known Allergies  Medications Prior to Admission  Medication Sig Dispense Refill Last Dose   acetaminophen (TYLENOL) 500 MG tablet Take 2 tablets (  1,000 mg total) by mouth every 6 (six) hours as needed. 30 tablet 0 12/27/2022   aspirin EC 81 MG tablet Take 1 tablet (81 mg total) by mouth at bedtime. Start taking for rest of pregnancy for prevention of preeclampsia 300 tablet 2 12/27/2022   Prenatal Vit-Fe Fumarate-FA (PRENATAL VITAMIN PLUS LOW IRON) 27-1 MG TABS Take 1 tablet by mouth daily. 30 tablet 12 12/28/2022   Blood Pressure Monitoring (BLOOD PRESSURE KIT) DEVI 1 kit by Does not apply route once a week. (Patient not taking: Reported on 12/28/2022) 1 each 0    cyclobenzaprine (FLEXERIL) 10 MG tablet Take 1 tablet (10 mg total) by mouth every 8 (eight) hours as needed for muscle spasms. 30 tablet 1     Review of Systems  Constitutional:  Negative for chills, diaphoresis and fever.  Eyes:  Positive for visual disturbance (flashing spots).  Respiratory:  Negative for chest tightness and shortness of breath.   Cardiovascular:  Positive for leg swelling. Negative for chest pain and  palpitations.  Gastrointestinal:  Positive for abdominal pain. Negative for constipation, diarrhea, nausea and vomiting.  Genitourinary:  Negative for flank pain, pelvic pain, vaginal bleeding and vaginal discharge.  Skin:  Negative for pallor and rash.  Neurological:  Positive for dizziness, numbness (and burning sensation in left medial anterior foot) and headaches. Negative for seizures, syncope, weakness and light-headedness.   Physical Exam   Blood pressure 116/76, pulse 91, temperature 98.4 F (36.9 C), temperature source Oral, resp. rate (!) 22, last menstrual period 05/06/2022, SpO2 98 %.  Physical Exam Vitals and nursing note reviewed.  Constitutional:      General: She is not in acute distress.    Appearance: Normal appearance. She is not ill-appearing or toxic-appearing.  HENT:     Head: Normocephalic and atraumatic.     Mouth/Throat:     Mouth: Mucous membranes are moist.     Pharynx: Oropharynx is clear.  Eyes:     General: Lids are normal. No scleral icterus.    Conjunctiva/sclera: Conjunctivae normal.     Pupils: Pupils are equal, round, and reactive to light.  Cardiovascular:     Rate and Rhythm: Tachycardia present.     Pulses: Normal pulses.          Radial pulses are 2+ on the right side and 2+ on the left side.       Dorsalis pedis pulses are 2+ on the right side and 2+ on the left side.     Heart sounds: Normal heart sounds. No murmur heard. Pulmonary:     Effort: Pulmonary effort is normal. No respiratory distress.     Breath sounds: Normal breath sounds.  Abdominal:     General: There is no distension.     Palpations: Abdomen is soft.     Tenderness: There is no abdominal tenderness. There is no guarding or rebound.  Musculoskeletal:     Right hand: Swelling present. No deformity.     Left hand: Swelling present. No deformity.     Cervical back: Neck supple.     Right lower leg: Edema present.     Left lower leg: Edema present.     Right foot: Normal  range of motion. No deformity.     Left foot: Normal range of motion. No deformity.  Feet:     Right foot:     Skin integrity: Skin integrity normal.     Left foot:     Skin integrity: Skin integrity normal.  Skin:    General: Skin is warm and dry.     Capillary Refill: Capillary refill takes less than 2 seconds.  Neurological:     Mental Status: She is alert and oriented to person, place, and time.  Psychiatric:        Mood and Affect: Mood normal.        Behavior: Behavior normal.        Thought Content: Thought content normal.        Judgment: Judgment normal.    MAU Course   Results for orders placed or performed during the hospital encounter of 12/28/22 (from the past 24 hour(s))  Urinalysis, Routine w reflex microscopic -Urine, Clean Catch     Status: Abnormal   Collection Time: 12/28/22  1:01 PM  Result Value Ref Range   Color, Urine YELLOW YELLOW   APPearance HAZY (A) CLEAR   Specific Gravity, Urine 1.026 1.005 - 1.030   pH 5.0 5.0 - 8.0   Glucose, UA NEGATIVE NEGATIVE mg/dL   Hgb urine dipstick NEGATIVE NEGATIVE   Bilirubin Urine NEGATIVE NEGATIVE   Ketones, ur NEGATIVE NEGATIVE mg/dL   Protein, ur NEGATIVE NEGATIVE mg/dL   Nitrite NEGATIVE NEGATIVE   Leukocytes,Ua TRACE (A) NEGATIVE   RBC / HPF 0-5 0 - 5 RBC/hpf   WBC, UA 6-10 0 - 5 WBC/hpf   Bacteria, UA RARE (A) NONE SEEN   Squamous Epithelial / HPF 0-5 0 - 5 /HPF   Mucus PRESENT    Ca Oxalate Crys, UA PRESENT   CBC with Differential/Platelet     Status: Abnormal   Collection Time: 12/28/22  1:39 PM  Result Value Ref Range   WBC 9.8 4.0 - 10.5 K/uL   RBC 3.72 (L) 3.87 - 5.11 MIL/uL   Hemoglobin 10.6 (L) 12.0 - 15.0 g/dL   HCT 16.1 (L) 09.6 - 04.5 %   MCV 87.6 80.0 - 100.0 fL   MCH 28.5 26.0 - 34.0 pg   MCHC 32.5 30.0 - 36.0 g/dL   RDW 40.9 81.1 - 91.4 %   Platelets 268 150 - 400 K/uL   nRBC 0.0 0.0 - 0.2 %   Neutrophils Relative % 72 %   Neutro Abs 7.1 1.7 - 7.7 K/uL   Lymphocytes Relative 20 %    Lymphs Abs 1.9 0.7 - 4.0 K/uL   Monocytes Relative 6 %   Monocytes Absolute 0.6 0.1 - 1.0 K/uL   Eosinophils Relative 1 %   Eosinophils Absolute 0.1 0.0 - 0.5 K/uL   Basophils Relative 0 %   Basophils Absolute 0.0 0.0 - 0.1 K/uL   Immature Granulocytes 1 %   Abs Immature Granulocytes 0.05 0.00 - 0.07 K/uL  Comprehensive metabolic panel     Status: Abnormal   Collection Time: 12/28/22  1:39 PM  Result Value Ref Range   Sodium 135 135 - 145 mmol/L   Potassium 3.6 3.5 - 5.1 mmol/L   Chloride 103 98 - 111 mmol/L   CO2 21 (L) 22 - 32 mmol/L   Glucose, Bld 96 70 - 99 mg/dL   BUN 5 (L) 6 - 20 mg/dL   Creatinine, Ser 7.82 0.44 - 1.00 mg/dL   Calcium 8.8 (L) 8.9 - 10.3 mg/dL   Total Protein 5.7 (L) 6.5 - 8.1 g/dL   Albumin 2.6 (L) 3.5 - 5.0 g/dL   AST 13 (L) 15 - 41 U/L   ALT 11 0 - 44 U/L   Alkaline Phosphatase 110 38 - 126 U/L  Total Bilirubin 0.2 (L) 0.3 - 1.2 mg/dL   GFR, Estimated >40 >98 mL/min   Anion gap 11 5 - 15  APTT     Status: None   Collection Time: 12/28/22  1:39 PM  Result Value Ref Range   aPTT 29 24 - 36 seconds  Protime-INR     Status: None   Collection Time: 12/28/22  1:39 PM  Result Value Ref Range   Prothrombin Time 14.3 11.4 - 15.2 seconds   INR 1.1 0.8 - 1.2  Protein / creatinine ratio, urine     Status: None   Collection Time: 12/28/22  1:42 PM  Result Value Ref Range   Creatinine, Urine 207 mg/dL   Total Protein, Urine 15 mg/dL   Protein Creatinine Ratio 0.07 0.00 - 0.15 mg/mg[Cre]    MDM: moderate - Ordered UA, CBC w/ diff, CMP, APTT, Protime-INR (1339) and Urine protein/creatinine ratio (1341) - Results discussed with patient (1538). Discussed option for thrombosis/embolism workup with CT scan or MRI of brain. Patient declined and states she will return to MAU if vision changes and/or headache begin worsening or are not relieved with Tylenol and Flexeril.   Assessment and Plan   1. Supervision of high risk pregnancy in third trimester -  Discharge patient  2. Vision changes - Discharge patient  3. Pregnancy headache in third trimester - Discharge patient  4. [redacted] weeks gestation of pregnancy - Discharge patient  5. Swelling of lower extremity during pregnancy in third trimester - Discharge patient  6. Maternal morbid obesity, antepartum (HCC) - Discharge patient  7. Rectal bleeding - Discharge patient  8. Other iron deficiency anemias - Discharge patient  - Normocytic anemia present on CBC w/ diff. Patient is currently taking a prenatal vitamin containing Fumarate. Added ferrous sulfate 325 (65 FE) mg PO tab to daily home medications.  - Patient instructed to continue Tylenol and start Flexeril for headache- prescribed from office visit. - Return precautions discussed regarding elevated blood pressure, worsening vision and headaches not relieved with medication, numbness, and other stroke-like symptoms on when to return to MAU. Patient understands and is agreeable with the plan.   Future Appointments  Date Time Provider Department Center  01/02/2023 10:30 AM [facility] NURSE [facility]  01/02/2023 10:45 AM [facility] US4 [facility]  01/09/2023  9:30 AM [facility] NURSE [facility]  01/09/2023  9:45 AM [facility] NST [facility]  01/10/2023  8:55 AM Anyanwu, Jethro Bastos, MD [facility] None  01/16/2023  9:30 AM [facility] NURSE [facility]  01/16/2023  9:45 AM [facility] NST [facility]  01/23/2023  9:15 AM [facility] NURSE [facility]  01/23/2023  9:30 AM [facility] US3 [facility]    Nicole Mclean 12/28/2022, 3:57 PM  ____  Attestation of Supervision of Student:  I confirm that I have verified the information documented in the physician assistant student's note and that I have also personally performed the history, physical exam and all medical decision making activities.  I have verified that all services and findings are accurately documented in this student's note; and I agree with management and plan as outlined in the documentation. I have  also made any necessary editorial changes.  Myrtie Hawk, DO Center for [employer], [facility] 12/28/2022 4:33 PM

## 2022-12-28 NOTE — Progress Notes (Signed)
Pt reports fetal movement and occasional pelvic pain. States that she has been having dizziness, headaches, numbness in hands, and a lot of swelling in feet. Pt states that she was suppose to have labs done after mau visit but she was unable to stay.
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
## 2022-12-28 NOTE — MAU Note (Signed)
.  Nicole Mclean is a 27 y.o. at [redacted]w[redacted]d here in MAU reporting: Sent from the office for "atypical preeclampsia evaluation". States she has has swelling in her BLE since 5/29. Over the last week she has also noticed floaters in her vision and an increase in headaches (currently denies headache). Denies VB or LOF. +FM. Patient also reports dizziness and lower abdominal cramping/pelvic pressure today.   Pain score: 4 Vitals:   12/28/22 1300  BP: 128/69  Pulse: (!) 102  Resp: (!) 22  Temp: 98.4 F (36.9 C)  SpO2: 97%     FHT:149 Lab orders placed from triage: UA

## 2022-12-29 LAB — CBC WITH DIFFERENTIAL/PLATELET
Basophils Absolute: 0 10*3/uL (ref 0.0–0.2)
Basos: 0 %
EOS (ABSOLUTE): 0.1 10*3/uL (ref 0.0–0.4)
Eos: 1 %
Hematocrit: 34.3 % (ref 34.0–46.6)
Hemoglobin: 11.2 g/dL (ref 11.1–15.9)
Immature Grans (Abs): 0.1 10*3/uL (ref 0.0–0.1)
Immature Granulocytes: 1 %
Lymphocytes Absolute: 2 10*3/uL (ref 0.7–3.1)
Lymphs: 21 %
MCH: 28.1 pg (ref 26.6–33.0)
MCHC: 32.7 g/dL (ref 31.5–35.7)
MCV: 86 fL (ref 79–97)
Monocytes Absolute: 0.4 10*3/uL (ref 0.1–0.9)
Monocytes: 4 %
Neutrophils Absolute: 7.2 10*3/uL — ABNORMAL HIGH (ref 1.4–7.0)
Neutrophils: 73 %
Platelets: 282 10*3/uL (ref 150–450)
RBC: 3.98 x10E6/uL (ref 3.77–5.28)
RDW: 13.5 % (ref 11.7–15.4)
WBC: 9.8 10*3/uL (ref 3.4–10.8)

## 2022-12-29 LAB — COMPREHENSIVE METABOLIC PANEL
ALT: 8 IU/L (ref 0–32)
AST: 12 IU/L (ref 0–40)
Albumin/Globulin Ratio: 1.4
Albumin: 3.4 g/dL — ABNORMAL LOW (ref 4.0–5.0)
Alkaline Phosphatase: 147 IU/L — ABNORMAL HIGH (ref 44–121)
BUN/Creatinine Ratio: 10 (ref 9–23)
BUN: 5 mg/dL — ABNORMAL LOW (ref 6–20)
Bilirubin Total: <0.2 mg/dL (ref 0.0–1.2)
CO2: 19 mmol/L — ABNORMAL LOW (ref 20–29)
Calcium: 9.2 mg/dL (ref 8.7–10.2)
Chloride: 104 mmol/L (ref 96–106)
Creatinine, Ser: 0.5 mg/dL — ABNORMAL LOW (ref 0.57–1.00)
Globulin, Total: 2.5 g/dL (ref 1.5–4.5)
Glucose: 108 mg/dL — ABNORMAL HIGH (ref 70–99)
Potassium: 3.8 mmol/L (ref 3.5–5.2)
Sodium: 137 mmol/L (ref 134–144)
Total Protein: 5.9 g/dL — ABNORMAL LOW (ref 6.0–8.5)
eGFR: 132 mL/min/{1.73_m2} (ref >59)

## 2022-12-29 LAB — PROTEIN / CREATININE RATIO, URINE
Creatinine, Urine: 77.6 mg/dL
Protein, Ur: 9.6 mg/dL
Protein/Creat Ratio: 124 mg/g creat (ref 0–200)

## 2023-01-02 ENCOUNTER — Ambulatory Visit: Payer: No Typology Code available for payment source | Admitting: *Deleted

## 2023-01-02 ENCOUNTER — Ambulatory Visit: Payer: No Typology Code available for payment source | Attending: Obstetrics

## 2023-01-02 ENCOUNTER — Other Ambulatory Visit: Payer: Self-pay | Admitting: Obstetrics

## 2023-01-02 VITALS — BP 124/66 | HR 101

## 2023-01-02 DIAGNOSIS — O99213 Obesity complicating pregnancy, third trimester: Secondary | ICD-10-CM | POA: Insufficient documentation

## 2023-01-02 DIAGNOSIS — O0993 Supervision of high risk pregnancy, unspecified, third trimester: Secondary | ICD-10-CM | POA: Diagnosis present

## 2023-01-02 DIAGNOSIS — O98513 Other viral diseases complicating pregnancy, third trimester: Secondary | ICD-10-CM | POA: Diagnosis not present

## 2023-01-02 DIAGNOSIS — Z3A33 33 weeks gestation of pregnancy: Secondary | ICD-10-CM

## 2023-01-02 DIAGNOSIS — O3110X0 Continuing pregnancy after spontaneous abortion of one fetus or more, unspecified trimester, not applicable or unspecified: Secondary | ICD-10-CM

## 2023-01-02 DIAGNOSIS — O99212 Obesity complicating pregnancy, second trimester: Secondary | ICD-10-CM | POA: Insufficient documentation

## 2023-01-02 DIAGNOSIS — B009 Herpesviral infection, unspecified: Secondary | ICD-10-CM

## 2023-01-02 DIAGNOSIS — E669 Obesity, unspecified: Secondary | ICD-10-CM

## 2023-01-02 NOTE — Procedures (Signed)
Nicole Mclean [birth year] [redacted]w[redacted]d  Fetus A Non-Stress Test Interpretation for 01/02/23  Indication:  Obesity, Failed BPP  Fetal Heart Rate A Mode: External Baseline Rate (A): 135 bpm Variability: Moderate Accelerations: 15 x 15 Decelerations: None Multiple birth?: No  Uterine Activity Mode: Palpation, Toco Contraction Frequency (min): UI Contraction Quality: Mild Resting Tone Palpated: Relaxed Resting Time: Adequate  Interpretation (Fetal Testing) Nonstress Test Interpretation: Reactive Comments: Dr. Parke Poisson reviewed tracing

## 2023-01-09 ENCOUNTER — Ambulatory Visit: Payer: No Typology Code available for payment source | Admitting: *Deleted

## 2023-01-09 ENCOUNTER — Ambulatory Visit: Payer: No Typology Code available for payment source | Attending: Maternal & Fetal Medicine | Admitting: *Deleted

## 2023-01-09 ENCOUNTER — Encounter: Payer: Self-pay | Admitting: *Deleted

## 2023-01-09 VITALS — BP 131/74 | HR 82

## 2023-01-09 DIAGNOSIS — O99213 Obesity complicating pregnancy, third trimester: Secondary | ICD-10-CM | POA: Insufficient documentation

## 2023-01-09 DIAGNOSIS — O0993 Supervision of high risk pregnancy, unspecified, third trimester: Secondary | ICD-10-CM

## 2023-01-09 DIAGNOSIS — Z3A34 34 weeks gestation of pregnancy: Secondary | ICD-10-CM | POA: Insufficient documentation

## 2023-01-09 NOTE — Procedures (Signed)
Nicole Mclean 1995/08/02 [redacted]w[redacted]d  Fetus A Non-Stress Test Interpretation for 01/09/23  Indication:  Obesity  Fetal Heart Rate A Mode: External Baseline Rate (A): 140 bpm Variability: Moderate Accelerations: 15 x 15 Decelerations: None Multiple birth?: No  Uterine Activity Mode: Palpation, Toco Contraction Frequency (min): UI Contraction Quality: Mild Resting Tone Palpated: Relaxed Resting Time: Adequate  Interpretation (Fetal Testing) Nonstress Test Interpretation: Reactive Comments: Dr. Judeth Cornfield reviewed tracing.

## 2023-01-10 ENCOUNTER — Ambulatory Visit (INDEPENDENT_AMBULATORY_CARE_PROVIDER_SITE_OTHER): Payer: No Typology Code available for payment source | Admitting: Obstetrics & Gynecology

## 2023-01-10 ENCOUNTER — Encounter: Payer: Self-pay | Admitting: Obstetrics & Gynecology

## 2023-01-10 VITALS — BP 133/85 | HR 72 | Wt 279.0 lb

## 2023-01-10 DIAGNOSIS — O99210 Obesity complicating pregnancy, unspecified trimester: Secondary | ICD-10-CM

## 2023-01-10 DIAGNOSIS — O0993 Supervision of high risk pregnancy, unspecified, third trimester: Secondary | ICD-10-CM

## 2023-01-10 DIAGNOSIS — R030 Elevated blood-pressure reading, without diagnosis of hypertension: Secondary | ICD-10-CM

## 2023-01-10 DIAGNOSIS — Z3A34 34 weeks gestation of pregnancy: Secondary | ICD-10-CM
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
## 2023-01-10 NOTE — Patient Instructions (Signed)
Return to office for any scheduled appointments. Call the office or go to the MAU at [facility] if: You begin to have strong, frequent contractions Your water breaks.  Sometimes it is a big gush of fluid, sometimes it is just a trickle that keeps getting your underwear wet or running down your legs You have vaginal bleeding.  It is normal to have a small amount of spotting if your cervix was checked.  You do not feel your baby moving like normal.  If you do not, get something to eat and drink and lay down and focus on feeling your baby move.   If your baby is still not moving like normal, you should call the office or go to MAU. Any other obstetric concerns.  

## 2023-01-11 ENCOUNTER — Encounter: Payer: Self-pay | Admitting: Obstetrics and Gynecology

## 2023-01-11 ENCOUNTER — Encounter: Payer: Self-pay | Admitting: Obstetrics & Gynecology

## 2023-01-11 DIAGNOSIS — O1213 Gestational proteinuria, third trimester: Secondary | ICD-10-CM | POA: Insufficient documentation

## 2023-01-11 DIAGNOSIS — R030 Elevated blood-pressure reading, without diagnosis of hypertension: Secondary | ICD-10-CM

## 2023-01-11 LAB — CBC
Hematocrit: 33.2 % — ABNORMAL LOW (ref 34.0–46.6)
Hemoglobin: 10.9 g/dL — ABNORMAL LOW (ref 11.1–15.9)
MCH: 28.3 pg (ref 26.6–33.0)
MCHC: 32.8 g/dL (ref 31.5–35.7)
MCV: 86 fL (ref 79–97)
Platelets: 254 10*3/uL (ref 150–450)
RBC: 3.85 x10E6/uL (ref 3.77–5.28)
RDW: 13.8 % (ref 11.7–15.4)
WBC: 8.1 10*3/uL (ref 3.4–10.8)

## 2023-01-11 LAB — COMPREHENSIVE METABOLIC PANEL
ALT: 8 IU/L (ref 0–32)
AST: 11 IU/L (ref 0–40)
Albumin: 3.3 g/dL — ABNORMAL LOW (ref 4.0–5.0)
Alkaline Phosphatase: 141 IU/L — ABNORMAL HIGH (ref 44–121)
BUN/Creatinine Ratio: 16 (ref 9–23)
BUN: 8 mg/dL (ref 6–20)
Bilirubin Total: <0.2 mg/dL (ref 0.0–1.2)
CO2: 19 mmol/L — ABNORMAL LOW (ref 20–29)
Calcium: 8.8 mg/dL (ref 8.7–10.2)
Chloride: 105 mmol/L (ref 96–106)
Creatinine, Ser: 0.51 mg/dL — ABNORMAL LOW (ref 0.57–1.00)
Globulin, Total: 2.1 g/dL (ref 1.5–4.5)
Glucose: 70 mg/dL (ref 70–99)
Potassium: 4.2 mmol/L (ref 3.5–5.2)
Sodium: 138 mmol/L (ref 134–144)
Total Protein: 5.4 g/dL — ABNORMAL LOW (ref 6.0–8.5)
eGFR: 130 mL/min/{1.73_m2} (ref >59)

## 2023-01-11 LAB — PROTEIN / CREATININE RATIO, URINE
Creatinine, Urine: 141.3 mg/dL
Protein, Ur: 52.1 mg/dL
Protein/Creat Ratio: 369 mg/g creat — ABNORMAL HIGH (ref 0–200)

## 2023-01-11 NOTE — Progress Notes (Signed)
Elevated protein in urine is concerning for preeclamspia. If BP is elevated again (only has one elevated value), she will meet criteria for preeclampsia and has to be managed accordingly. She is already getting weekly antenatal testing, but will need IOL at 37 weeks (or earlier) if BP is elevated.  Please call to inform patient of results and recommendations.   Jaynie Collins, MD

## 2023-01-12 ENCOUNTER — Encounter (HOSPITAL_COMMUNITY): Payer: Self-pay | Admitting: Family Medicine

## 2023-01-12 ENCOUNTER — Inpatient Hospital Stay (HOSPITAL_COMMUNITY)
Admission: AD | Admit: 2023-01-12 | Discharge: 2023-01-12 | Disposition: A | Discharge status: Home / Self Care | Payer: No Typology Code available for payment source | Attending: Family Medicine | Admitting: Family Medicine

## 2023-01-12 DIAGNOSIS — R519 Headache, unspecified: Secondary | ICD-10-CM | POA: Insufficient documentation

## 2023-01-12 DIAGNOSIS — O0993 Supervision of high risk pregnancy, unspecified, third trimester: Secondary | ICD-10-CM | POA: Insufficient documentation

## 2023-01-12 DIAGNOSIS — O1403 Mild to moderate pre-eclampsia, third trimester: Secondary | ICD-10-CM | POA: Insufficient documentation

## 2023-01-12 DIAGNOSIS — O1414 Severe pre-eclampsia complicating childbirth: Secondary | ICD-10-CM | POA: Diagnosis not present

## 2023-01-12 DIAGNOSIS — O26893 Other specified pregnancy related conditions, third trimester: Secondary | ICD-10-CM | POA: Insufficient documentation

## 2023-01-12 DIAGNOSIS — Z3A35 35 weeks gestation of pregnancy: Secondary | ICD-10-CM | POA: Insufficient documentation

## 2023-01-12 LAB — URINALYSIS, ROUTINE W REFLEX MICROSCOPIC
Bilirubin Urine: NEGATIVE
Glucose, UA: NEGATIVE mg/dL
Ketones, ur: NEGATIVE mg/dL
Nitrite: NEGATIVE
Protein, ur: 30 mg/dL — AB
Specific Gravity, Urine: 1.014 (ref 1.005–1.030)
pH: 6 (ref 5.0–8.0)

## 2023-01-12 LAB — COMPREHENSIVE METABOLIC PANEL
ALT: 13 U/L (ref 0–44)
AST: 17 U/L (ref 15–41)
Albumin: 2.6 g/dL — ABNORMAL LOW (ref 3.5–5.0)
Alkaline Phosphatase: 109 U/L (ref 38–126)
Anion gap: 10 (ref 5–15)
BUN: <5 mg/dL — ABNORMAL LOW (ref 6–20)
CO2: 21 mmol/L — ABNORMAL LOW (ref 22–32)
Calcium: 8.8 mg/dL — ABNORMAL LOW (ref 8.9–10.3)
Chloride: 105 mmol/L (ref 98–111)
Creatinine, Ser: 0.59 mg/dL (ref 0.44–1.00)
GFR, Estimated: >60 mL/min (ref >60)
Glucose, Bld: 108 mg/dL — ABNORMAL HIGH (ref 70–99)
Potassium: 3.4 mmol/L — ABNORMAL LOW (ref 3.5–5.1)
Sodium: 136 mmol/L (ref 135–145)
Total Bilirubin: 0.3 mg/dL (ref 0.3–1.2)
Total Protein: 5.8 g/dL — ABNORMAL LOW (ref 6.5–8.1)

## 2023-01-12 LAB — PROTEIN / CREATININE RATIO, URINE
Creatinine, Urine: 126 mg/dL
Protein Creatinine Ratio: 0.32 mg/mg{Cre} — ABNORMAL HIGH (ref 0.00–0.15)
Total Protein, Urine: 40 mg/dL

## 2023-01-12 LAB — CBC
HCT: 32.5 % — ABNORMAL LOW (ref 36.0–46.0)
Hemoglobin: 10.6 g/dL — ABNORMAL LOW (ref 12.0–15.0)
MCH: 28.3 pg (ref 26.0–34.0)
MCHC: 32.6 g/dL (ref 30.0–36.0)
MCV: 86.9 fL (ref 80.0–100.0)
Platelets: 233 10*3/uL (ref 150–400)
RBC: 3.74 MIL/uL — ABNORMAL LOW (ref 3.87–5.11)
RDW: 13.6 % (ref 11.5–15.5)
WBC: 8 10*3/uL (ref 4.0–10.5)
nRBC: 0 % (ref 0.0–0.2)

## 2023-01-12 MED ORDER — ACETAMINOPHEN 500 MG PO TABS
1000.0000 mg | ORAL_TABLET | Freq: Once | ORAL | Status: AC
  Administered 2023-01-12: 1000 mg via ORAL
  Filled 2023-01-12: qty 2

## 2023-01-12 NOTE — MAU Provider Note (Signed)
Chief Complaint:  Labs Only, Hypertension, Decreased Fetal Movement, Abdominal Pain, and Chest Pain   Event Date/Time   First Provider Initiated Contact with Patient 01/12/23 1109      HPI: Nicole Mclean is a 27 y.o. [id number] at [redacted]w[redacted]d who presents to maternity admissions sent over from the office for elevated home BPs. She reports hypertension, decreased fetal movement, RUQ abdominal pain, and chest pain. She reports good fetal movement, denies LOF, vaginal bleeding, vaginal itching/burning, urinary symptoms, h/a, dizziness, n/v, or fever/chills.  She also reports blurred vision and h/a last night but none this morning.    HPI  Past Medical History: Past Medical History:  Diagnosis Date   Chlamydia 01/2016   Gonorrhea April 2015   Gonorrhea    Infection    UTI   STD (female)    Syphilis    UTI (urinary tract infection)     Past obstetric history: OB History  Gravida Para Term Preterm AB Living  4 2 2  0 1 2  SAB IAB Ectopic Multiple Live Births  1 0 0 0 2    # Outcome Date GA Lbr Len/2nd Weight Sex Delivery Anes PTL Lv  4 Current           3 Term 07/02/15 [redacted]w[redacted]d 09:15 / 01:45 3075 g M Vag-Spont EPI  LIV  2 Term 06/07/14 [redacted]w[redacted]d 15:32 / 01:19 3320 g M Vag-Spont EPI  LIV  1 SAB 2014            Past Surgical History: Past Surgical History:  Procedure Laterality Date   chin surgery     FLEXIBLE SIGMOIDOSCOPY N/A 10/11/2022   Procedure: FLEXIBLE SIGMOIDOSCOPY;  Surgeon: Lynann Bologna, MD;  Location: [facility] ENDOSCOPY;  Service: Gastroenterology;  Laterality: N/A;   HAND SURGERY     Left  hand   mirena      mirena iud removed    Family History: Family History  Adopted: Yes  Problem Relation Age of Onset   Lung disease Mother    Depression Mother    Heart disease Father    Diabetes Father    Hypertension Father    Learning disabilities Neg Hx    Mental illness Neg Hx    Mental retardation Neg Hx    Vision loss Neg Hx    Varicose Veins Neg Hx    Asthma Neg Hx      Social History: Social History   Tobacco Use   Smoking status: Never    Passive exposure: Never   Smokeless tobacco: Never  Vaping Use   Vaping Use: Never used  Substance Use Topics   Alcohol use: No    Alcohol/week: 0.0 standard drinks of alcohol   Drug use: No    Allergies: No Known Allergies  Meds:  Medications Prior to Admission  Medication Sig Dispense Refill Last Dose   acetaminophen (TYLENOL) 500 MG tablet Take 2 tablets (1,000 mg total) by mouth every 6 (six) hours as needed. 30 tablet 0    aspirin EC 81 MG tablet Take 1 tablet (81 mg total) by mouth at bedtime. Start taking for rest of pregnancy for prevention of preeclampsia 300 tablet 2    Blood Pressure Monitoring (BLOOD PRESSURE KIT) DEVI 1 kit by Does not apply route once a week. (Patient not taking: Reported on 12/28/2022) 1 each 0    cyclobenzaprine (FLEXERIL) 10 MG tablet Take 1 tablet (10 mg total) by mouth every 8 (eight) hours as needed for muscle spasms. (Patient not taking:  Reported on 01/02/2023) 30 tablet 1    ferrous sulfate 325 (65 FE) MG EC tablet Take 1 tablet (325 mg total) by mouth 2 (two) times daily. 60 tablet 3    Prenatal Vit-Fe Fumarate-FA (PRENATAL VITAMIN PLUS LOW IRON) 27-1 MG TABS Take 1 tablet by mouth daily. 30 tablet 12     ROS:  Review of Systems  Constitutional:  Negative for chills, fatigue and fever.  Eyes:  Negative for visual disturbance.  Respiratory:  Negative for shortness of breath.   Cardiovascular:  Negative for chest pain.  Gastrointestinal:  Negative for abdominal pain, nausea and vomiting.  Genitourinary:  Negative for difficulty urinating, dysuria, flank pain, pelvic pain, vaginal bleeding, vaginal discharge and vaginal pain.  Neurological:  Negative for dizziness and headaches.  Psychiatric/Behavioral: Negative.       I have reviewed patient's Past Medical Hx, Surgical Hx, Family Hx, Social Hx, medications and allergies.   Physical Exam  Patient Vitals for the  past 24 hrs:  BP Temp Temp src Pulse Resp SpO2 Weight  01/12/23 1044 123/72 98.4 F (36.9 C) Oral 93 14 99 % --  01/12/23 1038 -- -- -- -- -- -- 128.8 kg   Constitutional: Well-developed, well-nourished female in no acute distress.  Cardiovascular: normal rate Respiratory: normal effort GI: Abd soft, non-tender, gravid appropriate for gestational age.  MS: Extremities nontender, no edema, normal ROM Neurologic: Alert and oriented x 4.  GU: Neg CVAT.  PELVIC EXAM:      FHT:  Baseline 140 , moderate variability, accelerations present, no decelerations Contractions: irregular, mild to palpation   Labs: No results found for this or any previous visit (from the past 24 hour(s)). --/--/O POS (03/25 1737) Recent Results (from the past 2160 hour(s))       Protein / creatinine ratio, urine     Status: Abnormal   Collection Time: 01/12/23 10:27 AM  Result Value Ref Range   Creatinine, Urine 126 mg/dL   Total Protein, Urine 40 mg/dL    Comment: NO NORMAL RANGE ESTABLISHED FOR THIS TEST   Protein Creatinine Ratio 0.32 (H) 0.00 - 0.15 mg/mg[Cre]    Comment: Performed at [facility], [address], [place], [place] [address]  Urinalysis, Routine w reflex microscopic -Urine, Clean Catch     Status: Abnormal   Collection Time: 01/12/23 10:27 AM  Result Value Ref Range   Color, Urine YELLOW YELLOW   APPearance HAZY (A) CLEAR   Specific Gravity, Urine 1.014 1.005 - 1.030   pH 6.0 5.0 - 8.0   Glucose, UA NEGATIVE NEGATIVE mg/dL   Hgb urine dipstick SMALL (A) NEGATIVE   Bilirubin Urine NEGATIVE NEGATIVE   Ketones, ur NEGATIVE NEGATIVE mg/dL   Protein, ur 30 (A) NEGATIVE mg/dL   Nitrite NEGATIVE NEGATIVE   Leukocytes,Ua TRACE (A) NEGATIVE   RBC / HPF 0-5 0 - 5 RBC/hpf   WBC, UA 0-5 0 - 5 WBC/hpf   Bacteria, UA RARE (A) NONE SEEN   Squamous Epithelial / HPF 0-5 0 - 5 /HPF   Mucus PRESENT     Comment: Performed at [facility], [address], [place], [place] [address]   CBC     Status: Abnormal   Collection Time: 01/12/23 10:43 AM  Result Value Ref Range   WBC 8.0 4.0 - 10.5 K/uL   RBC 3.74 (L) 3.87 - 5.11 MIL/uL   Hemoglobin 10.6 (L) 12.0 - 15.0 g/dL   HCT 19.1 (L) 47.8 - 29.5 %  MCV 86.9 80.0 - 100.0 fL   MCH 28.3 26.0 - 34.0 pg   MCHC 32.6 30.0 - 36.0 g/dL   RDW 96.0 45.4 - 09.8 %   Platelets 233 150 - 400 K/uL   nRBC 0.0 0.0 - 0.2 %    Comment: Performed at [facility], [address], [place], [place] [address]  Comprehensive metabolic panel     Status: Abnormal   Collection Time: 01/12/23 10:43 AM  Result Value Ref Range   Sodium 136 135 - 145 mmol/L   Potassium 3.4 (L) 3.5 - 5.1 mmol/L   Chloride 105 98 - 111 mmol/L   CO2 21 (L) 22 - 32 mmol/L   Glucose, Bld 108 (H) 70 - 99 mg/dL    Comment: Glucose reference range applies only to samples taken after fasting for at least 8 hours.   BUN <5 (L) 6 - 20 mg/dL   Creatinine, Ser 7.82 0.44 - 1.00 mg/dL   Calcium 8.8 (L) 8.9 - 10.3 mg/dL   Total Protein 5.8 (L) 6.5 - 8.1 g/dL   Albumin 2.6 (L) 3.5 - 5.0 g/dL   AST 17 15 - 41 U/L   ALT 13 0 - 44 U/L   Alkaline Phosphatase 109 38 - 126 U/L   Total Bilirubin 0.3 0.3 - 1.2 mg/dL   GFR, Estimated >95 >62 mL/min    Comment: (NOTE) Calculated using the CKD-EPI Creatinine Equation (2021)    Anion gap 10 5 - 15    Comment: Performed at [facility], [address] [place], [place] [address]    Imaging:   MAU Course/MDM: Orders Placed This Encounter  Procedures   CBC   Comprehensive metabolic panel   Protein / creatinine ratio, urine   Urinalysis, Routine w reflex microscopic -Urine, Clean Catch   Vital signs    No orders of the defined types were placed in this encounter.    NST reviewed and reactive BP wnl in MAU, no h/a or vision changes Chest pain is reproducible, c/w MSK pain and mild per pt PEC labs wnl except P/C ratio is 0.32 Consult [facility] with presentation, exam findings and test results.  D/C home with close  outpatient follow up Return to MAU with worsening BPs or s/sx of PEC   Assessment:  1. Supervision of high risk pregnancy in third trimester   2. Antepartum mild preeclampsia, third trimester   3. [redacted] weeks gestation of pregnancy   4. Headache in pregnancy, antepartum, third trimester      Plan: Discharge home Labor precautions and fetal kick counts    Sharen Counter Certified Nurse-Midwife 01/12/2023 11:09 AM

## 2023-01-12 NOTE — MAU Note (Signed)
.  Nicole Mclean is a 27 y.o. at [redacted]w[redacted]d here in MAU reporting: sent over from office because her PCR was elevated in the office x2 days ago and she has had elevated Bps at home. Has not checked her BP today. Denies VB or LOF. DFM today. Also reporting left upper quadrant abdominal pain and pain in her chest that started this morning. Also having blurred vision and had a headache last night but none this morning.   Pain score: 6 Vitals:   01/12/23 1044  BP: 123/72  Pulse: 93  Resp: 14  Temp: 98.4 F (36.9 C)  SpO2: 99%     FHT:135 Lab orders placed from triage:  UA

## 2023-01-13 ENCOUNTER — Telehealth: Payer: Self-pay

## 2023-01-13 NOTE — Telephone Encounter (Signed)
Called patient to follow up on elevated BP recorded in [facility] at 153/100. No sx. Discussed with Dr. Debroah Loop. Patient to follow up on 7/2 for BP check and repeat labs unless BP worsens over the weekend and or develop sx. Patient verbalized understanding.

## 2023-01-15 ENCOUNTER — Other Ambulatory Visit: Payer: Self-pay

## 2023-01-15 ENCOUNTER — Inpatient Hospital Stay (HOSPITAL_COMMUNITY)
Admission: AD | Admit: 2023-01-15 | Discharge: 2023-01-20 | DRG: 788 | Disposition: A | Discharge status: Home / Self Care | Payer: No Typology Code available for payment source | Attending: Obstetrics and Gynecology | Admitting: Obstetrics and Gynecology

## 2023-01-15 ENCOUNTER — Telehealth: Payer: Self-pay | Admitting: Obstetrics & Gynecology

## 2023-01-15 ENCOUNTER — Encounter (HOSPITAL_COMMUNITY): Payer: Self-pay | Admitting: Obstetrics & Gynecology

## 2023-01-15 DIAGNOSIS — Z98891 History of uterine scar from previous surgery: Secondary | ICD-10-CM

## 2023-01-15 DIAGNOSIS — O99892 Other specified diseases and conditions complicating childbirth: Secondary | ICD-10-CM | POA: Diagnosis present

## 2023-01-15 DIAGNOSIS — R079 Chest pain, unspecified: Secondary | ICD-10-CM | POA: Diagnosis present

## 2023-01-15 DIAGNOSIS — O9902 Anemia complicating childbirth: Secondary | ICD-10-CM | POA: Diagnosis present

## 2023-01-15 DIAGNOSIS — O98213 Gonorrhea complicating pregnancy, third trimester: Secondary | ICD-10-CM | POA: Diagnosis present

## 2023-01-15 DIAGNOSIS — Z3A35 35 weeks gestation of pregnancy: Secondary | ICD-10-CM

## 2023-01-15 DIAGNOSIS — O1414 Severe pre-eclampsia complicating childbirth: Secondary | ICD-10-CM | POA: Diagnosis not present

## 2023-01-15 DIAGNOSIS — O99214 Obesity complicating childbirth: Secondary | ICD-10-CM | POA: Diagnosis not present

## 2023-01-15 DIAGNOSIS — O0993 Supervision of high risk pregnancy, unspecified, third trimester: Secondary | ICD-10-CM

## 2023-01-15 DIAGNOSIS — O26893 Other specified pregnancy related conditions, third trimester: Secondary | ICD-10-CM

## 2023-01-15 DIAGNOSIS — D509 Iron deficiency anemia, unspecified: Secondary | ICD-10-CM | POA: Diagnosis present

## 2023-01-15 DIAGNOSIS — O1413 Severe pre-eclampsia, third trimester: Principal | ICD-10-CM

## 2023-01-15 DIAGNOSIS — O1410 Severe pre-eclampsia, unspecified trimester: Secondary | ICD-10-CM | POA: Diagnosis present

## 2023-01-15 DIAGNOSIS — O368130 Decreased fetal movements, third trimester, not applicable or unspecified: Secondary | ICD-10-CM | POA: Diagnosis present

## 2023-01-15 DIAGNOSIS — O1403 Mild to moderate pre-eclampsia, third trimester: Secondary | ICD-10-CM

## 2023-01-15 HISTORY — DX: Essential (primary) hypertension: I10

## 2023-01-15 HISTORY — DX: Unspecified pre-eclampsia, unspecified trimester: O14.90

## 2023-01-15 LAB — COMPREHENSIVE METABOLIC PANEL
ALT: 12 U/L (ref 0–44)
AST: 12 U/L — ABNORMAL LOW (ref 15–41)
Albumin: 2.5 g/dL — ABNORMAL LOW (ref 3.5–5.0)
Alkaline Phosphatase: 118 U/L (ref 38–126)
Anion gap: 7 (ref 5–15)
BUN: 7 mg/dL (ref 6–20)
CO2: 22 mmol/L (ref 22–32)
Calcium: 8.8 mg/dL — ABNORMAL LOW (ref 8.9–10.3)
Chloride: 108 mmol/L (ref 98–111)
Creatinine, Ser: 0.54 mg/dL (ref 0.44–1.00)
GFR, Estimated: >60 mL/min (ref >60)
Glucose, Bld: 90 mg/dL (ref 70–99)
Potassium: 3.9 mmol/L (ref 3.5–5.1)
Sodium: 137 mmol/L (ref 135–145)
Total Bilirubin: 0.3 mg/dL (ref 0.3–1.2)
Total Protein: 5.6 g/dL — ABNORMAL LOW (ref 6.5–8.1)

## 2023-01-15 LAB — CBC WITH DIFFERENTIAL/PLATELET
Abs Immature Granulocytes: 0.04 10*3/uL (ref 0.00–0.07)
Basophils Absolute: 0 10*3/uL (ref 0.0–0.1)
Basophils Relative: 0 %
Eosinophils Absolute: 0.1 10*3/uL (ref 0.0–0.5)
Eosinophils Relative: 1 %
HCT: 32.1 % — ABNORMAL LOW (ref 36.0–46.0)
Hemoglobin: 10.7 g/dL — ABNORMAL LOW (ref 12.0–15.0)
Immature Granulocytes: 0 %
Lymphocytes Relative: 27 %
Lymphs Abs: 2.4 10*3/uL (ref 0.7–4.0)
MCH: 28.8 pg (ref 26.0–34.0)
MCHC: 33.3 g/dL (ref 30.0–36.0)
MCV: 86.3 fL (ref 80.0–100.0)
Monocytes Absolute: 0.6 10*3/uL (ref 0.1–1.0)
Monocytes Relative: 7 %
Neutro Abs: 5.8 10*3/uL (ref 1.7–7.7)
Neutrophils Relative %: 65 %
Platelets: 251 10*3/uL (ref 150–400)
RBC: 3.72 MIL/uL — ABNORMAL LOW (ref 3.87–5.11)
RDW: 13.6 % (ref 11.5–15.5)
WBC: 8.9 10*3/uL (ref 4.0–10.5)
nRBC: 0 % (ref 0.0–0.2)

## 2023-01-15 MED ORDER — ONDANSETRON HCL 4 MG/2ML IJ SOLN
4.0000 mg | Freq: Four times a day (QID) | INTRAMUSCULAR | Status: DC | PRN
  Filled 2023-01-15: qty 2

## 2023-01-15 MED ORDER — SOD CITRATE-CITRIC ACID 500-334 MG/5ML PO SOLN
30.0000 mL | ORAL | Status: DC | PRN
  Administered 2023-01-17: 30 mL via ORAL
  Filled 2023-01-15 (×3): qty 30

## 2023-01-15 MED ORDER — ACETAMINOPHEN 325 MG PO TABS
650.0000 mg | ORAL_TABLET | ORAL | Status: DC | PRN
Start: 2023-01-15 — End: 2023-01-16

## 2023-01-15 MED ORDER — OXYTOCIN-SODIUM CHLORIDE 30-0.9 UT/500ML-% IV SOLN: 2.5000 [IU]/h | INTRAVENOUS | Status: DC

## 2023-01-15 MED ORDER — NIFEDIPINE 10 MG PO CAPS
20.0000 mg | ORAL_CAPSULE | ORAL | Status: DC | PRN
  Administered 2023-01-15: 20 mg via ORAL

## 2023-01-15 MED ORDER — LABETALOL HCL 5 MG/ML IV SOLN: 40.0000 mg | INTRAVENOUS | Status: DC | PRN

## 2023-01-15 MED ORDER — LABETALOL HCL 5 MG/ML IV SOLN
INTRAVENOUS | Status: AC
  Filled 2023-01-15: qty 4

## 2023-01-15 MED ORDER — OXYCODONE-ACETAMINOPHEN 5-325 MG PO TABS
1.0000 | ORAL_TABLET | ORAL | Status: DC | PRN
Start: 2023-01-15 — End: 2023-01-16

## 2023-01-15 MED ORDER — OXYCODONE-ACETAMINOPHEN 5-325 MG PO TABS
2.0000 | ORAL_TABLET | ORAL | Status: DC | PRN
Start: 2023-01-15 — End: 2023-01-16

## 2023-01-15 MED ORDER — OXYTOCIN BOLUS FROM INFUSION: 333.0000 mL | Freq: Once | INTRAVENOUS | Status: DC

## 2023-01-15 MED ORDER — LACTATED RINGERS IV SOLN: INTRAVENOUS | Status: DC

## 2023-01-15 MED ORDER — SOD CITRATE-CITRIC ACID 500-334 MG/5ML PO SOLN
30.0000 mL | ORAL | Status: DC | PRN
Start: 2023-01-15 — End: 2023-01-16

## 2023-01-15 MED ORDER — FLEET ENEMA 7-19 GM/118ML RE ENEM: 1.0000 | ENEMA | Freq: Every day | RECTAL | Status: DC | PRN

## 2023-01-15 MED ORDER — ACETAMINOPHEN 325 MG PO TABS
650.0000 mg | ORAL_TABLET | ORAL | Status: DC | PRN
  Administered 2023-01-16 (×4): 650 mg via ORAL
  Filled 2023-01-15 (×4): qty 2

## 2023-01-15 MED ORDER — ZOLPIDEM TARTRATE 5 MG PO TABS: 5.0000 mg | ORAL_TABLET | Freq: Every evening | ORAL | Status: DC | PRN

## 2023-01-15 MED ORDER — LABETALOL HCL 5 MG/ML IV SOLN: 20.0000 mg | INTRAVENOUS | Status: DC | PRN

## 2023-01-15 MED ORDER — FENTANYL CITRATE (PF) 100 MCG/2ML IJ SOLN
50.0000 ug | INTRAMUSCULAR | Status: DC | PRN
  Administered 2023-01-16: 50 ug via INTRAVENOUS
  Administered 2023-01-16: 100 ug via INTRAVENOUS
  Administered 2023-01-16: 50 ug via INTRAVENOUS
  Filled 2023-01-15 (×3): qty 2

## 2023-01-15 MED ORDER — ONDANSETRON HCL 4 MG/2ML IJ SOLN
4.0000 mg | Freq: Four times a day (QID) | INTRAMUSCULAR | Status: DC | PRN
Start: 2023-01-15 — End: 2023-01-16

## 2023-01-15 MED ORDER — LIDOCAINE HCL (PF) 1 % IJ SOLN: 30.0000 mL | INTRAMUSCULAR | Status: DC | PRN

## 2023-01-15 MED ORDER — MAGNESIUM SULFATE BOLUS VIA INFUSION
4.0000 g | Freq: Once | INTRAVENOUS | Status: AC
  Administered 2023-01-16: 4 g via INTRAVENOUS
  Filled 2023-01-15: qty 1000

## 2023-01-15 MED ORDER — FENTANYL CITRATE (PF) 100 MCG/2ML IJ SOLN
50.0000 ug | INTRAMUSCULAR | Status: DC | PRN
Start: 2023-01-15 — End: 2023-01-16

## 2023-01-15 MED ORDER — HYDRALAZINE HCL 20 MG/ML IJ SOLN: 10.0000 mg | INTRAMUSCULAR | Status: DC | PRN

## 2023-01-15 MED ORDER — LACTATED RINGERS IV SOLN
500.0000 mL | INTRAVENOUS | Status: DC | PRN
Start: 2023-01-15 — End: 2023-01-16

## 2023-01-15 MED ORDER — LABETALOL HCL 5 MG/ML IV SOLN: 80.0000 mg | INTRAVENOUS | Status: DC | PRN

## 2023-01-15 MED ORDER — SODIUM CHLORIDE 0.9 % IV SOLN
5.0000 10*6.[IU] | Freq: Once | INTRAVENOUS | Status: AC
  Administered 2023-01-16: 5 10*6.[IU] via INTRAVENOUS
  Filled 2023-01-15: qty 5

## 2023-01-15 MED ORDER — CYCLOBENZAPRINE HCL 10 MG PO TABS
10.0000 mg | ORAL_TABLET | Freq: Three times a day (TID) | ORAL | Status: DC | PRN
  Administered 2023-01-16: 5 mg via ORAL
  Filled 2023-01-15: qty 2

## 2023-01-15 MED ORDER — LACTATED RINGERS IV SOLN: 500.0000 mL | INTRAVENOUS | Status: DC | PRN

## 2023-01-15 MED ORDER — NIFEDIPINE 10 MG PO CAPS
ORAL_CAPSULE | ORAL | Status: AC
  Filled 2023-01-15: qty 1

## 2023-01-15 MED ORDER — LIDOCAINE HCL (PF) 1 % IJ SOLN
30.0000 mL | INTRAMUSCULAR | Status: DC | PRN
Start: 2023-01-15 — End: 2023-01-16

## 2023-01-15 MED ORDER — MISOPROSTOL 50MCG HALF TABLET
50.0000 ug | ORAL_TABLET | Freq: Once | ORAL | Status: AC
  Administered 2023-01-16: 50 ug via ORAL
  Filled 2023-01-15: qty 1

## 2023-01-15 MED ORDER — OXYCODONE-ACETAMINOPHEN 5-325 MG PO TABS: 2.0000 | ORAL_TABLET | ORAL | Status: DC | PRN

## 2023-01-15 MED ORDER — MAGNESIUM SULFATE 40 GM/1000ML IV SOLN
2.0000 g/h | INTRAVENOUS | Status: DC
  Administered 2023-01-16 (×2): 2 g/h via INTRAVENOUS
  Filled 2023-01-15 (×2): qty 1000

## 2023-01-15 MED ORDER — HYDROXYZINE HCL 50 MG PO TABS: 50.0000 mg | ORAL_TABLET | Freq: Four times a day (QID) | ORAL | Status: DC | PRN

## 2023-01-15 MED ORDER — MISOPROSTOL 25 MCG QUARTER TABLET
25.0000 ug | ORAL_TABLET | Freq: Once | ORAL | Status: AC
  Administered 2023-01-16: 25 ug via VAGINAL
  Filled 2023-01-15: qty 1

## 2023-01-15 MED ORDER — NIFEDIPINE 10 MG PO CAPS
20.0000 mg | ORAL_CAPSULE | ORAL | Status: DC | PRN
  Filled 2023-01-15: qty 2

## 2023-01-15 MED ORDER — NIFEDIPINE 10 MG PO CAPS
10.0000 mg | ORAL_CAPSULE | ORAL | Status: DC | PRN
  Administered 2023-01-15: 10 mg via ORAL

## 2023-01-15 MED ORDER — OXYTOCIN-SODIUM CHLORIDE 30-0.9 UT/500ML-% IV SOLN
1.0000 m[IU]/min | INTRAVENOUS | Status: DC
  Administered 2023-01-16: 2 m[IU]/min via INTRAVENOUS
  Administered 2023-01-16: 6 m[IU]/min via INTRAVENOUS
  Filled 2023-01-15: qty 500

## 2023-01-15 MED ORDER — TERBUTALINE SULFATE 1 MG/ML IJ SOLN
0.2500 mg | Freq: Once | INTRAMUSCULAR | Status: AC | PRN
  Administered 2023-01-17: 0.25 mg via SUBCUTANEOUS
  Filled 2023-01-15: qty 1

## 2023-01-15 MED ORDER — OXYCODONE-ACETAMINOPHEN 5-325 MG PO TABS: 1.0000 | ORAL_TABLET | ORAL | Status: DC | PRN

## 2023-01-15 MED ORDER — PENICILLIN G POT IN DEXTROSE 60000 UNIT/ML IV SOLN
3.0000 10*6.[IU] | INTRAVENOUS | Status: DC
  Administered 2023-01-16 (×5): 3 10*6.[IU] via INTRAVENOUS
  Filled 2023-01-15 (×5): qty 50

## 2023-01-15 NOTE — H&P (Addendum)
Obstetric History and Physical  Etoy E Prucha is a 27 y.o. [id number] with IUP at [redacted]w[redacted]d being admitted for induction of labor due to severe preeclampsia (BP and HA criteria).  She had severe range BPs at home which were reported to [facility] and severe headache not alleviated by Tylenol, also reported blurry vision. She was diagnosed with preeclampsia a few days ago due to mild range BPs and proteinuria, had reassuring CBC and CMP on 01/12/23.  She was told to come in for evaluation. In MAU here, she continued to have severe range BPs and endorsed a throbbing headache and continued blurry vision. Also reported chest pain that worsens with movement, no SOB, no radiation of the chest pain.  Patient states she has been having  no contractions,  no  vaginal bleeding, no leaking of fluid with active fetal movement.    Prenatal Course Source of Care: [facility]  with onset of care at 10 weeks Pregnancy complications or risks: Patient Active Problem List   Diagnosis Date Noted   Severe preeclampsia, third trimester 01/15/2023   Maternal morbid obesity, antepartum (HCC) 11/29/2022   Supervision of high risk pregnancy in third trimester 07/05/2022   Other iron deficiency anemias 04/30/2015   Nursing Staff Provider  Office Location [facility] Dating  02/16/2023, by 5.6 week Ultrasound (twin GS on early [place]. No FP in second GS)  PNC Model Arly.Keller ] Traditional [ ]  Centering [ ]  Mom-Baby Dyad Anatomy US  nml  Language  English    Flu Vaccine  Give at New ob visit Genetic/Carrier Screen  NIPS:   LR female AFP:   NEG Horizon: [place] alpha thal  TDaP Vaccine   Given [id number] Hgb A1C or  GTT Early  Third trimester Nml 2hr GTT  COVID Vaccine Only one dose   LAB RESULTS   Rhogam  --/--/O POS (03/25 1737)  Blood Type --/--/O POS (03/25 1737)   Baby Feeding Plan Breast Antibody NEG (03/25 1737)  Contraception Yes, undecided on method Rubella 6.39 (01/05 1246)  Circumcision Yes RPR Non Reactive (05/14 0911)   Pediatrician  [facility] HBsAg Negative (01/05 1246)   Support Person Mother and FOB HCVAb Non Reactive (01/05 1246)     HIV Non Reactive (05/14 0911)       GBS  Pending    Pap Diagnosis  Date Value Ref Range Status  07/22/2022      - Negative for Intraepithelial Lesions or Malignancy (NILM)  07/22/2022 - Benign reactive/reparative changes         Past Medical History:  Diagnosis Date   Bleeding hemorrhoid 10/10/2022   Internal hemorrhoids   Chlamydia 01/2016   Gonorrhea 10/2013   Hypertension    Iron deficiency anemia 04/30/2015   Preeclampsia    Syphilis    UTI (urinary tract infection)     Past Surgical History:  Procedure Laterality Date   Chin surgery     FLEXIBLE SIGMOIDOSCOPY N/A 10/11/2022   Procedure: FLEXIBLE SIGMOIDOSCOPY;  Surgeon: Lynann Bologna, MD;  Location: [facility];  Service: Gastroenterology;  Laterality: N/A;   HAND SURGERY     Left  hand    OB History  Gravida Para Term Preterm AB Living  4 2 2  0 1 2  SAB IAB Ectopic Multiple Live Births  1 0 0 0 2    # Outcome Date GA Lbr Len/2nd Weight Sex Delivery Anes PTL Lv  4 Current           3 Term 07/02/15 [redacted]w[redacted]d  09:15 / 01:45 3075 g M Vag-Spont EPI  LIV  2 Term 06/07/14 [redacted]w[redacted]d 15:32 / 01:19 3320 g M Vag-Spont EPI  LIV  1 SAB 2014            Social History   Socioeconomic History   Marital status: Single    Spouse name: Not on file   Number of children: Not on file   Years of education: Not on file   Highest education level: Not on file  Occupational History   Not on file  Tobacco Use   Smoking status: Never    Passive exposure: Never   Smokeless tobacco: Never  Vaping Use   Vaping Use: Never used  Substance and Sexual Activity   Alcohol use: No    Alcohol/week: 0.0 standard drinks of alcohol   Drug use: No   Sexual activity: Yes    Partners: Male    Birth control/protection: None  Other Topics Concern   Not on file  Social History Narrative   Not on file   Social Determinants of Health    Financial Resource Strain: Not on file  Food Insecurity: Food Insecurity Present (10/10/2022)   Hunger Vital Sign    Worried About Running Out of Food in the Last Year: Sometimes true    Ran Out of Food in the Last Year: Sometimes true  Transportation Needs: No Transportation Needs (10/10/2022)   PRAPARE - Administrator, Civil Service (Medical): No    Lack of Transportation (Non-Medical): No  Physical Activity: Not on file  Stress: Not on file  Social Connections: Not on file    Family History  Adopted: Yes  Problem Relation Age of Onset   Lung disease Mother    Depression Mother    Heart disease Father    Diabetes Father    Hypertension Father    Learning disabilities Neg Hx    Mental illness Neg Hx    Mental retardation Neg Hx    Vision loss Neg Hx    Varicose Veins Neg Hx    Asthma Neg Hx     Medications Prior to Admission  Medication Sig Dispense Refill Last Dose   aspirin EC 81 MG tablet Take 1 tablet (81 mg total) by mouth at bedtime. Start taking for rest of pregnancy for prevention of preeclampsia 300 tablet 2 01/15/2023   ferrous sulfate 325 (65 FE) MG EC tablet Take 1 tablet (325 mg total) by mouth 2 (two) times daily. 60 tablet 3 01/14/2023   Prenatal Vit-Fe Fumarate-FA (PRENATAL VITAMIN PLUS LOW IRON) 27-1 MG TABS Take 1 tablet by mouth daily. 30 tablet 12 01/14/2023   acetaminophen (TYLENOL) 500 MG tablet Take 2 tablets (1,000 mg total) by mouth every 6 (six) hours as needed. 30 tablet 0    Blood Pressure Monitoring (BLOOD PRESSURE KIT) DEVI 1 kit by Does not apply route once a week. (Patient not taking: Reported on 12/28/2022) 1 each 0    cyclobenzaprine (FLEXERIL) 10 MG tablet Take 1 tablet (10 mg total) by mouth every 8 (eight) hours as needed for muscle spasms. (Patient not taking: Reported on 01/02/2023) 30 tablet 1     No Known Allergies  Review of Systems: Negative except for what is mentioned in HPI.  Physical Exam: BP (!) 164/93   Pulse 72    Temp 98.1 F (36.7 C) (Oral)   Resp 20   Ht 5\' 1"  (1.549 m)   Wt 128.8 kg   LMP 05/06/2022 (Exact Date)  SpO2 100%   BMI 53.66 kg/m  Patient Vitals for the past 24 hrs:  BP Temp Temp src Pulse Resp SpO2 Height Weight  01/15/23 2335 (!) 164/93 -- -- 72 -- 100 % -- --  01/15/23 2245 (!) 175/93 -- -- 79 -- 100 % -- --  01/15/23 2227 (!) 178/97 98.1 F (36.7 C) Oral 66 20 98 % 5\' 1"  (1.549 m) 128.8 kg   CONSTITUTIONAL: Well-developed, well-nourished female in no acute distress.  HENT:  Normocephalic, atraumatic, External right and left ear normal. Oropharynx is clear and moist EYES: Conjunctivae and EOM are normal. Pupils are equal, round, and reactive to light. No scleral icterus.  NECK: Normal range of motion, supple, no masses SKIN: Skin is warm and dry. No rash noted. Not diaphoretic. No erythema. No pallor. NEUROLOGIC: Alert and oriented to person, place, and time. Normal reflexes, muscle tone coordination. No cranial nerve deficit noted. PSYCHIATRIC: Normal mood and affect. Normal behavior. Normal judgment and thought content. CARDIOVASCULAR: Normal heart rate noted, regular rhythm RESPIRATORY: Effort and breath sounds normal, no problems with respiration noted ABDOMEN: Soft, obese, nontender, nondistended, gravid. MUSCULOSKELETAL: Normal range of motion. No edema and no tenderness. 2+ distal pulses. 3+DTRs.  Cervical Exam: Dilatation 0.5 cm   Effacement 0%   Station [facility]   Presentation: cephalic (verified on bedside ultrasound) FHT:  Baseline rate 140 bpm   Variability moderate  Accelerations present   Decelerations none Contractions: Rare  EKG in MAU: NSR.    Pertinent Labs/Studies:   Results for orders placed or performed during the hospital encounter of 01/15/23 (from the past 24 hour(s))  CBC with Differential/Platelet     Status: Abnormal   Collection Time: 01/15/23 10:25 PM  Result Value Ref Range   WBC 8.9 4.0 - 10.5 K/uL   RBC 3.72 (L) 3.87 - 5.11 MIL/uL    Hemoglobin 10.7 (L) 12.0 - 15.0 g/dL   HCT 41.3 (L) 24.4 - 01.0 %   MCV 86.3 80.0 - 100.0 fL   MCH 28.8 26.0 - 34.0 pg   MCHC 33.3 30.0 - 36.0 g/dL   RDW 27.2 53.6 - 64.4 %   Platelets 251 150 - 400 K/uL   nRBC 0.0 0.0 - 0.2 %   Neutrophils Relative % 65 %   Neutro Abs 5.8 1.7 - 7.7 K/uL   Lymphocytes Relative 27 %   Lymphs Abs 2.4 0.7 - 4.0 K/uL   Monocytes Relative 7 %   Monocytes Absolute 0.6 0.1 - 1.0 K/uL   Eosinophils Relative 1 %   Eosinophils Absolute 0.1 0.0 - 0.5 K/uL   Basophils Relative 0 %   Basophils Absolute 0.0 0.0 - 0.1 K/uL   Immature Granulocytes 0 %   Abs Immature Granulocytes 0.04 0.00 - 0.07 K/uL  Comprehensive metabolic panel     Status: Abnormal   Collection Time: 01/15/23 10:25 PM  Result Value Ref Range   Sodium 137 135 - 145 mmol/L   Potassium 3.9 3.5 - 5.1 mmol/L   Chloride 108 98 - 111 mmol/L   CO2 22 22 - 32 mmol/L   Glucose, Bld 90 70 - 99 mg/dL   BUN 7 6 - 20 mg/dL   Creatinine, Ser 0.34 0.44 - 1.00 mg/dL   Calcium 8.8 (L) 8.9 - 10.3 mg/dL   Total Protein 5.6 (L) 6.5 - 8.1 g/dL   Albumin 2.5 (L) 3.5 - 5.0 g/dL   AST 12 (L) 15 - 41 U/L   ALT 12 0 - 44 U/L  Alkaline Phosphatase 118 38 - 126 U/L   Total Bilirubin 0.3 0.3 - 1.2 mg/dL   GFR, Estimated >16 >10 mL/min   Anion gap 7 5 - 15  Type and screen [facility]     Status: None (Preliminary result)   Collection Time: 01/15/23 11:17 PM  Result Value Ref Range   ABO/RH(D) PENDING    Antibody Screen PENDING    Sample Expiration      01/18/2023,2359 Performed at [facility], [address], [place], [place] [address]    [place] MFM OB FOLLOW UP  Result Date: 01/02/2023 ----------------------------------------------------------------------  OBSTETRICS REPORT                       (Signed Final 01/02/2023 12:11 pm) ---------------------------------------------------------------------- Patient Info  ID #:       [id number]                          D.O.B.:  12/25/1995 (27  yrs)  Name:       DAMAIYA KOHLHOFF                   Visit Date: 01/02/2023 10:39 am ---------------------------------------------------------------------- Performed By  Attending:        Ma Rings MD         Ref. Address:     [address]                                                             [place], [place]                                                             [address]  Performed By:     Marcellina Millin       [facility]                                     [facility]                                                             [facility]  Referred By:      Brock Bad MD ---------------------------------------------------------------------- Orders  #  Description                           Code        Ordered By  1  [place] MFM OB FOLLOW UP                   76816.01    [name]  2  [place] MFM FETAL BPP                      [id number]     Rosana Hoes     W/NONSTRESS ----------------------------------------------------------------------  #  Order #                     Accession #                Episode #  1  [id number]                   [id number]                 [id number]  2  [id number]                   [id number]                 [id number] ---------------------------------------------------------------------- Indications  Obesity complicating pregnancy, third          O99.213  trimester (BMI 44)  Twin pregnancy with anembryonic                O30.009, O31.10X0  gestational sac of one twin, antepartum  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 Cord Insertion:      Previously visualized  Amniotic Fluid  AFI FV:      Within normal limits  AFI Sum(cm)     %Tile       Largest Pocket(cm)  13.39           44          6.56  RUQ(cm)       RLQ(cm)       LUQ(cm)        LLQ(cm)  3.24          2.66          6.56           0.93 ---------------------------------------------------------------------- Biophysical Evaluation  Amniotic F.V:   Pocket => 2 cm  F. Tone:        Observed  F. Movement:    Observed                   N.S.T:          Reactive  F. Breathing:   Not Observed               Score:          8/10 ---------------------------------------------------------------------- Biometry  BPD:      85.8  mm     G. Age:  34w 4d         75  %    CI:         78.5   %    70 - 86                                                          FL/HC:      21.2   %    19.4 - 21.8  HC:      306.3  mm     G. Age:  34w 1d         27  %    HC/AC:      1.07        0.96 - 1.11  AC:      285.3  mm     G. Age:  32w 4d         23  %    FL/BPD:     75.5   %    71 - 87  FL:       64.8  mm     G. Age:  33w 3d         35  %    FL/AC:      22.7   %    20 - 24  Est. FW:    2131  gm    4 lb 11 oz      30  % ---------------------------------------------------------------------- OB History  Gravidity:    4         Term:   2        Prem:   0        SAB:   1  TOP:          0       Ectopic:  0        Living: 2 ---------------------------------------------------------------------- Gestational Age  LMP:           34w 3d        Date:  05/06/22                 EDD:   02/10/23  U/S Today:     33w 5d                                        EDD:   02/15/23  Best:          33w 4d     Det. ByMarcella Dubs         EDD:   02/16/23                                      (  06/22/22)  ---------------------------------------------------------------------- Anatomy  Cranium:               Appears normal         LVOT:                   Previously seen  Cavum:                 Previously seen        Aortic Arch:            Previously seen  Ventricles:            Previously seen        Ductal Arch:            Previously seen  Choroid Plexus:        Previously seen        Diaphragm:              Appears normal  Cerebellum:            Previously seen        Stomach:                Appears normal, left                                                                        sided  Posterior Fossa:       Previously seen        Abdomen:                Previously seen  Nuchal Fold:           Previously seen        Abdominal Wall:         Previously seen  Face:                  Orbits and profile     Cord Vessels:           Previously seen                         previously seen  Lips:                  Previously seen        Kidneys:                Appear normal  Palate:                Previously seen        Bladder:                Appears normal  Thoracic:              Previously seen        Spine:                  Previously seen  Heart:                 Previously seen        Upper Extremities:      Previously seen  RVOT:  Previously seen        Lower Extremities:      Previously seen  Other:  Female gender, Heels/feet, open hands/5th digits, Nasal bone, lenses,          maxilla, mandible, falx, VC, 3VV, and 3VTV visualized previously.          Technically difficult due to maternal habitus. ---------------------------------------------------------------------- Comments  This patient was seen for a follow up growth scan and BPP  due to maternal obesity with a BMI of 44.  She denies any  problems since her last exam.  She was informed that the fetal growth and amniotic fluid  level appears appropriate for her gestational age.  A BPP performed today was 8 out of 10 with a reactive NST.  The fetus  received a -2 for fetal breathing movements that did  not meet criteria.  She will return in 1 week for another BPP. ----------------------------------------------------------------------                   Ma Rings, MD Electronically Signed Final Report   01/02/2023 12:11 pm ----------------------------------------------------------------------  [place] MFM FETAL BPP W/NONSTRESS  Result Date: 01/02/2023 ----------------------------------------------------------------------  OBSTETRICS REPORT                       (Signed Final 01/02/2023 12:11 pm) ---------------------------------------------------------------------- Patient Info  ID #:       [id number]                          D.O.B.:  01-19-1996 (27 yrs)  Name:       [name]                   Visit Date: 01/02/2023 10:39 am ---------------------------------------------------------------------- Performed By  Attending:        Ma Rings MD         Ref. Address:     [address]                                                             [place], [place]                                                             [address]  Performed By:     Marcellina Millin       [facility]  Referred By:      Brock Bad MD ---------------------------------------------------------------------- Orders  #  Description                           Code        Ordered By  1  [place] MFM OB FOLLOW UP                   76816.01    [name]  2  [place] MFM FETAL BPP                      16109.6     YU FANG     W/NONSTRESS ----------------------------------------------------------------------  #  Order #                     Accession #                Episode #  1   [id number]                   [id number]                 [id number]  2  [id number]                   [id number]                 [id number] ---------------------------------------------------------------------- Indications  Obesity complicating pregnancy, third          O99.213  trimester (BMI 44)  Twin pregnancy with anembryonic                O30.009, O31.10X0  gestational sac of one twin, antepartum  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 Cord Insertion:      Previously visualized  Amniotic Fluid  AFI FV:      Within normal limits  AFI Sum(cm)     %Tile       Largest Pocket(cm)  13.39           44          6.56  RUQ(cm)       RLQ(cm)       LUQ(cm)        LLQ(cm)  3.24          2.66          6.56           0.93 ---------------------------------------------------------------------- Biophysical Evaluation  Amniotic F.V:   Pocket => 2 cm             F. Tone:        Observed  F. Movement:    Observed                   N.S.T:          Reactive  F. Breathing:   Not Observed               Score:          8/10 ---------------------------------------------------------------------- Biometry  BPD:      85.8  mm     G. Age:  34w 4d         75  %    CI:         78.5   %    70 - 86                                                          FL/HC:      21.2   %    19.4 - 21.8  HC:      306.3  mm     G. Age:  34w 1d         27  %    HC/AC:      1.07        0.96 - 1.11  AC:      285.3  mm     G. Age:  32w 4d         23  %    FL/BPD:     75.5   %    71 - 87  FL:       64.8  mm     G. Age:  33w 3d          35  %    FL/AC:      22.7   %    20 - 24  Est. FW:    2131  gm    4 lb 11 oz      30  % ---------------------------------------------------------------------- OB History  Gravidity:    4         Term:   2        Prem:   0        SAB:   1  TOP:          0       Ectopic:  0        Living: 2 ---------------------------------------------------------------------- Gestational Age  LMP:           34w 3d  Date:  05/06/22                 EDD:   02/10/23  U/S Today:     33w 5d                                        EDD:   02/15/23  Best:          33w 4d     Det. ByMarcella Dubs         EDD:   02/16/23                                      (06/22/22) ---------------------------------------------------------------------- Anatomy  Cranium:               Appears normal         LVOT:                   Previously seen  Cavum:                 Previously seen        Aortic Arch:            Previously seen  Ventricles:            Previously seen        Ductal Arch:            Previously seen  Choroid Plexus:        Previously seen        Diaphragm:              Appears normal  Cerebellum:            Previously seen        Stomach:                Appears normal, left                                                                        sided  Posterior Fossa:       Previously seen        Abdomen:                Previously seen  Nuchal Fold:           Previously seen        Abdominal Wall:         Previously seen  Face:                  Orbits and profile     Cord Vessels:           Previously seen                         previously seen  Lips:                  Previously seen        Kidneys:  Appear normal  Palate:                Previously seen        Bladder:                Appears normal  Thoracic:              Previously seen        Spine:                  Previously seen  Heart:                 Previously seen        Upper Extremities:      Previously seen  RVOT:                  Previously seen         Lower Extremities:      Previously seen  Other:  Female gender, Heels/feet, open hands/5th digits, Nasal bone, lenses,          maxilla, mandible, falx, VC, 3VV, and 3VTV visualized previously.          Technically difficult due to maternal habitus. ---------------------------------------------------------------------- Comments  This patient was seen for a follow up growth scan and BPP  due to maternal obesity with a BMI of 44.  She denies any  problems since her last exam.  She was informed that the fetal growth and amniotic fluid  level appears appropriate for her gestational age.  A BPP performed today was 8 out of 10 with a reactive NST.  The fetus received a -2 for fetal breathing movements that did  not meet criteria.  She will return in 1 week for another BPP. ----------------------------------------------------------------------                   Ma Rings, MD Electronically Signed Final Report   01/02/2023 12:11 pm ----------------------------------------------------------------------   Assessment : Nilda Calamity Gossman is a 27 y.o. [id number] at [redacted]w[redacted]d being admitted for induction of labor due to severe preeclampsia by BP and HA criteria.  Plan: Labor: Induction as ordered as per protocol to start with misoprostol. Analgesia as needed. Severe BP: Started on Nifedipine protocol.  Magnesium sulfate infusion ordered for eclampsia prophylaxis.  Will give analgesia as needed for headache.  Will continue to monitor closely. Chest pain: EKG with NSR. Troponin and BNP pending.  Mild pain reported while in MAU. Will continue to monitor closely.  FWB: Category I fetal heart tracing.  GBS pending, started on PCN due to prematurity.  Neonatology team informed of this admission. Delivery plan: Hopeful for vaginal delivery   Jaynie Collins, MD, FACOG Obstetrician & Gynecologist, [facility] for [employer], [facility]

## 2023-01-15 NOTE — Telephone Encounter (Addendum)
     Faculty Practice OB/GYN Physician [employer] Phone Call Documentation  There was a [employer] notification of elevated BPs of 165/100 then 144/94 for [facility] E Bruer who is a [id number] at [redacted]w[redacted]d with known preeclampsia (normal labs on 01/12/23  except urine PCR of 0.32). There was a report of severe headaches.   I called Shifra E Drennon at home, and after verifying two patient identifiers, asked about this BP.  Repeat BP was 155/90s as per patient. Patient reports continued moderate throbbing headache  not alleviated after taking Tylenol 1000 mg.Also reports having blurry vision all day. No RUQ/epigastric pain or other concerning symptoms. She was told to go the [facility]U for evaluation and management given concern about development for severe preeclampsia, stressed importance of evaluating her and her fetus.  She reported she has an antenatal screening appointment with MFM tomorrow; but it was emphasized that she come in tonight given concern about her and her baby.  She said she "will try to come in tonight". MAU providers and staff notified.   Jaynie Collins, MD, FACOG Obstetrician & Gynecologist, [facility] for [employer], [facility]

## 2023-01-15 NOTE — MAU Note (Signed)
.  Nicole Mclean is a 27 y.o. at [redacted]w[redacted]d here in MAU reporting: dizziness all day. Took BP at 2135 - 179/112. HA all day, but the last hour is has gotten worse. Vision is blurry in and out and chest pain in the middle of chest - uncomfortable and sharpens when moving. Took tylenol at 1800 with no relief. Denies ctx, VB, or LOF. +FM   Pain score: 6 -chest; 8 - HA  Vitals:   01/15/23 2227  BP: (!) 178/97  Pulse: 66  Resp: 20  Temp: 98.1 F (36.7 C)  SpO2: 98%     FHT:140 Lab orders placed from triage:  MD placed orders.

## 2023-01-16 ENCOUNTER — Encounter: Payer: Self-pay | Admitting: Obstetrics & Gynecology

## 2023-01-16 ENCOUNTER — Ambulatory Visit: Payer: No Typology Code available for payment source

## 2023-01-16 ENCOUNTER — Encounter (HOSPITAL_COMMUNITY): Payer: Self-pay | Admitting: Obstetrics & Gynecology

## 2023-01-16 LAB — COMPREHENSIVE METABOLIC PANEL
ALT: 14 U/L (ref 0–44)
AST: 14 U/L — ABNORMAL LOW (ref 15–41)
Albumin: 2.8 g/dL — ABNORMAL LOW (ref 3.5–5.0)
Alkaline Phosphatase: 129 U/L — ABNORMAL HIGH (ref 38–126)
Anion gap: 14 (ref 5–15)
BUN: 6 mg/dL (ref 6–20)
CO2: 22 mmol/L (ref 22–32)
Calcium: 8.8 mg/dL — ABNORMAL LOW (ref 8.9–10.3)
Chloride: 98 mmol/L (ref 98–111)
Creatinine, Ser: 0.54 mg/dL (ref 0.44–1.00)
GFR, Estimated: >60 mL/min (ref >60)
Glucose, Bld: 92 mg/dL (ref 70–99)
Potassium: 3.6 mmol/L (ref 3.5–5.1)
Sodium: 134 mmol/L — ABNORMAL LOW (ref 135–145)
Total Bilirubin: 0.2 mg/dL — ABNORMAL LOW (ref 0.3–1.2)
Total Protein: 6.4 g/dL — ABNORMAL LOW (ref 6.5–8.1)

## 2023-01-16 LAB — CBC
HCT: 36.1 % (ref 36.0–46.0)
HCT: 36.4 % (ref 36.0–46.0)
Hemoglobin: 11.8 g/dL — ABNORMAL LOW (ref 12.0–15.0)
Hemoglobin: 12.2 g/dL (ref 12.0–15.0)
MCH: 28.4 pg (ref 26.0–34.0)
MCH: 29.5 pg (ref 26.0–34.0)
MCHC: 32.7 g/dL (ref 30.0–36.0)
MCHC: 33.5 g/dL (ref 30.0–36.0)
MCV: 87 fL (ref 80.0–100.0)
MCV: 87.9 fL (ref 80.0–100.0)
Platelets: 265 10*3/uL (ref 150–400)
Platelets: 290 10*3/uL (ref 150–400)
RBC: 4.15 MIL/uL (ref 3.87–5.11)
RBC: 4.14 MIL/uL (ref 3.87–5.11)
RDW: 13.8 % (ref 11.5–15.5)
RDW: 14 % (ref 11.5–15.5)
WBC: 11.9 10*3/uL — ABNORMAL HIGH (ref 4.0–10.5)
WBC: 11.5 10*3/uL — ABNORMAL HIGH (ref 4.0–10.5)
nRBC: 0 % (ref 0.0–0.2)
nRBC: 0 % (ref 0.0–0.2)

## 2023-01-16 LAB — TYPE AND SCREEN
ABO/RH(D): O POS
Antibody Screen: NEGATIVE

## 2023-01-16 LAB — GC/CHLAMYDIA PROBE AMP (~~LOC~~) NOT AT ARMC
Comment: NEGATIVE
Comment: NORMAL

## 2023-01-16 LAB — PROTEIN / CREATININE RATIO, URINE
Creatinine, Urine: 77 mg/dL
Protein Creatinine Ratio: 1.32 mg/mg{Cre} — ABNORMAL HIGH (ref 0.00–0.15)
Total Protein, Urine: 102 mg/dL

## 2023-01-16 LAB — RPR: RPR Ser Ql: NONREACTIVE

## 2023-01-16 LAB — TROPONIN I (HIGH SENSITIVITY)
Troponin I (High Sensitivity): 24 ng/L — ABNORMAL HIGH (ref <18)
Troponin I (High Sensitivity): 29 ng/L — ABNORMAL HIGH (ref <18)
Troponin I (High Sensitivity): 26 ng/L — ABNORMAL HIGH (ref <18)

## 2023-01-16 LAB — BRAIN NATRIURETIC PEPTIDE
B Natriuretic Peptide: 184.1 pg/mL — ABNORMAL HIGH (ref 0.0–100.0)
B Natriuretic Peptide: 148.4 pg/mL — ABNORMAL HIGH (ref 0.0–100.0)

## 2023-01-16 MED ORDER — NIFEDIPINE ER OSMOTIC RELEASE 60 MG PO TB24
60.0000 mg | ORAL_TABLET | Freq: Every day | ORAL | Status: DC
  Administered 2023-01-17 – 2023-01-20 (×4): 60 mg via ORAL
  Filled 2023-01-16 (×4): qty 1

## 2023-01-16 MED ORDER — PHENYLEPHRINE 80 MCG/ML (10ML) SYRINGE FOR IV PUSH (FOR BLOOD PRESSURE SUPPORT)
80.0000 ug | PREFILLED_SYRINGE | INTRAVENOUS | Status: AC | PRN
  Administered 2023-01-17 (×3): 80 ug via INTRAVENOUS

## 2023-01-16 MED ORDER — DIPHENHYDRAMINE HCL 50 MG/ML IJ SOLN: 12.5000 mg | INTRAMUSCULAR | Status: DC | PRN

## 2023-01-16 MED ORDER — FENTANYL-BUPIVACAINE-NACL 0.5-0.125-0.9 MG/250ML-% EP SOLN
12.0000 mL/h | EPIDURAL | Status: DC | PRN
  Filled 2023-01-16: qty 250

## 2023-01-16 MED ORDER — LACTATED RINGERS IV SOLN: 500.0000 mL | Freq: Once | INTRAVENOUS | Status: DC

## 2023-01-16 MED ORDER — BUTALBITAL-APAP-CAFFEINE 50-325-40 MG PO TABS
2.0000 | ORAL_TABLET | Freq: Four times a day (QID) | ORAL | Status: DC | PRN
  Administered 2023-01-16 (×2): 2 via ORAL
  Filled 2023-01-16 (×2): qty 2

## 2023-01-16 MED ORDER — NIFEDIPINE ER OSMOTIC RELEASE 30 MG PO TB24
30.0000 mg | ORAL_TABLET | Freq: Every day | ORAL | Status: DC
  Administered 2023-01-16: 30 mg via ORAL
  Filled 2023-01-16: qty 1

## 2023-01-16 MED ORDER — PHENYLEPHRINE 80 MCG/ML (10ML) SYRINGE FOR IV PUSH (FOR BLOOD PRESSURE SUPPORT)
80.0000 ug | PREFILLED_SYRINGE | INTRAVENOUS | Status: DC | PRN
  Filled 2023-01-16: qty 10

## 2023-01-16 MED ORDER — EPHEDRINE 5 MG/ML INJ: 10.0000 mg | INTRAVENOUS | Status: DC | PRN

## 2023-01-16 MED ORDER — MISOPROSTOL 50MCG HALF TABLET
50.0000 ug | ORAL_TABLET | ORAL | Status: DC
  Administered 2023-01-16 (×2): 50 ug via BUCCAL
  Filled 2023-01-16 (×2): qty 1

## 2023-01-16 MED ORDER — NIFEDIPINE ER OSMOTIC RELEASE 30 MG PO TB24
30.0000 mg | ORAL_TABLET | Freq: Once | ORAL | Status: AC
  Administered 2023-01-16: 30 mg via ORAL
  Filled 2023-01-16: qty 1

## 2023-01-16 MED ORDER — MISOPROSTOL 25 MCG QUARTER TABLET
25.0000 ug | ORAL_TABLET | Freq: Once | ORAL | Status: AC
  Administered 2023-01-16: 25 ug via BUCCAL
  Filled 2023-01-16: qty 1

## 2023-01-16 MED ORDER — FUROSEMIDE 10 MG/ML IJ SOLN
20.0000 mg | Freq: Once | INTRAMUSCULAR | Status: AC
  Administered 2023-01-16: 20 mg via INTRAVENOUS
  Filled 2023-01-16: qty 2

## 2023-01-16 MED ORDER — SODIUM CHLORIDE 0.9 % IV SOLN
1.0000 g | Freq: Once | INTRAVENOUS | Status: AC
  Administered 2023-01-16: 1 g via INTRAVENOUS
  Filled 2023-01-16: qty 10

## 2023-01-16 NOTE — Progress Notes (Addendum)
Labor Progress Note Nicole Mclean is a 28 y.o. [id number] at [redacted]w[redacted]d presented for IOL due to severe preeclampsia (BP and HA criteria)  BP (!) 151/87   Pulse 79   Temp 97.7 F (36.5 C) (Oral)   Resp 20   Ht 5\' 1"  (1.549 m)   Wt 128.8 kg   LMP 05/06/2022 (Exact Date)   SpO2 98%   BMI 53.66 kg/m    CVE: Dilation: 1 Effacement (%): Thick Cervical Position: Posterior Station: -3 Presentation: Vertex (Verifed on bedside ultrasound) Exam by:: Philipp Deputy, CNM   A&P: 27 y.o. [name] [redacted]w[redacted]d  #Labor: Pt unable to tolerate cervical check due to discomfort, vertex presentation verified on bedside US #FWB: cat 1 #GBS  unknown, PCN prophylaxis given  G/C culture positive, Rocephin ordered   Kirstie Everhart, DO 6:30 PM   I have seen and examined this patient and I agree with the above. Repeat pre-e labs & mag level ordered for the morning. S/p cervical foley recently; Pitocin started after vertex verified.  [name] CNM 6:39 PM 01/16/2023

## 2023-01-16 NOTE — Progress Notes (Signed)
Patient ID: Nicole Mclean, female   DOB: 09-08-1995, 27 y.o.   MRN: [id number]  S/p cytotec x dual dose and buccal x 1; feeling crampy; PCN x few doses; H/A persists although relived some by Tylenol; mag sulfate infusing; chest pain resolved  BPs 152/89, 138/74, 135/80 FHR 125-135, +accels, no decels Ctx irreg Cx 1/long/soft/vtx -3  IUP@35 .4wks Severe pre-e Cx unfavorable  Continue mag sulfate and Procardia XL 30mg ; will order prn Fioricet for H/A Cervical foley inserted without difficulty and inflated with 60cc fluid Plan for Pit 2x2 when foley dislodges  [name] [facility] 01/16/2023

## 2023-01-16 NOTE — MAU Note (Signed)
Pt states she is no longer having chest pain or pressure.  Feels that is was anxiety.

## 2023-01-16 NOTE — Progress Notes (Signed)
Labor Progress Note Alexanderia E Orzech is a 27 y.o. [id number] at [redacted]w[redacted]d presented for IOL due to severe preeclampsia (BP and HA criteria)  BP (!) 148/93   Pulse 81   Temp 97.6 F (36.4 C) (Oral)   Resp 18   Ht 5\' 1"  (1.549 m)   Wt 128.8 kg   LMP 05/06/2022 (Exact Date)   SpO2 98%   BMI 53.66 kg/m  EFM: 120 bpm/moderate/+accels, no decels  CVE: Dilation: 1 Effacement (%): Thick Cervical Position: Posterior Station: -3 Presentation: Vertex (Verifed on bedside ultrasound) Exam by:: Philipp Deputy, CNM   A&P: 27 y.o. [id number] [redacted]w[redacted]d  #Labor: s/p cyto x3, FB, and pit. Discussed AROM 4 hours following CTX which will be at 2300.  #FWB: cat 1 #GBS  unknown, PCN prophylaxis given  G/C culture positive, Rocephin given  [name], DO OB Fellow, Faculty Practice [facility], Center for [facility] 01/16/2023, 10:06 PM

## 2023-01-16 NOTE — Progress Notes (Signed)
Pt with complete chest pain upon admission.  EKG NSR, no obvious ST elevations or T wave inversions.  Patient started on preeclampsia management and admitted for induction of labor.  BNP 184.1, troponin 24.  BP 119/80   Pulse 100   Temp 98.1 F (36.7 C) (Oral)   Resp 20   Ht 5\' 1"  (1.549 m)   Wt 128.8 kg   LMP 05/06/2022 (Exact Date)   SpO2 99%   BMI 53.66 kg/m  GEN: AxO x 4, well-appearing Heart: S1/S2, no obvious murmurs or gallops Lung: CTA bilaterally Abdomen: Gravid LE: 1 pitting edema bilaterally  A/P: Chest pain: - Telemetry, IV Lasix x 1, repeat BNP and troponin. -Consider stat repeat EKG, CXR and calling cardiology if levels increase or if symptoms worsen for concern of NSTEMI. -Continue home nifedipine 30 mg XL -Decrease IVF infusion to 100 cc/hr  Preeclampsia: - S/p magnesium IVB 4g, ongoing magnesium gtt. _Nifedipine/labetalol protocol

## 2023-01-16 NOTE — Plan of Care (Signed)
  Problem: Education: Goal: Knowledge of disease or condition will improve Outcome: Progressing Goal: Knowledge of the prescribed therapeutic regimen will improve Outcome: Progressing   Problem: Fluid Volume: Goal: Peripheral tissue perfusion will improve Outcome: Progressing   Problem: Clinical Measurements: Goal: Complications related to disease process, condition or treatment will be avoided or minimized Outcome: Progressing   Problem: Education: Goal: Knowledge of Childbirth will improve Outcome: Progressing Goal: Ability to make informed decisions regarding treatment and plan of care will improve Outcome: Progressing Goal: Ability to state and carry out methods to decrease the pain will improve Outcome: Progressing Goal: Individualized Educational Video(s) Outcome: Progressing   Problem: Coping: Goal: Ability to verbalize concerns and feelings about labor and delivery will improve Outcome: Progressing   Problem: Life Cycle: Goal: Ability to make normal progression through stages of labor will improve Outcome: Progressing Goal: Ability to effectively push during vaginal delivery will improve Outcome: Progressing   Problem: Role Relationship: Goal: Will demonstrate positive interactions with the child Outcome: Progressing   Problem: Safety: Goal: Risk of complications during labor and delivery will decrease Outcome: Progressing   Problem: Pain Management: Goal: Relief or control of pain from uterine contractions will improve Outcome: Progressing   Problem: Education: Goal: Knowledge of General Education information will improve Description: Including pain rating scale, medication(s)/side effects and non-pharmacologic comfort measures Outcome: Progressing   Problem: Health Behavior/Discharge Planning: Goal: Ability to manage health-related needs will improve Outcome: Progressing   Problem: Clinical Measurements: Goal: Ability to maintain clinical measurements  within normal limits will improve Outcome: Progressing Goal: Will remain free from infection Outcome: Progressing Goal: Diagnostic test results will improve Outcome: Progressing Goal: Respiratory complications will improve Outcome: Progressing Goal: Cardiovascular complication will be avoided Outcome: Progressing   Problem: Activity: Goal: Risk for activity intolerance will decrease Outcome: Progressing   Problem: Nutrition: Goal: Adequate nutrition will be maintained Outcome: Progressing   Problem: Coping: Goal: Level of anxiety will decrease Outcome: Progressing   Problem: Elimination: Goal: Will not experience complications related to bowel motility Outcome: Progressing Goal: Will not experience complications related to urinary retention Outcome: Progressing   Problem: Pain Managment: Goal: General experience of comfort will improve Outcome: Progressing   Problem: Safety: Goal: Ability to remain free from injury will improve Outcome: Progressing   Problem: Skin Integrity: Goal: Risk for impaired skin integrity will decrease Outcome: Progressing   

## 2023-01-17 ENCOUNTER — Ambulatory Visit: Payer: No Typology Code available for payment source

## 2023-01-17 ENCOUNTER — Inpatient Hospital Stay (HOSPITAL_COMMUNITY): Payer: No Typology Code available for payment source | Admitting: Anesthesiology

## 2023-01-17 ENCOUNTER — Encounter (HOSPITAL_COMMUNITY): Payer: Self-pay | Admitting: Obstetrics & Gynecology

## 2023-01-17 ENCOUNTER — Other Ambulatory Visit: Payer: Self-pay

## 2023-01-17 ENCOUNTER — Encounter (HOSPITAL_COMMUNITY)
Admission: AD | Disposition: A | Discharge status: Home / Self Care | Payer: Self-pay | Source: Home / Self Care | Attending: Obstetrics & Gynecology

## 2023-01-17 DIAGNOSIS — O99214 Obesity complicating childbirth: Secondary | ICD-10-CM

## 2023-01-17 DIAGNOSIS — Z3A35 35 weeks gestation of pregnancy: Secondary | ICD-10-CM

## 2023-01-17 DIAGNOSIS — O1414 Severe pre-eclampsia complicating childbirth: Secondary | ICD-10-CM

## 2023-01-17 DIAGNOSIS — Z98891 History of uterine scar from previous surgery: Secondary | ICD-10-CM

## 2023-01-17 LAB — COMPREHENSIVE METABOLIC PANEL
ALT: 14 U/L (ref 0–44)
AST: 16 U/L (ref 15–41)
Albumin: 2.5 g/dL — ABNORMAL LOW (ref 3.5–5.0)
Alkaline Phosphatase: 115 U/L (ref 38–126)
Anion gap: 10 (ref 5–15)
BUN: <5 mg/dL — ABNORMAL LOW (ref 6–20)
CO2: 21 mmol/L — ABNORMAL LOW (ref 22–32)
Calcium: 7.4 mg/dL — ABNORMAL LOW (ref 8.9–10.3)
Chloride: 101 mmol/L (ref 98–111)
Creatinine, Ser: 0.62 mg/dL (ref 0.44–1.00)
GFR, Estimated: >60 mL/min (ref >60)
Glucose, Bld: 133 mg/dL — ABNORMAL HIGH (ref 70–99)
Potassium: 3.7 mmol/L (ref 3.5–5.1)
Sodium: 132 mmol/L — ABNORMAL LOW (ref 135–145)
Total Bilirubin: 0.2 mg/dL — ABNORMAL LOW (ref 0.3–1.2)
Total Protein: 5.8 g/dL — ABNORMAL LOW (ref 6.5–8.1)

## 2023-01-17 LAB — CBC
HCT: 30.3 % — ABNORMAL LOW (ref 36.0–46.0)
HCT: 33 % — ABNORMAL LOW (ref 36.0–46.0)
HCT: 35.2 % — ABNORMAL LOW (ref 36.0–46.0)
Hemoglobin: 10.1 g/dL — ABNORMAL LOW (ref 12.0–15.0)
Hemoglobin: 10.7 g/dL — ABNORMAL LOW (ref 12.0–15.0)
Hemoglobin: 11.5 g/dL — ABNORMAL LOW (ref 12.0–15.0)
MCH: 28.3 pg (ref 26.0–34.0)
MCH: 28.7 pg (ref 26.0–34.0)
MCH: 29.5 pg (ref 26.0–34.0)
MCHC: 32.4 g/dL (ref 30.0–36.0)
MCHC: 32.7 g/dL (ref 30.0–36.0)
MCHC: 33.3 g/dL (ref 30.0–36.0)
MCV: 86.7 fL (ref 80.0–100.0)
MCV: 88.5 fL (ref 80.0–100.0)
MCV: 88.6 fL (ref 80.0–100.0)
Platelets: 253 10*3/uL (ref 150–400)
Platelets: 282 10*3/uL (ref 150–400)
Platelets: 284 10*3/uL (ref 150–400)
RBC: 3.42 MIL/uL — ABNORMAL LOW (ref 3.87–5.11)
RBC: 3.73 MIL/uL — ABNORMAL LOW (ref 3.87–5.11)
RBC: 4.06 MIL/uL (ref 3.87–5.11)
RDW: 14 % (ref 11.5–15.5)
RDW: 14.1 % (ref 11.5–15.5)
RDW: 14.1 % (ref 11.5–15.5)
WBC: 11.1 10*3/uL — ABNORMAL HIGH (ref 4.0–10.5)
WBC: 14.9 10*3/uL — ABNORMAL HIGH (ref 4.0–10.5)
WBC: 16.6 10*3/uL — ABNORMAL HIGH (ref 4.0–10.5)
nRBC: 0 % (ref 0.0–0.2)
nRBC: 0 % (ref 0.0–0.2)
nRBC: 0 % (ref 0.0–0.2)

## 2023-01-17 LAB — TROPONIN I (HIGH SENSITIVITY): Troponin I (High Sensitivity): 16 ng/L (ref <18)

## 2023-01-17 LAB — BRAIN NATRIURETIC PEPTIDE: B Natriuretic Peptide: 47.5 pg/mL (ref 0.0–100.0)

## 2023-01-17 LAB — MAGNESIUM
Magnesium: 4.5 mg/dL — ABNORMAL HIGH (ref 1.7–2.4)
Magnesium: 4.9 mg/dL — ABNORMAL HIGH (ref 1.7–2.4)

## 2023-01-17 SURGERY — Surgical Case
Anesthesia: Epidural

## 2023-01-17 MED ORDER — SODIUM CHLORIDE 0.9 % IV SOLN: 500.0000 mg | INTRAVENOUS | Status: DC

## 2023-01-17 MED ORDER — DIPHENHYDRAMINE HCL 25 MG PO CAPS
25.0000 mg | ORAL_CAPSULE | Freq: Four times a day (QID) | ORAL | Status: DC | PRN
  Administered 2023-01-17: 25 mg via ORAL
  Filled 2023-01-17: qty 1

## 2023-01-17 MED ORDER — SODIUM CHLORIDE 0.9 % IV SOLN
INTRAVENOUS | Status: DC | PRN
  Administered 2023-01-17: 500 mg via INTRAVENOUS

## 2023-01-17 MED ORDER — ACETAMINOPHEN 500 MG PO TABS
1000.0000 mg | ORAL_TABLET | Freq: Four times a day (QID) | ORAL | Status: DC
  Administered 2023-01-17 – 2023-01-20 (×11): 1000 mg via ORAL
  Filled 2023-01-17 (×12): qty 2

## 2023-01-17 MED ORDER — ONDANSETRON HCL 4 MG/2ML IJ SOLN
INTRAMUSCULAR | Status: DC | PRN
  Administered 2023-01-17: 4 mg via INTRAVENOUS

## 2023-01-17 MED ORDER — DIBUCAINE (PERIANAL) 1 % EX OINT: 1.0000 | TOPICAL_OINTMENT | CUTANEOUS | Status: DC | PRN

## 2023-01-17 MED ORDER — FUROSEMIDE 20 MG PO TABS
20.0000 mg | ORAL_TABLET | Freq: Two times a day (BID) | ORAL | Status: DC
  Administered 2023-01-17 – 2023-01-20 (×7): 20 mg via ORAL
  Filled 2023-01-17 (×7): qty 1

## 2023-01-17 MED ORDER — MEASLES, MUMPS & RUBELLA VAC IJ SOLR: 0.5000 mL | Freq: Once | INTRAMUSCULAR | Status: DC

## 2023-01-17 MED ORDER — IBUPROFEN 600 MG PO TABS
600.0000 mg | ORAL_TABLET | Freq: Four times a day (QID) | ORAL | Status: DC
  Administered 2023-01-18 – 2023-01-20 (×9): 600 mg via ORAL
  Filled 2023-01-17 (×9): qty 1

## 2023-01-17 MED ORDER — CEFAZOLIN SODIUM-DEXTROSE 2-3 GM-%(50ML) IV SOLR
INTRAVENOUS | Status: DC | PRN
  Administered 2023-01-17: 3 g via INTRAVENOUS

## 2023-01-17 MED ORDER — GABAPENTIN 300 MG PO CAPS
300.0000 mg | ORAL_CAPSULE | Freq: Two times a day (BID) | ORAL | Status: DC
  Administered 2023-01-17 – 2023-01-20 (×7): 300 mg via ORAL
  Filled 2023-01-17 (×7): qty 1

## 2023-01-17 MED ORDER — OXYCODONE HCL 5 MG/5ML PO SOLN: 5.0000 mg | Freq: Once | ORAL | Status: DC | PRN

## 2023-01-17 MED ORDER — KETOROLAC TROMETHAMINE 30 MG/ML IJ SOLN
30.0000 mg | Freq: Once | INTRAMUSCULAR | Status: AC | PRN
  Administered 2023-01-17: 30 mg via INTRAVENOUS

## 2023-01-17 MED ORDER — MORPHINE SULFATE (PF) 0.5 MG/ML IJ SOLN
INTRAMUSCULAR | Status: AC
  Filled 2023-01-17: qty 10

## 2023-01-17 MED ORDER — HYDROMORPHONE HCL 1 MG/ML IJ SOLN
0.2500 mg | INTRAMUSCULAR | Status: DC | PRN
  Administered 2023-01-17: 0.5 mg via INTRAVENOUS

## 2023-01-17 MED ORDER — HYDROMORPHONE HCL 1 MG/ML IJ SOLN
INTRAMUSCULAR | Status: AC
  Filled 2023-01-17: qty 0.5

## 2023-01-17 MED ORDER — WITCH HAZEL-GLYCERIN EX PADS: 1.0000 | MEDICATED_PAD | CUTANEOUS | Status: DC | PRN

## 2023-01-17 MED ORDER — ENOXAPARIN SODIUM 80 MG/0.8ML IJ SOSY
70.0000 mg | PREFILLED_SYRINGE | INTRAMUSCULAR | Status: DC
  Administered 2023-01-18 – 2023-01-20 (×3): 70 mg via SUBCUTANEOUS
  Filled 2023-01-17 (×3): qty 0.8

## 2023-01-17 MED ORDER — OXYCODONE HCL 5 MG PO TABS
5.0000 mg | ORAL_TABLET | ORAL | Status: DC | PRN
  Filled 2023-01-17: qty 1

## 2023-01-17 MED ORDER — MEPERIDINE HCL 25 MG/ML IJ SOLN
INTRAMUSCULAR | Status: DC | PRN
  Administered 2023-01-17 (×2): 12.5 mg via INTRAVENOUS

## 2023-01-17 MED ORDER — STERILE WATER FOR IRRIGATION IR SOLN
Status: DC | PRN
  Administered 2023-01-17: 1

## 2023-01-17 MED ORDER — FENTANYL-BUPIVACAINE-NACL 0.5-0.125-0.9 MG/250ML-% EP SOLN
EPIDURAL | Status: DC | PRN
  Administered 2023-01-17: 12 mL/h via EPIDURAL

## 2023-01-17 MED ORDER — PHENYLEPHRINE HCL-NACL 20-0.9 MG/250ML-% IV SOLN
INTRAVENOUS | Status: DC | PRN
  Administered 2023-01-17: 60 ug/min via INTRAVENOUS

## 2023-01-17 MED ORDER — DEXAMETHASONE SODIUM PHOSPHATE 10 MG/ML IJ SOLN
INTRAMUSCULAR | Status: DC | PRN
  Administered 2023-01-17: 10 mg via INTRAVENOUS

## 2023-01-17 MED ORDER — FERROUS SULFATE 325 (65 FE) MG PO TABS
325.0000 mg | ORAL_TABLET | ORAL | Status: DC
  Administered 2023-01-18 – 2023-01-20 (×2): 325 mg via ORAL
  Filled 2023-01-17 (×2): qty 1

## 2023-01-17 MED ORDER — OXYTOCIN-SODIUM CHLORIDE 30-0.9 UT/500ML-% IV SOLN
INTRAVENOUS | Status: DC | PRN
  Administered 2023-01-17: 300 mL via INTRAVENOUS

## 2023-01-17 MED ORDER — SIMETHICONE 80 MG PO CHEW: 80.0000 mg | CHEWABLE_TABLET | ORAL | Status: DC | PRN

## 2023-01-17 MED ORDER — KETOROLAC TROMETHAMINE 30 MG/ML IJ SOLN
30.0000 mg | Freq: Four times a day (QID) | INTRAMUSCULAR | Status: AC
  Administered 2023-01-17 – 2023-01-18 (×4): 30 mg via INTRAVENOUS
  Filled 2023-01-17 (×4): qty 1

## 2023-01-17 MED ORDER — TRANEXAMIC ACID-NACL 1000-0.7 MG/100ML-% IV SOLN
INTRAVENOUS | Status: DC | PRN
  Administered 2023-01-17: 1000 mg via INTRAVENOUS

## 2023-01-17 MED ORDER — MAGNESIUM HYDROXIDE 400 MG/5ML PO SUSP: 30.0000 mL | ORAL | Status: DC | PRN

## 2023-01-17 MED ORDER — TRANEXAMIC ACID-NACL 1000-0.7 MG/100ML-% IV SOLN: 1000.0000 mg | INTRAVENOUS | Status: DC

## 2023-01-17 MED ORDER — ZOLPIDEM TARTRATE 5 MG PO TABS: 5.0000 mg | ORAL_TABLET | Freq: Every evening | ORAL | Status: DC | PRN

## 2023-01-17 MED ORDER — MORPHINE SULFATE (PF) 0.5 MG/ML IJ SOLN
INTRAMUSCULAR | Status: DC | PRN
  Administered 2023-01-17: 3 mg via EPIDURAL

## 2023-01-17 MED ORDER — PRENATAL MULTIVITAMIN CH
1.0000 | ORAL_TABLET | Freq: Every day | ORAL | Status: DC
  Administered 2023-01-17 – 2023-01-20 (×4): 1 via ORAL
  Filled 2023-01-17 (×4): qty 1

## 2023-01-17 MED ORDER — MENTHOL 3 MG MT LOZG: 1.0000 | LOZENGE | OROMUCOSAL | Status: DC | PRN

## 2023-01-17 MED ORDER — KETOROLAC TROMETHAMINE 30 MG/ML IJ SOLN
INTRAMUSCULAR | Status: AC
  Filled 2023-01-17: qty 1

## 2023-01-17 MED ORDER — SODIUM CHLORIDE 0.9 % IR SOLN
Status: DC | PRN
  Administered 2023-01-17: 100 mL

## 2023-01-17 MED ORDER — FENTANYL CITRATE (PF) 100 MCG/2ML IJ SOLN
INTRAMUSCULAR | Status: DC | PRN
  Administered 2023-01-17: 100 ug via EPIDURAL

## 2023-01-17 MED ORDER — HYDROMORPHONE HCL 2 MG PO TABS: 2.0000 mg | ORAL_TABLET | ORAL | Status: DC | PRN

## 2023-01-17 MED ORDER — LACTATED RINGERS IV SOLN: INTRAVENOUS | Status: DC | PRN

## 2023-01-17 MED ORDER — COCONUT OIL OIL: 1.0000 | TOPICAL_OIL | Status: DC | PRN

## 2023-01-17 MED ORDER — MAGNESIUM SULFATE 40 GM/1000ML IV SOLN
2.0000 g/h | INTRAVENOUS | Status: AC
  Administered 2023-01-17 (×2): 2 g/h via INTRAVENOUS
  Filled 2023-01-17: qty 1000

## 2023-01-17 MED ORDER — TETANUS-DIPHTH-ACELL PERTUSSIS 5-2.5-18.5 LF-MCG/0.5 IM SUSY
0.5000 mL | PREFILLED_SYRINGE | Freq: Once | INTRAMUSCULAR | Status: DC
  Filled 2023-01-17: qty 0.5

## 2023-01-17 MED ORDER — SENNOSIDES-DOCUSATE SODIUM 8.6-50 MG PO TABS
2.0000 | ORAL_TABLET | Freq: Every day | ORAL | Status: DC
  Administered 2023-01-18 – 2023-01-20 (×3): 2 via ORAL
  Filled 2023-01-17 (×3): qty 2

## 2023-01-17 MED ORDER — MEPERIDINE HCL 25 MG/ML IJ SOLN: 6.2500 mg | INTRAMUSCULAR | Status: DC | PRN

## 2023-01-17 MED ORDER — LIDOCAINE HCL (PF) 1 % IJ SOLN
INTRAMUSCULAR | Status: DC | PRN
  Administered 2023-01-17: 5 mL via EPIDURAL

## 2023-01-17 MED ORDER — CEFAZOLIN IN SODIUM CHLORIDE 3-0.9 GM/100ML-% IV SOLN
3.0000 g | INTRAVENOUS | Status: DC
  Filled 2023-01-17: qty 100

## 2023-01-17 MED ORDER — LIDOCAINE-EPINEPHRINE (PF) 2 %-1:200000 IJ SOLN
INTRAMUSCULAR | Status: DC | PRN
  Administered 2023-01-17: 5 mL via EPIDURAL

## 2023-01-17 MED ORDER — FENTANYL CITRATE (PF) 100 MCG/2ML IJ SOLN
INTRAMUSCULAR | Status: AC
  Filled 2023-01-17: qty 2

## 2023-01-17 MED ORDER — PHENYLEPHRINE 80 MCG/ML (10ML) SYRINGE FOR IV PUSH (FOR BLOOD PRESSURE SUPPORT)
PREFILLED_SYRINGE | INTRAVENOUS | Status: DC | PRN
  Administered 2023-01-17 (×2): 80 ug via INTRAVENOUS

## 2023-01-17 MED ORDER — OXYTOCIN-SODIUM CHLORIDE 30-0.9 UT/500ML-% IV SOLN
2.5000 [IU]/h | INTRAVENOUS | Status: AC
  Administered 2023-01-17: 2.5 [IU]/h via INTRAVENOUS

## 2023-01-17 MED ORDER — LACTATED RINGERS IV SOLN: INTRAVENOUS | Status: DC

## 2023-01-17 MED ORDER — OXYCODONE HCL 5 MG PO TABS: 5.0000 mg | ORAL_TABLET | Freq: Once | ORAL | Status: DC | PRN

## 2023-01-17 MED ORDER — ONDANSETRON HCL 4 MG/2ML IJ SOLN: 4.0000 mg | Freq: Once | INTRAMUSCULAR | Status: DC | PRN

## 2023-01-17 SURGICAL SUPPLY — 31 items
APL PRP STRL LF DISP 70% ISPRP (MISCELLANEOUS) ×2
CHLORAPREP W/TINT 26 (MISCELLANEOUS) ×2 IMPLANT
CLAMP UMBILICAL CORD (MISCELLANEOUS) ×1 IMPLANT
CLOTH BEACON ORANGE TIMEOUT ST (SAFETY) ×1 IMPLANT
DRSG OPSITE POSTOP 4X10 (GAUZE/BANDAGES/DRESSINGS) ×1 IMPLANT
ELECT REM PT RETURN 9FT ADLT (ELECTROSURGICAL) ×1
ELECTRODE REM PT RTRN 9FT ADLT (ELECTROSURGICAL) ×1 IMPLANT
EXTRACTOR VACUUM M CUP 4 TUBE (SUCTIONS) IMPLANT
GAUZE SPONGE 4X4 12PLY STRL LF (GAUZE/BANDAGES/DRESSINGS) IMPLANT
GLOVE BIOGEL PI IND STRL 7.0 (GLOVE) ×3 IMPLANT
GLOVE ECLIPSE 7.0 STRL STRAW (GLOVE) ×1 IMPLANT
GOWN STRL REUS W/TWL LRG LVL3 (GOWN DISPOSABLE) ×1 IMPLANT
GOWN STRL REUS W/TWL XL LVL3 (GOWN DISPOSABLE) ×1 IMPLANT
KIT ABG SYR 3ML LUER SLIP (SYRINGE) IMPLANT
NDL HYPO 25X5/8 SAFETYGLIDE (NEEDLE) ×1 IMPLANT
NEEDLE HYPO 22GX1.5 SAFETY (NEEDLE) ×1 IMPLANT
NEEDLE HYPO 25X5/8 SAFETYGLIDE (NEEDLE) ×1 IMPLANT
NS IRRIG 1000ML POUR BTL (IV SOLUTION) ×1 IMPLANT
PACK C SECTION WH (CUSTOM PROCEDURE TRAY) ×1 IMPLANT
PAD ABD 7.5X8 STRL (GAUZE/BANDAGES/DRESSINGS) ×1 IMPLANT
PAD OB MATERNITY 4.3X12.25 (PERSONAL CARE ITEMS) ×1 IMPLANT
RTRCTR C-SECT PINK 25CM LRG (MISCELLANEOUS) IMPLANT
SUT PDS AB 0 CTX 36 PDP370T (SUTURE) IMPLANT
SUT PLAIN 2 0 XLH (SUTURE) IMPLANT
SUT VIC AB 0 CTX 36 (SUTURE) ×2
SUT VIC AB 0 CTX36XBRD ANBCTRL (SUTURE) ×2 IMPLANT
SUT VIC AB 4-0 KS 27 (SUTURE) ×1 IMPLANT
SYR CONTROL 10ML LL (SYRINGE) ×1 IMPLANT
TOWEL OR 17X24 6PK STRL BLUE (TOWEL DISPOSABLE) ×1 IMPLANT
TRAY FOLEY W/BAG SLVR 14FR LF (SET/KITS/TRAYS/PACK) ×1 IMPLANT
WATER STERILE IRR 1000ML POUR (IV SOLUTION) ×1 IMPLANT

## 2023-01-17 NOTE — Anesthesia Preprocedure Evaluation (Addendum)
Anesthesia Evaluation  Patient identified by MRN, date of birth, ID band Patient awake    Reviewed: Allergy & Precautions, NPO status , Patient's Chart, lab work & pertinent test results  Airway Mallampati: II  TM Distance: >3 FB Neck ROM: Full    Dental no notable dental hx. (+) Teeth Intact   Pulmonary neg pulmonary ROS   Pulmonary exam normal breath sounds clear to auscultation       Cardiovascular hypertension (Pre E on Mg++), Pt. on medications Normal cardiovascular exam Rhythm:Regular Rate:Normal     Neuro/Psych negative neurological ROS  negative psych ROS   GI/Hepatic negative GI ROS, Neg liver ROS,,,  Endo/Other    Morbid obesity (BmI 53.7)  Renal/GU      Musculoskeletal negative musculoskeletal ROS (+)    Abdominal   Peds  Hematology Lab Results      Component                Value               Date                           HGB                      11.5 (L)            01/16/2023                HCT                      35.2 (L)            01/16/2023                MCV                      86.7                01/16/2023                PLT                      282                 01/16/2023              Anesthesia Other Findings NKDA  Reproductive/Obstetrics (+) Pregnancy                             Anesthesia Physical Anesthesia Plan  ASA: 3  Anesthesia Plan: Epidural   Post-op Pain Management:    Induction:   PONV Risk Score and Plan:   Airway Management Planned:   Additional Equipment:   Intra-op Plan:   Post-operative Plan:   Informed Consent: I have reviewed the patients History and Physical, chart, labs and discussed the procedure including the risks, benefits and alternatives for the proposed anesthesia with the patient or authorized representative who has indicated his/her understanding and acceptance.       Plan Discussed with:   Anesthesia Plan  Comments: (35.5 wk  G4P2 w prE on Mg++ w Bmi of 53.7 for LEA)        Anesthesia Quick Evaluation

## 2023-01-17 NOTE — Lactation Note (Addendum)
This note was copied from a baby's chart. Lactation Consultation Note  Patient Name: Nicole Mclean [id number] Date: 01/17/2023 Age:28 hours Reason for consult: Initial assessment;Late-preterm 34-36.6wks;Infant < 6lbs  P3, Baby [redacted]w[redacted]d in nursery warmer for temperature regulation.  Reviewed hand expression.  LC, mother and NT shadow hand expressed 5 ml.  Reviewed LPI feeding plan and mother consented to donor milk. Crib card reviewed with volume guidelines. Mother sleepy during consult but may need review. Set up DEBP.  24 mm flanges seem appropriate at this time.  Plan: Keep baby STS as much as possible  Offer breast when baby cues that he is hungry, or awaken baby for feeding at 3 hrs.  Breast feed baby, asking for help prn.  Limit total feeding time 30 mins so not to overtire baby. If baby does not latch after 10 min of attempt - give supplemental breastmilk/donor milk.  Slow flow nipple bottle is an option.   Pump both breasts 15 minutes on initiation setting, adding hand expression to collect as much colostrum as possible to feed baby.  Give baby colostrum expressed on spoon.   Feed baby 12-14 ml EBM+/donor milk after breastfeeding per LPTI volume guidelines increasing per day of life and as baby desires.     Maternal Data Has patient been taught Hand Expression?: Yes Does the patient have breastfeeding experience prior to this delivery?: Yes How long did the patient breastfeed?: 4 mos. with second child  Feeding Mother's Current Feeding Choice: Breast Milk and Donor Milk  LATCH Score Latch: Repeated attempts needed to sustain latch, nipple held in mouth throughout feeding, stimulation needed to elicit sucking reflex.  Audible Swallowing: None  Type of Nipple: Everted at rest and after stimulation  Comfort (Breast/Nipple): Soft / non-tender  Hold (Positioning): Assistance needed to correctly position infant at breast and maintain latch.  LATCH Score: 6   Lactation Tools  Discussed/Used Tools: Pump;Flanges Flange Size: 24 Breast pump type: Double-Electric Breast Pump;Manual Pump Education: Setup, frequency, and cleaning;Milk Storage Reason for Pumping: stimulation and supplementation Pumping frequency: q 3 hours Pumped volume: 5 mL (with  hand expression)  Interventions Interventions: Hand express;DEBP;Education;LC Services brochure;LPT handout/interventions  Discharge Pump: Personal;DEBP (Max Flow)  Consult Status Consult Status: Follow-up Date: 01/18/23 Follow-up type: In-patient    Dahlia Byes Boschen  RN IBCLC 01/17/2023, 9:30 AM

## 2023-01-17 NOTE — Discharge Summary (Signed)
Postpartum Discharge Summary  Date of Service updated     Patient Name: Nicole Mclean DOB: 1996-03-10 MRN: [id number]  Date of admission: 01/15/2023 Delivery date:01/17/2023  Delivering provider: Jaynie Collins A  Date of discharge: 01/20/2023  Admitting diagnosis: Severe preeclampsia [O14.10] Preeclampsia [O14.90] Intrauterine pregnancy: [redacted]w[redacted]d     Secondary diagnosis:  Principal Problem:   Severe preeclampsia, delivered Active Problems:   Gonorrhea of mother during pregnancy, third trimester   Maternal morbid obesity, antepartum (HCC)   S/P cesarean section  Additional problems: Iron deficiency anemia     Discharge diagnosis: Preterm Pregnancy Delivered and Preeclampsia (severe)                                              Post partum procedures: None Augmentation: Pitocin, Cytotec, and IP Foley Complications: None  Hospital course: Induction of Labor With Cesarean Section   27 y.o. yo [id number] at [redacted]w[redacted]d was admitted to the hospital 01/15/2023 for induction of labor. Patient had a labor course significant for prolonged decels x3 requiring terb and discontinuing pitocin. The patient went for cesarean section due to Non-Reassuring FHR. Delivery details are as follows: Membrane Rupture Time/Date: 1:55 AM ,01/17/2023   Delivery Method:C-Section, Low Transverse  Details of operation can be found in separate operative Note.  Patient had a postpartum course complicated by her PreE with severe features. She was managed with Magnesium for 24 hours, and then her blood pressure was well controlled with Procardia and Lasix. Her HgB was noted to 9.4 after surgery which was appropriate and for this she was started on Iron. She did also have pre-existing anemia of pregnancy prior to presentation.  She is ambulating, tolerating a regular diet, passing flatus, and urinating well.  Patient is discharged home in stable condition on 01/20/23.      Newborn Data: Birth date:01/17/2023  Birth time:6:06 AM   Gender:Female  Living status:Living  Apgars:1 ,5  Weight:2410 g                                Magnesium Sulfate received: Yes: Seizure prophylaxis BMZ received: No Rhophylac:N/A MMR:N/A T-DaP:Given prenatally Flu: N/A Transfusion:No  Physical exam  Vitals:   01/19/23 1538 01/19/23 2005 01/19/23 2327 01/20/23 0506  BP: 127/73 (!) 120/58 116/80 133/76  Pulse: 90 89 98 87  Resp: 20 19  18   Temp: 97.7 F (36.5 C) 98.4 F (36.9 C) 98.3 F (36.8 C) 97.9 F (36.6 C)  TempSrc: Oral Oral Oral Oral  SpO2: 100% 98%  99%  Weight:      Height:       General: alert, cooperative, and no distress Lochia: appropriate Uterine Fundus: firm Incision: Prevena well sealed/suctioned DVT Evaluation: No cords or calf tenderness. Labs: Lab Results  Component Value Date   WBC 14.0 (H) 01/18/2023   HGB 9.4 (L) 01/18/2023   HCT 28.8 (L) 01/18/2023   MCV 87.5 01/18/2023   PLT 264 01/18/2023      Latest Ref Rng & Units 01/17/2023    7:17 AM  CMP  Glucose 70 - 99 mg/dL 962   BUN 6 - 20 mg/dL <5   Creatinine 9.52 - 1.00 mg/dL 8.41   Sodium 324 - 401 mmol/L 132   Potassium 3.5 - 5.1 mmol/L 3.7   Chloride 98 -  111 mmol/L 101   CO2 22 - 32 mmol/L 21   Calcium 8.9 - 10.3 mg/dL 7.4   Total Protein 6.5 - 8.1 g/dL 5.8   Total Bilirubin 0.3 - 1.2 mg/dL 0.2   Alkaline Phos 38 - 126 U/L 115   AST 15 - 41 U/L 16   ALT 0 - 44 U/L 14    Edinburgh Score:    01/18/2023    6:21 AM  Edinburgh Postnatal Depression Scale Screening Tool  I have been able to laugh and see the funny side of things. 0  I have looked forward with enjoyment to things. 0  I have blamed myself unnecessarily when things went wrong. 1  I have been anxious or worried for no good reason. 2  I have felt scared or panicky for no good reason. 0  Things have been getting on top of me. 1  I have been so unhappy that I have had difficulty sleeping. 0  I have felt sad or miserable. 1  I have been so unhappy that I have been crying.  0  The thought of harming myself has occurred to me. 0  Edinburgh Postnatal Depression Scale Total 5     After visit meds:  Allergies as of 01/20/2023   No Known Allergies      Medication List     STOP taking these medications    acetaminophen 500 MG tablet Commonly known as: TYLENOL   aspirin EC 81 MG tablet   Blood Pressure Kit Devi   cyclobenzaprine 10 MG tablet Commonly known as: FLEXERIL   ferrous sulfate 325 (65 FE) MG EC tablet Replaced by: ferrous sulfate 325 (65 FE) MG tablet       TAKE these medications    ferrous sulfate 325 (65 FE) MG tablet Take 1 tablet (325 mg total) by mouth every other day. Replaces: ferrous sulfate 325 (65 FE) MG EC tablet   furosemide 20 MG tablet Commonly known as: LASIX Take 1 tablet (20 mg total) by mouth 2 (two) times daily for 3 days.   gabapentin 300 MG capsule Commonly known as: NEURONTIN Take 1 capsule (300 mg total) by mouth 2 (two) times daily.   ibuprofen 600 MG tablet Commonly known as: ADVIL Take 1 tablet (600 mg total) by mouth every 6 (six) hours.   NIFEdipine 60 MG 24 hr tablet Commonly known as: ADALAT CC Take 1 tablet (60 mg total) by mouth daily.   oxyCODONE 5 MG immediate release tablet Commonly known as: Oxy IR/ROXICODONE Take 1-2 tablets (5-10 mg total) by mouth every 4 (four) hours as needed for moderate pain.   Prenatal Vitamin Plus Low Iron 27-1 MG Tabs Take 1 tablet by mouth daily.               Discharge Care Instructions  (From admission, onward)           Start     Ordered   01/20/23 0000  Discharge wound care:       Comments: The Chester Holstein will be removed in the office next week.   01/20/23 0716             Discharge home in stable condition Infant Feeding: Breast Infant Disposition: Most likely home with mother today.  Discharge instruction: per After Visit Summary and Postpartum booklet. Activity: Advance as tolerated. Pelvic rest for 6 weeks.  Diet: carb  modified diet Future Appointments: Future Appointments  Date Time Provider Department Center  01/27/2023 10:00 AM [facility]  NURSE [facility] None  02/28/2023  8:15 AM Adam Phenix, MD [facility] None   Follow up Visit:  Follow-up Information     [facility] for [facility] at [facility] Follow up.   Specialty: Obstetrics and Gynecology Contact information: [address] [place] [address] [phone]                Message sent to [facility] by Autry-Lott on 01/20/2023  Please schedule this patient for a In person postpartum visit in 6 weeks with the following provider: MD and APP. Additional Postpartum F/U:Incision check 1 week and BP check 1 week  High risk pregnancy complicated by:  severe preE Delivery mode:  C-Section, Low Transverse  Anticipated Birth Control:   Patch   01/20/2023 Milas Hock, MD

## 2023-01-17 NOTE — Anesthesia Postprocedure Evaluation (Signed)
Anesthesia Post Note  Patient: Nicole Mclean  Procedure(s) Performed: CESAREAN SECTION     Patient location during evaluation: Mother Baby Anesthesia Type: Epidural Level of consciousness: oriented and awake and alert Pain management: pain level controlled Vital Signs Assessment: post-procedure vital signs reviewed and stable Respiratory status: spontaneous breathing and respiratory function stable Cardiovascular status: blood pressure returned to baseline and stable Postop Assessment: no headache, no backache, no apparent nausea or vomiting and able to ambulate Anesthetic complications: no   No notable events documented.  Last Vitals:  Vitals:   01/17/23 1238 01/17/23 1517  BP: 119/65 118/73  Pulse: 72 72  Resp:  18  Temp:  36.6 C  SpO2:  96%    Last Pain:  Vitals:   01/17/23 1517  TempSrc: Oral  PainSc:    Pain Goal: Patients Stated Pain Goal: 5 (01/17/23 0715)                 [name]

## 2023-01-17 NOTE — Op Note (Signed)
Nicole Mclean PROCEDURE DATE: 01/17/2023  PREOPERATIVE DIAGNOSES: Intrauterine pregnancy at [redacted]w[redacted]d weeks gestation; recurrent episodes of non-reassuring fetal status remote from vaginal delivery; failed induction of labor for severe preeclampsia; morbid obesity  POSTOPERATIVE DIAGNOSES: The same  PROCEDURE: Urgent Low Transverse Cesarean Section  SURGEON:  Dr. Jaynie Collins  ASSISTANT:  Dr. Randa Evens Autry-Lott. An experienced assistant was required given the standard of surgical care given the complexity of the case.  This assistant was needed for exposure, dissection, suctioning, retraction, instrument exchange, assisting with delivery with administration of fundal pressure, and for overall help during the procedure.  ANESTHESIOLOGY TEAM: Anesthesiologist: [name], MD CRNA: Claudina Lick, CRNA  INDICATIONS: Nicole Mclean is a 27 y.o. [phone] at [redacted]w[redacted]d here for cesarean section secondary to the indications listed under preoperative diagnoses; please see preoperative note for further details.  The risks of surgery were discussed with the patient including but were not limited to: bleeding which may require transfusion or reoperation; infection which may require antibiotics; injury to bowel, bladder, ureters or other surrounding organs; injury to the fetus; need for additional procedures including hysterectomy in the event of a life-threatening hemorrhage; formation of adhesions; placental abnormalities wth subsequent pregnancies; incisional problems; thromboembolic phenomenon and other postoperative/anesthesia complications.  The patient concurred with the proposed plan, giving informed written consent for the procedure.    FINDINGS:  Viable female infant in cephalic presentation.  Apgars 1/5/8. Weight 2410g.  Cord pH results pending at the time of the note; infant was stable and remained in the OR with mother after initial resuscitation by Neonatology team. Clear amniotic fluid.  Intact  placenta, three vessel cord.  Normal uterus, fallopian tubes and ovaries bilaterally.  Prevena was placed over closed skin incision at the end of the case.   ANESTHESIA: Epidural  ESTIMATED BLOOD LOSS: 313 ml SPECIMENS: Placenta sent to pathology COMPLICATIONS: None immediate  PROCEDURE IN DETAIL:  The patient preoperatively received intravenous antibiotics and had sequential compression devices applied to her lower extremities.  She was then taken to the operating room where the epidural anesthesia was dosed up to surgical level and was found to be adequate. She was then placed in a dorsal supine position with a leftward tilt, and prepped and draped in a sterile manner.  A foley catheter was placed into her bladder and attached to constant gravity.  After an adequate timeout was performed, a Pfannenstiel skin incision was made with scalpel and carried through to the underlying layer of fascia. The fascia was incised in the midline, and this incision was extended bilaterally in a blunt fashion.  The underlying rectus muscles were dissected off the fascia superiorly and inferiorly in a blunt fashion. The rectus muscles were separated in the midline and the peritoneum was entered bluntly. The Alexis self-retaining retractor was introduced into the abdominal cavity.  Attention was turned to the lower uterine segment where a low transverse hysterotomy was made with a scalpel and extended bilaterally bluntly.  The infant was successfully delivered, the cord was clamped and cut after one minute, and the infant was handed over to the awaiting neonatology team. Uterine massage was then administered, and the placenta delivered intact with a three-vessel cord. The uterus was then cleared of clots and debris.  The hysterotomy was closed with 0 Vicryl in a running locked fashion.  Figure-of-eight 0 Vicryl serosal stitches were placed to help with hemostasis.  The pelvis was cleared of all clot and debris. Hemostasis was  confirmed on all  surfaces.  The retractor was removed.  The peritoneum was closed with a 0 Vicryl pursestring stitch.  The fascia was then closed using 0 PDS in a running fashion.  The subcutaneous layer was irrigated, reapproximated with 2-0 plain gut interrupted stitches, and the skin was closed with a 4-0 Vicryl subcuticular stitch. After the skin was closed, a Prevena disposable negative pressure wound therapy device was placed over the incision.  The suction was activated at a pressure of -125 mmHg.  The adhesive was affixed well and there were no leaks noted.  The patient tolerated the procedure well. Sponge, instrument and needle counts were correct x 3.  She was taken to the recovery room in stable condition.    Jaynie Collins, MD, FACOG Obstetrician & Gynecologist, [facility] for [employer], [facility]

## 2023-01-17 NOTE — Progress Notes (Addendum)
Labor Progress Note  Called to the room due to 3rd prolonged, the last 8 minutes long. Pit previously discontinued at that time. Terb x1 given now. At this time I recommend moving towards c-section due to NRFHT.   The risks of surgery were discussed with the patient including but were not limited to: bleeding which may require transfusion or reoperation; infection which may require antibiotics; injury to bowel, bladder, ureters or other surrounding organs; injury to the fetus; need for additional procedures including hysterectomy in the event of a life-threatening hemorrhage; formation of adhesions; placental abnormalities wth subsequent pregnancies; incisional problems; thromboembolic phenomenon and other postoperative/anesthesia complications.   The patient concurred with the proposed plan, giving informed written consent for the procedures.  Antibiotics ordered.  To OR when ready.    [name], DO OB Fellow, Faculty Practice [facility], Center for [facility] 01/17/2023, 5:24 AM   Attestation of Attending Supervision of Obstetric Fellow: Evaluation and management procedures were performed by the Obstetric Fellow under my supervision and collaboration.  I have reviewed the Obstetric Fellow's note and chart, and I agree with the management and plan. I have also made any necessary editorial changes.  Patient seen and evaluated; has recurrent episodes of Category II FHR tracing remote from vaginal delivery.  She has agreed to proceed with cesarean section, had no questions.  OR and Anesthesiology teams notified.  Ancef, Azithromycin ordered.  To OR when ready.   Jaynie Collins, MD, FACOG Attending Obstetrician & Gynecologist, [facility] for [employer], [facility]

## 2023-01-17 NOTE — Transfer of Care (Signed)
Immediate Anesthesia Transfer of Care Note  Patient: Nicole Mclean  Procedure(s) Performed: CESAREAN SECTION  Patient Location: PACU  Anesthesia Type:Epidural  Level of Consciousness: awake  Airway & Oxygen Therapy: Patient Spontanous Breathing  Post-op Assessment: Report given to RN  Post vital signs: Reviewed and stable  Last Vitals:  Vitals Value Taken Time  BP    Temp    Pulse    Resp    SpO2      Last Pain:  Vitals:   01/17/23 0600  TempSrc:   PainSc: 0-No pain      Patients Stated Pain Goal: 0 (01/17/23 0105)  Complications: No notable events documented.

## 2023-01-17 NOTE — Progress Notes (Signed)
Labor Progress Note Nicole Mclean is a 27 y.o. [id number] at [redacted]w[redacted]d presented for IOL due to severe preeclampsia (BP and HA criteria)  BP 109/73   Pulse 78   Temp (!) 97.5 F (36.4 C) (Oral)   Resp 17   Ht 5\' 1"  (1.549 m)   Wt 128.8 kg   LMP 05/06/2022 (Exact Date)   SpO2 100%   BMI 53.66 kg/m  EFM: 120 bpm/min to moderate/+accels, no decels  CVE: Dilation: 4 Effacement (%): 50 Cervical Position: Posterior Station: -3 Presentation: Vertex Exam by:: Jenn Mbugua   A&P: 27 y.o. [name] [redacted]w[redacted]d  #Labor: Now 5/80/-1. Pitocin discontinued at 0347 due to an 8 minute prolonged decel. Min to mod variability. Would restart pitocin when variability improves. +scal stim #FWB: cat II, min to mod variability but continues to be reassuring #GBS  unknown, PCN prophylaxis given  G/C culture positive, Rocephin given  Nicole Jumbo, DO OB Fellow, Faculty Practice [facility], Center for [facility] 01/17/2023, 5:10 AM

## 2023-01-17 NOTE — Progress Notes (Signed)
Labor Progress Note Nicole Mclean is a 27 y.o. [id number] at [redacted]w[redacted]d presented for IOL due to severe preeclampsia (BP and HA criteria)  BP (!) 110/57   Pulse 60   Temp 98.1 F (36.7 C) (Oral)   Resp 17   Ht 5\' 1"  (1.549 m)   Wt 128.8 kg   LMP 05/06/2022 (Exact Date)   SpO2 100%   BMI 53.66 kg/m  EFM: 120 bpm/moderate/+accels, no decels  CVE: Dilation: 4 Effacement (%): 50 Cervical Position: Posterior Station: -3 Presentation: Vertex Exam by:: Jenn Mbugua   A&P: 27 y.o. [name] [redacted]w[redacted]d  #Labor: s/p cyto x3, FB, pit, and SROM. Continue pitocin.  #FWB: cat 1 #GBS  unknown, PCN prophylaxis given  G/C culture positive, Rocephin given  [name], DO OB Fellow, Faculty Practice [facility], Center for [facility] 01/17/2023, 3:31 AM

## 2023-01-17 NOTE — Anesthesia Procedure Notes (Signed)
Epidural Patient location during procedure: OB Start time: 01/17/2023 1:19 AM End time: 01/17/2023 1:35 AM  Staffing Anesthesiologist: [name], MD Performed: anesthesiologist   Preanesthetic Checklist Completed: patient identified, IV checked, site marked, risks and benefits discussed, surgical consent, monitors and equipment checked, pre-op evaluation and timeout performed  Epidural Patient position: sitting Prep: DuraPrep and site prepped and draped Patient monitoring: continuous pulse ox and blood pressure Approach: midline Location: L3-L4 Injection technique: LOR air  Needle:  Needle type: Tuohy  Needle gauge: 17 G Needle length: 9 cm and 9 Needle insertion depth: 7 cm Catheter type: closed end flexible Catheter size: 19 Gauge Catheter at skin depth: 15 cm Test dose: negative  Assessment Events: blood not aspirated, no cerebrospinal fluid, injection not painful, no injection resistance, no paresthesia and negative IV test  Additional Notes Patient identified. Risks/Benefits/Options discussed with patient including but not limited to bleeding, infection, nerve damage, paralysis, failed block, incomplete pain control, headache, blood pressure changes, nausea, vomiting, reactions to medication both or allergic, itching and postpartum back pain. Confirmed with bedside nurse the patient's most recent platelet count. Confirmed with patient that they are not currently taking any anticoagulation, have any bleeding history or any family history of bleeding disorders. Patient expressed understanding and wished to proceed. All questions were answered. Sterile technique was used throughout the entire procedure. Please see nursing notes for vital signs. Test dose was given through epidural needle and negative prior to continuing to dose epidural or start infusion. Warning signs of high block given to the patient including shortness of breath, tingling/numbness in hands, complete motor  block, or any concerning symptoms with instructions to call for help. Patient was given instructions on fall risk and not to get out of bed. All questions and concerns addressed with instructions to call with any issues.  1 Attempt (S) . Patient tolerated procedure well.

## 2023-01-17 NOTE — Progress Notes (Signed)
The patient does not desire to have a cervical exam again until she is comfortable with her epidural.   [name], DO OB Fellow, Faculty Practice [facility], Center for [facility] 01/17/2023, 12:01 AM

## 2023-01-18 ENCOUNTER — Encounter (HOSPITAL_COMMUNITY): Payer: Self-pay | Admitting: Obstetrics & Gynecology

## 2023-01-18 LAB — CBC
HCT: 28.8 % — ABNORMAL LOW (ref 36.0–46.0)
Hemoglobin: 9.4 g/dL — ABNORMAL LOW (ref 12.0–15.0)
MCH: 28.6 pg (ref 26.0–34.0)
MCHC: 32.6 g/dL (ref 30.0–36.0)
MCV: 87.5 fL (ref 80.0–100.0)
Platelets: 264 10*3/uL (ref 150–400)
RBC: 3.29 MIL/uL — ABNORMAL LOW (ref 3.87–5.11)
RDW: 14.6 % (ref 11.5–15.5)
WBC: 14 10*3/uL — ABNORMAL HIGH (ref 4.0–10.5)
nRBC: 0 % (ref 0.0–0.2)

## 2023-01-18 LAB — CULTURE, BETA STREP (GROUP B ONLY)

## 2023-01-18 LAB — BRAIN NATRIURETIC PEPTIDE: B Natriuretic Peptide: 52.6 pg/mL (ref 0.0–100.0)

## 2023-01-18 LAB — TROPONIN I (HIGH SENSITIVITY): Troponin I (High Sensitivity): 17 ng/L (ref <18)

## 2023-01-18 NOTE — Progress Notes (Signed)
Subjective: Postpartum Day 1: Cesarean Delivery Patient reports incisional pain, tolerating PO, and no problems voiding.    Objective: Vital signs in last 24 hours: Temp:  [97.6 F (36.4 C)-97.9 F (36.6 C)] 97.7 F (36.5 C) (07/03 0415) Pulse Rate:  [71-102] 102 (07/03 0415) Resp:  [15-19] 16 (07/03 0600) BP: (111-148)/(59-92) 123/59 (07/03 0415) SpO2:  [93 %-100 %] 97 % (07/03 0415) Vitals:   01/18/23 0300 01/18/23 0415 01/18/23 0500 01/18/23 0600  BP:  (!) 123/59    Pulse:  (!) 102    Resp: 16 17 18 16   Temp:  97.7 F (36.5 C)    TempSrc:  Oral    SpO2:  97%    Weight:      Height:        Intake/Output Summary (Last 24 hours) at 01/18/2023 0758 Last data filed at 01/18/2023 0700 Gross per 24 hour  Intake 5644.9 ml  Output 2975 ml  Net 2669.9 ml    Physical Exam:  General: alert, cooperative, and appears stated age 27: appropriate Uterine Fundus: firm Incision: healing well DVT Evaluation: No evidence of DVT seen on physical exam.  Recent Labs    01/17/23 2013 01/18/23 0622  HGB 10.1* 9.4*  HCT 30.3* 28.8*    Assessment/Plan: Status post Cesarean section. Doing well postoperatively.  Continue current care. On Procardia and lasix Magnesium is off Breast feeding. Desires circ - consent done Po iron  Deciding about Circumcision in Baby Boys  (The Basics)  What is circumcision?   Circumcision is a surgery that removes the skin that covers the tip of the penis, called the "foreskin" Circumcision is usually done when a boy is between 25 and 56 days old. In the [place], circumcision is common. In some other countries, fewer boys are circumcised. Circumcision is a common tradition in some religions.  Should I have my baby boy circumcised?   There is no easy answer. Circumcision has some benefits. But it also has risks. After talking with your doctor, you will have to decide for yourself what is right for your family.  What are the benefits of  circumcision?   Circumcised boys seem to have slightly lower rates of: ?Urinary tract infections ?Swelling of the opening at the tip of the penis Circumcised men seem to have slightly lower rates of: ?Urinary tract infections ?Swelling of the opening at the tip of the penis ?Penis cancer ?HIV and other infections that you catch during sex ?Cervical cancer in the women they have sex with Even so, in the [place], the risks of these problems are small - even in boys and men who have not been circumcised. Plus, boys and men who are not circumcised can reduce these extra risks by: ?Cleaning their penis well ?Using condoms during sex  What are the risks of circumcision?  Risks include: ?Bleeding or infection from the surgery ?Damage to or amputation of the penis ?A chance that the doctor will cut off too much or not enough of the foreskin ?A chance that sex won't feel as good later in life Only about 1 out of every 200 circumcisions leads to problems. There is also a chance that your health insurance won't pay for circumcision.  How is circumcision done in baby boys?  First, the baby gets medicine for pain relief. This might be a cream on the skin or a shot into the base of the penis. Next, the doctor cleans the baby's penis well. Then he or she uses special  tools to cut off the foreskin. Finally, the doctor wraps a bandage (called gauze) around the baby's penis. If you have your baby circumcised, his doctor or nurse will give you instructions on how to care for him after the surgery. It is important that you follow those instructions carefully.   Reva Bores, MD 01/18/2023, 7:57 AM

## 2023-01-18 NOTE — Lactation Note (Addendum)
This note was copied from a baby's chart. Lactation Consultation Note  Patient Name: Nicole Mclean ZOXWR'U Date: 01/18/2023 Age:28 hours Reason for consult: Follow-up assessment;Late-preterm 34-36.6wks;Infant < 6lbs;Other (Comment) (GHTN)  LC in to visit with P3 Mom of LPTI delivered by C/Section and Mom on magnesium sulfate after birth.  Baby is at a 3% weight loss today.  Mom reports baby is sleepier this morning.  Reviewed normal LPTI newborn feeding behavior to expect baby to be sleepier today.  GMOB holding baby swaddled.  Baby has latched for 5-30 mins over night without any donor milk supplement given.  Mom has donor milk in the refrigerator.  Recent feedings have been 5-10 mins before baby fatigues.  LC asked about pumping, and Mom has been using the hand pump only.  Mom prefers not to supplement baby as baby is breastfeeding.  Talked about the temporary support of milk supply and baby and the  need to supplement baby after breastfeeding or attempts at the breast.  Plan recommended- (written on dry erase board) 1- STS with baby as much as possible 2- Every 3 hrs or sooner if baby is cueing, offer the breast 3- supplement baby per volume guidelines (18-21 ml today) after breastfeeding 4-Pump both breasts on initiation setting  Reviewed cleaning guidelines and LC washed the pump parts. Assisted Mom to pump using 21 mm flanges and a hands free pumping band.  Encouraged hands on pumping.   Lactation Tools Discussed/Used Tools: Pump;Flanges;Hands-free pumping top;Bottle Flange Size: 21 Breast pump type: Double-Electric Breast Pump Pump Education: Setup, frequency, and cleaning;Milk Storage Reason for Pumping: Support milk supply/LPTI/<6 lbs Pumping frequency: Will begin pumping after breastfeeding or attempts every 3 hrs  Interventions Interventions: Breast feeding basics reviewed;Skin to skin;Breast massage;Hand express;Education;DEBP;Hand pump  Discharge Pump:  Personal;DEBP (Medela Max Flow)  Consult Status Consult Status: Follow-up Date: 01/19/23 Follow-up type: In-patient    [name] 01/18/2023, 10:09 AM

## 2023-01-19 NOTE — Lactation Note (Signed)
This note was copied from a baby's chart. Lactation Consultation Note  Patient Name: Nicole Mclean ZOXWR'U Date: 01/19/2023 Age:28 hours Reason for consult: Follow-up assessment;Late-preterm 34-36.6wks;Infant < 6lbs  LC in to visit with P3 Mom of baby "Nicole Mclean".  Baby is at a 4.6% weight loss and having good output and bilirubin stable at low level.  Praised Mom for her hard work.    Baby is latching and Mom is supplementing with EBM by paced bottle.  Last feeding baby took 30 ml.  Unfortunately there was 60 ml EBM left in bottle which now can not be fed to baby before it has been over an hour.  Mom desires to keep it for milk baths etc.  LC encouraged Mom to separate her EBM to 30 ml bottles, so not to waste any of her precious milk.    LC noticed all the pump parts in the bin to wash.  LC quickly washed them for Mom as she was resting with baby.    Encouraged Mom to continue offering the breast, supplementing with EBM and pumping.  Goal is 8 feedings/pumpings per 24 hrs.    Encouraged Mom to ask for help prn.  Mom denies any engorgement.  Breasts heavy, but milk is flowing well.    Lactation Tools Discussed/Used Tools: Pump;Flanges;Bottle;Hands-free pumping top Flange Size: 21 Breast pump type: Double-Electric Breast Pump Pump Education: Setup, frequency, and cleaning;Milk Storage Reason for Pumping: support milk supply Pumping frequency: every 3 hrs after breastfeeding Pumped volume: 90 mL (60 ml after breastfeeding)  Interventions Interventions: Skin to skin;Breast massage;Hand express;DEBP;Pace feeding   Consult Status Consult Status: Follow-up Date: 01/20/23 Follow-up type: In-patient    [name] 01/19/2023, 9:35 AM

## 2023-01-19 NOTE — Progress Notes (Signed)
Subjective: Postpartum Day 2: Cesarean Delivery Patient reports incisional pain, tolerating PO, and no problems voiding.    Objective: Vital signs in last 24 hours: Temp:  [97.6 F (36.4 C)-98.3 F (36.8 C)] 98.1 F (36.7 C) (07/04 0400) Pulse Rate:  [69-92] 80 (07/04 0400) Resp:  [18-20] 19 (07/04 0400) BP: (108-127)/(55-75) 118/75 (07/04 0400) SpO2:  [99 %-100 %] 100 % (07/04 0400) Vitals:   01/18/23 1658 01/18/23 1940 01/18/23 2330 01/19/23 0400  BP: (!) 110/55 123/63 108/63 118/75  Pulse: 71 84 92 80  Resp: 20 18 18 19   Temp: 97.9 F (36.6 C) 98.3 F (36.8 C) 98.2 F (36.8 C) 98.1 F (36.7 C)  TempSrc: Oral Oral Oral Oral  SpO2: 100% 100% 100% 100%  Weight:      Height:        Intake/Output Summary (Last 24 hours) at 01/19/2023 0751 Last data filed at 01/19/2023 2841 Gross per 24 hour  Intake 1080 ml  Output 1250 ml  Net -170 ml    Physical Exam:  General: alert, cooperative, and appears stated age 27: appropriate Uterine Fundus: firm Incision: healing well DVT Evaluation: No evidence of DVT seen on physical exam.  Recent Labs    01/17/23 2013 01/18/23 0622  HGB 10.1* 9.4*  HCT 30.3* 28.8*    Assessment/Plan: Status post Cesarean section. Doing well postoperatively.  Continue current care. On lasix and procardia Still positive on fluid status BP is well controlled. Plan for DC tomorrow Circ - consent done  Reva Bores, MD 01/19/2023, 7:49 AM

## 2023-01-20 ENCOUNTER — Other Ambulatory Visit (HOSPITAL_COMMUNITY): Payer: Self-pay

## 2023-01-20 LAB — SURGICAL PATHOLOGY

## 2023-01-20 MED ORDER — FUROSEMIDE 20 MG PO TABS
20.0000 mg | ORAL_TABLET | Freq: Two times a day (BID) | ORAL | 0 refills | Status: DC
  Filled 2023-01-20: qty 6, 3d supply, fill #0

## 2023-01-20 MED ORDER — FERROUS SULFATE 325 (65 FE) MG PO TABS
325.0000 mg | ORAL_TABLET | ORAL | 0 refills | Status: DC
  Filled 2023-01-20: qty 100, 200d supply, fill #0

## 2023-01-20 MED ORDER — GABAPENTIN 300 MG PO CAPS
300.0000 mg | ORAL_CAPSULE | Freq: Two times a day (BID) | ORAL | 0 refills | Status: DC
  Filled 2023-01-20: qty 30, 15d supply, fill #0

## 2023-01-20 MED ORDER — OXYCODONE HCL 5 MG PO TABS
5.0000 mg | ORAL_TABLET | ORAL | 0 refills | Status: DC | PRN
  Filled 2023-01-20: qty 20, 4d supply, fill #0

## 2023-01-20 MED ORDER — NIFEDIPINE ER 60 MG PO TB24
60.0000 mg | ORAL_TABLET | Freq: Every day | ORAL | 0 refills | Status: DC
  Filled 2023-01-20: qty 30, 30d supply, fill #0

## 2023-01-20 MED ORDER — IBUPROFEN 600 MG PO TABS
600.0000 mg | ORAL_TABLET | Freq: Four times a day (QID) | ORAL | 0 refills | Status: DC
  Filled 2023-01-20: qty 30, 8d supply, fill #0

## 2023-01-20 NOTE — Plan of Care (Signed)
Problem: Education: Goal: Knowledge of disease or condition will improve Outcome: Adequate for Discharge Goal: Knowledge of the prescribed therapeutic regimen will improve Outcome: Adequate for Discharge   Problem: Fluid Volume: Goal: Peripheral tissue perfusion will improve Outcome: Adequate for Discharge   Problem: Clinical Measurements: Goal: Complications related to disease process, condition or treatment will be avoided or minimized Outcome: Adequate for Discharge   Problem: Education: Goal: Knowledge of Childbirth will improve Outcome: Adequate for Discharge Goal: Ability to make informed decisions regarding treatment and plan of care will improve Outcome: Adequate for Discharge Goal: Ability to state and carry out methods to decrease the pain will improve Outcome: Adequate for Discharge Goal: Individualized Educational Video(s) Outcome: Adequate for Discharge   Problem: Coping: Goal: Ability to verbalize concerns and feelings about labor and delivery will improve Outcome: Adequate for Discharge   Problem: Life Cycle: Goal: Ability to make normal progression through stages of labor will improve Outcome: Adequate for Discharge Goal: Ability to effectively push during vaginal delivery will improve Outcome: Adequate for Discharge   Problem: Role Relationship: Goal: Will demonstrate positive interactions with the child Outcome: Adequate for Discharge   Problem: Safety: Goal: Risk of complications during labor and delivery will decrease Outcome: Adequate for Discharge   Problem: Pain Management: Goal: Relief or control of pain from uterine contractions will improve Outcome: Adequate for Discharge   Problem: Education: Goal: Knowledge of General Education information will improve Description: Including pain rating scale, medication(s)/side effects and non-pharmacologic comfort measures Outcome: Adequate for Discharge   Problem: Health Behavior/Discharge  Planning: Goal: Ability to manage health-related needs will improve Outcome: Adequate for Discharge   Problem: Clinical Measurements: Goal: Ability to maintain clinical measurements within normal limits will improve Outcome: Adequate for Discharge Goal: Will remain free from infection Outcome: Adequate for Discharge Goal: Diagnostic test results will improve Outcome: Adequate for Discharge Goal: Respiratory complications will improve Outcome: Adequate for Discharge Goal: Cardiovascular complication will be avoided Outcome: Adequate for Discharge   Problem: Activity: Goal: Risk for activity intolerance will decrease Outcome: Adequate for Discharge   Problem: Nutrition: Goal: Adequate nutrition will be maintained Outcome: Adequate for Discharge   Problem: Coping: Goal: Level of anxiety will decrease Outcome: Adequate for Discharge   Problem: Elimination: Goal: Will not experience complications related to bowel motility Outcome: Adequate for Discharge Goal: Will not experience complications related to urinary retention Outcome: Adequate for Discharge   Problem: Pain Managment: Goal: General experience of comfort will improve Outcome: Adequate for Discharge   Problem: Safety: Goal: Ability to remain free from injury will improve Outcome: Adequate for Discharge   Problem: Skin Integrity: Goal: Risk for impaired skin integrity will decrease Outcome: Adequate for Discharge   Problem: Education: Goal: Knowledge of the prescribed therapeutic regimen will improve Outcome: Adequate for Discharge Goal: Understanding of sexual limitations or changes related to disease process or condition will improve Outcome: Adequate for Discharge Goal: Individualized Educational Video(s) Outcome: Adequate for Discharge   Problem: Self-Concept: Goal: Communication of feelings regarding changes in body function or appearance will improve Outcome: Adequate for Discharge   Problem: Skin  Integrity: Goal: Demonstration of wound healing without infection will improve Outcome: Adequate for Discharge   Problem: Education: Goal: Knowledge of condition will improve Outcome: Adequate for Discharge Goal: Individualized Educational Video(s) Outcome: Adequate for Discharge Goal: Individualized Newborn Educational Video(s) Outcome: Adequate for Discharge   Problem: Activity: Goal: Will verbalize the importance of balancing activity with adequate rest periods Outcome: Adequate for Discharge Goal: Ability to  tolerate increased activity will improve Outcome: Adequate for Discharge   Problem: Coping: Goal: Ability to identify and utilize available resources and services will improve Outcome: Adequate for Discharge   Problem: Life Cycle: Goal: Chance of risk for complications during the postpartum period will decrease Outcome: Adequate for Discharge   Problem: Role Relationship: Goal: Ability to demonstrate positive interaction with newborn will improve Outcome: Adequate for Discharge   Problem: Skin Integrity: Goal: Demonstration of wound healing without infection will improve Outcome: Adequate for Discharge

## 2023-01-20 NOTE — Progress Notes (Signed)
Discharge instructions reviewed / given to patient, including upcoming appointments, medications, HTN in postpartum period, how to take BP, care at home, and when to call the provider discussed. All questions and concerns addressed and patient verbalized understanding of all information given. Patient is alert and oriented, ambulatory, and VS and pain stable at discharge.

## 2023-01-20 NOTE — Lactation Note (Signed)
This note was copied from a baby's chart. Lactation Consultation Note  Patient Name: Nicole Mclean [id number] Date: 01/20/2023 Age:28 days Reason for consult: Follow-up assessment;Late-preterm 34-36.6wks;Infant < 6lbs  Visited with family of 83 hours old LPI female; Ms. Kenaston is a P3 and has some experience breastfeeding. She reports pumping and breastfeeding are going well, she's been consisently taking baby to breast and supplementing afterwards, he takes about 40-45 ml after feedings at the breast. Couplet is getting discharged today. Reviewed discharge education and the importance of consistent pumping after feedings at the breast for the prevention of engorgement at the protect her supply. Ms. Pribnow politely declined an [facility] OP referral, but has their contact info in case she needs to reach out. FOB, grandparents and big brothers present. All questions and concerns answered, family to contact [facility] services PRN.  Feeding Mother's Current Feeding Choice: Breast Milk  Lactation Tools Discussed/Used Tools: Pump;Flanges;Hands-free pumping top Flange Size: 21 Breast pump type: Double-Electric Breast Pump Pump Education: Setup, frequency, and cleaning;Milk Storage Reason for Pumping: LPI Pumping frequency: 4 times/24 hours Pumped volume: 120 mL (after breastfeeding)  Interventions Interventions: Breast feeding basics reviewed;DEBP;Education  Discharge Discharge Education: Engorgement and breast care;Warning signs for feeding baby;Outpatient recommendation Pump: Personal (Medela MaxFlow)  Consult Status Consult Status: Complete Date: 01/20/23 Follow-up type: Call as needed   Shawnise Peterkin Venetia Constable 01/20/2023, 2:08 PM

## 2023-01-23 ENCOUNTER — Ambulatory Visit: Payer: No Typology Code available for payment source

## 2023-01-23 LAB — GC/CHLAMYDIA PROBE AMP (~~LOC~~) NOT AT ARMC
Chlamydia: NEGATIVE
Comment: NEGATIVE
Comment: NORMAL
Neisseria Gonorrhea: POSITIVE — AB

## 2023-01-26 ENCOUNTER — Encounter: Payer: No Typology Code available for payment source | Admitting: Obstetrics and Gynecology

## 2023-01-27 ENCOUNTER — Ambulatory Visit: Payer: No Typology Code available for payment source | Admitting: Obstetrics and Gynecology

## 2023-01-27 VITALS — BP 130/87 | HR 80

## 2023-01-27 DIAGNOSIS — O1413 Severe pre-eclampsia, third trimester: Secondary | ICD-10-CM

## 2023-01-27 DIAGNOSIS — O1415 Severe pre-eclampsia, complicating the puerperium: Secondary | ICD-10-CM

## 2023-01-27 MED ORDER — AMLODIPINE BESYLATE 5 MG PO TABS
5.0000 mg | ORAL_TABLET | Freq: Every day | ORAL | 2 refills | Status: DC
Start: 2023-01-27 — End: 2023-05-05

## 2023-01-27 NOTE — Progress Notes (Signed)
Severe HA with nifedipine. Will switch to amlodipine 5mg  daily with plan for BP check in 1 week.  Patient was otherwise assessed and managed by nursing staff during this encounter. I have reviewed the chart and agree with the documentation and plan. I have also made any necessary editorial changes.  Lennart Pall, MD 01/27/2023 12:29 PM

## 2023-01-27 NOTE — Progress Notes (Signed)
Subjective:  Nicole Mclean is a 27 y.o. female here for BP check and incision check.  Hypertension ROS: taking medications as instructed, medication side effects include: headaches and nausea, no TIA's, no chest pain on exertion, no dyspnea on exertion, and no swelling of ankles.   Pt reports incision covered  Objective:  BP 130/87   Pulse 80   LMP 05/06/2022 (Exact Date)   Appearance alert, well appearing, and in no distress. Incision: Prevena removed with adhesive remover. Incision intact with no signs of infection. Incision appears to be healing well.    Assessment:   Blood Pressure today in office needs further observation.  Incision healed nicely, no signs of infection, scar looks good  Plan:  Discontinue Nifedipine and start Amlodipine 5mg  per Dr. Berton Lan.  Patient to follow up for blood pressure check in 1 week (02/02/2023)  Incision care instructions given to patient.

## 2023-02-13 ENCOUNTER — Encounter: Payer: Self-pay | Admitting: Obstetrics

## 2023-02-13 MED ORDER — METOCLOPRAMIDE HCL 10 MG PO TABS: 10.0000 mg | ORAL_TABLET | Freq: Three times a day (TID) | ORAL | 0 refills | Status: DC

## 2023-02-28 ENCOUNTER — Ambulatory Visit: Payer: No Typology Code available for payment source | Admitting: Obstetrics & Gynecology

## 2023-03-07 ENCOUNTER — Encounter: Payer: Self-pay | Admitting: Obstetrics and Gynecology

## 2023-03-07 ENCOUNTER — Telehealth: Payer: No Typology Code available for payment source | Admitting: Obstetrics and Gynecology

## 2023-03-07 DIAGNOSIS — Z30011 Encounter for initial prescription of contraceptive pills: Secondary | ICD-10-CM

## 2023-03-07 DIAGNOSIS — Z309 Encounter for contraceptive management, unspecified: Secondary | ICD-10-CM | POA: Insufficient documentation

## 2023-03-07 MED ORDER — NORETHINDRONE 0.35 MG PO TABS
1.0000 | ORAL_TABLET | Freq: Every day | ORAL | 11 refills | Status: DC
Start: 2023-03-07 — End: 2023-05-05

## 2023-03-07 NOTE — Progress Notes (Signed)
Provider location: [facility] at [facility]   Patient location: Home  I connected withNAME@ on 03/07/23 at  2:50 PM EDT by Mychart Video Encounter and verified that I am speaking with the correct person using two identifiers.       I discussed the limitations, risks, security and privacy concerns of performing an evaluation and management service virtually and the availability of in person appointments. I also discussed with the patient that there may be a patient responsible charge related to this service. The patient expressed understanding and agreed to proceed.  Post Partum Visit Note Subjective:   Nicole Mclean is a 27 y.o. [phone] female who presents for a postpartum visit. She is 7 weeks postpartum following a primary cesarean section.  I have fully reviewed the prenatal and intrapartum course. The delivery was at [redacted] weeks gestational weeks.  Anesthesia: spinal. Postpartum course has been unremarkable. Baby is doing well. Baby is feeding by both breast and bottle - Enfamil AR. Bleeding staining only. Bowel function is normal. Bladder function is normal. Patient is not sexually active. Contraception method is none. Pt would like a non-hormonal BC since breastfeeding.   Postpartum depression screening: negative.   The pregnancy intention screening data noted above was reviewed. Potential methods of contraception were discussed. The patient elected to proceed with No data recorded.    Medical records  Review of Systems Pertinent items are noted in HPI.  Objective:  There were no vitals taken for this visit.    General:  Alert, oriented and cooperative. Patient is in no acute distress.  Respiratory: Normal respiratory effort, no problems with respiration noted  Mental Status: Normal mood and affect. Normal behavior. Normal judgment and thought content.  Rest of physical exam deferred due to type of encounter   Assessment:    NL postpartum exam.  Plan:   Essential components of care per ACOG recommendations:  1.  Mood and well being: Patient with negative depression screening today. Reviewed local resources for support.  - Patient does not use tobacco.  - hx of drug use? No    2. Infant care and feeding:  -Patient currently breastmilk feeding? Yes  If breastmilk feeding discussed return to work and pumping. If needed, patient was provided letter for work to allow for every 2-3 hr pumping breaks, and to be granted a private location to express breastmilk and refrigerated area to store breastmilk. Reviewed importance of draining breast regularly to support lactation. -Social determinants of health (SDOH) reviewed in EPIC. No concerns  3. Sexuality, contraception and birth spacing - Patient does not want a pregnancy in the next year.  Desired family size is uncertain children.  - Reviewed forms of contraception in tiered fashion. Patient desired oral progesterone-only contraceptive today.   - Discussed birth spacing of 18 months  4. Sleep and fatigue -Encouraged family/partner/community support of 4 hrs of uninterrupted sleep to help with mood and fatigue  5. Physical Recovery  - Discussed patients delivery and complications - Patient had a c section.Patient expressed understanding - Patient has urinary incontinence? No - Patient is safe to resume physical and sexual activity  6.  Health Maintenance - Last pap smear done 1/24 and was normal with negative HPV.   7. Chronic Disease  Pt had SPEC and was on BP medication. Now off. BP today 140/82 Will have pt come to office for nurse visit for BP check and determine if medication need and follow up with PCP  I provided  10 minutes of face-to-face time during this encounter.       Nettie Elm, MD Center for [facility], [facility]

## 2023-03-07 NOTE — Addendum Note (Signed)
Addended by: Hermina Staggers on: 03/07/2023 03:30 PM   Modules accepted: Level of Service

## 2023-03-14 ENCOUNTER — Ambulatory Visit: Payer: No Typology Code available for payment source

## 2023-03-16 ENCOUNTER — Telehealth: Payer: No Typology Code available for payment source | Admitting: *Deleted

## 2023-03-16 DIAGNOSIS — Z0130 Encounter for examination of blood pressure without abnormal findings: Secondary | ICD-10-CM

## 2023-03-16 DIAGNOSIS — O1413 Severe pre-eclampsia, third trimester: Secondary | ICD-10-CM

## 2023-03-16 NOTE — Progress Notes (Addendum)
Virtual Visit. Pt obtained BP reading of 120/83 at home and has not been taking Nifedipine for 2 weeks.  Subjective:  Nicole Mclean is a 27 y.o. female here for BP check.   Hypertension ROS: no TIA's, no chest pain on exertion, no dyspnea on exertion, and no swelling of ankles.    Objective:  BP 120/83 per pt reading. Oriented to person, place, and time.Denies acute concerns. General exam BP noted to be well controlled today in office.    Assessment:   Blood Pressure well controlled.   Plan:  Per Dr. Jolayne Panther: Discontinue Nifedipine . Schedule a follow up appt with PCP within 3 months for BP check and provider assessment. Return to the office for annual exam after 03/06/24. Call or contact office for any further ob/gyn concerns. Pt verbalized understanding and all questions were answered.

## 2023-03-29 ENCOUNTER — Other Ambulatory Visit (HOSPITAL_COMMUNITY)
Admission: RE | Admit: 2023-03-29 | Discharge: 2023-03-29 | Disposition: A | Discharge status: Home / Self Care | Payer: No Typology Code available for payment source | Source: Ambulatory Visit | Attending: Obstetrics and Gynecology | Admitting: Obstetrics and Gynecology

## 2023-03-29 ENCOUNTER — Ambulatory Visit: Payer: No Typology Code available for payment source | Admitting: Emergency Medicine

## 2023-03-29 VITALS — BP 128/87 | HR 79 | Wt 247.0 lb

## 2023-03-29 DIAGNOSIS — N898 Other specified noninflammatory disorders of vagina: Secondary | ICD-10-CM | POA: Diagnosis present

## 2023-03-29 NOTE — Progress Notes (Signed)
SUBJECTIVE:  27 y.o. female complains of creamy vaginal discharge for 1 day. Denies abnormal vaginal bleeding or significant pelvic pain or fever. No UTI symptoms. Denies history of known exposure to STD.   OBJECTIVE:  She appears well, afebrile. Urine dipstick: not done.  ASSESSMENT:  Vaginal Discharge  Vaginal Odor   PLAN:  GC, chlamydia, trichomonas, BVAG, CVAG probe sent to lab. Treatment: To be determined once lab results are received ROV prn if symptoms persist or worsen.   Pt notes BP of 120/94 yesterday with headache.

## 2023-03-30 ENCOUNTER — Telehealth: Payer: Self-pay

## 2023-03-30 LAB — CERVICOVAGINAL ANCILLARY ONLY
Bacterial Vaginitis (gardnerella): POSITIVE — AB
Candida Glabrata: NEGATIVE
Candida Vaginitis: NEGATIVE
Chlamydia: NEGATIVE
Comment: NEGATIVE
Comment: NEGATIVE
Comment: NEGATIVE
Comment: NEGATIVE
Comment: NEGATIVE
Comment: NORMAL
Neisseria Gonorrhea: NEGATIVE
Trichomonas: NEGATIVE

## 2023-03-30 MED ORDER — METRONIDAZOLE 500 MG PO TABS: 500.0000 mg | ORAL_TABLET | Freq: Two times a day (BID) | ORAL | 0 refills | Status: DC

## 2023-03-30 NOTE — Telephone Encounter (Signed)
Advised pt of results and rx sent. 

## 2023-04-24 ENCOUNTER — Ambulatory Visit (HOSPITAL_COMMUNITY): Payer: Self-pay

## 2023-04-26 ENCOUNTER — Ambulatory Visit: Payer: Self-pay

## 2023-05-01 ENCOUNTER — Emergency Department (HOSPITAL_COMMUNITY): Payer: No Typology Code available for payment source

## 2023-05-01 ENCOUNTER — Encounter (HOSPITAL_COMMUNITY): Payer: Self-pay

## 2023-05-01 ENCOUNTER — Other Ambulatory Visit: Payer: Self-pay

## 2023-05-01 ENCOUNTER — Emergency Department (HOSPITAL_COMMUNITY)
Admission: EM | Admit: 2023-05-01 | Discharge: 2023-05-01 | Disposition: A | Discharge status: Home / Self Care | Payer: No Typology Code available for payment source | Attending: Emergency Medicine | Admitting: Emergency Medicine

## 2023-05-01 DIAGNOSIS — K921 Melena: Secondary | ICD-10-CM | POA: Insufficient documentation

## 2023-05-01 DIAGNOSIS — Z1152 Encounter for screening for COVID-19: Secondary | ICD-10-CM | POA: Diagnosis not present

## 2023-05-01 LAB — COMPREHENSIVE METABOLIC PANEL
ALT: 26 U/L (ref 0–44)
AST: 18 U/L (ref 15–41)
Albumin: 3.9 g/dL (ref 3.5–5.0)
Alkaline Phosphatase: 100 U/L (ref 38–126)
Anion gap: 8 (ref 5–15)
BUN: 11 mg/dL (ref 6–20)
CO2: 25 mmol/L (ref 22–32)
Calcium: 9.3 mg/dL (ref 8.9–10.3)
Chloride: 104 mmol/L (ref 98–111)
Creatinine, Ser: 0.65 mg/dL (ref 0.44–1.00)
GFR, Estimated: >60 mL/min (ref >60)
Glucose, Bld: 91 mg/dL (ref 70–99)
Potassium: 3.7 mmol/L (ref 3.5–5.1)
Sodium: 137 mmol/L (ref 135–145)
Total Bilirubin: 0.4 mg/dL (ref 0.3–1.2)
Total Protein: 7.2 g/dL (ref 6.5–8.1)

## 2023-05-01 LAB — CBC
HCT: 33.7 % — ABNORMAL LOW (ref 36.0–46.0)
Hemoglobin: 11.1 g/dL — ABNORMAL LOW (ref 12.0–15.0)
MCH: 28.1 pg (ref 26.0–34.0)
MCHC: 32.9 g/dL (ref 30.0–36.0)
MCV: 85.3 fL (ref 80.0–100.0)
Platelets: 328 10*3/uL (ref 150–400)
RBC: 3.95 MIL/uL (ref 3.87–5.11)
RDW: 13 % (ref 11.5–15.5)
WBC: 7.5 10*3/uL (ref 4.0–10.5)
nRBC: 0 % (ref 0.0–0.2)

## 2023-05-01 LAB — TYPE AND SCREEN
ABO/RH(D): O POS
Antibody Screen: NEGATIVE

## 2023-05-01 LAB — HCG, SERUM, QUALITATIVE: Preg, Serum: NEGATIVE

## 2023-05-01 LAB — RESP PANEL BY RT-PCR (RSV, FLU A&B, COVID)  RVPGX2
Influenza A by PCR: NEGATIVE
Influenza B by PCR: NEGATIVE
Resp Syncytial Virus by PCR: NEGATIVE
SARS Coronavirus 2 by RT PCR: NEGATIVE

## 2023-05-01 MED ORDER — IOHEXOL 350 MG/ML SOLN
75.0000 mL | Freq: Once | INTRAVENOUS | Status: AC | PRN
  Administered 2023-05-01: 75 mL via INTRAVENOUS

## 2023-05-01 NOTE — ED Provider Notes (Signed)
[place] EMERGENCY DEPARTMENT AT [facility] Provider Note   CSN: [id number] Arrival date & time: 05/01/23  1001     History  Chief Complaint  Patient presents with   Blood In Stools    Nicole Mclean is a 27 y.o. female.  This is a 27 year old female who is here today for abdominal pain and blood in stools.  Patient says that she has had this happen previously, had a colonoscopy and was told that she has internal hemorrhoids.  She says that beginning yesterday she began to have some crampy abdominal pain, and had some blood in her stool today.  She denies fever or chills.  Patient currently 3 months postpartum.        Home Medications Prior to Admission medications   Medication Sig Start Date End Date Taking? Authorizing Provider  Prenatal Vit-Fe Fumarate-FA (PRENATAL VITAMIN PLUS LOW IRON) 27-1 MG TABS Take 1 tablet by mouth daily. 07/25/22  Yes Constant, Peggy, MD  amLODipine (NORVASC) 5 MG tablet Take 1 tablet (5 mg total) by mouth daily. Patient not taking: Reported on 03/29/2023 01/27/23   Lennart Pall, MD  ferrous sulfate 325 (65 FE) MG tablet Take 1 tablet (325 mg total) by mouth every other day. Patient not taking: Reported on 05/01/2023 01/20/23   Milas Hock, MD  ibuprofen (ADVIL) 600 MG tablet Take 1 tablet (600 mg total) by mouth every 6 (six) hours. Patient not taking: Reported on 05/01/2023 01/20/23   Milas Hock, MD  metoCLOPramide (REGLAN) 10 MG tablet Take 1 tablet (10 mg total) by mouth every 8 (eight) hours for 10 days. 02/13/23 02/23/23  Lennart Pall, MD  metroNIDAZOLE (FLAGYL) 500 MG tablet Take 1 tablet (500 mg total) by mouth 2 (two) times daily. Patient not taking: Reported on 05/01/2023 03/30/23   Warden Fillers, MD  norethindrone (MICRONOR) 0.35 MG tablet Take 1 tablet (0.35 mg total) by mouth daily. Patient not taking: Reported on 05/01/2023 03/07/23   Hermina Staggers, MD      Allergies    Patient has no known allergies.     Review of Systems   Review of Systems  Physical Exam Updated Vital Signs BP 114/81   Pulse 66   Temp 98.2 F (36.8 C)   Resp 18   Ht 5\' 1"  (1.549 m)   Wt 112 kg   SpO2 100%   Breastfeeding Yes   BMI 46.67 kg/m  Physical Exam Vitals reviewed.  Cardiovascular:     Rate and Rhythm: Normal rate.  Pulmonary:     Effort: Pulmonary effort is normal.  Abdominal:     General: Abdomen is flat. There is no distension.     Palpations: Abdomen is soft. There is no mass.     Tenderness: There is no abdominal tenderness. There is no guarding.  Musculoskeletal:        General: Normal range of motion.  Neurological:     General: No focal deficit present.     Mental Status: She is alert.     Gait: Gait normal.     ED Results / Procedures / Treatments   Labs (all labs ordered are listed, but only abnormal results are displayed) Labs Reviewed  CBC - Abnormal; Notable for the following components:      Result Value   Hemoglobin 11.1 (*)    HCT 33.7 (*)    All other components within normal limits  RESP PANEL BY RT-PCR (RSV, FLU A&B, COVID)  RVPGX2  COMPREHENSIVE METABOLIC PANEL  HCG, SERUM, QUALITATIVE  POC OCCULT BLOOD, ED  TYPE AND SCREEN    EKG None  Radiology No results found.  Procedures Procedures    Medications Ordered in ED Medications  iohexol (OMNIPAQUE) 350 MG/ML injection 75 mL (75 mLs Intravenous Contrast Given 05/01/23 1518)    ED Course/ Medical Decision Making/ A&P                                 Medical Decision Making 27 year old female here today for blood in stool.  Differential diagnoses include colitis, hemorrhoidal bleeding, less likely mass, less likely brisk GI bleed.  Plan-on exam, patient abdomen overall soft.  Will check some routine blood work.  With her discomfort, my suspicion is that this is either diverticular or colitis causing the bleeding.  Will obtain imaging.  Patient had a colonoscopy in January of this past year.  Rectal  exam deferred due to patient's reported symptoms, known colonoscopy and history of internal hemorrhoids.  Description of dark blood, clinical management unlikely to change with digital rectal exam.  Reassessment-patient's hemoglobin 11.1.  Nontachycardic.  Will be signed out to Dr. Criss Alvine pending CT abdomen and pelvis.  Amount and/or Complexity of Data Reviewed Labs: ordered. Radiology: ordered.  Risk Prescription drug management.           Final Clinical Impression(s) / ED Diagnoses Final diagnoses:  Blood in stool    Rx / DC Orders ED Discharge Orders     None         Arletha Pili, DO 05/01/23 1632

## 2023-05-01 NOTE — ED Provider Notes (Signed)
Patient care transferred to me.  CT scan shows her appendix is mildly enlarged but there is no other signs that there is obvious appendicitis.  She has some vague upper abdominal discomfort diffusely but no lower abdominal discomfort and specifically no right lower quadrant tenderness on my exam.  On Dr. Andria Meuse exam he reports she had left-sided tenderness.  I think appendicitis is less likely, especially in the setting of blood in the stool.  However I discussed that if her symptoms are not better by tomorrow or if at any point they worsen and she develops right lower quadrant pain that she is to return to the ER and get a repeat CT.  She verbalized understanding is okay with the plan.  Will discharge home to follow-up with her gastroenterologist.   Pricilla Loveless, MD 05/01/23 1743

## 2023-05-01 NOTE — ED Notes (Signed)
Vitals assessed in waiting room

## 2023-05-01 NOTE — Discharge Instructions (Addendum)
Your CT scan shows you have a mildly enlarged appendix.  There is no indication this is inflamed which would be appendicitis.  However it is possible this is very early and so if you still have abdominal pain tomorrow then you should come back and get a repeat CT scan.  If your abdominal pain worsens at any point or especially if it moves down to your right lower abdomen, or if you develop any other concerns then you should return immediately and get evaluated for appendicitis.  Otherwise, follow-up with your gastroenterologist in regards to your blood in the stools.

## 2023-05-01 NOTE — ED Triage Notes (Signed)
Pt is 3 months post-partum. Pt had a c-section. Pt woke up this morning with abdominal pain and dark and blood stools. Pt has had dizziness. Pt has had nausea, no vomiting.

## 2023-05-04 ENCOUNTER — Other Ambulatory Visit: Payer: Self-pay

## 2023-05-04 ENCOUNTER — Encounter (HOSPITAL_BASED_OUTPATIENT_CLINIC_OR_DEPARTMENT_OTHER): Payer: Self-pay | Admitting: Urology

## 2023-05-04 ENCOUNTER — Inpatient Hospital Stay (HOSPITAL_BASED_OUTPATIENT_CLINIC_OR_DEPARTMENT_OTHER)
Admission: EM | Admit: 2023-05-04 | Discharge: 2023-05-10 | DRG: 378 | Disposition: A | Discharge status: Other Acute Inpatient Hospital | Payer: No Typology Code available for payment source | Attending: Internal Medicine | Admitting: Internal Medicine

## 2023-05-04 ENCOUNTER — Emergency Department (HOSPITAL_BASED_OUTPATIENT_CLINIC_OR_DEPARTMENT_OTHER): Payer: No Typology Code available for payment source

## 2023-05-04 DIAGNOSIS — H538 Other visual disturbances: Secondary | ICD-10-CM | POA: Diagnosis present

## 2023-05-04 DIAGNOSIS — Z6841 Body Mass Index (BMI) 40.0 and over, adult: Secondary | ICD-10-CM

## 2023-05-04 DIAGNOSIS — R519 Headache, unspecified: Secondary | ICD-10-CM | POA: Diagnosis present

## 2023-05-04 DIAGNOSIS — K921 Melena: Principal | ICD-10-CM | POA: Diagnosis present

## 2023-05-04 DIAGNOSIS — Z833 Family history of diabetes mellitus: Secondary | ICD-10-CM

## 2023-05-04 DIAGNOSIS — Z87440 Personal history of urinary (tract) infections: Secondary | ICD-10-CM

## 2023-05-04 DIAGNOSIS — R0789 Other chest pain: Secondary | ICD-10-CM | POA: Diagnosis present

## 2023-05-04 DIAGNOSIS — Z818 Family history of other mental and behavioral disorders: Secondary | ICD-10-CM

## 2023-05-04 DIAGNOSIS — D62 Acute posthemorrhagic anemia: Secondary | ICD-10-CM | POA: Diagnosis present

## 2023-05-04 DIAGNOSIS — D649 Anemia, unspecified: Principal | ICD-10-CM

## 2023-05-04 DIAGNOSIS — Z98891 History of uterine scar from previous surgery: Secondary | ICD-10-CM

## 2023-05-04 DIAGNOSIS — I10 Essential (primary) hypertension: Secondary | ICD-10-CM | POA: Diagnosis present

## 2023-05-04 DIAGNOSIS — E66813 Obesity, class 3: Secondary | ICD-10-CM | POA: Diagnosis present

## 2023-05-04 DIAGNOSIS — K388 Other specified diseases of appendix: Secondary | ICD-10-CM | POA: Diagnosis present

## 2023-05-04 DIAGNOSIS — N760 Acute vaginitis: Secondary | ICD-10-CM | POA: Diagnosis present

## 2023-05-04 DIAGNOSIS — Z79899 Other long term (current) drug therapy: Secondary | ICD-10-CM

## 2023-05-04 DIAGNOSIS — K922 Gastrointestinal hemorrhage, unspecified: Secondary | ICD-10-CM | POA: Diagnosis present

## 2023-05-04 DIAGNOSIS — E876 Hypokalemia: Secondary | ICD-10-CM | POA: Diagnosis present

## 2023-05-04 DIAGNOSIS — Z8719 Personal history of other diseases of the digestive system: Secondary | ICD-10-CM

## 2023-05-04 DIAGNOSIS — D560 Alpha thalassemia: Secondary | ICD-10-CM | POA: Diagnosis present

## 2023-05-04 DIAGNOSIS — Z8249 Family history of ischemic heart disease and other diseases of the circulatory system: Secondary | ICD-10-CM

## 2023-05-04 LAB — CBC
HCT: 23.6 % — ABNORMAL LOW (ref 36.0–46.0)
Hemoglobin: 7.6 g/dL — ABNORMAL LOW (ref 12.0–15.0)
MCH: 27.5 pg (ref 26.0–34.0)
MCHC: 32.2 g/dL (ref 30.0–36.0)
MCV: 85.5 fL (ref 80.0–100.0)
Platelets: 309 10*3/uL (ref 150–400)
RBC: 2.76 MIL/uL — ABNORMAL LOW (ref 3.87–5.11)
RDW: 13.7 % (ref 11.5–15.5)
WBC: 8.8 10*3/uL (ref 4.0–10.5)
nRBC: 0 % (ref 0.0–0.2)

## 2023-05-04 LAB — COMPREHENSIVE METABOLIC PANEL
ALT: 21 U/L (ref 0–44)
AST: 15 U/L (ref 15–41)
Albumin: 3.9 g/dL (ref 3.5–5.0)
Alkaline Phosphatase: 96 U/L (ref 38–126)
Anion gap: 14 (ref 5–15)
BUN: 17 mg/dL (ref 6–20)
CO2: 24 mmol/L (ref 22–32)
Calcium: 8.8 mg/dL — ABNORMAL LOW (ref 8.9–10.3)
Chloride: 99 mmol/L (ref 98–111)
Creatinine, Ser: 0.68 mg/dL (ref 0.44–1.00)
GFR, Estimated: >60 mL/min (ref >60)
Glucose, Bld: 97 mg/dL (ref 70–99)
Potassium: 3.4 mmol/L — ABNORMAL LOW (ref 3.5–5.1)
Sodium: 137 mmol/L (ref 135–145)
Total Bilirubin: 0.3 mg/dL (ref 0.3–1.2)
Total Protein: 7.1 g/dL (ref 6.5–8.1)

## 2023-05-04 LAB — OCCULT BLOOD X 1 CARD TO LAB, STOOL: Fecal Occult Bld: POSITIVE — AB

## 2023-05-04 MED ORDER — PANTOPRAZOLE SODIUM 40 MG IV SOLR
40.0000 mg | Freq: Once | INTRAVENOUS | Status: AC
  Administered 2023-05-05: 40 mg via INTRAVENOUS
  Filled 2023-05-04: qty 10

## 2023-05-04 MED ORDER — MORPHINE SULFATE (PF) 4 MG/ML IV SOLN
4.0000 mg | Freq: Once | INTRAVENOUS | Status: AC
  Administered 2023-05-04: 4 mg via INTRAVENOUS
  Filled 2023-05-04: qty 1

## 2023-05-04 MED ORDER — IOHEXOL 300 MG/ML  SOLN
100.0000 mL | Freq: Once | INTRAMUSCULAR | Status: AC | PRN
  Administered 2023-05-04: 100 mL via INTRAVENOUS

## 2023-05-04 MED ORDER — ONDANSETRON HCL 4 MG/2ML IJ SOLN
4.0000 mg | Freq: Once | INTRAMUSCULAR | Status: AC
  Administered 2023-05-04: 4 mg via INTRAVENOUS
  Filled 2023-05-04: qty 2

## 2023-05-04 MED ORDER — LACTATED RINGERS IV BOLUS
1000.0000 mL | Freq: Once | INTRAVENOUS | Status: AC
  Administered 2023-05-05: 1000 mL via INTRAVENOUS

## 2023-05-04 NOTE — ED Notes (Signed)
Called [facility] for transport @ 23:38  No Current ETA...  ED Nurse will call floor for report

## 2023-05-04 NOTE — ED Provider Notes (Signed)
[place] [facility]ERGENC[facility] DEPARTMENT AT [facility] [place] Provider Note   CSN: [id number] Arrival date & time: 05/04/23  1955     History  Chief Complaint  Patient presents with   Melena    Nicole Mclean is a 27 y.o. female with hypertension, iron deficiency anemia, and internal hemorrhoids presents the ED today for dizziness, abdominal pain, black stools.  Patient reports dark and tarry stools for the past 3 days with associated lower abdominal pain.  Patient was evaluated in the ED at that time, and imaging showed inflammation of the appendix but no other acute findings.  Her hemoglobin then was 11.1.  She was told to follow-up with GI or return to the ED if her symptoms worsened.  She has since then developed worsening abdominal pain, dizziness, intermittent blurred vision, and left flank pain.     Home Medications Prior to Admission medications   Medication Sig Start Date End Date Taking? Authorizing Provider  amLODipine (NORVASC) 5 MG tablet Take 1 tablet (5 mg total) by mouth daily. Patient not taking: Reported on 03/29/2023 01/27/23   Lennart Pall, MD  ferrous sulfate 325 (65 FE) MG tablet Take 1 tablet (325 mg total) by mouth every other day. Patient not taking: Reported on 05/01/2023 01/20/23   Milas Hock, MD  ibuprofen (ADVIL) 600 MG tablet Take 1 tablet (600 mg total) by mouth every 6 (six) hours. Patient not taking: Reported on 05/01/2023 01/20/23   Milas Hock, MD  metoCLOPramide (REGLAN) 10 MG tablet Take 1 tablet (10 mg total) by mouth every 8 (eight) hours for 10 days. 02/13/23 02/23/23  Lennart Pall, MD  metroNIDAZOLE (FLAGYL) 500 MG tablet Take 1 tablet (500 mg total) by mouth 2 (two) times daily. Patient not taking: Reported on 05/01/2023 03/30/23   Warden Fillers, MD  norethindrone (MICRONOR) 0.35 MG tablet Take 1 tablet (0.35 mg total) by mouth daily. Patient not taking: Reported on 05/01/2023 03/07/23   Hermina Staggers, MD  Prenatal Vit-Fe  Fumarate-FA (PRENATAL VITAMIN PLUS LOW IRON) 27-1 MG TABS Take 1 tablet by mouth daily. 07/25/22   Constant, Peggy, MD      Allergies    Patient has no known allergies.    Review of Systems   Review of Systems  Gastrointestinal:  Positive for abdominal pain and blood in stool.  All other systems reviewed and are negative.   Physical Exam Updated Vital Signs BP 94/60   Pulse (!) 59   Temp 98 F (36.7 C)   Resp 15   Ht 5\' 1"  (1.549 m)   Wt 112 kg   SpO2 100%   Breastfeeding Yes   BMI 46.65 kg/m  Physical Exam Vitals and nursing note reviewed. Exam conducted with a chaperone present.  Constitutional:      Appearance: Normal appearance. She is ill-appearing.  HENT:     Head: Normocephalic and atraumatic.     Mouth/Throat:     Mouth: Mucous membranes are moist.  Eyes:     Conjunctiva/sclera: Conjunctivae normal.     Pupils: Pupils are equal, round, and reactive to light.  Cardiovascular:     Rate and Rhythm: Normal rate and regular rhythm.     Pulses: Normal pulses.     Heart sounds: Normal heart sounds.  Pulmonary:     Effort: Pulmonary effort is normal.     Breath sounds: Normal breath sounds.  Abdominal:     Palpations: Abdomen is soft.     Tenderness: There is  abdominal tenderness.     Comments: Tenderness to the lower abdomen as well as to the left flank.  Genitourinary:    Rectum: Normal. Guaiac result positive.  Skin:    General: Skin is warm and dry.     Findings: No rash.  Neurological:     General: No focal deficit present.     Mental Status: She is alert.     Sensory: No sensory deficit.     Motor: No weakness.  Psychiatric:        Mood and Affect: Mood normal.        Behavior: Behavior normal.    ED Results / Procedures / Treatments   Labs (all labs ordered are listed, but only abnormal results are displayed) Labs Reviewed  COMPREHENSIVE METABOLIC PANEL - Abnormal; Notable for the following components:      Result Value   Potassium 3.4 (*)     Calcium 8.8 (*)    All other components within normal limits  CBC - Abnormal; Notable for the following components:   RBC 2.76 (*)    Hemoglobin 7.6 (*)    HCT 23.6 (*)    All other components within normal limits  OCCULT BLOOD X 1 CARD TO LAB, STOOL - Abnormal; Notable for the following components:   Fecal Occult Bld POSITIVE (*)    All other components within normal limits  HCG, SERUM, QUALITATIVE    EKG None  Radiology CT ABDOMEN PELVIS W CONTRAST  Result Date: 05/04/2023 CLINICAL DATA:  Acute nonlocalized abdominal pain EXAM: CT ABDOMEN AND PELVIS WITH CONTRAST TECHNIQUE: Multidetector CT imaging of the abdomen and pelvis was performed using the standard protocol following bolus administration of intravenous contrast. RADIATION DOSE REDUCTION: This exam was performed according to the departmental dose-optimization program which includes automated exposure control, adjustment of the mA and/or kV according to patient size and/or use of iterative reconstruction technique. CONTRAST:  OMNIPAQUE IOHEXOL 300 MG/ML  SOLN COMPARISON:  CT abdomen and pelvis 05/01/2023 FINDINGS: Lower chest: No acute abnormality. Hepatobiliary: Unremarkable liver. Normal gallbladder. No biliary dilation. Pancreas: Unremarkable. Spleen: Unremarkable. Adrenals/Urinary Tract: Normal adrenal glands. No urinary calculi or hydronephrosis. Bladder is unremarkable. Stomach/Bowel: Normal caliber large and small bowel. No bowel wall thickening. Stomach is within normal limits. The appendix again measures 9 mm in diameter. No hyperemia, periappendiceal stranding or free fluid. Vascular/Lymphatic: No significant vascular findings are present. No enlarged abdominal or pelvic lymph nodes. Reproductive: Unremarkable. Other: No free intraperitoneal fluid or air. Musculoskeletal: No acute fracture. IMPRESSION: No acute abnormality in the abdomen or pelvis. Unchanged mild enlargement of the appendix without inflammatory changes.  Electronically Signed   By: Minerva Fester M.D.   On: 05/04/2023 21:59    Procedures Procedures: not indicated.   Medications Ordered in ED Medications  ondansetron (ZOFRAN) injection 4 mg (4 mg Intravenous Given 05/04/23 2152)  morphine (PF) 4 MG/ML injection 4 mg (4 mg Intravenous Given 05/04/23 2152)  iohexol (OMNIPAQUE) 300 MG/ML solution 100 mL (100 mLs Intravenous Contrast Given 05/04/23 2145)  pantoprazole (PROTONIX) injection 40 mg (40 mg Intravenous Given 05/05/23 0009)  lactated ringers bolus 1,000 mL (1,000 mLs Intravenous New Bag/Given 05/05/23 0009)    ED Course/ Medical Decision Making/ A&P                                 Medical Decision Making Amount and/or Complexity of Data Reviewed Labs: ordered. Radiology: ordered.  Risk  Prescription drug management. Decision regarding hospitalization.   This patient presents to the ED for concern of melena, this involves an extensive number of treatment options, and is a complaint that carries with it a high risk of complications and morbidity.   Differential diagnosis includes: gastric ulcer, diverticulitis, occult GI bleed, etc.   Comorbidities  See HPI above   Additional History  Additional history obtained from previous ED records.   Lab Tests  I ordered and personally interpreted labs.  The pertinent results include:   Hemoglobin of 7.6 otherwise CBC is within normal limits Positive fecal occult test CMP is within normal limits   Imaging Studies  I ordered imaging studies including Ct abdomen/pelvis with contrast  I independently visualized and interpreted imaging which showed: No acute abnormality in the abdomen or pelvis.  Unchanged mild enlargement of the appendix without inflammatory amatory changes. I agree with the radiologist interpretation   Consultations  I requested consultation with hospitalist,  and discussed lab and imaging findings as well as pertinent plan - they recommend:  Admission at [facility] for further workup. Per Dr. Francesco Runner request, I secure chat messaged the on-call GI provider, Dr. Ewing Schlein, to ask if they would see patient tomorrow. I spoke with Dr. Ewing Schlein, who said that I need to speak with the other group ([facility]). I sent a message to PA listed on Amion.   Problem List / ED Course / Critical Interventions / Medication Management  Melena I ordered medications including: Morphine and Zofran for pain and nausea Reevaluation of the patient after these medicines showed that the patient improved. I have reviewed the patients home medicines and have made adjustments as needed.   Social Determinants of Health  Access to healthcare   Test / Admission - Considered  Discussed findings with patient. She is agreeable for admission.       Final Clinical Impression(s) / ED Diagnoses Final diagnoses:  Symptomatic anemia  Melena    Rx / DC Orders ED Discharge Orders     None         Maxwell Marion, PA-C 05/05/23 0114    Laurence Spates, MD 05/05/23 [phone]

## 2023-05-04 NOTE — ED Triage Notes (Signed)
Pt states black and bloody stools that started Monday  Over past 24 hours is having back pain and near syncope

## 2023-05-04 NOTE — ED Provider Notes (Signed)
27 year old female presenting for abdominal pain, melena.  She since Monday has had multiple episodes of melena and generalized abdominal pain.  Was seen here recently, had a CT scan with some nonspecific changes to her appendix.  CT scan today shows stable appendix changes.  Her pain is more diffuse, and she is having gross melena.  I more concern for upper GI bleeding.  Hemoglobin is downtrending today, mild tachycardia but improved on my examination.  Blood pressure remained stable.  Giving PPI, IV fluids and plan for admission to the hospitalist for further management of upper GI bleeding.   Laurence Spates, MD 05/05/23 [phone]

## 2023-05-04 NOTE — Progress Notes (Signed)
Plan of Care Note for accepted transfer   Patient: Nicole Mclean MRN: [id number]   DOA: 05/04/2023  Facility requesting transfer: [facility]   Requesting Provider: Maxwell Marion, PA  Reason for transfer: GI bleeding, symptomatic anemia   Facility course: 27 year old female presenting with melena since 05/01/2023.  She is mildly tachycardic with stable blood pressure.  Fecal occult blood testing is positive and hemoglobin is 7.6 (11.1 on 05/01/2023).  CT of the abdomen pelvis is negative for acute findings.  Secure chat has been sent to GI with request for routine consultation by the ED PA and the patient was treated with 1 L of LR, IV Protonix, Zofran, and morphine in the ED.  Plan of care: The patient is accepted for admission to Telemetry unit, at [facility].   Author: Briscoe Deutscher, MD 05/04/2023  Check [email or web address] for on-call coverage.  Nursing staff, Please call [facility] Admits & Consults System-Wide number on Amion as soon as patient's arrival, so appropriate admitting provider can evaluate the pt.

## 2023-05-05 ENCOUNTER — Other Ambulatory Visit: Payer: Self-pay

## 2023-05-05 DIAGNOSIS — N760 Acute vaginitis: Secondary | ICD-10-CM | POA: Diagnosis present

## 2023-05-05 DIAGNOSIS — E66813 Obesity, class 3: Secondary | ICD-10-CM | POA: Diagnosis present

## 2023-05-05 DIAGNOSIS — K921 Melena: Secondary | ICD-10-CM | POA: Diagnosis present

## 2023-05-05 DIAGNOSIS — D62 Acute posthemorrhagic anemia: Secondary | ICD-10-CM | POA: Diagnosis present

## 2023-05-05 DIAGNOSIS — Z833 Family history of diabetes mellitus: Secondary | ICD-10-CM | POA: Diagnosis not present

## 2023-05-05 DIAGNOSIS — Z79899 Other long term (current) drug therapy: Secondary | ICD-10-CM | POA: Diagnosis not present

## 2023-05-05 DIAGNOSIS — I10 Essential (primary) hypertension: Secondary | ICD-10-CM | POA: Diagnosis present

## 2023-05-05 DIAGNOSIS — Z818 Family history of other mental and behavioral disorders: Secondary | ICD-10-CM | POA: Diagnosis not present

## 2023-05-05 DIAGNOSIS — D560 Alpha thalassemia: Secondary | ICD-10-CM | POA: Diagnosis present

## 2023-05-05 DIAGNOSIS — K388 Other specified diseases of appendix: Secondary | ICD-10-CM | POA: Diagnosis present

## 2023-05-05 DIAGNOSIS — D649 Anemia, unspecified: Secondary | ICD-10-CM | POA: Diagnosis not present

## 2023-05-05 DIAGNOSIS — Z87440 Personal history of urinary (tract) infections: Secondary | ICD-10-CM | POA: Diagnosis not present

## 2023-05-05 DIAGNOSIS — Z8249 Family history of ischemic heart disease and other diseases of the circulatory system: Secondary | ICD-10-CM | POA: Diagnosis not present

## 2023-05-05 DIAGNOSIS — R519 Headache, unspecified: Secondary | ICD-10-CM | POA: Diagnosis present

## 2023-05-05 DIAGNOSIS — H538 Other visual disturbances: Secondary | ICD-10-CM | POA: Diagnosis present

## 2023-05-05 DIAGNOSIS — Z6841 Body Mass Index (BMI) 40.0 and over, adult: Secondary | ICD-10-CM | POA: Diagnosis not present

## 2023-05-05 DIAGNOSIS — Z8719 Personal history of other diseases of the digestive system: Secondary | ICD-10-CM | POA: Diagnosis not present

## 2023-05-05 DIAGNOSIS — E876 Hypokalemia: Secondary | ICD-10-CM | POA: Diagnosis present

## 2023-05-05 DIAGNOSIS — R0789 Other chest pain: Secondary | ICD-10-CM | POA: Diagnosis present

## 2023-05-05 LAB — HEMOGLOBIN AND HEMATOCRIT, BLOOD
HCT: 21.4 % — ABNORMAL LOW (ref 36.0–46.0)
HCT: 23.6 % — ABNORMAL LOW (ref 36.0–46.0)
HCT: 22.3 % — ABNORMAL LOW (ref 36.0–46.0)
Hemoglobin: 6.6 g/dL — CL (ref 12.0–15.0)
Hemoglobin: 7.6 g/dL — ABNORMAL LOW (ref 12.0–15.0)
Hemoglobin: 7.1 g/dL — ABNORMAL LOW (ref 12.0–15.0)

## 2023-05-05 LAB — ABO/RH: ABO/RH(D): O POS

## 2023-05-05 LAB — PREPARE RBC (CROSSMATCH)

## 2023-05-05 MED ORDER — KCL IN DEXTROSE-NACL 20-5-0.9 MEQ/L-%-% IV SOLN
INTRAVENOUS | Status: AC
  Filled 2023-05-05 (×2): qty 1000

## 2023-05-05 MED ORDER — OXYCODONE HCL 5 MG PO TABS
5.0000 mg | ORAL_TABLET | ORAL | Status: DC | PRN
  Administered 2023-05-05 – 2023-05-10 (×9): 5 mg via ORAL
  Filled 2023-05-05 (×9): qty 1

## 2023-05-05 MED ORDER — FENTANYL CITRATE PF 50 MCG/ML IJ SOSY
25.0000 ug | PREFILLED_SYRINGE | Freq: Once | INTRAMUSCULAR | Status: AC
  Administered 2023-05-05: 25 ug via INTRAVENOUS
  Filled 2023-05-05: qty 1

## 2023-05-05 MED ORDER — ONDANSETRON HCL 4 MG PO TABS: 4.0000 mg | ORAL_TABLET | Freq: Four times a day (QID) | ORAL | Status: DC | PRN

## 2023-05-05 MED ORDER — MORPHINE SULFATE (PF) 2 MG/ML IV SOLN
2.0000 mg | INTRAVENOUS | Status: DC | PRN
  Administered 2023-05-06 – 2023-05-08 (×12): 2 mg via INTRAVENOUS
  Filled 2023-05-05 (×12): qty 1

## 2023-05-05 MED ORDER — POLYETHYLENE GLYCOL 3350 17 G PO PACK
17.0000 g | PACK | Freq: Every day | ORAL | Status: DC | PRN
  Administered 2023-05-06 – 2023-05-07 (×2): 17 g via ORAL
  Filled 2023-05-05 (×2): qty 1

## 2023-05-05 MED ORDER — SODIUM CHLORIDE 0.9% IV SOLUTION: Freq: Once | INTRAVENOUS | Status: DC

## 2023-05-05 MED ORDER — ONDANSETRON HCL 4 MG/2ML IJ SOLN
4.0000 mg | Freq: Four times a day (QID) | INTRAMUSCULAR | Status: DC | PRN
  Administered 2023-05-06 – 2023-05-07 (×3): 4 mg via INTRAVENOUS
  Filled 2023-05-05 (×3): qty 2

## 2023-05-05 MED ORDER — PANTOPRAZOLE SODIUM 40 MG IV SOLR
40.0000 mg | Freq: Two times a day (BID) | INTRAVENOUS | Status: DC
  Administered 2023-05-05 – 2023-05-09 (×8): 40 mg via INTRAVENOUS
  Filled 2023-05-05 (×9): qty 10

## 2023-05-05 MED ORDER — ACETAMINOPHEN 325 MG PO TABS
650.0000 mg | ORAL_TABLET | Freq: Four times a day (QID) | ORAL | Status: DC | PRN
  Administered 2023-05-05 – 2023-05-09 (×6): 650 mg via ORAL
  Filled 2023-05-05 (×6): qty 2

## 2023-05-05 MED ORDER — ACETAMINOPHEN 650 MG RE SUPP: 650.0000 mg | Freq: Four times a day (QID) | RECTAL | Status: DC | PRN

## 2023-05-05 MED ORDER — BISACODYL 10 MG RE SUPP: 10.0000 mg | Freq: Every day | RECTAL | Status: DC | PRN

## 2023-05-05 NOTE — Anesthesia Preprocedure Evaluation (Addendum)
Anesthesia Evaluation  Patient identified by MRN, date of birth, ID band Patient awake    Reviewed: Allergy & Precautions, NPO status , Patient's Chart, lab work & pertinent test results  History of Anesthesia Complications Negative for: history of anesthetic complications  Airway Mallampati: II  TM Distance: >3 FB Neck ROM: Full    Dental no notable dental hx.    Pulmonary neg pulmonary ROS   Pulmonary exam normal        Cardiovascular hypertension, Normal cardiovascular exam     Neuro/Psych negative neurological ROS     GI/Hepatic negative GI ROS, Neg liver ROS,,,GIB   Endo/Other    Morbid obesity  Renal/GU negative Renal ROS     Musculoskeletal negative musculoskeletal ROS (+)    Abdominal   Peds  Hematology  (+) Blood dyscrasia (Hgb 6.9, received 1u pRBCs), anemia   Anesthesia Other Findings Day of surgery medications reviewed with patient.  Reproductive/Obstetrics                             Anesthesia Physical Anesthesia Plan  ASA: 3  Anesthesia Plan: MAC   Post-op Pain Management: Minimal or no pain anticipated   Induction:   PONV Risk Score and Plan: 2 and Treatment may vary due to age or medical condition and Propofol infusion  Airway Management Planned: Natural Airway and Nasal Cannula  Additional Equipment: None  Intra-op Plan:   Post-operative Plan:   Informed Consent: I have reviewed the patients History and Physical, chart, labs and discussed the procedure including the risks, benefits and alternatives for the proposed anesthesia with the patient or authorized representative who has indicated his/her understanding and acceptance.       Plan Discussed with: CRNA  Anesthesia Plan Comments:        Anesthesia Quick Evaluation

## 2023-05-05 NOTE — Progress Notes (Signed)
Paged D. Crowsly about hemoglobin 6.6, no return page. Secure chat sent to Odie Sera MD. Asymptomatic at this time. B/p 91/50, stand by while up in room due to being high fall precautions.

## 2023-05-05 NOTE — H&P (View-Only) (Signed)
[facility] Gastroenterology Consult  Referring Provider: No ref. provider found Primary Care Physician:  Patient, No Pcp Per Primary Gastroenterologist: Unassigned  Reason for Consultation: Melena, anemia  SUBJECTIVE:   HPI: Nicole Mclean is a 27 y.o. female with past medical history significant for hypertension, alpha thalassemia, iron deficiency anemia.  Presented to hospital on 05/04/2023 with chief complaint of melena since 05/01/2023.  This is never occurred before.  She denied NSAID use.  She is currently breast-feeding.  She has been experiencing epigastric abdominal discomfort with radiation to her back.  She denied shortness of breath.  She noted having some midsternal chest discomfort, dull.  She noted "heavy" foods bother her abdominal pain.    She was seen by [facility] gastroenterology during inpatient stay in March 2024 for rectal bleeding while pregnant, underwent flexible sigmoidoscopy by Dr. Chales Abrahams on 10/11/2022 with findings of nonbleeding internal hemorrhoids, otherwise within normal limits to splenic flexure.  In presentation hemoglobin 7.6, down trended to 6.6, receiving blood during my evaluation.  WBC 8.8, platelet 309.  CT scan of abdomen pelvis completed on 05/01/2023 for abdominal pain showed mildly enlarged appendix.  CT scan of abdomen pelvis completed 05/04/2023 showed unchanged mildly enlarged appendix.  No known family history of colon cancer.  Past Medical History:  Diagnosis Date   Bleeding hemorrhoid 10/10/2022   Internal hemorrhoids   Chlamydia 01/2016   Gonorrhea    2015 and then 2024, both during pregnancy   Hypertension    Iron deficiency anemia 04/30/2015   Preeclampsia    Syphilis    UTI (urinary tract infection)    Past Surgical History:  Procedure Laterality Date   CESAREAN SECTION N/A 01/17/2023   Procedure: CESAREAN SECTION;  Surgeon: Tereso Newcomer, MD;  Location: [facility] LD ORS;  Service: Obstetrics;  Laterality: N/A;   Chin surgery     FLEXIBLE  SIGMOIDOSCOPY N/A 10/11/2022   Procedure: FLEXIBLE SIGMOIDOSCOPY;  Surgeon: Lynann Bologna, MD;  Location: [facility] ENDOSCOPY;  Service: Gastroenterology;  Laterality: N/A;   HAND SURGERY     Left  hand   Prior to Admission medications   Medication Sig Start Date End Date Taking? Authorizing Provider  metroNIDAZOLE (FLAGYL) 500 MG tablet Take 1 tablet (500 mg total) by mouth 2 (two) times daily. 03/30/23  Yes Warden Fillers, MD  Prenatal Vit-Fe Fumarate-FA (PRENATAL VITAMIN PLUS LOW IRON) 27-1 MG TABS Take 1 tablet by mouth daily. 07/25/22  Yes Constant, Peggy, MD   Current Facility-Administered Medications  Medication Dose Route Frequency Provider Last Rate Last Admin   0.9 %  sodium chloride infusion (Manually program via Guardrails IV Fluids)   Intravenous Once Gery Pray, MD       acetaminophen (TYLENOL) tablet 650 mg  650 mg Oral Q6H PRN Rolly Salter, MD       Or   acetaminophen (TYLENOL) suppository 650 mg  650 mg Rectal Q6H PRN Rolly Salter, MD       bisacodyl (DULCOLAX) suppository 10 mg  10 mg Rectal Daily PRN Rolly Salter, MD       dextrose 5 % and 0.9 % NaCl with KCl 20 mEq/L infusion   Intravenous Continuous Gery Pray, MD 75 mL/hr at 05/05/23 0315 New Bag at 05/05/23 0315   morphine (PF) 2 MG/ML injection 2 mg  2 mg Intravenous Q2H PRN Rolly Salter, MD       ondansetron [facility]) tablet 4 mg  4 mg Oral Q6H PRN Rolly Salter, MD  Or   ondansetron (ZOFRAN) injection 4 mg  4 mg Intravenous Q6H PRN Rolly Salter, MD       oxyCODONE (Oxy IR/ROXICODONE) immediate release tablet 5 mg  5 mg Oral Q4H PRN Rolly Salter, MD   5 mg at 05/05/23 0841   pantoprazole (PROTONIX) injection 40 mg  40 mg Intravenous Q12H Crosley, Debby, MD   40 mg at 05/05/23 0831   polyethylene glycol (MIRALAX / GLYCOLAX) packet 17 g  17 g Oral Daily PRN Rolly Salter, MD       Allergies as of 05/04/2023   (No Known Allergies)   Family History  Adopted: Yes  Problem Relation Age of  Onset   Lung disease Mother    Depression Mother    Heart disease Father    Diabetes Father    Hypertension Father    Learning disabilities Neg Hx    Mental illness Neg Hx    Mental retardation Neg Hx    Vision loss Neg Hx    Varicose Veins Neg Hx    Asthma Neg Hx    Social History   Socioeconomic History   Marital status: Single    Spouse name: Not on file   Number of children: Not on file   Years of education: Not on file   Highest education level: Not on file  Occupational History   Not on file  Tobacco Use   Smoking status: Never    Passive exposure: Never   Smokeless tobacco: Never  Vaping Use   Vaping status: Never Used  Substance and Sexual Activity   Alcohol use: No    Alcohol/week: 0.0 standard drinks of alcohol   Drug use: No   Sexual activity: Yes    Partners: Male    Birth control/protection: Patch  Other Topics Concern   Not on file  Social History Narrative   Not on file   Social Determinants of Health   Financial Resource Strain: Not on file  Food Insecurity: No Food Insecurity (05/05/2023)   Hunger Vital Sign    Worried About Running Out of Food in the Last Year: Never true    Ran Out of Food in the Last Year: Never true  Transportation Needs: No Transportation Needs (05/05/2023)   PRAPARE - Administrator, Civil Service (Medical): No    Lack of Transportation (Non-Medical): No  Physical Activity: Not on file  Stress: Not on file  Social Connections: Unknown (11/29/2021)   Received from [facility]   Social Network    Social Network: Not on file  Intimate Partner Violence: Not At Risk (05/05/2023)   Humiliation, Afraid, Rape, and Kick questionnaire    Fear of Current or Ex-Partner: No    Emotionally Abused: No    Physically Abused: No    Sexually Abused: No   Review of Systems:  Review of Systems  Respiratory:  Negative for shortness of breath.   Cardiovascular:  Positive for chest pain.  Gastrointestinal:   Positive for abdominal pain, blood in stool, melena and nausea. Negative for vomiting.    OBJECTIVE:   Temp:  [97.9 F (36.6 C)-98.1 F (36.7 C)] 98 F (36.7 C) (10/18 0859) Pulse Rate:  [59-107] 81 (10/18 0859) Resp:  [15-23] 18 (10/18 0859) BP: (91-144)/(49-79) 105/54 (10/18 0859) SpO2:  [99 %-100 %] 99 % (10/18 0859) Weight:  [621 kg-113.4 kg] 113.4 kg (10/18 0128) Last BM Date : 05/04/23 Physical Exam Constitutional:  General: She is not in acute distress.    Appearance: She is not ill-appearing, toxic-appearing or diaphoretic.  Cardiovascular:     Rate and Rhythm: Normal rate and regular rhythm.  Pulmonary:     Effort: No respiratory distress.     Breath sounds: Normal breath sounds.  Abdominal:     General: Bowel sounds are normal. There is no distension.     Palpations: Abdomen is soft.     Tenderness: There is no abdominal tenderness. There is no guarding.  Musculoskeletal:     Right lower leg: No edema.     Left lower leg: No edema.  Skin:    General: Skin is warm and dry.  Neurological:     Mental Status: She is alert.     Labs: Recent Labs    05/04/23 2011 05/05/23 0455  WBC 8.8  --   HGB 7.6* 6.6*  HCT 23.6* 21.4*  PLT 309  --    BMET Recent Labs    05/04/23 2011  NA 137  K 3.4*  CL 99  CO2 24  GLUCOSE 97  BUN 17  CREATININE 0.68  CALCIUM 8.8*   LFT Recent Labs    05/04/23 2011  PROT 7.1  ALBUMIN 3.9  AST 15  ALT 21  ALKPHOS 96  BILITOT 0.3   PT/INR No results for input(s): "LABPROT", "INR" in the last 72 hours.  Diagnostic imaging: CT ABDOMEN PELVIS W CONTRAST  Result Date: 05/04/2023 CLINICAL DATA:  Acute nonlocalized abdominal pain EXAM: CT ABDOMEN AND PELVIS WITH CONTRAST TECHNIQUE: Multidetector CT imaging of the abdomen and pelvis was performed using the standard protocol following bolus administration of intravenous contrast. RADIATION DOSE REDUCTION: This exam was performed according to the departmental  dose-optimization program which includes automated exposure control, adjustment of the mA and/or kV according to patient size and/or use of iterative reconstruction technique. CONTRAST:  OMNIPAQUE IOHEXOL 300 MG/ML  SOLN COMPARISON:  CT abdomen and pelvis 05/01/2023 FINDINGS: Lower chest: No acute abnormality. Hepatobiliary: Unremarkable liver. Normal gallbladder. No biliary dilation. Pancreas: Unremarkable. Spleen: Unremarkable. Adrenals/Urinary Tract: Normal adrenal glands. No urinary calculi or hydronephrosis. Bladder is unremarkable. Stomach/Bowel: Normal caliber large and small bowel. No bowel wall thickening. Stomach is within normal limits. The appendix again measures 9 mm in diameter. No hyperemia, periappendiceal stranding or free fluid. Vascular/Lymphatic: No significant vascular findings are present. No enlarged abdominal or pelvic lymph nodes. Reproductive: Unremarkable. Other: No free intraperitoneal fluid or air. Musculoskeletal: No acute fracture. IMPRESSION: No acute abnormality in the abdomen or pelvis. Unchanged mild enlargement of the appendix without inflammatory changes. Electronically Signed   By: Minerva Fester M.D.   On: 05/04/2023 21:59    IMPRESSION: Melena Symptomatic anemia History alpha thalassemia Personal history internal hemorrhoids  PLAN: -Recommend EGD to further evaluate melena and anemia, discussed procedure with patient including benefits, alternatives and risks of bleeding/infection/perforation/missed lesion/anesthesia, she verbalized understanding and elected to proceed -Continue IV PPI Q12Hr -Trend H/H, transfuse for Hgb < 7 -Okay for soft diet today, n.p.o. at midnight for EGD on 05/06/2023 with Dr. Marca Ancona   LOS: 0 days   Liliane Shi, DO [facility] Gastroenterology

## 2023-05-05 NOTE — Plan of Care (Signed)

## 2023-05-05 NOTE — H&P (Signed)
History and Physical  Patient: Nicole Mclean QIH:[id number] DOB: 05/28/1996 DOA: 05/04/2023 DOS: the patient was seen and examined on 05/05/2023 Patient coming from: Home  Chief Complaint: Abdominal pain and dark stool ED TRIAGE note : "Pt states black and bloody stools that started Monday  Over past 24 hours is having back pain and near syncope   " HPI: Nicole Mclean is a 27 y.o. female with PMH significant of HTN, preeclampsia, IDA, hemorrhoids present to the hospital with complaints of abdominal pain. Patient was actually seen in the ER on 10/14 with complaints of abdominal pain. At that time she did not have any significant diarrhea but did have some black BM. This continues to progress and further and patient reported 3-4 episodes of loose tarry black-colored bowel movement. Abdominal pain has been described as continuous dull ache in the upper part of the abdomen as well as around the umbilicus. She also reports nausea without any vomiting. No fever but reported chills. No alcohol abuse.  No drug abuse.  No smoking. Denies any ibuprofen Aleve naproxen use as well. No body water as well. She is currently breast-feeding. An episode of bleeding in the past although this was BRBPR rather than melanotic stool.  Review of Systems: As mentioned in the history of present illness. All other systems reviewed and are negative.  Prior to Admission medications   Medication Sig Start Date End Date Taking? Authorizing Provider  metroNIDAZOLE (FLAGYL) 500 MG tablet Take 1 tablet (500 mg total) by mouth 2 (two) times daily. 03/30/23  Yes Warden Fillers, MD  Prenatal Vit-Fe Fumarate-FA (PRENATAL VITAMIN PLUS LOW IRON) 27-1 MG TABS Take 1 tablet by mouth daily. 07/25/22  Yes Constant, Gigi Gin, MD    Past Medical History:  Diagnosis Date   Bleeding hemorrhoid 10/10/2022   Internal hemorrhoids   Chlamydia 01/2016   Gonorrhea    2015 and then 2024, both during pregnancy   Hypertension    Iron  deficiency anemia 04/30/2015   Preeclampsia    Syphilis    UTI (urinary tract infection)    Past Surgical History:  Procedure Laterality Date   CESAREAN SECTION N/A 01/17/2023   Procedure: CESAREAN SECTION;  Surgeon: Tereso Newcomer, MD;  Location: [facility] LD ORS;  Service: Obstetrics;  Laterality: N/A;   Chin surgery     FLEXIBLE SIGMOIDOSCOPY N/A 10/11/2022   Procedure: FLEXIBLE SIGMOIDOSCOPY;  Surgeon: Lynann Bologna, MD;  Location: [facility] ENDOSCOPY;  Service: Gastroenterology;  Laterality: N/A;   HAND SURGERY     Left  hand   Social History:  reports that she has never smoked. She has never been exposed to tobacco smoke. She has never used smokeless tobacco. She reports that she does not drink alcohol and does not use drugs. No Known Allergies Family History  Adopted: Yes  Problem Relation Age of Onset   Lung disease Mother    Depression Mother    Heart disease Father    Diabetes Father    Hypertension Father    Learning disabilities Neg Hx    Mental illness Neg Hx    Mental retardation Neg Hx    Vision loss Neg Hx    Varicose Veins Neg Hx    Asthma Neg Hx    Physical Exam: Vitals:   05/05/23 0815 05/05/23 0859 05/05/23 1126 05/05/23 1205  BP: (!) 99/51 (!) 105/54 (!) 104/57   Pulse: 90 81 77   Resp: 17 18 16    Temp: 97.9 F (36.6 C) 98 F (  36.7 C) 98.9 F (37.2 C) 98.9 F (37.2 C)  TempSrc: Oral Oral Oral Oral  SpO2: 99% 99% 100%   Weight:      Height:       General: Appear in mild distress; no visible Abnormal Neck Mass Or lumps, Conjunctiva normal Cardiovascular: S1 and S2 Present, no Murmur, Respiratory: good respiratory effort, Bilateral Air entry present and CTA, no Crackles, no wheezes Abdomen: Bowel Sound present, diffusely tender Extremities: no Pedal edema Neurology: alert and oriented to time, place, and person Gait not checked due to patient safety concerns   Data Reviewed: I have reviewed ED notes, Vitals, Lab results and outpatient records. Since last  encounter, pertinent lab results CBC and BMP   . I have ordered test including CBC and BMP  . I have discussed pt's care plan and test results with GI  .   Assessment and Plan Upper GI bleeding. GI consulted. Patient will remain n.p.o. after midnight. Soft diet for now. Continue PPI twice daily.  Symptomatic anemia. Hemoglobin dropped down below 7. Initial blood transfusion. Repeat H&H relatively stable. Monitor for now. Transfer for hemoglobin less than 7.  Edematous appendix. Will follow GI recommendation. Concern for Meckel's diverticulum. No evidence of appendicitis.  Will monitor  Currently breast-feeding. Recommend patient to avoid breast-feeding while she is on opioids.  Mild hypokalemia. Replaced.  History of preeclampsia. Was on blood pressure medication currently on hold. Will monitor.  Headache likely in the setting of anemia. No focal deficit. Patient does have prior history of headaches as well. For now we will monitor.  Advance Care Planning:   Code Status: Full Code  Consults: [facility] Family Communication: No one at bedside  Author: Lynden Oxford, MD 05/05/2023 6:20 PM For on call review [email or web address].

## 2023-05-05 NOTE — Plan of Care (Signed)
[facility] Tonsillectomy/Adenoidectomy, Postoperative PEDS care plan entered in error.

## 2023-05-05 NOTE — Consult Note (Signed)
[facility] Gastroenterology Consult  Referring Provider: No ref. provider found Primary Care Physician:  Patient, No Pcp Per Primary Gastroenterologist: Unassigned  Reason for Consultation: Melena, anemia  SUBJECTIVE:   HPI: Nicole Mclean is a 27 y.o. female with past medical history significant for hypertension, alpha thalassemia, iron deficiency anemia.  Presented to hospital on 05/04/2023 with chief complaint of melena since 05/01/2023.  This is never occurred before.  She denied NSAID use.  She is currently breast-feeding.  She has been experiencing epigastric abdominal discomfort with radiation to her back.  She denied shortness of breath.  She noted having some midsternal chest discomfort, dull.  She noted "heavy" foods bother her abdominal pain.    She was seen by [facility] gastroenterology during inpatient stay in March 2024 for rectal bleeding while pregnant, underwent flexible sigmoidoscopy by Dr. Chales Abrahams on 10/11/2022 with findings of nonbleeding internal hemorrhoids, otherwise within normal limits to splenic flexure.  In presentation hemoglobin 7.6, down trended to 6.6, receiving blood during my evaluation.  WBC 8.8, platelet 309.  CT scan of abdomen pelvis completed on 05/01/2023 for abdominal pain showed mildly enlarged appendix.  CT scan of abdomen pelvis completed 05/04/2023 showed unchanged mildly enlarged appendix.  No known family history of colon cancer.  Past Medical History:  Diagnosis Date   Bleeding hemorrhoid 10/10/2022   Internal hemorrhoids   Chlamydia 01/2016   Gonorrhea    2015 and then 2024, both during pregnancy   Hypertension    Iron deficiency anemia 04/30/2015   Preeclampsia    Syphilis    UTI (urinary tract infection)    Past Surgical History:  Procedure Laterality Date   CESAREAN SECTION N/A 01/17/2023   Procedure: CESAREAN SECTION;  Surgeon: Tereso Newcomer, MD;  Location: [facility] LD ORS;  Service: Obstetrics;  Laterality: N/A;   Chin surgery     FLEXIBLE  SIGMOIDOSCOPY N/A 10/11/2022   Procedure: FLEXIBLE SIGMOIDOSCOPY;  Surgeon: Lynann Bologna, MD;  Location: [facility] ENDOSCOPY;  Service: Gastroenterology;  Laterality: N/A;   HAND SURGERY     Left  hand   Prior to Admission medications   Medication Sig Start Date End Date Taking? Authorizing Provider  metroNIDAZOLE (FLAGYL) 500 MG tablet Take 1 tablet (500 mg total) by mouth 2 (two) times daily. 03/30/23  Yes Warden Fillers, MD  Prenatal Vit-Fe Fumarate-FA (PRENATAL VITAMIN PLUS LOW IRON) 27-1 MG TABS Take 1 tablet by mouth daily. 07/25/22  Yes Constant, Peggy, MD   Current Facility-Administered Medications  Medication Dose Route Frequency Provider Last Rate Last Admin   0.9 %  sodium chloride infusion (Manually program via Guardrails IV Fluids)   Intravenous Once Gery Pray, MD       acetaminophen (TYLENOL) tablet 650 mg  650 mg Oral Q6H PRN Rolly Salter, MD       Or   acetaminophen (TYLENOL) suppository 650 mg  650 mg Rectal Q6H PRN Rolly Salter, MD       bisacodyl (DULCOLAX) suppository 10 mg  10 mg Rectal Daily PRN Rolly Salter, MD       dextrose 5 % and 0.9 % NaCl with KCl 20 mEq/L infusion   Intravenous Continuous Gery Pray, MD 75 mL/hr at 05/05/23 0315 New Bag at 05/05/23 0315   morphine (PF) 2 MG/ML injection 2 mg  2 mg Intravenous Q2H PRN Rolly Salter, MD       ondansetron [facility]) tablet 4 mg  4 mg Oral Q6H PRN Rolly Salter, MD  Or   ondansetron (ZOFRAN) injection 4 mg  4 mg Intravenous Q6H PRN Rolly Salter, MD       oxyCODONE (Oxy IR/ROXICODONE) immediate release tablet 5 mg  5 mg Oral Q4H PRN Rolly Salter, MD   5 mg at 05/05/23 0841   pantoprazole (PROTONIX) injection 40 mg  40 mg Intravenous Q12H Crosley, Debby, MD   40 mg at 05/05/23 0831   polyethylene glycol (MIRALAX / GLYCOLAX) packet 17 g  17 g Oral Daily PRN Rolly Salter, MD       Allergies as of 05/04/2023   (No Known Allergies)   Family History  Adopted: Yes  Problem Relation Age of  Onset   Lung disease Mother    Depression Mother    Heart disease Father    Diabetes Father    Hypertension Father    Learning disabilities Neg Hx    Mental illness Neg Hx    Mental retardation Neg Hx    Vision loss Neg Hx    Varicose Veins Neg Hx    Asthma Neg Hx    Social History   Socioeconomic History   Marital status: Single    Spouse name: Not on file   Number of children: Not on file   Years of education: Not on file   Highest education level: Not on file  Occupational History   Not on file  Tobacco Use   Smoking status: Never    Passive exposure: Never   Smokeless tobacco: Never  Vaping Use   Vaping status: Never Used  Substance and Sexual Activity   Alcohol use: No    Alcohol/week: 0.0 standard drinks of alcohol   Drug use: No   Sexual activity: Yes    Partners: Male    Birth control/protection: Patch  Other Topics Concern   Not on file  Social History Narrative   Not on file   Social Determinants of Health   Financial Resource Strain: Not on file  Food Insecurity: No Food Insecurity (05/05/2023)   Hunger Vital Sign    Worried About Running Out of Food in the Last Year: Never true    Ran Out of Food in the Last Year: Never true  Transportation Needs: No Transportation Needs (05/05/2023)   PRAPARE - Administrator, Civil Service (Medical): No    Lack of Transportation (Non-Medical): No  Physical Activity: Not on file  Stress: Not on file  Social Connections: Unknown (11/29/2021)   Received from [facility]   Social Network    Social Network: Not on file  Intimate Partner Violence: Not At Risk (05/05/2023)   Humiliation, Afraid, Rape, and Kick questionnaire    Fear of Current or Ex-Partner: No    Emotionally Abused: No    Physically Abused: No    Sexually Abused: No   Review of Systems:  Review of Systems  Respiratory:  Negative for shortness of breath.   Cardiovascular:  Positive for chest pain.  Gastrointestinal:   Positive for abdominal pain, blood in stool, melena and nausea. Negative for vomiting.    OBJECTIVE:   Temp:  [97.9 F (36.6 C)-98.1 F (36.7 C)] 98 F (36.7 C) (10/18 0859) Pulse Rate:  [59-107] 81 (10/18 0859) Resp:  [15-23] 18 (10/18 0859) BP: (91-144)/(49-79) 105/54 (10/18 0859) SpO2:  [99 %-100 %] 99 % (10/18 0859) Weight:  [621 kg-113.4 kg] 113.4 kg (10/18 0128) Last BM Date : 05/04/23 Physical Exam Constitutional:  General: She is not in acute distress.    Appearance: She is not ill-appearing, toxic-appearing or diaphoretic.  Cardiovascular:     Rate and Rhythm: Normal rate and regular rhythm.  Pulmonary:     Effort: No respiratory distress.     Breath sounds: Normal breath sounds.  Abdominal:     General: Bowel sounds are normal. There is no distension.     Palpations: Abdomen is soft.     Tenderness: There is no abdominal tenderness. There is no guarding.  Musculoskeletal:     Right lower leg: No edema.     Left lower leg: No edema.  Skin:    General: Skin is warm and dry.  Neurological:     Mental Status: She is alert.     Labs: Recent Labs    05/04/23 2011 05/05/23 0455  WBC 8.8  --   HGB 7.6* 6.6*  HCT 23.6* 21.4*  PLT 309  --    BMET Recent Labs    05/04/23 2011  NA 137  K 3.4*  CL 99  CO2 24  GLUCOSE 97  BUN 17  CREATININE 0.68  CALCIUM 8.8*   LFT Recent Labs    05/04/23 2011  PROT 7.1  ALBUMIN 3.9  AST 15  ALT 21  ALKPHOS 96  BILITOT 0.3   PT/INR No results for input(s): "LABPROT", "INR" in the last 72 hours.  Diagnostic imaging: CT ABDOMEN PELVIS W CONTRAST  Result Date: 05/04/2023 CLINICAL DATA:  Acute nonlocalized abdominal pain EXAM: CT ABDOMEN AND PELVIS WITH CONTRAST TECHNIQUE: Multidetector CT imaging of the abdomen and pelvis was performed using the standard protocol following bolus administration of intravenous contrast. RADIATION DOSE REDUCTION: This exam was performed according to the departmental  dose-optimization program which includes automated exposure control, adjustment of the mA and/or kV according to patient size and/or use of iterative reconstruction technique. CONTRAST:  OMNIPAQUE IOHEXOL 300 MG/ML  SOLN COMPARISON:  CT abdomen and pelvis 05/01/2023 FINDINGS: Lower chest: No acute abnormality. Hepatobiliary: Unremarkable liver. Normal gallbladder. No biliary dilation. Pancreas: Unremarkable. Spleen: Unremarkable. Adrenals/Urinary Tract: Normal adrenal glands. No urinary calculi or hydronephrosis. Bladder is unremarkable. Stomach/Bowel: Normal caliber large and small bowel. No bowel wall thickening. Stomach is within normal limits. The appendix again measures 9 mm in diameter. No hyperemia, periappendiceal stranding or free fluid. Vascular/Lymphatic: No significant vascular findings are present. No enlarged abdominal or pelvic lymph nodes. Reproductive: Unremarkable. Other: No free intraperitoneal fluid or air. Musculoskeletal: No acute fracture. IMPRESSION: No acute abnormality in the abdomen or pelvis. Unchanged mild enlargement of the appendix without inflammatory changes. Electronically Signed   By: Minerva Fester M.D.   On: 05/04/2023 21:59    IMPRESSION: Melena Symptomatic anemia History alpha thalassemia Personal history internal hemorrhoids  PLAN: -Recommend EGD to further evaluate melena and anemia, discussed procedure with patient including benefits, alternatives and risks of bleeding/infection/perforation/missed lesion/anesthesia, she verbalized understanding and elected to proceed -Continue IV PPI Q12Hr -Trend H/H, transfuse for Hgb < 7 -Okay for soft diet today, n.p.o. at midnight for EGD on 05/06/2023 with Dr. Marca Ancona   LOS: 0 days   Liliane Shi, DO [facility] Gastroenterology

## 2023-05-06 ENCOUNTER — Inpatient Hospital Stay (HOSPITAL_COMMUNITY): Payer: Self-pay | Admitting: Anesthesiology

## 2023-05-06 ENCOUNTER — Encounter (HOSPITAL_COMMUNITY)
Admission: EM | Disposition: A | Discharge status: Other Acute Inpatient Hospital | Payer: Self-pay | Source: Home / Self Care | Attending: Internal Medicine

## 2023-05-06 ENCOUNTER — Encounter (HOSPITAL_COMMUNITY): Payer: Self-pay | Admitting: Family Medicine

## 2023-05-06 DIAGNOSIS — D62 Acute posthemorrhagic anemia: Secondary | ICD-10-CM

## 2023-05-06 DIAGNOSIS — K921 Melena: Secondary | ICD-10-CM | POA: Diagnosis not present

## 2023-05-06 HISTORY — PX: GIVENS CAPSULE STUDY: SHX5432

## 2023-05-06 HISTORY — PX: ESOPHAGOGASTRODUODENOSCOPY: SHX5428

## 2023-05-06 LAB — COMPREHENSIVE METABOLIC PANEL
ALT: 17 U/L (ref 0–44)
AST: 13 U/L — ABNORMAL LOW (ref 15–41)
Albumin: 3.2 g/dL — ABNORMAL LOW (ref 3.5–5.0)
Alkaline Phosphatase: 77 U/L (ref 38–126)
Anion gap: 7 (ref 5–15)
BUN: 13 mg/dL (ref 6–20)
CO2: 27 mmol/L (ref 22–32)
Calcium: 9 mg/dL (ref 8.9–10.3)
Chloride: 107 mmol/L (ref 98–111)
Creatinine, Ser: 0.56 mg/dL (ref 0.44–1.00)
GFR, Estimated: >60 mL/min (ref >60)
Glucose, Bld: 92 mg/dL (ref 70–99)
Potassium: 3.8 mmol/L (ref 3.5–5.1)
Sodium: 141 mmol/L (ref 135–145)
Total Bilirubin: 0.5 mg/dL (ref 0.3–1.2)
Total Protein: 6.1 g/dL — ABNORMAL LOW (ref 6.5–8.1)

## 2023-05-06 LAB — CBC
HCT: 23.7 % — ABNORMAL LOW (ref 36.0–46.0)
Hemoglobin: 7.6 g/dL — ABNORMAL LOW (ref 12.0–15.0)
MCH: 28.7 pg (ref 26.0–34.0)
MCHC: 32.1 g/dL (ref 30.0–36.0)
MCV: 89.4 fL (ref 80.0–100.0)
Platelets: 243 10*3/uL (ref 150–400)
RBC: 2.65 MIL/uL — ABNORMAL LOW (ref 3.87–5.11)
RDW: 14.2 % (ref 11.5–15.5)
WBC: 6.6 10*3/uL (ref 4.0–10.5)
nRBC: 0 % (ref 0.0–0.2)

## 2023-05-06 LAB — HEMOGLOBIN AND HEMATOCRIT, BLOOD
HCT: 21.9 % — ABNORMAL LOW (ref 36.0–46.0)
HCT: 25.4 % — ABNORMAL LOW (ref 36.0–46.0)
HCT: 23.4 % — ABNORMAL LOW (ref 36.0–46.0)
Hemoglobin: 6.9 g/dL — CL (ref 12.0–15.0)
Hemoglobin: 8.1 g/dL — ABNORMAL LOW (ref 12.0–15.0)
Hemoglobin: 7.4 g/dL — ABNORMAL LOW (ref 12.0–15.0)

## 2023-05-06 LAB — PREPARE RBC (CROSSMATCH)

## 2023-05-06 SURGERY — EGD (ESOPHAGOGASTRODUODENOSCOPY)
Anesthesia: Monitor Anesthesia Care

## 2023-05-06 MED ORDER — DEXMEDETOMIDINE HCL IN NACL 80 MCG/20ML IV SOLN
INTRAVENOUS | Status: DC | PRN
Start: 2023-05-06 — End: 2023-05-06
  Administered 2023-05-06: 6 ug via INTRAVENOUS

## 2023-05-06 MED ORDER — SODIUM CHLORIDE 0.9% IV SOLUTION: Freq: Once | INTRAVENOUS | Status: DC

## 2023-05-06 MED ORDER — PROPOFOL 1000 MG/100ML IV EMUL
INTRAVENOUS | Status: AC
  Filled 2023-05-06: qty 100

## 2023-05-06 MED ORDER — PROPOFOL 10 MG/ML IV BOLUS
INTRAVENOUS | Status: AC
  Filled 2023-05-06: qty 20

## 2023-05-06 MED ORDER — PROPOFOL 10 MG/ML IV BOLUS
INTRAVENOUS | Status: DC | PRN
  Administered 2023-05-06: 40 mg via INTRAVENOUS
  Administered 2023-05-06: 80 mg via INTRAVENOUS
  Administered 2023-05-06: 20 mg via INTRAVENOUS

## 2023-05-06 MED ORDER — DEXMEDETOMIDINE HCL IN NACL 80 MCG/20ML IV SOLN
INTRAVENOUS | Status: AC
  Filled 2023-05-06: qty 20

## 2023-05-06 MED ORDER — SODIUM CHLORIDE 0.9 % IV SOLN: INTRAVENOUS | Status: DC | PRN

## 2023-05-06 MED ORDER — PROPOFOL 500 MG/50ML IV EMUL
INTRAVENOUS | Status: DC | PRN
  Administered 2023-05-06: 150 ug/kg/min via INTRAVENOUS

## 2023-05-06 MED ORDER — METRONIDAZOLE 500 MG PO TABS
500.0000 mg | ORAL_TABLET | Freq: Two times a day (BID) | ORAL | Status: DC
  Administered 2023-05-06 – 2023-05-10 (×9): 500 mg via ORAL
  Filled 2023-05-06 (×9): qty 1

## 2023-05-06 MED ORDER — LIDOCAINE HCL (CARDIAC) PF 100 MG/5ML IV SOSY
PREFILLED_SYRINGE | INTRAVENOUS | Status: DC | PRN
  Administered 2023-05-06: 100 mg via INTRAVENOUS

## 2023-05-06 NOTE — Transfer of Care (Addendum)
Immediate Anesthesia Transfer of Care Note  Patient: Nicole Mclean  Procedure(s) Performed: ESOPHAGOGASTRODUODENOSCOPY (EGD) GIVENS CAPSULE STUDY  Patient Location: Endoscopy Unit  Anesthesia Type:MAC  Level of Consciousness: drowsy and patient cooperative  Airway & Oxygen Therapy: Patient Spontanous Breathing  Post-op Assessment: Report given to RN and Post -op Vital signs reviewed and stable  Post vital signs: Reviewed and stable  Last Vitals:  Vitals Value Taken Time  BP 105/53 05/06/23   0844  Temp 36.7 C 05/06/23   0837  Pulse 91 05/06/23   0844  Resp 26 05/06/23   0844  SpO2 98% 05/06/23   0844    Last Pain:  Vitals:   05/06/23 0805  TempSrc: Temporal  PainSc:       Patients Stated Pain Goal: 3 (05/05/23 1105)  Complications: No notable events documented.

## 2023-05-06 NOTE — Interval H&P Note (Signed)
History and Physical Interval Note: 27/female with anemia and melena, IDA, epigastric pain for an EGD with propofol.  05/06/2023 7:54 AM  Nicole Mclean  has presented today for EGD, with the diagnosis of Melena, anemia.  The various methods of treatment have been discussed with the patient and family. After consideration of risks, benefits and other options for treatment, the patient has consented to  Procedure(s): ESOPHAGOGASTRODUODENOSCOPY (EGD) (N/A) as a surgical intervention.  The patient's history has been reviewed, patient examined, no change in status, stable for surgery.  I have reviewed the patient's chart and labs.  Questions were answered to the patient's satisfaction.     Kerin Salen

## 2023-05-06 NOTE — Plan of Care (Signed)

## 2023-05-06 NOTE — Progress Notes (Addendum)
    Patient Name: SWEDEN HEINSOHN           DOB: 07/18/1996  MRN: [id number]      Admission Date: 05/04/2023  Attending Provider: Rolly Salter, MD  Primary Diagnosis: GI bleeding   Level of care: Telemetry    CROSS COVER NOTE   Date of Service   05/06/2023   FREYJA DOOSE, 27 y.o. female, was admitted on 05/04/2023 for GI bleeding.    HPI/Events of Note   Acute Blood Loss Anemia Upper GI bleed Hemoglobin 7.1 -->  6.9.  No acute changes reported.  Hemodynamically stable. No melena, hematochezia, or other bleeding reported tonight.    Interventions/ Plan   Blood transfusion, 1 unit PRBC        Anthoney Harada, DNP, [employer] [facility] [place]

## 2023-05-06 NOTE — Addendum Note (Signed)
Addendum  created 05/06/23 1512 by Oletha Cruel, CRNA   Clinical Note Signed

## 2023-05-06 NOTE — Hospital Course (Addendum)
Brief hospital course: Nicole Mclean is a 27 y.o. female with PMH significant of HTN, preeclampsia, IDA, hemorrhoids present to the hospital with complaints of abdominal pain.  Underwent EGD which did not show any active bleeding. Now has capsule endoscopy. Assessment and Plan: Upper GI bleeding. GI consulted. Underwent EGD. No activity. Now undergoing capsule endoscopy. Monitor. Continue PPI twice daily.   Symptomatic anemia. Requiring 2 PRBC so far.  H&H stable.  Monitor. Transfer for hemoglobin less than 7.   Edematous appendix. Will follow GI recommendation. Concern for Meckel's diverticulum. No evidence of appendicitis.  Will monitor   Currently breast-feeding. Recommend patient to avoid breast-feeding while she is on opioids.   Mild hypokalemia. Replaced.   History of preeclampsia. Was on blood pressure medication currently on hold. Will monitor.   Headache likely in the setting of anemia. No focal deficit. Patient does have prior history of headaches as well. For now we will monitor.  Bacterial vaginosis. Resuming home Flagyl.   Obesity Class 3 Body mass index is 47.26 kg/m.  Placing the pt at higher risk of poor outcomes.

## 2023-05-06 NOTE — Op Note (Signed)
[facility] Patient Name: Nicole Mclean Procedure Date: 05/06/2023 MRN: [id number] Attending MD: Kerin Salen , MD, [id number] Date of Birth: Mar 10, 1996 CSN: [id number] Age: 27 Admit Type: Inpatient Procedure:                Upper GI endoscopy Indications:              Acute post hemorrhagic anemia, Melena Providers:                Kerin Salen, MD, Lorenza Evangelist, RN, Priscella Mann,                            Technician Referring MD:             [facility] Medicines:                Monitored Anesthesia Care Complications:            No immediate complications. Estimated Blood Loss:     Estimated blood loss: none. Procedure:                Pre-Anesthesia Assessment:                           - Prior to the procedure, a History and Physical                            was performed, and patient medications and                            allergies were reviewed. The patient's tolerance of                            previous anesthesia was also reviewed. The risks                            and benefits of the procedure and the sedation                            options and risks were discussed with the patient.                            All questions were answered, and informed consent                            was obtained. Prior Anticoagulants: The patient has                            taken no anticoagulant or antiplatelet agents. ASA                            Grade Assessment: III - A patient with severe                            systemic disease. After reviewing the risks and  benefits, the patient was deemed in satisfactory                            condition to undergo the procedure.                           After obtaining informed consent, the endoscope was                            passed under direct vision. Throughout the                            procedure, the patient's blood pressure, pulse, and                             oxygen saturations were monitored continuously. The                            [id number]-[id number] ([id number]) Olympus endoscope was introduced                            through the mouth, and advanced to the second part                            of duodenum. The upper GI endoscopy was                            accomplished without difficulty. The patient                            tolerated the procedure well. Scope In: Scope Out: Findings:      The examined esophagus was normal.      The Z-line was regular and was found 35 cm from the incisors.      The entire examined stomach was normal.      The cardia and gastric fundus were normal on retroflexion.      The examined duodenum was normal. Using the endoscope, the video capsule       enteroscope was advanced into the first portion of the duodenum. Impression:               - Normal esophagus.                           - Z-line regular, 35 cm from the incisors.                           - Normal stomach.                           - Normal examined duodenum.                           - Successful completion of the Video Capsule                            Enteroscope  placement.                           - No specimens collected. Moderate Sedation:      Patient did not receive moderate sedation for this procedure, but       instead received monitored anesthesia care. Recommendation:           - As per small bowel pillcam instructions. Procedure Code(s):        --- Professional ---                           [phone], Esophagogastroduodenoscopy, flexible,                            transoral; diagnostic, including collection of                            specimen(s) by brushing or washing, when performed                            (separate procedure) Diagnosis Code(s):        --- Professional ---                           D62, Acute posthemorrhagic anemia                           K92.1, Melena (includes Hematochezia) CPT copyright 2022 American  Medical Association. All rights reserved. The codes documented in this report are preliminary and upon coder review may  be revised to meet current compliance requirements. Kerin Salen, MD 05/06/2023 8:38:23 AM This report has been signed electronically. Number of Addenda: 0

## 2023-05-06 NOTE — Progress Notes (Addendum)
[facility] Progress Note Patient: Nicole Mclean OZH:[id number] DOB: March 05, 1996 DOA: 05/04/2023  DOS: the patient was seen and examined on 05/06/2023  Brief hospital course: Nicole Mclean is a 27 y.o. female with PMH significant of HTN, preeclampsia, IDA, hemorrhoids present to the hospital with complaints of abdominal pain.  Underwent EGD which did not show any active bleeding. Now has capsule endoscopy. Assessment and Plan: Upper GI bleeding. GI consulted. Underwent EGD. No activity. Now undergoing capsule endoscopy. Monitor. Continue PPI twice daily.   Symptomatic anemia. Requiring 2 PRBC so far.  H&H stable.  Monitor. Transfer for hemoglobin less than 7.   Edematous appendix. Will follow GI recommendation. Concern for Meckel's diverticulum. No evidence of appendicitis.  Will monitor   Currently breast-feeding. Recommend patient to avoid breast-feeding while she is on opioids.   Mild hypokalemia. Replaced.   History of preeclampsia. Was on blood pressure medication currently on hold. Will monitor.   Headache likely in the setting of anemia. No focal deficit. Patient does have prior history of headaches as well. For now we will monitor.  Bacterial vaginosis. Resuming home Flagyl.   Obesity Class 3 Body mass index is 47.26 kg/m.  Placing the pt at higher risk of poor outcomes.    Subjective: No nausea no vomiting.  Ongoing abdominal pain.  No fever no chills.  Reports not passing gas and no BM so far today.  Physical Exam: General: in Mild distress, No Rash Cardiovascular: S1 and S2 Present, No Murmur Respiratory: Good respiratory effort, Bilateral Air entry present. No Crackles, No wheezes Abdomen: Bowel Sound present, diffuse periumbilical tenderness Extremities: No edema Neuro: Alert and oriented x3, no new focal deficit  Data Reviewed: I have Reviewed nursing notes, Vitals, and Lab results. Since last encounter, pertinent lab results CBC and BMP    . I have ordered test including CBC and BMP  . I have discussed pt's care plan and test results with GI  .   Disposition: Status is: Inpatient Remains inpatient appropriate because: Awaiting stability of hemoglobin and further workup  SCDs Start: 05/05/23 0831   Family Communication: No one at bedside Level of care: Telemetry due to anemia Vitals:   05/06/23 0850 05/06/23 0900 05/06/23 0910 05/06/23 1011  BP: (!) 119/56 (!) 121/57 (!) 108/55 (!) 108/56  Pulse: 84 80 76 73  Resp: (!) 24 (!) 22 15 14   Temp:    98.4 F (36.9 C)  TempSrc:    Oral  SpO2: 99% 98% 97% 98%  Weight:      Height:         Author: Lynden Oxford, MD 05/06/2023 6:42 PM  Please look on [email or web address] to find out who is on call.

## 2023-05-06 NOTE — Anesthesia Postprocedure Evaluation (Signed)
Anesthesia Post Note  Patient: Nicole Mclean  Procedure(s) Performed: ESOPHAGOGASTRODUODENOSCOPY (EGD) GIVENS CAPSULE STUDY     Patient location during evaluation: PACU Anesthesia Type: MAC Level of consciousness: awake and alert Pain management: pain level controlled Vital Signs Assessment: post-procedure vital signs reviewed and stable Respiratory status: spontaneous breathing, nonlabored ventilation and respiratory function stable Cardiovascular status: blood pressure returned to baseline Postop Assessment: no apparent nausea or vomiting Anesthetic complications: no   No notable events documented.  Last Vitals:  Vitals:   05/06/23 0837 05/06/23 0844  BP: (!) 89/54 (!) 105/53  Pulse: 90 88  Resp: 20 (!) 23  Temp: 36.7 C   SpO2: 98% 99%    Last Pain:  Vitals:   05/06/23 0844  TempSrc:   PainSc: 0-No pain                 Shanda Howells

## 2023-05-07 DIAGNOSIS — K921 Melena: Secondary | ICD-10-CM | POA: Diagnosis not present

## 2023-05-07 LAB — CBC
HCT: 23 % — ABNORMAL LOW (ref 36.0–46.0)
HCT: 23.4 % — ABNORMAL LOW (ref 36.0–46.0)
Hemoglobin: 7.4 g/dL — ABNORMAL LOW (ref 12.0–15.0)
Hemoglobin: 7.5 g/dL — ABNORMAL LOW (ref 12.0–15.0)
MCH: 29 pg (ref 26.0–34.0)
MCH: 28.8 pg (ref 26.0–34.0)
MCHC: 32.2 g/dL (ref 30.0–36.0)
MCHC: 32.1 g/dL (ref 30.0–36.0)
MCV: 90.2 fL (ref 80.0–100.0)
MCV: 90 fL (ref 80.0–100.0)
Platelets: 254 10*3/uL (ref 150–400)
Platelets: 287 10*3/uL (ref 150–400)
RBC: 2.55 MIL/uL — ABNORMAL LOW (ref 3.87–5.11)
RBC: 2.6 MIL/uL — ABNORMAL LOW (ref 3.87–5.11)
RDW: 14.2 % (ref 11.5–15.5)
RDW: 14.3 % (ref 11.5–15.5)
WBC: 8.3 10*3/uL (ref 4.0–10.5)
WBC: 6.6 10*3/uL (ref 4.0–10.5)
nRBC: 0 % (ref 0.0–0.2)
nRBC: 0 % (ref 0.0–0.2)

## 2023-05-07 LAB — BASIC METABOLIC PANEL
Anion gap: 6 (ref 5–15)
BUN: 14 mg/dL (ref 6–20)
CO2: 28 mmol/L (ref 22–32)
Calcium: 8.9 mg/dL (ref 8.9–10.3)
Chloride: 104 mmol/L (ref 98–111)
Creatinine, Ser: 0.63 mg/dL (ref 0.44–1.00)
GFR, Estimated: >60 mL/min (ref >60)
Glucose, Bld: 99 mg/dL (ref 70–99)
Potassium: 3.9 mmol/L (ref 3.5–5.1)
Sodium: 138 mmol/L (ref 135–145)

## 2023-05-07 LAB — MAGNESIUM: Magnesium: 2 mg/dL (ref 1.7–2.4)

## 2023-05-07 LAB — HEMOGLOBIN AND HEMATOCRIT, BLOOD
HCT: 22.7 % — ABNORMAL LOW (ref 36.0–46.0)
Hemoglobin: 7.3 g/dL — ABNORMAL LOW (ref 12.0–15.0)

## 2023-05-07 MED ORDER — BUTALBITAL-APAP-CAFFEINE 50-325-40 MG PO TABS: 1.0000 | ORAL_TABLET | Freq: Four times a day (QID) | ORAL | Status: DC | PRN

## 2023-05-07 NOTE — Progress Notes (Signed)
[facility] Progress Note Patient: Nicole Mclean:[id number] DOB: Dec 25, 1995 DOA: 05/04/2023  DOS: the patient was seen and examined on 05/07/2023  Brief hospital course: Yazmyn E Kosman is a 27 y.o. female with PMH significant of HTN, preeclampsia, IDA, hemorrhoids present to the hospital with complaints of abdominal pain.  Underwent EGD which did not show any active bleeding. Now has capsule endoscopy.  Assessment and Plan: Upper GI bleeding. GI consulted. Underwent EGD.  No evidence of active bleeding seen so far. Underwent capsule endoscopy.  Shows evidence of active bleeding in the distal ileal area. Double-balloon push enteroscopy was recommended. I reached out to [facility] transfer center on 10/20. "Received a call back from the [facility] transfer center regarding the transfer request, Medical Director at [facility] Dr. Melinda Crutch suggested that they are at capacity and currently will have to decline your request at this time Based on their recommendation, we will call back tomorrow and see what they say.  In the mean time we will perform a meckel's scan here and support her Hb.  Continue PPI twice daily.   Symptomatic anemia. Requiring 2 PRBC so far.  H&H stable.  Monitor. Transfer for hemoglobin less than 7.   Edematous appendix. Will follow GI recommendation. Concern for Meckel's diverticulum. No evidence of appendicitis.  Will monitor   Currently breast-feeding. Recommend patient to avoid breast-feeding while she is on opioids and butalbital   Mild hypokalemia. Replaced.   History of preeclampsia. Was on blood pressure medication currently on hold. Will monitor.   Headache likely in the setting of anemia. No focal deficit. Patient does have prior history of headaches as well. For now we will monitor.  Bacterial vaginosis. Resuming home Flagyl.   Obesity Class 3 Body mass index is 47.26 kg/m.  Placing the pt at higher risk of poor outcomes.    Subjective: No  nausea no vomiting.  Continues to have some headache.  Had a BM with black-colored stool again.  Abdominal pain unchanged.  Physical Exam: General: in Mild distress, No Rash Cardiovascular: S1 and S2 Present, No Murmur Respiratory: Good respiratory effort, Bilateral Air entry present. No Crackles, No wheezes Abdomen: Bowel Sound present, diffuse abdominal tenderness Extremities: No edema Neuro: Alert and oriented x3, no new focal deficit  Data Reviewed: I have Reviewed nursing notes, Vitals, and Lab results. Since last encounter, pertinent lab results CBC and CMP   . I have ordered test including CBC and CMP  . I have discussed pt's care plan and test results with GI and [facility] transfer center  .  Disposition: Status is: Inpatient Remains inpatient appropriate because: Need to support H&H and will require transfer to [facility]  SCDs Start: 05/05/23 0831   Family Communication: Family at bedside Level of care: Telemetry   Vitals:   05/06/23 1011 05/06/23 2242 05/07/23 0522 05/07/23 1306  BP: (!) 108/56 125/61 117/64 101/62  Pulse: 73 79 79 87  Resp: 14 20 19 15   Temp: 98.4 F (36.9 C) 97.6 F (36.4 C) 97.6 F (36.4 C) 98.8 F (37.1 C)  TempSrc: Oral   Oral  SpO2: 98% 99% 98% 99%  Weight:      Height:         Author: Lynden Oxford, MD 05/07/2023 4:47 PM  Please look on [email or web address] to find out who is on call.

## 2023-05-07 NOTE — Procedures (Signed)
Indication for small bowel pillcam: 27 year old female with melena and anemia. EGD 05/06/2023: Unremarkable, PillCam deployed in duodenum Flexible sigmoidoscopy 10/11/2022, hematochezia: Internal hemorrhoids  Procedure information and findings: After obtaining informed consent, PillCam was deployed in the duodenal bulb. There was evidence of blood, bright red and clot noted from 3 hours and 34 minutes into the study. Fresh blood was noted in the small bowel up to the ileocecal valve and there after dark blood was noted in the colon.  Summary and recommendations: Active bleeding/recent bleeding noted in small bowel. Double-balloon enteroscopy recommended for further evaluation. Patient will likely need to be transferred to [facility] for the procedure. Monitor H&H and transfuse as needed.

## 2023-05-07 NOTE — Plan of Care (Signed)
  Problem: Activity: Goal: Risk for activity intolerance will decrease Outcome: Progressing   Problem: Nutrition: Goal: Adequate nutrition will be maintained Outcome: Progressing   Problem: Pain Managment: Goal: General experience of comfort will improve Outcome: Progressing   Problem: Safety: Goal: Ability to remain free from injury will improve Outcome: Progressing   Problem: Skin Integrity: Goal: Risk for impaired skin integrity will decrease Outcome: Progressing   

## 2023-05-07 NOTE — Plan of Care (Signed)

## 2023-05-08 ENCOUNTER — Encounter (HOSPITAL_COMMUNITY): Payer: Self-pay | Admitting: Gastroenterology

## 2023-05-08 ENCOUNTER — Inpatient Hospital Stay (HOSPITAL_COMMUNITY): Payer: No Typology Code available for payment source

## 2023-05-08 DIAGNOSIS — K921 Melena: Secondary | ICD-10-CM | POA: Diagnosis not present

## 2023-05-08 LAB — CBC
HCT: 22.7 % — ABNORMAL LOW (ref 36.0–46.0)
HCT: 23.2 % — ABNORMAL LOW (ref 36.0–46.0)
HCT: 22.8 % — ABNORMAL LOW (ref 36.0–46.0)
Hemoglobin: 7 g/dL — ABNORMAL LOW (ref 12.0–15.0)
Hemoglobin: 7.3 g/dL — ABNORMAL LOW (ref 12.0–15.0)
Hemoglobin: 7.2 g/dL — ABNORMAL LOW (ref 12.0–15.0)
MCH: 27.8 pg (ref 26.0–34.0)
MCH: 28.3 pg (ref 26.0–34.0)
MCH: 28.5 pg (ref 26.0–34.0)
MCHC: 30.8 g/dL (ref 30.0–36.0)
MCHC: 31.5 g/dL (ref 30.0–36.0)
MCHC: 31.6 g/dL (ref 30.0–36.0)
MCV: 90.1 fL (ref 80.0–100.0)
MCV: 89.9 fL (ref 80.0–100.0)
MCV: 90.1 fL (ref 80.0–100.0)
Platelets: 267 10*3/uL (ref 150–400)
Platelets: 277 10*3/uL (ref 150–400)
Platelets: 301 10*3/uL (ref 150–400)
RBC: 2.52 MIL/uL — ABNORMAL LOW (ref 3.87–5.11)
RBC: 2.58 MIL/uL — ABNORMAL LOW (ref 3.87–5.11)
RBC: 2.53 MIL/uL — ABNORMAL LOW (ref 3.87–5.11)
RDW: 14.5 % (ref 11.5–15.5)
RDW: 14.3 % (ref 11.5–15.5)
RDW: 14.4 % (ref 11.5–15.5)
WBC: 6.9 10*3/uL (ref 4.0–10.5)
WBC: 6.8 10*3/uL (ref 4.0–10.5)
WBC: 7.1 10*3/uL (ref 4.0–10.5)
nRBC: 0 % (ref 0.0–0.2)
nRBC: 0 % (ref 0.0–0.2)
nRBC: 0 % (ref 0.0–0.2)

## 2023-05-08 LAB — BASIC METABOLIC PANEL
Anion gap: 7 (ref 5–15)
BUN: 15 mg/dL (ref 6–20)
CO2: 27 mmol/L (ref 22–32)
Calcium: 8.8 mg/dL — ABNORMAL LOW (ref 8.9–10.3)
Chloride: 101 mmol/L (ref 98–111)
Creatinine, Ser: 0.82 mg/dL (ref 0.44–1.00)
GFR, Estimated: >60 mL/min (ref >60)
Glucose, Bld: 112 mg/dL — ABNORMAL HIGH (ref 70–99)
Potassium: 3.8 mmol/L (ref 3.5–5.1)
Sodium: 135 mmol/L (ref 135–145)

## 2023-05-08 LAB — MAGNESIUM: Magnesium: 1.9 mg/dL (ref 1.7–2.4)

## 2023-05-08 MED ORDER — HYOSCYAMINE SULFATE 0.125 MG SL SUBL
0.1250 mg | SUBLINGUAL_TABLET | Freq: Three times a day (TID) | SUBLINGUAL | Status: DC
Start: 2023-05-08 — End: 2023-05-11
  Administered 2023-05-08 – 2023-05-10 (×7): 0.125 mg via SUBLINGUAL
  Filled 2023-05-08 (×7): qty 1

## 2023-05-08 MED ORDER — METHOCARBAMOL 500 MG PO TABS
500.0000 mg | ORAL_TABLET | Freq: Three times a day (TID) | ORAL | Status: DC
  Administered 2023-05-08 – 2023-05-10 (×6): 500 mg via ORAL
  Filled 2023-05-08 (×7): qty 1

## 2023-05-08 MED ORDER — SODIUM PERTECHNETATE TC 99M INJECTION
10.3000 | Freq: Once | INTRAVENOUS | Status: AC
  Administered 2023-05-08: 10.3 via INTRAVENOUS

## 2023-05-08 MED ORDER — HYOSCYAMINE SULFATE 0.125 MG SL SUBL: 0.1250 mg | SUBLINGUAL_TABLET | Freq: Four times a day (QID) | SUBLINGUAL | Status: DC | PRN

## 2023-05-08 MED ORDER — POLYETHYLENE GLYCOL 3350 17 G PO PACK
17.0000 g | PACK | Freq: Every day | ORAL | Status: DC
  Administered 2023-05-08 – 2023-05-10 (×3): 17 g via ORAL
  Filled 2023-05-08 (×3): qty 1

## 2023-05-08 NOTE — Progress Notes (Signed)
[facility] Progress Note  Nicole Mclean 27 y.o. 1996/07/10   Subjective: Denies any bleeding overnight. No BMs.   Objective: Vital signs: Vitals:   05/08/23 0859 05/08/23 1032  BP: (!) 111/59 122/79  Pulse: 78 92  Resp:    Temp: 98.4 F (36.9 C)   SpO2: 99% 93%  R 20  Physical Exam: Gen: lethargic, no acute distress, obese HEENT: anicteric sclera CV: RRR Chest: CTA B Abd: lower abdominal tenderness without guarding, soft, nondistended, +BS Ext: no edema  Lab Results: Recent Labs    05/07/23 0516 05/08/23 0506  NA 138 135  K 3.9 3.8  CL 104 101  CO2 28 27  GLUCOSE 99 112*  BUN 14 15  CREATININE 0.63 0.82  CALCIUM 8.9 8.8*  MG 2.0 1.9   Recent Labs    05/06/23 1013  AST 13*  ALT 17  ALKPHOS 77  BILITOT 0.5  PROT 6.1*  ALBUMIN 3.2*   Recent Labs    05/07/23 1758 05/08/23 0506  WBC 6.6 6.9  HGB 7.5* 7.0*  HCT 23.4* 22.7*  MCV 90.0 90.1  PLT 287 267      Assessment/Plan: Obscure overt GI bleed - Meckel's scan today. Transfer needed to [facility] for double balloon enteroscopy. Supportive care.    Shirley Friar 05/08/2023, 12:03 PM  Questions please call [phone] ID: Nicole Mclean, female   DOB: Mar 06, 1996, 27 y.o.   MRN: [id number]

## 2023-05-08 NOTE — Plan of Care (Signed)
  Problem: Education: Goal: Knowledge of General Education information will improve Description: Including pain rating scale, medication(s)/side effects and non-pharmacologic comfort measures Outcome: Adequate for Discharge   Problem: Clinical Measurements: Goal: Respiratory complications will improve Outcome: Adequate for Discharge Goal: Cardiovascular complication will be avoided Outcome: Adequate for Discharge

## 2023-05-08 NOTE — Progress Notes (Signed)
[facility] Progress Note Patient: Nicole Mclean MVH:[id number] DOB: 10-May-1996 DOA: 05/04/2023  DOS: the patient was seen and examined on 05/08/2023  Brief hospital course: Nicole Mclean is a 27 y.o. female with PMH significant of HTN, preeclampsia, IDA, hemorrhoids present to the hospital with complaints of abdominal pain.  Underwent EGD which did not show any active bleeding. Now has capsule endoscopy.  Assessment and Plan: Upper GI bleeding. GI consulted. Underwent EGD.  No evidence of active bleeding seen so far. Underwent capsule endoscopy.  Shows evidence of active bleeding in the distal ileal area. Double-balloon push enteroscopy was recommended. I reached out to [facility] transfer center on 10/20. "Received a call back from the [facility] transfer center regarding the transfer request, Medical Director at [facility] Dr. Melinda Crutch suggested that they are at capacity and currently will have to decline your request at this time.  Call back again on 10/21 received a call from [facility] again and the transfer is declined again due to capacity. Based on their recommendation, we will call back tomorrow and see what they say.  Meckel's scan is negative.  Will follow GI recommendation now.  H&H relatively stable. Continue PPI twice daily.   Symptomatic anemia. Requiring 2 PRBC so far.  H&H stable.  Monitor. Transfer for hemoglobin less than 7.   Edematous appendix. Will follow GI recommendation. Concern for Meckel's diverticulum. No evidence of appendicitis.  Will monitor   Currently breast-feeding. Recommend patient to avoid breast-feeding while she is on opioids and butalbital   Mild hypokalemia. Replaced.   History of preeclampsia. Was on blood pressure medication currently on hold. Will monitor.   Headache likely in the setting of anemia. No focal deficit. Patient does have prior history of headaches as well.  Due to ongoing headache will check CT head of For now we will  monitor.  Bacterial vaginosis. Resuming home Flagyl.   Obesity Class 3 Body mass index is 47.26 kg/m.  Placing the pt at higher risk of poor outcomes.    Subjective: No nausea no vomiting.  Ongoing headache.  No fever no chills.  No BM since yesterday.  Physical Exam: General: in Mild distress, No Rash Cardiovascular: S1 and S2 Present, No Murmur Respiratory: Good respiratory effort, Bilateral Air entry present. No Crackles, No wheezes Abdomen: Bowel Sound present, diffuse moderate tenderness Extremities: No edema Neuro: Alert and oriented x3, no new focal deficit  Data Reviewed: I have Reviewed nursing notes, Vitals, and Lab results. Since last encounter, pertinent lab results CBC and BMP   . I have ordered test including CBC and BMP CT head  .   Disposition: Status is: Inpatient Remains inpatient appropriate because: Need for improvement in abdominal pain oral intake as well as H&H stability  SCDs Start: 05/05/23 0831   Family Communication: No one at bedside Level of care: Telemetry   Vitals:   05/08/23 0414 05/08/23 0859 05/08/23 1032 05/08/23 1514  BP: 117/66 (!) 111/59 122/79 128/68  Pulse: 82 78 92 86  Resp: 20     Temp: 98.7 F (37.1 C) 98.4 F (36.9 C)    TempSrc: Oral Oral    SpO2: 99% 99% 93%   Weight:      Height:         Author: Lynden Oxford, MD 05/08/2023 6:06 PM  Please look on [email or web address] to find out who is on call.

## 2023-05-08 NOTE — TOC CM/SW Note (Signed)
Transition of Care [facility]) - Inpatient Brief Assessment   Patient Details  Name: BHUVI BALLIS MRN: [id number] Date of Birth: Jan 30, 1996  Transition of [facility]) CM/SW Contact:    Darleene Cleaver, LCSW Phone Number: 05/08/2023, 4:52 PM   Clinical Narrative:  Patient does not have a PCP.  Patient does not have any other SDOH needs.  Plan to return back home.  Transition of Care Asessment: Insurance and Status: Insurance coverage has been reviewed Patient has primary care physician: No Home environment has been reviewed: Yes Prior level of function:: Independent Prior/Current Home Services: No current home services Social Determinants of Health Reivew: SDOH reviewed no interventions necessary Readmission risk has been reviewed: Yes Transition of care needs: no transition of care needs at this time

## 2023-05-09 DIAGNOSIS — K921 Melena: Secondary | ICD-10-CM | POA: Diagnosis not present

## 2023-05-09 LAB — BPAM RBC
Blood Product Expiration Date: 202411172359
Blood Product Expiration Date: 202411172359
Blood Product Expiration Date: 202411172359
ISSUE DATE / TIME: 202410180837
ISSUE DATE / TIME: 202410190442
Unit Type and Rh: 5100
Unit Type and Rh: 5100
Unit Type and Rh: 5100

## 2023-05-09 LAB — BASIC METABOLIC PANEL
Anion gap: 8 (ref 5–15)
BUN: 11 mg/dL (ref 6–20)
CO2: 29 mmol/L (ref 22–32)
Calcium: 9.4 mg/dL (ref 8.9–10.3)
Chloride: 102 mmol/L (ref 98–111)
Creatinine, Ser: 0.69 mg/dL (ref 0.44–1.00)
GFR, Estimated: >60 mL/min (ref >60)
Glucose, Bld: 102 mg/dL — ABNORMAL HIGH (ref 70–99)
Potassium: 4 mmol/L (ref 3.5–5.1)
Sodium: 139 mmol/L (ref 135–145)

## 2023-05-09 LAB — CBC
HCT: 22.3 % — ABNORMAL LOW (ref 36.0–46.0)
HCT: 28.9 % — ABNORMAL LOW (ref 36.0–46.0)
Hemoglobin: 6.9 g/dL — CL (ref 12.0–15.0)
Hemoglobin: 9.4 g/dL — ABNORMAL LOW (ref 12.0–15.0)
MCH: 28.3 pg (ref 26.0–34.0)
MCH: 28.8 pg (ref 26.0–34.0)
MCHC: 30.9 g/dL (ref 30.0–36.0)
MCHC: 32.5 g/dL (ref 30.0–36.0)
MCV: 91.4 fL (ref 80.0–100.0)
MCV: 88.7 fL (ref 80.0–100.0)
Platelets: 275 10*3/uL (ref 150–400)
Platelets: 301 10*3/uL (ref 150–400)
RBC: 2.44 MIL/uL — ABNORMAL LOW (ref 3.87–5.11)
RBC: 3.26 MIL/uL — ABNORMAL LOW (ref 3.87–5.11)
RDW: 14.3 % (ref 11.5–15.5)
RDW: 14.3 % (ref 11.5–15.5)
WBC: 6.9 10*3/uL (ref 4.0–10.5)
WBC: 7.2 10*3/uL (ref 4.0–10.5)
nRBC: 0 % (ref 0.0–0.2)
nRBC: 0 % (ref 0.0–0.2)

## 2023-05-09 LAB — TYPE AND SCREEN
ABO/RH(D): O POS
Antibody Screen: NEGATIVE
Unit division: 0
Unit division: 0
Unit division: 0

## 2023-05-09 LAB — PREPARE RBC (CROSSMATCH)

## 2023-05-09 LAB — MAGNESIUM: Magnesium: 2 mg/dL (ref 1.7–2.4)

## 2023-05-09 MED ORDER — SODIUM CHLORIDE 0.9% IV SOLUTION: Freq: Once | INTRAVENOUS | Status: AC

## 2023-05-09 MED ORDER — METHOCARBAMOL 500 MG PO TABS: 500.0000 mg | ORAL_TABLET | Freq: Three times a day (TID) | ORAL | Status: DC

## 2023-05-09 MED ORDER — PANTOPRAZOLE SODIUM 40 MG IV SOLR: 40.0000 mg | Freq: Two times a day (BID) | INTRAVENOUS | Status: DC

## 2023-05-09 MED ORDER — HYOSCYAMINE SULFATE 0.125 MG SL SUBL: 0.1250 mg | SUBLINGUAL_TABLET | Freq: Three times a day (TID) | SUBLINGUAL | Status: DC

## 2023-05-09 MED ORDER — POLYETHYLENE GLYCOL 3350 17 G PO PACK: 17.0000 g | PACK | Freq: Every day | ORAL | Status: DC

## 2023-05-09 MED ORDER — OXYCODONE HCL 5 MG PO TABS: 5.0000 mg | ORAL_TABLET | ORAL | Status: DC | PRN

## 2023-05-09 NOTE — Progress Notes (Signed)
[facility] Progress Note Patient: Nicole Mclean ZDG:[id number] DOB: 1995/11/01 DOA: 05/04/2023  DOS: the patient was seen and examined on 05/09/2023  Brief hospital course: Nicole Mclean is a 27 y.o. female with PMH significant of HTN, preeclampsia, IDA, hemorrhoids present to the hospital with complaints of abdominal pain.  Underwent EGD which did not show any active bleeding. Now has capsule endoscopy.  Assessment and Plan: Upper GI bleeding. GI consulted. Underwent EGD.  No evidence of active bleeding seen so far. Underwent capsule endoscopy.  Shows evidence of active bleeding in the distal ileal area. Double-balloon push enteroscopy was recommended. I reached out to [facility] transfer center on 10/20. "Received a call back from the [facility] transfer center regarding the transfer request, Medical Director at [facility] Dr. Melinda Crutch suggested that they are at capacity and currently will have to decline your request at this time.  Call back again on 10/21 received a call from [facility] again and the transfer is declined again due to capacity. On 10/22, GI was able to discuss with GI to get the patient on a wait list at [facility]. Meckel's scan is negative.  Will follow GI recommendation now.  H&H relatively stable. Continue PPI twice daily.   Symptomatic anemia. Requiring 2 PRBC so far.  H&H continues to drop. Due to ongoing headache as well as dizziness I would recommend to transfuse for hemoglobin less than 8. Will provide 2 units of transfusion for now.     Edematous appendix. Will follow GI recommendation. No evidence of appendicitis.  Will monitor   Currently breast-feeding. Recommend patient to avoid breast-feeding while she is on opioids and butalbital   Mild hypokalemia. Replaced.   History of preeclampsia. Was on blood pressure medication currently on hold. Will monitor.   Headache likely in the setting of anemia. No focal deficit. Patient does have prior history of headaches as  well.  Due to ongoing headache CT head was performed which is unremarkable. For now we will monitor.  Bacterial vaginosis. Resuming home Flagyl.   Obesity Class 3 Body mass index is 47.26 kg/m.  Placing the pt at higher risk of poor outcomes.    Subjective: No nausea no vomiting no fever no chills.  Physical Exam: General: in Mild distress, No Rash Cardiovascular: S1 and S2 Present, No Murmur Respiratory: Good respiratory effort, Bilateral Air entry present. No Crackles, No wheezes Abdomen: Bowel Sound present, diffuse abdominal tenderness Extremities: No edema Neuro: Alert and oriented x3, no new focal deficit  Data Reviewed: I have Reviewed nursing notes, Vitals, and Lab results. Since last encounter, pertinent lab results CBC and BMP   . I have ordered test including CBC and BMP  . I have discussed pt's care plan and test results with [facility]  .   Disposition: Status is: Inpatient Remains inpatient appropriate because: Need for improvement in H&H and transfer to [facility]  SCDs Start: 05/05/23 0831   Family Communication: No one at bedside Level of care: Telemetry   Vitals:   05/09/23 1140 05/09/23 1412 05/09/23 1429 05/09/23 1720  BP: 124/70 (!) 104/50 105/60 115/65  Pulse: 66 69 68 68  Resp: 16 15 14 17   Temp: 98.4 F (36.9 C) 99.2 F (37.3 C) (!) 97.5 F (36.4 C) 98.2 F (36.8 C)  TempSrc: Axillary Oral Oral Oral  SpO2: 100% 99% 100% 100%  Weight:      Height:         Author: Lynden Oxford, MD 05/09/2023 8:16 PM  Please look  on [email or web address] to find out who is on call.

## 2023-05-09 NOTE — Progress Notes (Signed)
Pt has had several PIVs infiltrate so far this admission. If IV access will be needed in the future pt will benefit from either midline or PICC line placement.

## 2023-05-09 NOTE — Discharge Summary (Signed)
Physician Discharge Summary   Patient: Nicole Mclean MRN: [id number] DOB: April 15, 1996  Admit date:     05/04/2023  Discharge date: 05/09/23  Discharge Physician: Lynden Oxford  PCP: Patient, No Pcp Per  Recommendations at discharge: Patient will be transferred to [facility] for double-balloon enteroscopy and further workup.  Discharge Diagnoses: Principal Problem:   GI bleeding Active Problems:   S/P cesarean section   Brief hospital course: Nicole Mclean is a 27 y.o. female with PMH significant of HTN, preeclampsia, IDA, hemorrhoids present to the hospital with complaints of abdominal pain.  Underwent EGD which did not show any active bleeding. Now has capsule videoscopic showed evidence of small bowel bleeding.  Recommendation is for transfer to [facility] for double-balloon enteroscopy.  Assessment and Plan: Upper GI bleeding. GI consulted. Underwent EGD.  No evidence of active bleeding seen so far. Underwent capsule endoscopy.  Shows evidence of active bleeding in the distal ileal area. Double-balloon push enteroscopy was recommended. I reached out to [facility] transfer center on 10/20. "Received a call back from the [facility] transfer center regarding the transfer request, Medical Director at [facility] Dr. Melinda Crutch suggested that they are at capacity and currently will have to decline your request at this time.  Call back again on 10/21 received a call from [facility] again and the transfer is declined again due to capacity. On 10/22, GI was able to discuss with GI to get the patient on a wait list at [facility]. Meckel's scan is negative.  Will follow GI recommendation now.  H&H relatively stable. Continue PPI twice daily.   Symptomatic anemia. Requiring 2 PRBC so far.  H&H continues to drop. Due to ongoing headache as well as dizziness I would recommend to transfuse for hemoglobin less than 8. Will provide 2 units of transfusion for now.     Edematous appendix. Will follow GI recommendation. No evidence  of appendicitis.  Will monitor   Currently breast-feeding. Recommend patient to avoid breast-feeding while she is on opioids and butalbital   Mild hypokalemia. Replaced.   History of preeclampsia. Was on blood pressure medication currently on hold. Will monitor.   Headache likely in the setting of anemia. No focal deficit. Patient does have prior history of headaches as well.  Due to ongoing headache CT head was performed which is unremarkable. For now we will monitor.  Bacterial vaginosis. Resuming home Flagyl.   Obesity Class 3 Body mass index is 47.26 kg/m.  Placing the pt at higher risk of poor outcomes.   Consultants:  [facility]  Procedures performed:  4 PRBC transfusion. EGD. Capsule endoscopy  DISCHARGE MEDICATION: Allergies as of 05/09/2023   No Known Allergies      Medication List     TAKE these medications    hyoscyamine 0.125 MG SL tablet Commonly known as: LEVSIN SL Place 1 tablet (0.125 mg total) under the tongue 3 (three) times daily.   methocarbamol 500 MG tablet Commonly known as: ROBAXIN Take 1 tablet (500 mg total) by mouth 3 (three) times daily.   metroNIDAZOLE 500 MG tablet Commonly known as: FLAGYL Take 1 tablet (500 mg total) by mouth 2 (two) times daily.   oxyCODONE 5 MG immediate release tablet Commonly known as: Oxy IR/ROXICODONE Take 1 tablet (5 mg total) by mouth every 4 (four) hours as needed for moderate pain (pain score 4-6).   pantoprazole 40 MG injection Commonly known as: PROTONIX Inject 40 mg into the vein every 12 (twelve) hours.   polyethylene glycol 17  g packet Commonly known as: MIRALAX / GLYCOLAX Take 17 g by mouth daily. Start taking on: May 10, 2023   Prenatal Vitamin Plus Low Iron 27-1 MG Tabs Take 1 tablet by mouth daily.       Disposition: Outside Hospital Diet recommendation: Regular diet  Discharge Exam: Vitals:   05/09/23 1140 05/09/23 1412 05/09/23 1429 05/09/23 1720  BP: 124/70 (!)  104/50 105/60 115/65  Pulse: 66 69 68 68  Resp: 16 15 14 17   Temp: 98.4 F (36.9 C) 99.2 F (37.3 C) (!) 97.5 F (36.4 C) 98.2 F (36.8 C)  TempSrc: Axillary Oral Oral Oral  SpO2: 100% 99% 100% 100%  Weight:      Height:       General: Appear in mild distress; no visible Abnormal Neck Mass Or lumps, Conjunctiva normal Cardiovascular: S1 and S2 Present, no Murmur, Respiratory: good respiratory effort, Bilateral Air entry present and CTA, no Crackles, no wheezes Abdomen: Bowel Sound present, diffusely tender Extremities: no Pedal edema Neurology: alert and oriented to time, place, and person  [facility] Weights   05/04/23 2009 05/05/23 0128  Weight: 112 kg 113.4 kg   Condition at discharge: stable  The results of significant diagnostics from this hospitalization (including imaging, microbiology, ancillary and laboratory) are listed below for reference.   Imaging Studies: CT HEAD WO CONTRAST ( )  Result Date: 05/09/2023 CLINICAL DATA:  27 year old female with severe headache. Dizziness. Recent abdominal pain. EXAM: CT HEAD WITHOUT CONTRAST TECHNIQUE: Contiguous axial images were obtained from the base of the skull through the vertex without intravenous contrast. RADIATION DOSE REDUCTION: This exam was performed according to the departmental dose-optimization program which includes automated exposure control, adjustment of the mA and/or kV according to patient size and/or use of iterative reconstruction technique. COMPARISON:  Head CT 11/24/2020. FINDINGS: Brain: Cerebral volume is stable and within normal limits. No midline shift, ventriculomegaly, mass effect, evidence of mass lesion, intracranial hemorrhage or evidence of cortically based acute infarction. Gray-white matter differentiation is within normal limits throughout the brain. Vascular: No suspicious intracranial vascular hyperdensity. Skull: Intact, negative. Sinuses/Orbits: Visualized paranasal sinuses and mastoids are stable and  well aerated. Tympanic cavities are clear. Other: Visualized orbits and scalp soft tissues are within normal limits. IMPRESSION: Normal noncontrast Head CT. Electronically Signed   By: Odessa Fleming M.D.   On: 05/09/2023 08:07   NM Bowel Img Meckels  Result Date: 05/08/2023 CLINICAL DATA:  Concern for Meckel's diverticulum.  Ileal bleeding. EXAM: NUCLEAR MEDICINE MECKELS SCAN TECHNIQUE: Planar dynamic images of the abdomen and pelvis performed over 1 hour. RADIOPHARMACEUTICALS:  10.4 millicuries technetium 99 pertechnetate COMPARISON:  CT 05/11/2023 FINDINGS: No abnormal radiotracer accumulation within the abdomen pelvis to localize ectopic gastric mucosa. Physiologic activity noted within the stomach. IMPRESSION: No evidence of ectopic gastric mucosa (Meckel's diverticulum). Electronically Signed   By: Genevive Bi M.D.   On: 05/08/2023 14:26   CT ABDOMEN PELVIS W CONTRAST  Result Date: 05/04/2023 CLINICAL DATA:  Acute nonlocalized abdominal pain EXAM: CT ABDOMEN AND PELVIS WITH CONTRAST TECHNIQUE: Multidetector CT imaging of the abdomen and pelvis was performed using the standard protocol following bolus administration of intravenous contrast. RADIATION DOSE REDUCTION: This exam was performed according to the departmental dose-optimization program which includes automated exposure control, adjustment of the mA and/or kV according to patient size and/or use of iterative reconstruction technique. CONTRAST:  OMNIPAQUE IOHEXOL 300 MG/ML  SOLN COMPARISON:  CT abdomen and pelvis 05/01/2023 FINDINGS: Lower chest: No acute abnormality. Hepatobiliary: Unremarkable  liver. Normal gallbladder. No biliary dilation. Pancreas: Unremarkable. Spleen: Unremarkable. Adrenals/Urinary Tract: Normal adrenal glands. No urinary calculi or hydronephrosis. Bladder is unremarkable. Stomach/Bowel: Normal caliber large and small bowel. No bowel wall thickening. Stomach is within normal limits. The appendix again measures 9 mm in  diameter. No hyperemia, periappendiceal stranding or free fluid. Vascular/Lymphatic: No significant vascular findings are present. No enlarged abdominal or pelvic lymph nodes. Reproductive: Unremarkable. Other: No free intraperitoneal fluid or air. Musculoskeletal: No acute fracture. IMPRESSION: No acute abnormality in the abdomen or pelvis. Unchanged mild enlargement of the appendix without inflammatory changes. Electronically Signed   By: Minerva Fester M.D.   On: 05/04/2023 21:59   CT ABDOMEN PELVIS W CONTRAST  Result Date: 05/01/2023 CLINICAL DATA:  Acute non localized abdominal pain. EXAM: CT ABDOMEN AND PELVIS WITH CONTRAST TECHNIQUE: Multidetector CT imaging of the abdomen and pelvis was performed using the standard protocol following bolus administration of intravenous contrast. RADIATION DOSE REDUCTION: This exam was performed according to the departmental dose-optimization program which includes automated exposure control, adjustment of the mA and/or kV according to patient size and/or use of iterative reconstruction technique. CONTRAST:  75mL OMNIPAQUE IOHEXOL 350 MG/ML SOLN COMPARISON:  None Available. FINDINGS: Lower Chest: No acute findings. Hepatobiliary: No suspicious hepatic masses identified. Gallbladder is unremarkable. No evidence of biliary ductal dilatation. Pancreas:  No mass or inflammatory changes. Spleen: Within normal limits in size and appearance. Adrenals/Urinary Tract: No suspicious masses identified. No evidence of ureteral calculi or hydronephrosis. Stomach/Bowel: No evidence of bowel obstruction or abnormal fluid collections. The appendix is mildly enlarged measuring 9 mm in diameter, however, there is no evidence of periappendiceal inflammatory changes. Vascular/Lymphatic: No pathologically enlarged lymph nodes. No acute vascular findings. Reproductive:  No mass or other significant abnormality. Other:  None. Musculoskeletal:  No suspicious bone lesions identified. IMPRESSION:  Mildly enlarged appendix, without evidence of periappendiceal inflammatory changes. Early acute appendicitis cannot be excluded. Recommend clinical correlation, and consider short-term follow-up CT in 24 hours if clinically warranted. No other significant abnormality identified. Electronically Signed   By: Danae Orleans M.D.   On: 05/01/2023 17:19    Microbiology: Results for orders placed or performed during the hospital encounter of 05/01/23  Resp panel by RT-PCR (RSV, Flu A&B, Covid) Anterior Nasal Swab     Status: None   Collection Time: 05/01/23 11:57 AM   Specimen: Anterior Nasal Swab  Result Value Ref Range Status   SARS Coronavirus 2 by RT PCR NEGATIVE NEGATIVE Final   Influenza A by PCR NEGATIVE NEGATIVE Final   Influenza B by PCR NEGATIVE NEGATIVE Final    Comment: (NOTE) The Xpert Xpress SARS-CoV-2/FLU/RSV plus assay is intended as an aid in the diagnosis of influenza from Nasopharyngeal swab specimens and should not be used as a sole basis for treatment. Nasal washings and aspirates are unacceptable for Xpert Xpress SARS-CoV-2/FLU/RSV testing.  Fact Sheet for Patients: BloggerCourse.com  Fact Sheet for Healthcare Providers: SeriousBroker.it  This test is not yet approved or cleared by the [place] FDA and has been authorized for detection and/or diagnosis of SARS-CoV-2 by FDA under an Emergency Use Authorization (EUA). This EUA will remain in effect (meaning this test can be used) for the duration of the COVID-19 declaration under Section 564(b)(1) of the Act, 21 U.S.C. section 360bbb-3(b)(1), unless the authorization is terminated or revoked.     Resp Syncytial Virus by PCR NEGATIVE NEGATIVE Final    Comment: (NOTE) Fact Sheet for Patients: BloggerCourse.com  Fact Sheet for Healthcare  Providers: SeriousBroker.it  This test is not yet approved or cleared by the  [place] and has been authorized for detection and/or diagnosis of SARS-CoV-2 by FDA under an Emergency Use Authorization (EUA). This EUA will remain in effect (meaning this test can be used) for the duration of the COVID-19 declaration under Section 564(b)(1) of the Act, 21 U.S.C. section 360bbb-3(b)(1), unless the authorization is terminated or revoked.  Performed at [facility], [address], [place], [place] [address]    Labs: CBC: Recent Labs  Lab 05/07/23 1758 05/08/23 0506 05/08/23 1441 05/08/23 2148 05/09/23 0545  WBC 6.6 6.9 6.8 7.1 6.9  HGB 7.5* 7.0* 7.3* 7.2* 6.9*  HCT 23.4* 22.7* 23.2* 22.8* 22.3*  MCV 90.0 90.1 89.9 90.1 91.4  PLT 287 267 277 301 275   Basic Metabolic Panel: Recent Labs  Lab 05/04/23 2011 05/06/23 1013 05/07/23 0516 05/08/23 0506 05/09/23 0545  NA 137 141 138 135 139  K 3.4* 3.8 3.9 3.8 4.0  CL 99 107 104 101 102  CO2 24 27 28 27 29   GLUCOSE 97 92 99 112* 102*  BUN 17 13 14 15 11   CREATININE 0.68 0.56 0.63 0.82 0.69  CALCIUM 8.8* 9.0 8.9 8.8* 9.4  MG  --   --  2.0 1.9 2.0   Liver Function Tests: Recent Labs  Lab 05/04/23 2011 05/06/23 1013  AST 15 13*  ALT 21 17  ALKPHOS 96 77  BILITOT 0.3 0.5  PROT 7.1 6.1*  ALBUMIN 3.9 3.2*   CBG: No results for input(s): "GLUCAP" in the last 168 hours.  Discharge time spent: greater than 30 minutes.  Author: Lynden Oxford, MD  [facility]

## 2023-05-09 NOTE — Progress Notes (Signed)
[facility] Gastroenterology Progress Note  Nicole Mclean 27 y.o. [birth year]   Subjective: Black and bloody stool last night. Abdominal pain recurrent but improved with pain meds.  Objective: Vital signs: Vitals:   05/09/23 1412 05/09/23 1429  BP: (!) 104/50 105/60  Pulse: 69 68  Resp: 15 14  Temp: 99.2 F (37.3 C) (!) 97.5 F (36.4 C)  SpO2: 99% 100%    Physical Exam: Gen: lethargic, obese, no acute distress, pleasant HEENT: anicteric sclera CV: RRR Chest: CTA B Abd: soft, nontender, nondistended, +BS Ext: no edema  Lab Results: Recent Labs    05/08/23 0506 05/09/23 0545  NA 135 139  K 3.8 4.0  CL 101 102  CO2 27 29  GLUCOSE 112* 102*  BUN 15 11  CREATININE 0.82 0.69  CALCIUM 8.8* 9.4  MG 1.9 2.0   No results for input(s): "AST", "ALT", "ALKPHOS", "BILITOT", "PROT", "ALBUMIN" in the last 72 hours. Recent Labs    05/08/23 2148 05/09/23 0545  WBC 7.1 6.9  HGB 7.2* 6.9*  HCT 22.8* 22.3*  MCV 90.1 91.4  PLT 301 275      Assessment/Plan: Obscure overt bleeding - signs of active bleeding mid-small bowel on capsule endoscopy and ongoing signs of bleeding with hematochezia and Hgb 6.9 with dizziness. Agree with blood transfusions. Spoke with Dr. Alois Cliche from Int Med service at [facility] for transfer and patient accepted by them but on waitlist for a bed. Unable to reach Dr. Theodore Demark from [facility]. Patient needs to go there as inpt for double balloon enteroscopy (DBE) and with blood transfusion requirements not stable for d/c to go there as outpt. If no bed opens in next 36-48 hours then may need to go there for DBE and transfer back here after procedure but ideally needs inpt transfer due to recurrent bleeding. Supportive care.   Nicole Mclean 05/09/2023, 4:08 PM  Questions please call [phone] ID: Nicole Mclean, female   DOB: 02-Mar-1996, 27 y.o.   MRN: [id number]

## 2023-05-10 ENCOUNTER — Ambulatory Visit: Payer: No Typology Code available for payment source | Admitting: Gastroenterology

## 2023-05-10 DIAGNOSIS — D649 Anemia, unspecified: Secondary | ICD-10-CM | POA: Diagnosis not present

## 2023-05-10 DIAGNOSIS — D62 Acute posthemorrhagic anemia: Secondary | ICD-10-CM | POA: Insufficient documentation

## 2023-05-10 DIAGNOSIS — K921 Melena: Secondary | ICD-10-CM | POA: Diagnosis not present

## 2023-05-10 LAB — BPAM RBC
Blood Product Expiration Date: 202411172359
Blood Product Expiration Date: 202411202359
ISSUE DATE / TIME: 202410221358
ISSUE DATE / TIME: 202410220927
Unit Type and Rh: 5100
Unit Type and Rh: 5100

## 2023-05-10 LAB — TYPE AND SCREEN
ABO/RH(D): O POS
Antibody Screen: NEGATIVE
Unit division: 0
Unit division: 0

## 2023-05-10 LAB — BASIC METABOLIC PANEL
Anion gap: 5 (ref 5–15)
BUN: 11 mg/dL (ref 6–20)
CO2: 29 mmol/L (ref 22–32)
Calcium: 9.2 mg/dL (ref 8.9–10.3)
Chloride: 105 mmol/L (ref 98–111)
Creatinine, Ser: 0.79 mg/dL (ref 0.44–1.00)
GFR, Estimated: >60 mL/min (ref >60)
Glucose, Bld: 105 mg/dL — ABNORMAL HIGH (ref 70–99)
Potassium: 4 mmol/L (ref 3.5–5.1)
Sodium: 139 mmol/L (ref 135–145)

## 2023-05-10 LAB — CBC
HCT: 29 % — ABNORMAL LOW (ref 36.0–46.0)
Hemoglobin: 9.4 g/dL — ABNORMAL LOW (ref 12.0–15.0)
MCH: 28.7 pg (ref 26.0–34.0)
MCHC: 32.4 g/dL (ref 30.0–36.0)
MCV: 88.4 fL (ref 80.0–100.0)
Platelets: 282 10*3/uL (ref 150–400)
RBC: 3.28 MIL/uL — ABNORMAL LOW (ref 3.87–5.11)
RDW: 14.4 % (ref 11.5–15.5)
WBC: 7.2 10*3/uL (ref 4.0–10.5)
nRBC: 0 % (ref 0.0–0.2)

## 2023-05-10 MED ORDER — PANTOPRAZOLE SODIUM 40 MG PO TBEC
40.0000 mg | DELAYED_RELEASE_TABLET | Freq: Once | ORAL | Status: AC
  Administered 2023-05-10: 40 mg via ORAL
  Filled 2023-05-10: qty 1

## 2023-05-10 NOTE — Progress Notes (Signed)
Report called to Jon Gills, RN at [facility]. Patient awaiting transport.

## 2023-05-10 NOTE — Plan of Care (Signed)
  Problem: Pain Managment: Goal: General experience of comfort will improve Outcome: Progressing   Problem: Safety: Goal: Ability to remain free from injury will improve Outcome: Progressing   Problem: Skin Integrity: Goal: Risk for impaired skin integrity will decrease Outcome: Progressing   

## 2023-05-10 NOTE — Progress Notes (Signed)
[facility] Gastroenterology Progress Note  Nicole Mclean 27 y.o. 23-Mar-1996   Subjective: Rectal bleeding (black stools) and abdominal pain overnight.  Objective: Vital signs: Vitals:   05/09/23 2259 05/10/23 1143  BP: 134/75 109/68  Pulse: 76 68  Resp: 18 19  Temp: 97.8 F (36.6 C) 98.2 F (36.8 C)  SpO2: 99% 97%    Physical Exam: Gen: lethargic, well-nourished, no acute distress  HEENT: anicteric sclera CV: RRR Chest: CTA B Abd: soft, nontender, nondistended, +BS Ext: no edema  Lab Results: Recent Labs    05/08/23 0506 05/09/23 0545 05/10/23 0520  NA 135 139 139  K 3.8 4.0 4.0  CL 101 102 105  CO2 27 29 29   GLUCOSE 112* 102* 105*  BUN 15 11 11   CREATININE 0.82 0.69 0.79  CALCIUM 8.8* 9.4 9.2  MG 1.9 2.0  --    No results for input(s): "AST", "ALT", "ALKPHOS", "BILITOT", "PROT", "ALBUMIN" in the last 72 hours. Recent Labs    05/09/23 2037 05/10/23 0520  WBC 7.2 7.2  HGB 9.4* 9.4*  HCT 28.9* 29.0*  MCV 88.7 88.4  PLT 301 282      Assessment/Plan: Small bowel bleed - source not known. Awaiting transfer to [facility] for inpt bed for double balloon enteroscopy. Hgb stable today. Blood transfusions prn. Supportive care.   Shirley Friar 05/10/2023, 2:56 PM  Questions please call [phone] ID: Nicole Mclean, female   DOB: 1996/05/04, 27 y.o.   MRN: [id number]

## 2023-05-10 NOTE — Progress Notes (Signed)
[facility] PROGRESS NOTE    Progress Note  Nicole Mclean  XLK:[id number] DOB: 1995/09/10 DOA: 05/04/2023 PCP: Patient, No Pcp Per     Brief Narrative:   Nicole Mclean is an 27 y.o. female past medical history of preeclampsia and hypertension iron deficiency anemia comes to the hospital complaining of abdominal pain underwent EGD did not show any active bleeding   Assessment/Plan:   Possible upper GI bleeding: GI was consulted for for endoscopy showed no active bleeding. Underwent capsule endoscopy that showed evidence of active bleeding in the distal ileal area GI recommended a double-balloon push enteroscopy. [facility] portal was reach out on 05/07/2023 and again on 05/09/2023 by GI and the patient was placed on a waiting list medical scan was negative. Continue PPI twice a day hemoglobin has remained relatively stable.  Symptomatic anemia: Status post units packed red blood cells, her hemoglobin is trending up this morning is 9.4.  Edematous appendix No evidence of appendicitis follow-up with GI as an outpatient.  Breast-feeding: Noted.  Patient has been counseled on avoiding breast-feeding while she is on opiates and barbiturates  Hypokalemia: Repleted.  Headache: No focal deficits.  Bacterial vaginosis: Continue current regimen.  Obesity: Noted.  DVT prophylaxis: lovenox Family Communication:none Status is: Inpatient Remains inpatient appropriate because: Upper GI bleed    Code Status:     Code Status Orders  (From admission, onward)           Start     Ordered   05/05/23 0831  Full code  Continuous       Question:  By:  Answer:  Consent: discussion documented in EHR   05/05/23 0831           Code Status History     Date Active Date Inactive Code Status Order ID Comments User Context   01/15/2023 2347 01/17/2023 0828 Full Code [id number]  Myrtie Hawk, DO Inpatient   01/15/2023 2346 01/15/2023 2347 Full Code [id number]  Tereso Newcomer, MD Inpatient   10/10/2022 1725 10/12/2022 1528 Full Code [id number]  Adam Phenix, MD Inpatient   07/02/2015 0607 07/03/2015 1535 Full Code [id number]  Roe Coombs, CNM Inpatient   07/01/2015 0940 07/02/2015 0607 Full Code [id number]  Elson Areas, RN Inpatient   06/07/2014 0424 06/08/2014 1833 Full Code [id number]  Levi Aland, MD Inpatient   06/06/2014 1418 06/07/2014 0424 Full Code [id number]  Sharen Hint, RN Inpatient         IV Access:   Peripheral IV   Procedures and diagnostic studies:   CT HEAD WO CONTRAST ( )  Result Date: 05/09/2023 CLINICAL DATA:  27 year old female with severe headache. Dizziness. Recent abdominal pain. EXAM: CT HEAD WITHOUT CONTRAST TECHNIQUE: Contiguous axial images were obtained from the base of the skull through the vertex without intravenous contrast. RADIATION DOSE REDUCTION: This exam was performed according to the departmental dose-optimization program which includes automated exposure control, adjustment of the mA and/or kV according to patient size and/or use of iterative reconstruction technique. COMPARISON:  Head CT 11/24/2020. FINDINGS: Brain: Cerebral volume is stable and within normal limits. No midline shift, ventriculomegaly, mass effect, evidence of mass lesion, intracranial hemorrhage or evidence of cortically based acute infarction. Gray-white matter differentiation is within normal limits throughout the brain. Vascular: No suspicious intracranial vascular hyperdensity. Skull: Intact, negative. Sinuses/Orbits: Visualized paranasal sinuses and mastoids are stable and well aerated. Tympanic cavities are clear. Other: Visualized orbits and scalp soft tissues are within normal limits. IMPRESSION: Normal  noncontrast Head CT. Electronically Signed   By: Odessa Fleming M.D.   On: 05/09/2023 08:07   NM Bowel Img Meckels  Result Date: 05/08/2023 CLINICAL DATA:  Concern for Meckel's diverticulum.  Ileal bleeding. EXAM: NUCLEAR  MEDICINE MECKELS SCAN TECHNIQUE: Planar dynamic images of the abdomen and pelvis performed over 1 hour. RADIOPHARMACEUTICALS:  10.4 millicuries technetium 99 pertechnetate COMPARISON:  CT 05/11/2023 FINDINGS: No abnormal radiotracer accumulation within the abdomen pelvis to localize ectopic gastric mucosa. Physiologic activity noted within the stomach. IMPRESSION: No evidence of ectopic gastric mucosa (Meckel's diverticulum). Electronically Signed   By: Genevive Bi M.D.   On: 05/08/2023 14:26     Medical Consultants:   None.   Subjective:    Nicole Mclean she relates she had black tarry stools this morning.  Objective:    Vitals:   05/09/23 1412 05/09/23 1429 05/09/23 1720 05/09/23 2259  BP: (!) 104/50 105/60 115/65 134/75  Pulse: 69 68 68 76  Resp: 15 14 17 18   Temp: 99.2 F (37.3 C) (!) 97.5 F (36.4 C) 98.2 F (36.8 C) 97.8 F (36.6 C)  TempSrc: Oral Oral Oral Oral  SpO2: 99% 100% 100% 99%  Weight:      Height:       SpO2: 99 %   Intake/Output Summary (Last 24 hours) at 05/10/2023 1026 Last data filed at 05/09/2023 1715 Gross per 24 hour  Intake 724 ml  Output --  Net 724 ml   Filed Weights   05/04/23 2009 05/05/23 0128  Weight: 112 kg 113.4 kg    Exam: General exam: In no acute distress. Respiratory system: Good air movement and clear to auscultation. Cardiovascular system: S1 & S2 heard, RRR. No JVD. Gastrointestinal system: Abdomen is nondistended, soft and nontender.  Extremities: No pedal edema. Skin: No rashes, lesions or ulcers Psychiatry: Judgement and insight appear normal. Mood & affect appropriate.    Data Reviewed:    Labs: Basic Metabolic Panel: Recent Labs  Lab 05/06/23 1013 05/07/23 0516 05/08/23 0506 05/09/23 0545 05/10/23 0520  NA 141 138 135 139 139  K 3.8 3.9 3.8 4.0 4.0  CL 107 104 101 102 105  CO2 27 28 27 29 29   GLUCOSE 92 99 112* 102* 105*  BUN 13 14 15 11 11   CREATININE 0.56 0.63 0.82 0.69 0.79  CALCIUM 9.0 8.9  8.8* 9.4 9.2  MG  --  2.0 1.9 2.0  --    GFR Estimated Creatinine Clearance: 122.3 mL/min (by C-G formula based on SCr of 0.79 mg/dL). Liver Function Tests: Recent Labs  Lab 05/04/23 2011 05/06/23 1013  AST 15 13*  ALT 21 17  ALKPHOS 96 77  BILITOT 0.3 0.5  PROT 7.1 6.1*  ALBUMIN 3.9 3.2*   No results for input(s): "LIPASE", "AMYLASE" in the last 168 hours. No results for input(s): "AMMONIA" in the last 168 hours. Coagulation profile No results for input(s): "INR", "PROTIME" in the last 168 hours. COVID-19 Labs  No results for input(s): "DDIMER", "FERRITIN", "LDH", "CRP" in the last 72 hours.  Lab Results  Component Value Date   SARSCOV2NAA NEGATIVE 05/01/2023   SARSCOV2NAA NEGATIVE 05/22/2021   SARSCOV2NAA Not Detected 06/07/2019    CBC: Recent Labs  Lab 05/08/23 1441 05/08/23 2148 05/09/23 0545 05/09/23 2037 05/10/23 0520  WBC 6.8 7.1 6.9 7.2 7.2  HGB 7.3* 7.2* 6.9* 9.4* 9.4*  HCT 23.2* 22.8* 22.3* 28.9* 29.0*  MCV 89.9 90.1 91.4 88.7 88.4  PLT 277 301 275 301 282  Cardiac Enzymes: No results for input(s): "CKTOTAL", "CKMB", "CKMBINDEX", "TROPONINI" in the last 168 hours. BNP (last 3 results) No results for input(s): "PROBNP" in the last 8760 hours. CBG: No results for input(s): "GLUCAP" in the last 168 hours. D-Dimer: No results for input(s): "DDIMER" in the last 72 hours. Hgb A1c: No results for input(s): "HGBA1C" in the last 72 hours. Lipid Profile: No results for input(s): "CHOL", "HDL", "LDLCALC", "TRIG", "CHOLHDL", "LDLDIRECT" in the last 72 hours. Thyroid function studies: No results for input(s): "TSH", "T4TOTAL", "T3FREE", "THYROIDAB" in the last 72 hours.  Invalid input(s): "FREET3" Anemia work up: No results for input(s): "VITAMINB12", "FOLATE", "FERRITIN", "TIBC", "IRON", "RETICCTPCT" in the last 72 hours. Sepsis Labs: Recent Labs  Lab 05/08/23 2148 05/09/23 0545 05/09/23 2037 05/10/23 0520  WBC 7.1 6.9 7.2 7.2    Microbiology Recent Results (from the past 240 hour(s))  Resp panel by RT-PCR (RSV, Flu A&B, Covid) Anterior Nasal Swab     Status: None   Collection Time: 05/01/23 11:57 AM   Specimen: Anterior Nasal Swab  Result Value Ref Range Status   SARS Coronavirus 2 by RT PCR NEGATIVE NEGATIVE Final   Influenza A by PCR NEGATIVE NEGATIVE Final   Influenza B by PCR NEGATIVE NEGATIVE Final    Comment: (NOTE) The Xpert Xpress SARS-CoV-2/FLU/RSV plus assay is intended as an aid in the diagnosis of influenza from Nasopharyngeal swab specimens and should not be used as a sole basis for treatment. Nasal washings and aspirates are unacceptable for Xpert Xpress SARS-CoV-2/FLU/RSV testing.  Fact Sheet for Patients: BloggerCourse.com  Fact Sheet for Healthcare Providers: SeriousBroker.it  This test is not yet approved or cleared by the [place] FDA and has been authorized for detection and/or diagnosis of SARS-CoV-2 by FDA under an Emergency Use Authorization (EUA). This EUA will remain in effect (meaning this test can be used) for the duration of the COVID-19 declaration under Section 564(b)(1) of the Act, 21 U.S.C. section 360bbb-3(b)(1), unless the authorization is terminated or revoked.     Resp Syncytial Virus by PCR NEGATIVE NEGATIVE Final    Comment: (NOTE) Fact Sheet for Patients: BloggerCourse.com  Fact Sheet for Healthcare Providers: SeriousBroker.it  This test is not yet approved or cleared by the [place] FDA and has been authorized for detection and/or diagnosis of SARS-CoV-2 by FDA under an Emergency Use Authorization (EUA). This EUA will remain in effect (meaning this test can be used) for the duration of the COVID-19 declaration under Section 564(b)(1) of the Act, 21 U.S.C. section 360bbb-3(b)(1), unless the authorization is terminated or revoked.  Performed at  [facility], [address], [place], [place] [address]      Medications:    hyoscyamine  0.125 mg Sublingual TID   methocarbamol  500 mg Oral TID   metroNIDAZOLE  500 mg Oral Q12H   pantoprazole (PROTONIX) IV  40 mg Intravenous Q12H   polyethylene glycol  17 g Oral Daily   Continuous Infusions:    LOS: 5 days   Marinda Elk  [facility]  05/10/2023, 10:26 AM

## 2023-05-17 ENCOUNTER — Other Ambulatory Visit: Payer: Self-pay

## 2023-05-17 ENCOUNTER — Encounter (HOSPITAL_COMMUNITY): Payer: Self-pay

## 2023-05-17 ENCOUNTER — Emergency Department (HOSPITAL_COMMUNITY)
Admission: EM | Admit: 2023-05-17 | Discharge: 2023-05-18 | Disposition: A | Discharge status: Home / Self Care | Payer: No Typology Code available for payment source | Attending: Emergency Medicine | Admitting: Emergency Medicine

## 2023-05-17 DIAGNOSIS — R1030 Lower abdominal pain, unspecified: Secondary | ICD-10-CM | POA: Insufficient documentation

## 2023-05-17 DIAGNOSIS — K921 Melena: Secondary | ICD-10-CM

## 2023-05-17 DIAGNOSIS — K625 Hemorrhage of anus and rectum: Secondary | ICD-10-CM | POA: Diagnosis present

## 2023-05-17 DIAGNOSIS — R197 Diarrhea, unspecified: Secondary | ICD-10-CM | POA: Diagnosis not present

## 2023-05-17 DIAGNOSIS — R110 Nausea: Secondary | ICD-10-CM | POA: Insufficient documentation

## 2023-05-17 MED ORDER — ONDANSETRON HCL 4 MG/2ML IJ SOLN
4.0000 mg | Freq: Once | INTRAMUSCULAR | Status: AC
  Administered 2023-05-18: 4 mg via INTRAVENOUS
  Filled 2023-05-17: qty 2

## 2023-05-17 MED ORDER — MORPHINE SULFATE (PF) 4 MG/ML IV SOLN
4.0000 mg | Freq: Once | INTRAVENOUS | Status: AC
  Administered 2023-05-18: 4 mg via INTRAVENOUS
  Filled 2023-05-17: qty 1

## 2023-05-17 NOTE — ED Provider Notes (Signed)
[place] EMERGENCY DEPARTMENT AT [facility] Provider Note   CSN: [id number] Arrival date & time: 05/17/23  2254     History {Add pertinent medical, surgical, social history, OB history to HPI:1} Chief Complaint  Patient presents with   Rectal Bleeding   Abdominal Pain    Nicole Mclean is a 27 y.o. female, history of ileum mass, who presents to the ED for acute stools, and feeling, weak, after having a double balloon enteroscopy today at [facility].  Reports a mass, with an ulceration, is found in her ileum, and biopsies were taken.  She was discharged home with minimal pain, got home about an hour or 2 ago, and started having a lot of abdominal pain.  She states that she is now having maroon blood leaking from her anus, not associated with bowel movements, but later she did have a bowel movement, which had large clots of blood in it.  She states she called the GI follow-up from [facility], and was told to come to the ER for further evaluation.  She reports bad nausea, but no vomiting.  Has a few episodes of diarrhea since then.  Is not on any blood thinners.  Denies any syncopal episodes.  Home Medications Prior to Admission medications   Medication Sig Start Date End Date Taking? Authorizing Provider  hyoscyamine (LEVSIN SL) 0.125 MG SL tablet Place 1 tablet (0.125 mg total) under the tongue 3 (three) times daily. 05/09/23   Rolly Salter, MD  methocarbamol (ROBAXIN) 500 MG tablet Take 1 tablet (500 mg total) by mouth 3 (three) times daily. 05/09/23   Rolly Salter, MD  metroNIDAZOLE (FLAGYL) 500 MG tablet Take 1 tablet (500 mg total) by mouth 2 (two) times daily. 03/30/23   Warden Fillers, MD  oxyCODONE (OXY IR/ROXICODONE) 5 MG immediate release tablet Take 1 tablet (5 mg total) by mouth every 4 (four) hours as needed for moderate pain (pain score 4-6). 05/09/23   Rolly Salter, MD  pantoprazole (PROTONIX) 40 MG injection Inject 40 mg into the vein every 12 (twelve)  hours. 05/09/23   Rolly Salter, MD  polyethylene glycol (MIRALAX / GLYCOLAX) 17 g packet Take 17 g by mouth daily. 05/10/23   Rolly Salter, MD  Prenatal Vit-Fe Fumarate-FA (PRENATAL VITAMIN PLUS LOW IRON) 27-1 MG TABS Take 1 tablet by mouth daily. 07/25/22   Constant, Peggy, MD      Allergies    Patient has no known allergies.    Review of Systems   Review of Systems  Gastrointestinal:  Positive for abdominal pain, blood in stool, diarrhea and nausea.    Physical Exam Updated Vital Signs BP 116/71   Pulse (!) 57   Temp 98.4 F (36.9 C) (Oral)   Resp 16   SpO2 96%  Physical Exam Vitals and nursing note reviewed.  Constitutional:      General: She is not in acute distress.    Appearance: She is well-developed.  HENT:     Head: Normocephalic and atraumatic.  Eyes:     Conjunctiva/sclera: Conjunctivae normal.  Cardiovascular:     Rate and Rhythm: Normal rate and regular rhythm.     Heart sounds: No murmur heard. Pulmonary:     Effort: Pulmonary effort is normal. No respiratory distress.     Breath sounds: Normal breath sounds.  Abdominal:     Palpations: Abdomen is soft.     Tenderness: There is abdominal tenderness in the right lower quadrant and  left lower quadrant.  Musculoskeletal:        General: No swelling.     Cervical back: Neck supple.  Skin:    General: Skin is warm and dry.     Capillary Refill: Capillary refill takes less than 2 seconds.  Neurological:     Mental Status: She is alert.  Psychiatric:        Mood and Affect: Mood normal.     ED Results / Procedures / Treatments   Labs (all labs ordered are listed, but only abnormal results are displayed) Labs Reviewed  CBC WITH DIFFERENTIAL/PLATELET  COMPREHENSIVE METABOLIC PANEL  LIPASE, BLOOD  HCG, QUANTITATIVE, PREGNANCY  URINALYSIS, ROUTINE W REFLEX MICROSCOPIC  I-STAT CHEM 8, ED    EKG None  Radiology No results found.  Procedures Procedures  {Document cardiac monitor, telemetry  assessment procedure when appropriate:1}  Medications Ordered in ED Medications  morphine (PF) 4 MG/ML injection 4 mg (has no administration in time range)  ondansetron (ZOFRAN) injection 4 mg (has no administration in time range)    ED Course/ Medical Decision Making/ A&P   {   Click here for ABCD2, HEART and other calculatorsREFRESH Note before signing :1}                              Medical Decision Making  ***  {Document critical care time when appropriate:1} {Document review of labs and clinical decision tools ie heart score, Chads2Vasc2 etc:1}  {Document your independent review of radiology images, and any outside records:1} {Document your discussion with family members, caretakers, and with consultants:1} {Document social determinants of health affecting pt's care:1} {Document your decision making why or why not admission, treatments were needed:1} Final Clinical Impression(s) / ED Diagnoses Final diagnoses:  None    Rx / DC Orders ED Discharge Orders     None

## 2023-05-17 NOTE — ED Provider Triage Note (Signed)
Emergency Medicine Provider Triage Evaluation Note  Nicole Mclean , a 27 y.o. female  was evaluated in triage.  Pt complains of lower abdominal pain and bright red blood per rectum.  Patient was just in the hospital for presumed upper GI bleeding and was found to have bleeding in the small bowel on capsule endoscopy.  There were attempts to transfer her to [facility] for a double-balloon procedure however the patient had improvement of her bleeding and was discharged home and actually had that procedure done today as an outpatient.  She was found to have a mass that was biopsied the procedure had reportedly gone well but when she got home had the current symptoms.  She had called her provider who encouraged her to come to the local ER for CT imaging..  Review of Systems  Positive: BRBPR Negative: fever  Physical Exam  BP 116/71   Pulse (!) 57   Temp 98.4 F (36.9 C) (Oral)   Resp 16   SpO2 96%  Gen:   Awake, no distress   Resp:  Normal effort  MSK:   Moves extremities without difficulty  Other:  Mild diffuse lower abdominal discomfort  Medical Decision Making  Medically screening exam initiated at 11:06 PM.  Appropriate orders placed.  Arabia E Band was informed that the remainder of the evaluation will be completed by another provider, this initial triage assessment does not replace that evaluation, and the importance of remaining in the ED until their evaluation is complete.  2 yoF with a chief complaints of lower abdominal pain and bright red blood per rectum.  There is a note in the system from the GI fellow at [facility], he had recommended a CT angiogram of the abdomen pelvis with contrast.  Depending on the complication it was thought that she could perhaps benefit from IR intervention if that was feasible.  If there is no obvious cause on CT and she continued to have bleeding then it was recommended to try to transfer her back to [facility].   Melene Plan, DO 05/17/23 2308

## 2023-05-17 NOTE — ED Triage Notes (Signed)
Patient BIB [facility] from home after balloon endoscopy early today and now has peri-umbilical abdominal pain and rectal bleeding. Patient has hx of same. A&Ox4, upset with EMS, VSS, initial BP 152/98, HR 98, 98% RA, RR 22

## 2023-05-18 ENCOUNTER — Emergency Department (HOSPITAL_COMMUNITY): Payer: No Typology Code available for payment source

## 2023-05-18 ENCOUNTER — Telehealth: Payer: Self-pay | Admitting: *Deleted

## 2023-05-18 LAB — CBC WITH DIFFERENTIAL/PLATELET
Abs Immature Granulocytes: 0.04 10*3/uL (ref 0.00–0.07)
Basophils Absolute: 0 10*3/uL (ref 0.0–0.1)
Basophils Relative: 0 %
Eosinophils Absolute: 0 10*3/uL (ref 0.0–0.5)
Eosinophils Relative: 0 %
HCT: 32.4 % — ABNORMAL LOW (ref 36.0–46.0)
Hemoglobin: 10.2 g/dL — ABNORMAL LOW (ref 12.0–15.0)
Immature Granulocytes: 0 %
Lymphocytes Relative: 14 %
Lymphs Abs: 1.5 10*3/uL (ref 0.7–4.0)
MCH: 27.4 pg (ref 26.0–34.0)
MCHC: 31.5 g/dL (ref 30.0–36.0)
MCV: 87.1 fL (ref 80.0–100.0)
Monocytes Absolute: 0.2 10*3/uL (ref 0.1–1.0)
Monocytes Relative: 2 %
Neutro Abs: 9 10*3/uL — ABNORMAL HIGH (ref 1.7–7.7)
Neutrophils Relative %: 84 %
Platelets: 474 10*3/uL — ABNORMAL HIGH (ref 150–400)
RBC: 3.72 MIL/uL — ABNORMAL LOW (ref 3.87–5.11)
RDW: 13.2 % (ref 11.5–15.5)
WBC: 10.6 10*3/uL — ABNORMAL HIGH (ref 4.0–10.5)
nRBC: 0 % (ref 0.0–0.2)

## 2023-05-18 LAB — COMPREHENSIVE METABOLIC PANEL
ALT: 26 U/L (ref 0–44)
AST: 18 U/L (ref 15–41)
Albumin: 4 g/dL (ref 3.5–5.0)
Alkaline Phosphatase: 102 U/L (ref 38–126)
Anion gap: 10 (ref 5–15)
BUN: 11 mg/dL (ref 6–20)
CO2: 22 mmol/L (ref 22–32)
Calcium: 9.5 mg/dL (ref 8.9–10.3)
Chloride: 107 mmol/L (ref 98–111)
Creatinine, Ser: 0.85 mg/dL (ref 0.44–1.00)
GFR, Estimated: >60 mL/min (ref >60)
Glucose, Bld: 144 mg/dL — ABNORMAL HIGH (ref 70–99)
Potassium: 3.7 mmol/L (ref 3.5–5.1)
Sodium: 139 mmol/L (ref 135–145)
Total Bilirubin: 0.4 mg/dL (ref 0.3–1.2)
Total Protein: 7.3 g/dL (ref 6.5–8.1)

## 2023-05-18 LAB — URINALYSIS, ROUTINE W REFLEX MICROSCOPIC
Bilirubin Urine: NEGATIVE
Glucose, UA: NEGATIVE mg/dL
Hgb urine dipstick: NEGATIVE
Ketones, ur: 5 mg/dL — AB
Leukocytes,Ua: NEGATIVE
Nitrite: NEGATIVE
Protein, ur: NEGATIVE mg/dL
Specific Gravity, Urine: 1.025 (ref 1.005–1.030)
pH: 6 (ref 5.0–8.0)

## 2023-05-18 LAB — HCG, QUANTITATIVE, PREGNANCY: hCG, Beta Chain, Quant, S: <1 m[IU]/mL (ref <5)

## 2023-05-18 LAB — LIPASE, BLOOD: Lipase: 20 U/L (ref 11–51)

## 2023-05-18 MED ORDER — HYOSCYAMINE SULFATE 0.125 MG SL SUBL: 0.1250 mg | SUBLINGUAL_TABLET | Freq: Three times a day (TID) | SUBLINGUAL | Status: DC

## 2023-05-18 MED ORDER — METHOCARBAMOL 500 MG PO TABS: 500.0000 mg | ORAL_TABLET | Freq: Three times a day (TID) | ORAL | Status: DC | PRN

## 2023-05-18 MED ORDER — IOHEXOL 350 MG/ML SOLN
75.0000 mL | Freq: Once | INTRAVENOUS | Status: AC | PRN
  Administered 2023-05-18: 75 mL via INTRAVENOUS

## 2023-05-18 MED ORDER — MORPHINE SULFATE (PF) 4 MG/ML IV SOLN
4.0000 mg | Freq: Once | INTRAVENOUS | Status: AC
  Administered 2023-05-18: 4 mg via INTRAVENOUS
  Filled 2023-05-18: qty 1

## 2023-05-18 NOTE — Discharge Instructions (Signed)
Your CTA was reassuring, there is no evidence of any kind of large bleed on your exam.  The blood in your stool was likely secondary to the biopsy that they took today.  If you have worsening abdominal plain please return to the ER.  The pain may be from the gas from the endoscopy, you can lay on this on your side, or use a heating pad, to help with the pain.  I resent the medications as you have requested to a different pharmacy

## 2023-05-18 NOTE — Telephone Encounter (Signed)
Pt called regarding which pharmacy Rx was e-scribed to.  RNCM reviewed chart to access After Visit Summary and found that Rx was printed.  RNCMcalled in Rx as written as pt did not receive written Rx.  Advised pt to pick up at her convenience.

## 2023-05-19 ENCOUNTER — Telehealth: Payer: No Typology Code available for payment source | Admitting: Physician Assistant

## 2023-05-19 ENCOUNTER — Encounter: Payer: Self-pay | Admitting: Physician Assistant

## 2023-05-19 DIAGNOSIS — K922 Gastrointestinal hemorrhage, unspecified: Secondary | ICD-10-CM

## 2023-05-19 NOTE — Patient Instructions (Addendum)
  Libbi E Para March, thank you for joining [name], PA-C for today's virtual visit.  While this provider is not your primary care provider (PCP), if your PCP is located in our provider database this encounter information will be shared with them immediately following your visit.   A [facility] MyChart account gives you access to today's visit and all your visits, tests, and labs performed at [facility] " click here if you don't have a [facility] MyChart account or go to [email or web address]  Consent: (Patient) Nicole Mclean provided verbal consent for this virtual visit at the beginning of the encounter.  Current Medications:  Current Outpatient Medications:    acetaminophen (TYLENOL) 500 MG tablet, Take 1,000 mg by mouth 2 (two) times daily as needed for moderate pain (pain score 4-6) or mild pain (pain score 1-3) (Stomach pain)., Disp: , Rfl:    hyoscyamine (LEVSIN SL) 0.125 MG SL tablet, Place 1 tablet (0.125 mg total) under the tongue 3 (three) times daily., Disp: 20 tablet, Rfl:    methocarbamol (ROBAXIN) 500 MG tablet, Take 1 tablet (500 mg total) by mouth every 8 (eight) hours as needed for muscle spasms., Disp: 30 tablet, Rfl:    ondansetron (ZOFRAN-ODT) 4 MG disintegrating tablet, Take by mouth every 8 (eight) hours as needed for nausea or vomiting., Disp: , Rfl:    oxyCODONE (OXY IR/ROXICODONE) 5 MG immediate release tablet, Take 1 tablet (5 mg total) by mouth every 4 (four) hours as needed for moderate pain (pain score 4-6)., Disp: , Rfl:    pantoprazole (PROTONIX) 40 MG injection, Inject 40 mg into the vein every 12 (twelve) hours., Disp: , Rfl:    Medications ordered in this encounter:  No orders of the defined types were placed in this encounter.    *If you need refills on other medications prior to your next appointment, please contact your pharmacy*  Follow-Up: Call back or seek an in-person evaluation if the symptoms worsen or if the condition  fails to improve as anticipated.  [facility] [phone]  Other Instructions - Call [facility], Dr. Chevis Pretty - Monitor for clot size to increase again or bleeding to increase - Monitor for signs of anemia (blood count dropping again): Shortness of breath, heart racing/palpitations, weakness, fatigue, cold intolerance, feeling like you are going to pass out - If either of the two occur seek immediate medical assistance at [facility] ER - Do not strain or push with bowel movements - Add a stool softener daily - Add MiraLax when using Oxycodone to prevent constipation - Straining and hard BM will increase bleeding    If you have been instructed to have an in-person evaluation today at a local Urgent Care facility, please use the link below. It will take you to a list of all of our available [place] [facility], including address, phone number and hours of operation. Please do not delay care.  [facility]  If you or a family member do not have a primary care provider, use the link below to schedule a visit and establish care. When you choose a [facility] primary care physician or advanced practice provider, you gain a long-term partner in health. Find a Primary Care Provider  Learn more about [facility]'s in-office and virtual care options: [place] - Get Care Now

## 2023-05-19 NOTE — Progress Notes (Signed)
Virtual Visit Consent   Nicole Mclean, you are scheduled for a virtual visit with a [facility] provider today. Just as with appointments in the office, your consent must be obtained to participate. Your consent will be active for this visit and any virtual visit you may have with one of our providers in the next 365 days. If you have a MyChart account, a copy of this consent can be sent to you electronically.  As this is a virtual visit, video technology does not allow for your provider to perform a traditional examination. This may limit your provider's ability to fully assess your condition. If your provider identifies any concerns that need to be evaluated in person or the need to arrange testing (such as labs, EKG, etc.), we will make arrangements to do so. Although advances in technology are sophisticated, we cannot ensure that it will always work on either your end or our end. If the connection with a video visit is poor, the visit may have to be switched to a telephone visit. With either a video or telephone visit, we are not always able to ensure that we have a secure connection.  By engaging in this virtual visit, you consent to the provision of healthcare and authorize for your insurance to be billed (if applicable) for the services provided during this visit. Depending on your insurance coverage, you may receive a charge related to this service.  I need to obtain your verbal consent now. Are you willing to proceed with your visit today? Nicole Mclean has provided verbal consent on 05/19/2023 for a virtual visit (video or telephone). [name], PA-C  Date: 05/19/2023 10:53 AM  Virtual Visit via Video Note   I, [name], connected with  Nicole Mclean  ([id number], Dec 07, 1995) on 05/19/23 at  8:30 AM EDT by a video-enabled telemedicine application and verified that I am speaking with the correct person using two identifiers.  Location: Patient: Virtual Visit Location  Patient: Home Provider: Virtual Visit Location Provider: Home Office   I discussed the limitations of evaluation and management by telemedicine and the availability of in person appointments. The patient expressed understanding and agreed to proceed.    History of Present Illness: Nicole Mclean is a 27 y.o. who identifies as a female who was assigned female at birth, and is being seen today for continued lower GI bleed.   Had C-section on 01/15/23 due to severe preeclampsia.  05/01/23 seen initially for GI bleeding in ER. Seen again on 05/04/23 as she had become symptomatic with anemia (HgB 6.9). Was admitted to the hospital and transferred to [facility]. She underwent a capsule endoscopy and this is where the lower bleed was found. Discharged on 05/14/23.  She underwent two balloon enteroscopies on 05/17/23. There was a mass with an ulceration noted and had a biopsy, path pending. Following this procedure she started passing clots with BM at home. Called [facility] and they advised her to be seen in person at local ER. Went to ER, [facility], on 05/17/23. Had CT angio that was negative for acute bleed. Rectal exam negative for blood in rectum. HgB was up to 10.2. She has continued to have blood clots when she has BM. Had one larger one last night then anther this morning, but was smaller than last night. Patient sent picture of clots through Mychart messaging for today. She is still having abdominal cramping, mild nausea, and abdominal pain. She has been continuing to use  Pantoprazole as prescribed, hyoscyamine for cramping as needed. Limits use of Robaxin and Oxycodone due to drowsiness side effects but will use them when she has someone to help take care of her baby.     Problems:  Patient Active Problem List   Diagnosis Date Noted   GI bleeding 05/04/2023   Postpartum care following cesarean delivery 03/07/2023   Contraception management 03/07/2023   S/P cesarean section 01/17/2023   Other iron  deficiency anemias 04/30/2015    Allergies: No Known Allergies Medications:  Current Outpatient Medications:    acetaminophen (TYLENOL) 500 MG tablet, Take 1,000 mg by mouth 2 (two) times daily as needed for moderate pain (pain score 4-6) or mild pain (pain score 1-3) (Stomach pain)., Disp: , Rfl:    hyoscyamine (LEVSIN SL) 0.125 MG SL tablet, Place 1 tablet (0.125 mg total) under the tongue 3 (three) times daily., Disp: 20 tablet, Rfl:    methocarbamol (ROBAXIN) 500 MG tablet, Take 1 tablet (500 mg total) by mouth every 8 (eight) hours as needed for muscle spasms., Disp: 30 tablet, Rfl:    ondansetron (ZOFRAN-ODT) 4 MG disintegrating tablet, Take by mouth every 8 (eight) hours as needed for nausea or vomiting., Disp: , Rfl:    oxyCODONE (OXY IR/ROXICODONE) 5 MG immediate release tablet, Take 1 tablet (5 mg total) by mouth every 4 (four) hours as needed for moderate pain (pain score 4-6)., Disp: , Rfl:    pantoprazole (PROTONIX) 40 MG injection, Inject 40 mg into the vein every 12 (twelve) hours., Disp: , Rfl:   Observations/Objective: Patient is well-developed, well-nourished in no acute distress.  Resting comfortably at home.  Head is normocephalic, atraumatic.  No labored breathing.  Speech is clear and coherent with logical content.  Patient is alert and oriented at baseline.    Assessment and Plan: 1. Lower GI bleed  - Call [facility], Dr. Chevis Pretty - Monitor for clot size to increase again or bleeding to increase - Monitor for signs of anemia (blood count dropping again): Shortness of breath, heart racing/palpitations, weakness, fatigue, cold intolerance, feeling like you are going to pass out - If either of the two occur seek immediate medical assistance at [facility] - Do not strain or push with bowel movements - Add a stool softener daily - Add MiraLax when using Oxycodone to prevent constipation - Straining and hard BM will increase bleeding   Follow Up Instructions: I discussed  the assessment and treatment plan with the patient. The patient was provided an opportunity to ask questions and all were answered. The patient agreed with the plan and demonstrated an understanding of the instructions.  A copy of instructions were sent to the patient via MyChart unless otherwise noted below.    The patient was advised to call back or seek an in-person evaluation if the symptoms worsen or if the condition fails to improve as anticipated.    [name], PA-C

## 2023-06-02 DIAGNOSIS — Z01818 Encounter for other preprocedural examination: Secondary | ICD-10-CM | POA: Insufficient documentation

## 2023-06-05 ENCOUNTER — Other Ambulatory Visit: Payer: Self-pay | Admitting: General Surgery

## 2023-06-05 DIAGNOSIS — D3A8 Other benign neuroendocrine tumors: Secondary | ICD-10-CM | POA: Insufficient documentation

## 2023-06-05 DIAGNOSIS — C7A8 Other malignant neuroendocrine tumors: Secondary | ICD-10-CM

## 2023-06-05 DIAGNOSIS — K56600 Partial intestinal obstruction, unspecified as to cause: Secondary | ICD-10-CM | POA: Insufficient documentation

## 2023-06-29 NOTE — Pre-Procedure Instructions (Signed)
Surgical Instructions   Your procedure is scheduled on Wednesday, December 18th. Report to [facility] Main Entrance "A" at 07:00 A.M., then check in with the Admitting office. Any questions or running late day of surgery: call [phone]  Questions prior to your surgery date: call [phone], Monday-Friday, 8am-4pm. If you experience any cold or flu symptoms such as cough, fever, chills, shortness of breath, etc. between now and your scheduled surgery, please notify us at the above number.     Remember:  Do not eat after midnight the night before your surgery   You may drink clear liquids until 06:00 AM the morning of your surgery.   Clear liquids allowed are: Water, Non-Citrus Juices (without pulp), Carbonated Beverages, Clear Tea (no milk, honey, etc.), Black Coffee Only (NO MILK, CREAM OR POWDERED CREAMER of any kind), and Gatorade.  Patient Instructions  The night before surgery:  No food after midnight. ONLY clear liquids after midnight  The day of surgery (if you do NOT have diabetes):  Drink ONE (1) Pre-Surgery Clear Ensure by 06:00 AM the morning of surgery. Drink in one sitting. Do not sip.  This drink was given to you during your hospital  pre-op appointment visit.  Nothing else to drink after completing the  Pre-Surgery Clear Ensure.          If you have questions, please contact your surgeon's office.    Take these medicines the morning of surgery with A SIP OF WATER  May take these medicines IF NEEDED: acetaminophen (TYLENOL)    One week prior to surgery, STOP taking any Aspirin (unless otherwise instructed by your surgeon) Aleve, Naproxen, Ibuprofen, Motrin, Advil, Goody's, BC's, all herbal medications, fish oil, and non-prescription vitamins.                     Do NOT Smoke (Tobacco/Vaping) for 24 hours prior to your procedure.  If you use a CPAP at night, you may bring your mask/headgear for your overnight stay.   You will be asked to remove any  contacts, glasses, piercing's, hearing aid's, dentures/partials prior to surgery. Please bring cases for these items if needed.    Patients discharged the day of surgery will not be allowed to drive home, and someone needs to stay with them for 24 hours.  SURGICAL WAITING ROOM VISITATION Patients may have no more than 2 support people in the waiting area - these visitors may rotate.   Pre-op nurse will coordinate an appropriate time for 1 ADULT support person, who may not rotate, to accompany patient in pre-op.  Children under the age of 47 must have an adult with them who is not the patient and must remain in the main waiting area with an adult.  If the patient needs to stay at the hospital during part of their recovery, the visitor guidelines for inpatient rooms apply.  Please refer to the [facility] website for the visitor guidelines for any additional information.   If you received a COVID test during your pre-op visit  it is requested that you wear a mask when out in public, stay away from anyone that may not be feeling well and notify your surgeon if you develop symptoms. If you have been in contact with anyone that has tested positive in the last 10 days please notify you surgeon.      Pre-operative CHG Bathing Instructions   You can play a key role in reducing the risk of infection after surgery. Your skin  needs to be as free of germs as possible. You can reduce the number of germs on your skin by washing with CHG (chlorhexidine gluconate) soap before surgery. CHG is an antiseptic soap that kills germs and continues to kill germs even after washing.   DO NOT use if you have an allergy to chlorhexidine/CHG or antibacterial soaps. If your skin becomes reddened or irritated, stop using the CHG and notify one of our RNs at [phone].              TAKE A SHOWER THE NIGHT BEFORE SURGERY AND THE DAY OF SURGERY    Please keep in mind the following:  DO NOT shave, including legs and  underarms, 48 hours prior to surgery.   You may shave your face before/day of surgery.  Place clean sheets on your bed the night before surgery Use a clean washcloth (not used since being washed) for each shower. DO NOT sleep with pet's night before surgery.  CHG Shower Instructions:  Wash your face and private area with normal soap. If you choose to wash your hair, wash first with your normal shampoo.  After you use shampoo/soap, rinse your hair and body thoroughly to remove shampoo/soap residue.  Turn the water OFF and apply half the bottle of CHG soap to a CLEAN washcloth.  Apply CHG soap ONLY FROM YOUR NECK DOWN TO YOUR TOES (washing for 3-5 minutes)  DO NOT use CHG soap on face, private areas, open wounds, or sores.  Pay special attention to the area where your surgery is being performed.  If you are having back surgery, having someone wash your back for you may be helpful. Wait 2 minutes after CHG soap is applied, then you may rinse off the CHG soap.  Pat dry with a clean towel  Put on clean pajamas    Additional instructions for the day of surgery: DO NOT APPLY any lotions, deodorants, cologne, or perfumes.   Do not wear jewelry or makeup Do not wear nail polish, gel polish, artificial nails, or any other type of covering on natural nails (fingers and toes) Do not bring valuables to the hospital. [facility] is not responsible for valuables/personal belongings. Put on clean/comfortable clothes.  Please brush your teeth.  Ask your nurse before applying any prescription medications to the skin.

## 2023-06-30 ENCOUNTER — Other Ambulatory Visit: Payer: Self-pay

## 2023-06-30 ENCOUNTER — Encounter (HOSPITAL_COMMUNITY): Payer: Self-pay

## 2023-06-30 ENCOUNTER — Encounter (HOSPITAL_COMMUNITY)
Admission: RE | Admit: 2023-06-30 | Discharge: 2023-06-30 | Disposition: A | Discharge status: Home / Self Care | Payer: No Typology Code available for payment source | Source: Ambulatory Visit | Attending: General Surgery

## 2023-06-30 VITALS — BP 120/66 | HR 86 | Temp 98.4°F | Resp 18 | Ht 61.0 in | Wt 252.3 lb

## 2023-06-30 DIAGNOSIS — C7A8 Other malignant neuroendocrine tumors: Secondary | ICD-10-CM | POA: Diagnosis not present

## 2023-06-30 DIAGNOSIS — Z01818 Encounter for other preprocedural examination: Secondary | ICD-10-CM

## 2023-06-30 DIAGNOSIS — Z01812 Encounter for preprocedural laboratory examination: Secondary | ICD-10-CM | POA: Insufficient documentation

## 2023-06-30 HISTORY — DX: Other benign neuroendocrine tumors: D3A.8

## 2023-06-30 LAB — CBC WITH DIFFERENTIAL/PLATELET
Abs Immature Granulocytes: 0.02 10*3/uL (ref 0.00–0.07)
Basophils Absolute: 0.1 10*3/uL (ref 0.0–0.1)
Basophils Relative: 1 %
Eosinophils Absolute: 0.1 10*3/uL (ref 0.0–0.5)
Eosinophils Relative: 2 %
HCT: 30.3 % — ABNORMAL LOW (ref 36.0–46.0)
Hemoglobin: 9.5 g/dL — ABNORMAL LOW (ref 12.0–15.0)
Immature Granulocytes: 0 %
Lymphocytes Relative: 38 %
Lymphs Abs: 3.1 10*3/uL (ref 0.7–4.0)
MCH: 26.1 pg (ref 26.0–34.0)
MCHC: 31.4 g/dL (ref 30.0–36.0)
MCV: 83.2 fL (ref 80.0–100.0)
Monocytes Absolute: 0.5 10*3/uL (ref 0.1–1.0)
Monocytes Relative: 6 %
Neutro Abs: 4.3 10*3/uL (ref 1.7–7.7)
Neutrophils Relative %: 53 %
Platelets: 389 10*3/uL (ref 150–400)
RBC: 3.64 MIL/uL — ABNORMAL LOW (ref 3.87–5.11)
RDW: 14.1 % (ref 11.5–15.5)
WBC: 8.1 10*3/uL (ref 4.0–10.5)
nRBC: 0 % (ref 0.0–0.2)

## 2023-06-30 LAB — BASIC METABOLIC PANEL
Anion gap: 5 (ref 5–15)
BUN: 9 mg/dL (ref 6–20)
CO2: 26 mmol/L (ref 22–32)
Calcium: 9.2 mg/dL (ref 8.9–10.3)
Chloride: 107 mmol/L (ref 98–111)
Creatinine, Ser: 0.63 mg/dL (ref 0.44–1.00)
GFR, Estimated: >60 mL/min (ref >60)
Glucose, Bld: 100 mg/dL — ABNORMAL HIGH (ref 70–99)
Potassium: 3.9 mmol/L (ref 3.5–5.1)
Sodium: 138 mmol/L (ref 135–145)

## 2023-06-30 LAB — TYPE AND SCREEN
ABO/RH(D): O POS
Antibody Screen: NEGATIVE

## 2023-06-30 NOTE — Progress Notes (Addendum)
PCP - Dr. Salli Real Cardiologist - denies  PPM/ICD - denies   Chest x-ray - 05/23/21 EKG - 05/01/23 Stress Test - denies ECHO - denies Cardiac Cath - denies  Sleep Study - denies   DM- denies  Last dose of GLP1 agonist-  n/a   ASA/Blood Thinner Instructions: n/a   ERAS Protcol - yes PRE-SURGERY Ensure given at PAT  COVID TEST- n/a   Anesthesia review: yes, Hgb 9.5  Patient denies shortness of breath, fever, cough and chest pain at PAT appointment   All instructions explained to the patient, with a verbal understanding of the material. Patient agrees to go over the instructions while at home for a better understanding.  The opportunity to ask questions was provided.

## 2023-06-30 NOTE — Progress Notes (Signed)
Pt has received blood transfusion within 90 days. Type and Screen will need to be recollected DOS. Order placed.

## 2023-07-03 NOTE — H&P (Signed)
REFERRING PHYSICIAN:  Shirley Friar, MD PROVIDER:  Matthias Hughs, MD Patient Care Team: Salli Real, MD as PCP - General (Internal Medicine) Matthias Hughs, MD as Consulting Provider (Surgical Oncology) Shirley Friar, MD (Gastroenterology) Chevis Pretty Zorita Pang, MD as Consulting Provider (Gastroenterology)   MRN: [id number] DOB: 06/04/1996 Subjective    Plan Chief Complaint: Distal Ileal Mass   History of Present Illness: Nicole Mclean is a 27 y.o. female who is seen today as an office consultation for evaluation of Distal Ileal Mass   Patient presents 05/2023 with a new diagnosis of neuroendocrine tumor of the small bowel.  Patient was admitted to [facility] in October for acute GI bleeding.  She was seen to have melena.  A Meckel scan was ordered because of her age which was negative.  She had a CT angiogram which was also negative.  She had a capsule endoscopy that did show a spot in the distal third of the small bowel that was concerning for bleeding.  She subsequently had a push enteroscopy at [facility] and was found to have a an ulcerated small mass that was not circumferential and in the proximal ileum.  A biopsy was performed and this demonstrated a well-differentiated neuroendocrine tumor.  Since discharge, the patient has continued to have a constant dull ache with intermittent episodes of sharper pain in a band around her mid abdomen.  She has difficulty eating much and does throw up if she tries to eat large meals.  She is mostly eating soup.  She does continue to have some bloody bowel movements.     CT abd/pelvis 05/04/23 IMPRESSION: No acute abnormality in the abdomen or pelvis. Unchanged mild enlargement of the appendix without inflammatory changes.   Nm meckels scan 05/08/23 FINDINGS: No abnormal radiotracer accumulation within the abdomen pelvis to localize ectopic gastric mucosa. Physiologic activity noted within the stomach.  IMPRESSION: No evidence of  ectopic gastric mucosa (Meckel's diverticulum).   CT angio 05/18/23 IMPRESSION: No evidence of active GI bleeding. No identified source of bleeding.   Enteroscopy [facility] dr Chevis Pretty 05/17/23 - Normal esophagus. - Normal stomach. - Normal examined duodenum. - The examined portion of the jejunum was normal. - Likely malignant tumor in the proximal ileum query NET. Biopsied. Tattooed. Clip (MR conditional) was placed.   Pathology 05/17/23 [facility]  A.  Ileum, endoscopic biopsy:  Well differentiated neuroendocrine tumor. See note.   Note: Immunostains performed on block A1 show that the tumor cells are positive for pancytokeratin, synaptophysin, and chromogranin.  The morphology and immunophenotype support the above diagnosis.  Proliferation Index by AT2 Percent Positive Nuclei:  <1% Comment: According to the 2017 World Health Organization classification of gastroenteropancreatic neuroendocrine tumors, grading of neuroendocrine tumors is dependent on the H&E degree of differentiation, and mitotic activity or Ki-67 labeling index.   The Ki-67 proliferation index is based on the evaluation of > 500 cells in areas of higher nuclear staining (so-called hotspots).  The mitotic index is based on the evaluation of mitoses in 10 mm2 in areas of higher density, and is expressed as mitoses per 2 mm2.   The final grade is determined by whichever index (Ki-67 or mitotic) places the tumor in the highest grade category.  Classification Differentiation Grade (mitotic count/Ki-67 index)  Neuroendocrine tumor Well differentiated Grade 1, Low grade:  < 2 mitoses per 44mm2 and <3%  Neuroendocrine tumor Well differentiated Grade 2, Intermediate grade:  2-20 mitoses per 15mm2 and 3-20%  Neuroendocrine tumor Well differentiated Grade  3, High grade:  >20 mitoses per 20mm2 and >20%  Neuroendocrine carcinoma (NEC, small cell and large cell carcinoma) Poorly differentiated NEC is by definition High Grade >20 mitoses per 38mm2  and >20%    Based on the mitotic rate and the Ki-67 proliferation index, the final grade of this tumor is well differentiated, grade 1 of 3.     Review of Systems: A complete review of systems was obtained from the patient.  I have reviewed this information and discussed as appropriate with the patient.  See HPI as well for other ROS.   Review of Systems  Gastrointestinal:  Positive for abdominal pain and melena.  Neurological:  Positive for dizziness.  All other systems reviewed and are negative.       Medical History: Past Medical History      Past Medical History:  Diagnosis Date   Pre-eclampsia (HHS-HCC)     Preoperative evaluation to rule out surgical contraindication 06/02/2023        Problem List     Patient Active Problem List  Diagnosis   GI bleeding   Acute blood loss anemia   Morbid obesity with BMI of 45.0-49.9 (HCC)   Preoperative evaluation to rule out surgical contraindication   Neuroendocrine neoplasm of small intestine (CMS-HCC)   Partial small bowel obstruction (CMS/HHS-HCC)        Past Surgical History       Past Surgical History:  Procedure Laterality Date   facial reconstruction on her chin   2006   SIGMOIDOSCOPY FLEXIBLE   09/2022   CESAREAN SECTION   01/2023   DUODENUMOSCOPY W/BIOPSY N/A 05/17/2023    Procedure: SMALL INTESTINAL ENDOSCOPY, ENTEROSCOPY BEYOND SECOND PORTION OF DUODENUM, INCLUDING ILEUM; WITH BIOPSY, SINGLE OR MULTIPLE;  Surgeon: Jorene Minors, MD;  Location: [facility];  Service: Gastroenterology;  Laterality: N/A;   UNLISTED PROCEDURE INTESTINE   05/17/2023    Procedure: UNLISTED PROCEDURE, SMALL INTESTINE;  Surgeon: Jorene Minors, MD;  Location: [facility];  Service: Gastroenterology;;   hand surgery Left          Allergies  No Known Allergies     Medications Ordered Prior to Encounter        Current Outpatient Medications on File Prior to Visit  Medication Sig Dispense Refill    acetaminophen (TYLENOL) 325 MG tablet Take 650 mg by mouth every 4 (four) hours as needed for Pain       pantoprazole (PROTONIX) 40 MG DR tablet Take 1 tablet (40 mg total) by mouth 2 (two) times daily before meals 60 tablet 1   prenatal vitamin with iron-folic acid (PRENATAL TABLETS) tablet Take 1 tablet by mouth once daily        No current facility-administered medications on file prior to visit.        Family History       Family History  Adopted: Yes  Problem Relation Age of Onset   Colon cancer Mother     Obesity Father     Hyperlipidemia (Elevated cholesterol) Father     High blood pressure (Hypertension) Father     Diabetes Father     Anesthesia problems Neg Hx          Tobacco Use History  Social History       Tobacco Use  Smoking Status Never  Smokeless Tobacco Never        Social History  Social History         Socioeconomic History  Marital status: Single   Number of children: 3  Tobacco Use   Smoking status: Never   Smokeless tobacco: Never  Vaping Use   Vaping status: Never Used  Substance and Sexual Activity   Alcohol use: Not Currently   Drug use: Never   Sexual activity: Not Currently      Partners: Male    Social Drivers of Health        Food Insecurity: No Food Insecurity (05/05/2023)    Received from [facility]    Hunger Vital Sign     Worried About Running Out of Food in the Last Year: Never true     Ran Out of Food in the Last Year: Never true  Transportation Needs: No Transportation Needs (05/11/2023)    PRAPARE - Therapist, art (Medical): No     Lack of Transportation (Non-Medical): No        Objective:       Vitals:      BP: 118/78  Pulse: (!) 113  Temp: 36.8 C (98.2 F)  SpO2: 98%  Weight: (!) 112.1 kg (247 lb 3.2 oz)  Height: 154.9 cm (5\' 1" )  PainSc:   4    Body mass index is 46.71 kg/m.     Head:   Normocephalic and atraumatic.  Mouth/Throat: Oropharynx is clear and moist. No  oropharyngeal exudate.  Eyes:   Conjunctivae are normal. Pupils are equal, round, and reactive to light. No scleral icterus.  Neck:   Normal range of motion. Neck supple. No tracheal deviation present. No thyromegaly present.  Cardiovascular: Normal rate, regular rhythm,  and intact distal pulses.   Respiratory: Effort normal. No respiratory distress.  No chest wall tenderness.  GI:       Soft. Bowel sounds are normal. Abdomen is soft, non tender, non distended.  No masses or hepatosplenomegaly is present. There is no rebound and no guarding.  Musculoskeletal: . Extremities are non tender and without deformity.  Lymphadenopathy:   No cervical or axillary adenopathy.  Neurological: Alert and oriented to person, place, and time. Coordination normal.  Skin:    Skin is warm and dry. No rash noted. No diaphoresis. No erythema. No pallor.  Psychiatric: Normal mood and affect.Behavior is normal. Judgment and thought content normal.      Labs, Imaging and Diagnostic Testing: 05/18/23 CMET gluc 144 CBC HCT 32.4   Assessment and Plan:    Assessment Diagnoses and all orders for this visit:   Neuroendocrine neoplasm of small intestine (CMS-HCC)   Acute blood loss anemia   Gastrointestinal hemorrhage with melena   Partial small bowel obstruction (CMS/HHS-HCC)    Patient has a new diagnosis of small bowel neuroendocrine tumor.  I am going to order a Netspot.  However, regardless of whether she has metastatic disease, she will need surgery given that she is continuing to have bleeding and what sounds like partially obstructive symptoms.  Plan to do a laparoscopic small bowel resection.  I reviewed this with the patient.  I discussed port sites and there would be at least 1 incision to get up to pull the small bowel through and remove it and close it back up.  I advised that she would be in the hospital most likely 1 to 3 days while we await return of bowel function, get pain under control with oral  medications, and make sure that she has adequate intake to not get dehydrated.   I discussed postoperative restrictions including  no lifting over 10 pounds for around 4 weeks.  I discussed that if we need any larger incision that we would potentially need lifting restrictions for longer.     I discussed risks of surgery including bleeding, infection, damage to adjacent structures, heart or lung complications, blood clot, possible breakdown of the small bowel connection with need for additional procedures or surgeries, possible prolonged hospital stay, possible need for ostomy.

## 2023-07-04 NOTE — Anesthesia Preprocedure Evaluation (Signed)
Anesthesia Evaluation  Patient identified by MRN, date of birth, ID band Patient awake    Reviewed: Allergy & Precautions, NPO status , Patient's Chart, lab work & pertinent test results  History of Anesthesia Complications Negative for: history of anesthetic complications  Airway Mallampati: II  TM Distance: >3 FB Neck ROM: Full    Dental  (+) Dental Advisory Given   Pulmonary neg pulmonary ROS   breath sounds clear to auscultation       Cardiovascular hypertension (with pre-eclampsia, off of meds presently), (-) angina  Rhythm:Regular Rate:Normal     Neuro/Psych negative neurological ROS     GI/Hepatic Neg liver ROS,neg GERD  ,,Neuroendocrine tumor of in small bowel   Endo/Other    Class 4 obesity (BMI 47.7)  Renal/GU negative Renal ROS     Musculoskeletal   Abdominal   Peds  Hematology  (+) Blood dyscrasia (Hb 9.5, plt 389k), anemia   Anesthesia Other Findings   Reproductive/Obstetrics                             Anesthesia Physical Anesthesia Plan  ASA: 3  Anesthesia Plan: General   Post-op Pain Management: Tylenol PO (pre-op)*   Induction: Intravenous  PONV Risk Score and Plan: 3 and Dexamethasone, Scopolamine patch - Pre-op and Ondansetron  Airway Management Planned: Oral ETT  Additional Equipment: None  Intra-op Plan:   Post-operative Plan: Extubation in OR  Informed Consent: I have reviewed the patients History and Physical, chart, labs and discussed the procedure including the risks, benefits and alternatives for the proposed anesthesia with the patient or authorized representative who has indicated his/her understanding and acceptance.     Dental advisory given  Plan Discussed with: CRNA and Surgeon  Anesthesia Plan Comments:         Anesthesia Quick Evaluation

## 2023-07-05 ENCOUNTER — Inpatient Hospital Stay (HOSPITAL_COMMUNITY)
Admission: RE | Admit: 2023-07-05 | Discharge: 2023-07-16 | DRG: 827 | Disposition: A | Discharge status: Home / Self Care | Payer: No Typology Code available for payment source | Attending: General Surgery | Admitting: General Surgery

## 2023-07-05 ENCOUNTER — Inpatient Hospital Stay (HOSPITAL_COMMUNITY): Payer: Self-pay | Admitting: Anesthesiology

## 2023-07-05 ENCOUNTER — Encounter (HOSPITAL_COMMUNITY): Payer: Self-pay | Admitting: General Surgery

## 2023-07-05 ENCOUNTER — Inpatient Hospital Stay (HOSPITAL_COMMUNITY): Payer: Self-pay | Admitting: Physician Assistant

## 2023-07-05 ENCOUNTER — Other Ambulatory Visit: Payer: Self-pay

## 2023-07-05 ENCOUNTER — Encounter (HOSPITAL_COMMUNITY)
Admission: RE | Disposition: A | Discharge status: Home / Self Care | Payer: Self-pay | Source: Home / Self Care | Attending: General Surgery

## 2023-07-05 DIAGNOSIS — K921 Melena: Secondary | ICD-10-CM | POA: Diagnosis present

## 2023-07-05 DIAGNOSIS — Z6841 Body Mass Index (BMI) 40.0 and over, adult: Secondary | ICD-10-CM

## 2023-07-05 DIAGNOSIS — C7A8 Other malignant neuroendocrine tumors: Principal | ICD-10-CM | POA: Diagnosis present

## 2023-07-05 DIAGNOSIS — D3A8 Other benign neuroendocrine tumors: Secondary | ICD-10-CM | POA: Diagnosis not present

## 2023-07-05 DIAGNOSIS — C7A1 Malignant poorly differentiated neuroendocrine tumors: Secondary | ICD-10-CM | POA: Diagnosis present

## 2023-07-05 DIAGNOSIS — R42 Dizziness and giddiness: Secondary | ICD-10-CM | POA: Diagnosis not present

## 2023-07-05 DIAGNOSIS — Z01818 Encounter for other preprocedural examination: Secondary | ICD-10-CM

## 2023-07-05 DIAGNOSIS — K9189 Other postprocedural complications and disorders of digestive system: Secondary | ICD-10-CM | POA: Diagnosis present

## 2023-07-05 DIAGNOSIS — D62 Acute posthemorrhagic anemia: Secondary | ICD-10-CM | POA: Diagnosis present

## 2023-07-05 DIAGNOSIS — K56600 Partial intestinal obstruction, unspecified as to cause: Secondary | ICD-10-CM | POA: Diagnosis not present

## 2023-07-05 DIAGNOSIS — K567 Ileus, unspecified: Secondary | ICD-10-CM | POA: Diagnosis not present

## 2023-07-05 HISTORY — PX: LAPAROSCOPIC SMALL BOWEL RESECTION: SHX5929

## 2023-07-05 LAB — CREATININE, SERUM
Creatinine, Ser: 0.79 mg/dL (ref 0.44–1.00)
GFR, Estimated: >60 mL/min (ref >60)

## 2023-07-05 LAB — TYPE AND SCREEN
ABO/RH(D): O POS
Antibody Screen: NEGATIVE

## 2023-07-05 LAB — CBC
HCT: 31.8 % — ABNORMAL LOW (ref 36.0–46.0)
Hemoglobin: 9.7 g/dL — ABNORMAL LOW (ref 12.0–15.0)
MCH: 25.5 pg — ABNORMAL LOW (ref 26.0–34.0)
MCHC: 30.5 g/dL (ref 30.0–36.0)
MCV: 83.7 fL (ref 80.0–100.0)
Platelets: 468 10*3/uL — ABNORMAL HIGH (ref 150–400)
RBC: 3.8 MIL/uL — ABNORMAL LOW (ref 3.87–5.11)
RDW: 14.1 % (ref 11.5–15.5)
WBC: 17 10*3/uL — ABNORMAL HIGH (ref 4.0–10.5)
nRBC: 0 % (ref 0.0–0.2)

## 2023-07-05 LAB — POCT PREGNANCY, URINE: Preg Test, Ur: NEGATIVE

## 2023-07-05 SURGERY — EXCISION, SMALL INTESTINE, LAPAROSCOPIC
Anesthesia: General | Site: Abdomen

## 2023-07-05 MED ORDER — PROCHLORPERAZINE MALEATE 10 MG PO TABS: 10.0000 mg | ORAL_TABLET | Freq: Four times a day (QID) | ORAL | Status: DC | PRN

## 2023-07-05 MED ORDER — PROCHLORPERAZINE EDISYLATE 10 MG/2ML IJ SOLN: 5.0000 mg | Freq: Four times a day (QID) | INTRAMUSCULAR | Status: DC | PRN

## 2023-07-05 MED ORDER — HYDROMORPHONE HCL 1 MG/ML IJ SOLN
INTRAMUSCULAR | Status: AC
  Filled 2023-07-05: qty 1

## 2023-07-05 MED ORDER — MEPERIDINE HCL 25 MG/ML IJ SOLN: 6.2500 mg | INTRAMUSCULAR | Status: DC | PRN

## 2023-07-05 MED ORDER — LIDOCAINE HCL (PF) 2 % IJ SOLN
INTRAMUSCULAR | Status: DC | PRN
  Administered 2023-07-05: 100 mg via INTRADERMAL

## 2023-07-05 MED ORDER — ACETAMINOPHEN 500 MG PO TABS
1000.0000 mg | ORAL_TABLET | Freq: Two times a day (BID) | ORAL | Status: DC | PRN
  Administered 2023-07-06 – 2023-07-13 (×3): 1000 mg via ORAL
  Filled 2023-07-05 (×3): qty 2

## 2023-07-05 MED ORDER — ONDANSETRON HCL 4 MG/2ML IJ SOLN
INTRAMUSCULAR | Status: DC | PRN
  Administered 2023-07-05: 4 mg via INTRAVENOUS

## 2023-07-05 MED ORDER — CHLORHEXIDINE GLUCONATE CLOTH 2 % EX PADS
6.0000 | MEDICATED_PAD | Freq: Once | CUTANEOUS | Status: DC
  Administered 2023-07-05: 6 via TOPICAL

## 2023-07-05 MED ORDER — ROCURONIUM BROMIDE 10 MG/ML (PF) SYRINGE
PREFILLED_SYRINGE | INTRAVENOUS | Status: AC
  Filled 2023-07-05: qty 10

## 2023-07-05 MED ORDER — ACETAMINOPHEN 500 MG PO TABS: 1000.0000 mg | ORAL_TABLET | Freq: Once | ORAL | Status: DC

## 2023-07-05 MED ORDER — LACTATED RINGERS IV SOLN: INTRAVENOUS | Status: DC

## 2023-07-05 MED ORDER — ACETAMINOPHEN 500 MG PO TABS
1000.0000 mg | ORAL_TABLET | ORAL | Status: AC
  Administered 2023-07-05: 1000 mg via ORAL
  Filled 2023-07-05: qty 2

## 2023-07-05 MED ORDER — SODIUM CHLORIDE 0.9 % IR SOLN
Status: DC | PRN
  Administered 2023-07-05: 1000 mL

## 2023-07-05 MED ORDER — HYDROMORPHONE HCL 1 MG/ML IJ SOLN
0.5000 mg | INTRAMUSCULAR | Status: DC | PRN
  Administered 2023-07-05 – 2023-07-16 (×39): 1 mg via INTRAVENOUS
  Filled 2023-07-05 (×40): qty 1

## 2023-07-05 MED ORDER — DEXAMETHASONE SODIUM PHOSPHATE 10 MG/ML IJ SOLN
INTRAMUSCULAR | Status: AC
  Filled 2023-07-05: qty 1

## 2023-07-05 MED ORDER — PROPOFOL 10 MG/ML IV BOLUS
INTRAVENOUS | Status: DC | PRN
  Administered 2023-07-05: 150 mg via INTRAVENOUS

## 2023-07-05 MED ORDER — OXYCODONE HCL 5 MG/5ML PO SOLN
5.0000 mg | Freq: Once | ORAL | Status: DC | PRN
Start: 2023-07-05 — End: 2023-07-05

## 2023-07-05 MED ORDER — DEXMEDETOMIDINE HCL IN NACL 80 MCG/20ML IV SOLN
INTRAVENOUS | Status: AC
  Filled 2023-07-05: qty 20

## 2023-07-05 MED ORDER — DEXAMETHASONE SODIUM PHOSPHATE 10 MG/ML IJ SOLN
INTRAMUSCULAR | Status: DC | PRN
  Administered 2023-07-05: 10 mg via INTRAVENOUS

## 2023-07-05 MED ORDER — PROPOFOL 10 MG/ML IV BOLUS
INTRAVENOUS | Status: AC
Start: 2023-07-05 — End: ?
  Filled 2023-07-05: qty 20

## 2023-07-05 MED ORDER — SODIUM CHLORIDE 0.9 % IV SOLN: Freq: Once | INTRAVENOUS | Status: AC

## 2023-07-05 MED ORDER — HYDROMORPHONE HCL 1 MG/ML IJ SOLN
INTRAMUSCULAR | Status: DC | PRN
  Administered 2023-07-05 (×2): .25 mg via INTRAVENOUS

## 2023-07-05 MED ORDER — KETOROLAC TROMETHAMINE 30 MG/ML IJ SOLN
30.0000 mg | Freq: Four times a day (QID) | INTRAMUSCULAR | Status: AC | PRN
  Administered 2023-07-10 – 2023-07-11 (×3): 30 mg via INTRAVENOUS
  Filled 2023-07-05 (×4): qty 1

## 2023-07-05 MED ORDER — LIDOCAINE HCL 1 % IJ SOLN
INTRAMUSCULAR | Status: DC | PRN
  Administered 2023-07-05: 10 mL via INTRAMUSCULAR

## 2023-07-05 MED ORDER — PHENYLEPHRINE 80 MCG/ML (10ML) SYRINGE FOR IV PUSH (FOR BLOOD PRESSURE SUPPORT)
PREFILLED_SYRINGE | INTRAVENOUS | Status: DC | PRN
  Administered 2023-07-05: 160 ug via INTRAVENOUS

## 2023-07-05 MED ORDER — BUPIVACAINE-EPINEPHRINE (PF) 0.25% -1:200000 IJ SOLN
INTRAMUSCULAR | Status: AC
  Filled 2023-07-05: qty 30

## 2023-07-05 MED ORDER — HYDROMORPHONE HCL 1 MG/ML IJ SOLN
INTRAMUSCULAR | Status: AC
  Filled 2023-07-05: qty 0.5

## 2023-07-05 MED ORDER — PHENYLEPHRINE HCL-NACL 20-0.9 MG/250ML-% IV SOLN
INTRAVENOUS | Status: DC | PRN
  Administered 2023-07-05: 50 ug/min via INTRAVENOUS

## 2023-07-05 MED ORDER — DEXMEDETOMIDINE HCL IN NACL 80 MCG/20ML IV SOLN
INTRAVENOUS | Status: DC | PRN
  Administered 2023-07-05 (×3): 4 ug via INTRAVENOUS

## 2023-07-05 MED ORDER — CHLORHEXIDINE GLUCONATE CLOTH 2 % EX PADS: 6.0000 | MEDICATED_PAD | Freq: Once | CUTANEOUS | Status: DC

## 2023-07-05 MED ORDER — ROCURONIUM BROMIDE 10 MG/ML (PF) SYRINGE
PREFILLED_SYRINGE | INTRAVENOUS | Status: DC | PRN
  Administered 2023-07-05: 70 mg via INTRAVENOUS
  Administered 2023-07-05: 10 mg via INTRAVENOUS

## 2023-07-05 MED ORDER — CHLORHEXIDINE GLUCONATE 0.12 % MT SOLN
15.0000 mL | Freq: Once | OROMUCOSAL | Status: AC
  Administered 2023-07-05: 15 mL via OROMUCOSAL
  Filled 2023-07-05: qty 15

## 2023-07-05 MED ORDER — LIDOCAINE HCL (PF) 1 % IJ SOLN
INTRAMUSCULAR | Status: AC
  Filled 2023-07-05: qty 30

## 2023-07-05 MED ORDER — MIDAZOLAM HCL 2 MG/2ML IJ SOLN
0.5000 mg | Freq: Once | INTRAMUSCULAR | Status: DC | PRN
Start: 2023-07-05 — End: 2023-07-05

## 2023-07-05 MED ORDER — KETAMINE HCL 50 MG/5ML IJ SOSY
PREFILLED_SYRINGE | INTRAMUSCULAR | Status: AC
  Filled 2023-07-05: qty 5

## 2023-07-05 MED ORDER — GLYCOPYRROLATE PF 0.2 MG/ML IJ SOSY
PREFILLED_SYRINGE | INTRAMUSCULAR | Status: DC | PRN
  Administered 2023-07-05: .2 mg via INTRAVENOUS

## 2023-07-05 MED ORDER — HYDROMORPHONE HCL 1 MG/ML IJ SOLN
0.2500 mg | INTRAMUSCULAR | Status: DC | PRN
  Administered 2023-07-05 (×4): 0.5 mg via INTRAVENOUS

## 2023-07-05 MED ORDER — METHOCARBAMOL 500 MG PO TABS
500.0000 mg | ORAL_TABLET | Freq: Four times a day (QID) | ORAL | Status: DC | PRN
  Administered 2023-07-05: 500 mg via ORAL
  Filled 2023-07-05: qty 1

## 2023-07-05 MED ORDER — SUGAMMADEX SODIUM 200 MG/2ML IV SOLN
INTRAVENOUS | Status: DC | PRN
  Administered 2023-07-05 (×2): 100 mg via INTRAVENOUS

## 2023-07-05 MED ORDER — OXYCODONE HCL 5 MG PO TABS
5.0000 mg | ORAL_TABLET | ORAL | Status: DC | PRN
  Administered 2023-07-05: 5 mg via ORAL
  Administered 2023-07-06 – 2023-07-10 (×15): 10 mg via ORAL
  Administered 2023-07-11: 5 mg via ORAL
  Administered 2023-07-12 – 2023-07-13 (×4): 10 mg via ORAL
  Administered 2023-07-14: 5 mg via ORAL
  Administered 2023-07-14 – 2023-07-16 (×9): 10 mg via ORAL
  Filled 2023-07-05 (×19): qty 2
  Filled 2023-07-05: qty 1
  Filled 2023-07-05 (×6): qty 2
  Filled 2023-07-05: qty 1
  Filled 2023-07-05 (×5): qty 2

## 2023-07-05 MED ORDER — ORAL CARE MOUTH RINSE: 15.0000 mL | Freq: Once | OROMUCOSAL | Status: AC

## 2023-07-05 MED ORDER — MIDAZOLAM HCL 2 MG/2ML IJ SOLN
INTRAMUSCULAR | Status: AC
Start: 2023-07-05 — End: ?
  Filled 2023-07-05: qty 2

## 2023-07-05 MED ORDER — MIDAZOLAM HCL 2 MG/2ML IJ SOLN
INTRAMUSCULAR | Status: DC | PRN
  Administered 2023-07-05: 2 mg via INTRAVENOUS

## 2023-07-05 MED ORDER — CEFAZOLIN SODIUM-DEXTROSE 2-4 GM/100ML-% IV SOLN
2.0000 g | Freq: Three times a day (TID) | INTRAVENOUS | Status: AC
  Administered 2023-07-05: 2 g via INTRAVENOUS
  Filled 2023-07-05: qty 100

## 2023-07-05 MED ORDER — KCL IN DEXTROSE-NACL 20-5-0.45 MEQ/L-%-% IV SOLN
INTRAVENOUS | Status: AC
  Filled 2023-07-05: qty 1000

## 2023-07-05 MED ORDER — ONDANSETRON 4 MG PO TBDP
4.0000 mg | ORAL_TABLET | Freq: Four times a day (QID) | ORAL | Status: DC | PRN
  Administered 2023-07-09 – 2023-07-16 (×6): 4 mg via ORAL
  Filled 2023-07-05 (×6): qty 1

## 2023-07-05 MED ORDER — MELATONIN 3 MG PO TABS
3.0000 mg | ORAL_TABLET | Freq: Every evening | ORAL | Status: DC | PRN
  Administered 2023-07-12 – 2023-07-15 (×4): 3 mg via ORAL
  Filled 2023-07-05 (×5): qty 1

## 2023-07-05 MED ORDER — ENOXAPARIN SODIUM 40 MG/0.4ML IJ SOSY
40.0000 mg | PREFILLED_SYRINGE | INTRAMUSCULAR | Status: DC
  Administered 2023-07-06 – 2023-07-16 (×11): 40 mg via SUBCUTANEOUS
  Filled 2023-07-05 (×11): qty 0.4

## 2023-07-05 MED ORDER — ONDANSETRON HCL 4 MG/2ML IJ SOLN
INTRAMUSCULAR | Status: AC
  Filled 2023-07-05: qty 2

## 2023-07-05 MED ORDER — 0.9 % SODIUM CHLORIDE (POUR BTL) OPTIME
TOPICAL | Status: DC | PRN
  Administered 2023-07-05: 1000 mL

## 2023-07-05 MED ORDER — PHENYLEPHRINE HCL-NACL 20-0.9 MG/250ML-% IV SOLN
INTRAVENOUS | Status: AC
  Filled 2023-07-05: qty 250

## 2023-07-05 MED ORDER — KETOROLAC TROMETHAMINE 30 MG/ML IJ SOLN
30.0000 mg | Freq: Four times a day (QID) | INTRAMUSCULAR | Status: AC
  Administered 2023-07-05 – 2023-07-06 (×4): 30 mg via INTRAVENOUS
  Filled 2023-07-05 (×4): qty 1

## 2023-07-05 MED ORDER — SCOPOLAMINE 1 MG/3DAYS TD PT72
1.0000 | MEDICATED_PATCH | TRANSDERMAL | Status: DC
  Administered 2023-07-05: 1.5 mg via TRANSDERMAL
  Filled 2023-07-05: qty 1

## 2023-07-05 MED ORDER — KETOROLAC TROMETHAMINE 30 MG/ML IJ SOLN
INTRAMUSCULAR | Status: DC | PRN
  Administered 2023-07-05: 30 mg via INTRAVENOUS

## 2023-07-05 MED ORDER — LIDOCAINE 2% (20 MG/ML) 5 ML SYRINGE
INTRAMUSCULAR | Status: AC
  Filled 2023-07-05: qty 5

## 2023-07-05 MED ORDER — DIPHENHYDRAMINE HCL 50 MG/ML IJ SOLN: 12.5000 mg | Freq: Four times a day (QID) | INTRAMUSCULAR | Status: DC | PRN

## 2023-07-05 MED ORDER — OXYCODONE HCL 5 MG PO TABS: 5.0000 mg | ORAL_TABLET | Freq: Once | ORAL | Status: DC | PRN

## 2023-07-05 MED ORDER — KETAMINE HCL 10 MG/ML IJ SOLN
INTRAMUSCULAR | Status: DC | PRN
  Administered 2023-07-05: 20 mg via INTRAVENOUS
  Administered 2023-07-05: 10 mg via INTRAVENOUS

## 2023-07-05 MED ORDER — HYDROMORPHONE HCL 1 MG/ML IJ SOLN
0.2500 mg | INTRAMUSCULAR | Status: DC | PRN
Start: 2023-07-05 — End: 2023-07-05
  Administered 2023-07-05 (×4): 0.5 mg via INTRAVENOUS

## 2023-07-05 MED ORDER — CEFAZOLIN SODIUM-DEXTROSE 2-4 GM/100ML-% IV SOLN
2.0000 g | INTRAVENOUS | Status: AC
  Administered 2023-07-05: 2 g via INTRAVENOUS
  Filled 2023-07-05: qty 100

## 2023-07-05 MED ORDER — ONDANSETRON HCL 4 MG/2ML IJ SOLN
4.0000 mg | Freq: Four times a day (QID) | INTRAMUSCULAR | Status: DC | PRN
  Administered 2023-07-05 – 2023-07-16 (×6): 4 mg via INTRAVENOUS
  Filled 2023-07-05 (×6): qty 2

## 2023-07-05 MED ORDER — DIPHENHYDRAMINE HCL 12.5 MG/5ML PO ELIX: 12.5000 mg | ORAL_SOLUTION | Freq: Four times a day (QID) | ORAL | Status: DC | PRN

## 2023-07-05 SURGICAL SUPPLY — 49 items
BAG COUNTER SPONGE SURGICOUNT (BAG) ×1 IMPLANT
BLADE CLIPPER SURG (BLADE) IMPLANT
CANISTER SUCT 3000ML PPV (MISCELLANEOUS) ×1 IMPLANT
COVER SURGICAL LIGHT HANDLE (MISCELLANEOUS) ×2 IMPLANT
DERMABOND ADVANCED .7 DNX12 (GAUZE/BANDAGES/DRESSINGS) IMPLANT
ELECT CAUTERY BLADE 6.4 (BLADE) ×2 IMPLANT
ELECT REM PT RETURN 9FT ADLT (ELECTROSURGICAL) ×1
ELECTRODE REM PT RTRN 9FT ADLT (ELECTROSURGICAL) ×1 IMPLANT
GLOVE BIO SURGEON STRL SZ 6 (GLOVE) ×2 IMPLANT
GLOVE INDICATOR 6.5 STRL GRN (GLOVE) ×2 IMPLANT
GOWN STRL REUS W/ TWL LRG LVL3 (GOWN DISPOSABLE) ×6 IMPLANT
GOWN STRL REUS W/ TWL XL LVL3 (GOWN DISPOSABLE) ×2 IMPLANT
HANDLE SUCTION POOLE (INSTRUMENTS) IMPLANT
IRRIG SUCT STRYKERFLOW 2 WTIP (MISCELLANEOUS) ×1
IRRIGATION SUCT STRKRFLW 2 WTP (MISCELLANEOUS) ×1 IMPLANT
KIT TURNOVER KIT B (KITS) ×1 IMPLANT
L-HOOK LAP DISP 36CM (ELECTROSURGICAL) ×1
LHOOK LAP DISP 36CM (ELECTROSURGICAL) ×1 IMPLANT
LIGASURE LAP MARYLAND 5MM 37CM (ELECTROSURGICAL) ×1 IMPLANT
NDL INSUFFLATION 14GA 120MM (NEEDLE) IMPLANT
NEEDLE INSUFFLATION 14GA 120MM (NEEDLE) ×1
NS IRRIG 1000ML POUR BTL (IV SOLUTION) ×2 IMPLANT
PAD ARMBOARD 7.5X6 YLW CONV (MISCELLANEOUS) ×1 IMPLANT
PENCIL BUTTON HOLSTER BLD 10FT (ELECTRODE) ×2 IMPLANT
RELOAD PROXIMATE 75MM BLUE (ENDOMECHANICALS) ×2
RELOAD STAPLE 75 3.8 BLU REG (ENDOMECHANICALS) IMPLANT
RTRCTR WOUND ALEXIS 18CM SML (INSTRUMENTS) ×1
SAVER CELL AAL HAEMONETICS (INSTRUMENTS) IMPLANT
SLEEVE Z-THREAD 5X100MM (TROCAR) ×1 IMPLANT
SPECIMEN JAR LARGE (MISCELLANEOUS) ×1 IMPLANT
STAPLER GUN LINEAR PROX 60 (STAPLE) IMPLANT
STAPLER PROXIMATE 75MM BLUE (STAPLE) IMPLANT
STRIP CLOSURE SKIN 1/2X4 (GAUZE/BANDAGES/DRESSINGS) IMPLANT
SUCTION POOLE HANDLE (INSTRUMENTS) ×1
SUT MNCRL AB 4-0 PS2 18 (SUTURE) IMPLANT
SUT PDS AB 1 CT 36 (SUTURE) IMPLANT
SUT VIC AB 2-0 SH 18 (SUTURE) ×1 IMPLANT
SUT VIC AB 3-0 SH 18 (SUTURE) ×1 IMPLANT
SUT VIC AB 3-0 SH 27X BRD (SUTURE) IMPLANT
SUT VICRYL AB 2 0 TIES (SUTURE) ×1 IMPLANT
SUT VICRYL AB 3 0 TIES (SUTURE) ×1 IMPLANT
TRAY FOLEY MTR SLVR 16FR STAT (SET/KITS/TRAYS/PACK) IMPLANT
TROCAR XCEL NON-BLD 5MMX100MML (ENDOMECHANICALS) IMPLANT
TROCAR Z-THREAD OPTICAL 5X100M (TROCAR) ×1 IMPLANT
TUBE CONNECTING 12X1/4 (SUCTIONS) ×2 IMPLANT
TUBING EVAC SMOKE HEATED PNEUM (TUBING) ×1 IMPLANT
WARMER LAPAROSCOPE (MISCELLANEOUS) ×1 IMPLANT
WATER STERILE IRR 1000ML POUR (IV SOLUTION) ×1 IMPLANT
YANKAUER SUCT BULB TIP NO VENT (SUCTIONS) IMPLANT

## 2023-07-05 NOTE — Anesthesia Procedure Notes (Signed)
Procedure Name: Intubation Date/Time: 07/05/2023 9:28 AM  Performed by: Susy Manor, CRNAPre-anesthesia Checklist: Patient identified, Emergency Drugs available, Suction available and Patient being monitored Patient Re-evaluated:Patient Re-evaluated prior to induction Oxygen Delivery Method: Circle System Utilized Preoxygenation: Pre-oxygenation with 100% oxygen Induction Type: IV induction Ventilation: Mask ventilation without difficulty Laryngoscope Size: Mac and 3 Grade View: Grade I Tube type: Oral Tube size: 7.5 mm Number of attempts: 1 Airway Equipment and Method: Stylet and Oral airway Placement Confirmation: ETT inserted through vocal cords under direct vision, positive ETCO2 and breath sounds checked- equal and bilateral Secured at: 21 cm Tube secured with: Tape Dental Injury: Teeth and Oropharynx as per pre-operative assessment

## 2023-07-05 NOTE — Progress Notes (Signed)
Called Dr Arita Miss office, got transferred to covering RN, message left to MD for swelling noted on pt's mid abdominal area right above umbilical cord, no c/o pain or redness, Dr Sheliah Hatch also called for suggestions, MD states its probably from an under stitch. Pt able to ambulate without pain to bathroom, able to urinate x2. Waiting on call back.

## 2023-07-05 NOTE — Op Note (Signed)
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  FINDINGS:  tattoo easily located proximal ileum. Some adenopathy in mesentery. Additional small nodule in small bowel around 8 cm from index mass, this was also resected  EBL: 50 mL  PROCEDURE:   Patient was identified in the holding area and taken the operating room where she was placed supine on operating room table.  Sequential compression devices were placed.  General anesthesia was induced.  Foley catheter was placed.  The abdomen was prepped and draped in sterile fashion.  A timeout was performed according to the surgical safety checklist.  When all was correct, we continued.  Patient was placed into reverse Trendelenburg position and rotated to the right.  Local anesthetic was administered at the left subcostal margin.  Originally a Optiview trocar was attempted but the patient had a lot of laxity in the abdominal wall due to recent childbirth.  A Veress needle was placed and used to insufflate the abdomen.  Then the Optiview trocar was then placed.  The left upper quadrant was examined carefully and there was no evidence of any injury to left upper quadrant contents.  Two additional trocars were placed on the left mid and left lower abdomen.  The patient was then placed into mild Trendelenburg position and rotated to the left.  The small bowel was run from distal to proximal twice.  The tattoo was located in the  proximal ileum.  No additional findings were appreciated laparoscopically.  The ovaries appeared relatively normal.  The gallbladder appeared normal.  There were no other gross positive findings on inspection.  A 3 cm incision was made just supraumbilically.  The subcutaneous tissues were divided with the cautery.  The fascia was entered sharply.  The fascial incision was carried out the length of the skin incision.  A wound protector was placed.  The small bowel at the location of the tattoo was eviscerated and the bowel examined.  There was some mesenteric adenopathy that had not been appreciated laparoscopically.  Appropriate site for resection was identified proximal and distal to the mass.  Just distal to the mass there was another 5 mm mass that felt like it was in the lumen of the bowel.  This was included in the specimen.  The small bowel was divided with the GIA 75 mm stapler.  The LigaSure was used to divide the mesentery taking a high ligation with removal of all appreciably enlarged lymph nodes.  There was no evidence of bleeding.  The small bowel mesentery was then closed using interrupted 2-0 Vicryl figure-of-eight sutures.  Care was taken to ensure that none of the vasculature was compromised.  A side-to-side, functional end-to-end stapled anastomosis was then performed.  The apex was reinforced with 3-0 Vicryl.  The small bowel was then placed back into the abdomen.  The fascia was closed using #1 PDS suture.  The abdomen was reinsufflated and the bowel was run again laparoscopically.  The bowel appeared to be without twisting.  This was run again from distal to proximal and proximal to distal to make sure there was no evidence of abnormal lie, twisting of the mesentery, or vascular compromise.  The omentum was then pulled down over the top of the small intestine.  The midline extraction port was airtight.  The 2 remaining trocars in the left lower abdomen were removed under direct  visualization.  The pneumoperitoneum was evacuated with the suction irrigator and the other trocar was removed.  The skin was then closed using interrupted 3-0 Vicryl deep dermal sutures and 4-0 Monocryl subcuticular suture at the extraction port and subcuticular suture only of 4-0 Monocryl at the port sites.  These were then cleaned, dried, dressed with Dermabond.    The patient was allowed to emerge from anesthesia and was taken the PACU in stable condition.  Needle, sponge, and instrument counts were correct x 2.

## 2023-07-05 NOTE — Interval H&P Note (Signed)
History and Physical Interval Note:  07/05/2023 9:04 AM  Nicole Mclean  has presented today for surgery, with the diagnosis of SMALL BOWEL NEUROENDOCRINE TUMOR.  The various methods of treatment have been discussed with the patient and family. After consideration of risks, benefits and other options for treatment, the patient has consented to  Procedure(s): LAPAROSCOPIC SMALL BOWEL RESECTION (N/A) as a surgical intervention.  The patient's history has been reviewed, patient examined, no change in status, stable for surgery.  I have reviewed the patient's chart and labs.  Questions were answered to the patient's satisfaction.     Almond Lint

## 2023-07-05 NOTE — Anesthesia Postprocedure Evaluation (Signed)
Anesthesia Post Note  Patient: Nicole Mclean  Procedure(s) Performed: LAPAROSCOPIC SMALL BOWEL RESECTION (Abdomen)     Patient location during evaluation: PACU Anesthesia Type: General Level of consciousness: awake and alert, patient cooperative and oriented Pain management: pain level controlled Vital Signs Assessment: post-procedure vital signs reviewed and stable Respiratory status: spontaneous breathing, nonlabored ventilation and respiratory function stable Cardiovascular status: blood pressure returned to baseline and stable Postop Assessment: no apparent nausea or vomiting Anesthetic complications: no   No notable events documented.  Last Vitals:  Vitals:   07/05/23 1315 07/05/23 1330  BP: 102/62 (!) 96/53  Pulse: 82 (!) 58  Resp: 18 17  Temp:    SpO2: 99% 98%    Last Pain:  Vitals:   07/05/23 1330  TempSrc:   PainSc: 4                  Amour Cutrone,E. Zakariye Nee

## 2023-07-05 NOTE — Transfer of Care (Signed)
Immediate Anesthesia Transfer of Care Note  Patient: [name]  Procedure(s) Performed: LAPAROSCOPIC SMALL BOWEL RESECTION (Abdomen)  Patient Location: PACU  Anesthesia Type:General  Level of Consciousness: sedated  Airway & Oxygen Therapy: Patient Spontanous Breathing and Patient connected to nasal cannula oxygen  Post-op Assessment: Report given to RN and Post -op Vital signs reviewed and stable  Post vital signs: Reviewed and stable  Last Vitals:  Vitals Value Taken Time  BP 116/70 07/05/23 1124  Temp    Pulse 69 07/05/23 1127  Resp 22 07/05/23 1127  SpO2 100 % 07/05/23 1127  Vitals shown include unfiled device data.  Last Pain:  Vitals:   07/05/23 0757  TempSrc:   PainSc: 0-No pain         Complications: No notable events documented.

## 2023-07-06 ENCOUNTER — Encounter (HOSPITAL_COMMUNITY): Payer: Self-pay | Admitting: General Surgery

## 2023-07-06 LAB — CBC
HCT: 27.1 % — ABNORMAL LOW (ref 36.0–46.0)
Hemoglobin: 8.5 g/dL — ABNORMAL LOW (ref 12.0–15.0)
MCH: 25.8 pg — ABNORMAL LOW (ref 26.0–34.0)
MCHC: 31.4 g/dL (ref 30.0–36.0)
MCV: 82.1 fL (ref 80.0–100.0)
Platelets: 391 10*3/uL (ref 150–400)
RBC: 3.3 MIL/uL — ABNORMAL LOW (ref 3.87–5.11)
RDW: 14.1 % (ref 11.5–15.5)
WBC: 13.9 10*3/uL — ABNORMAL HIGH (ref 4.0–10.5)
nRBC: 0 % (ref 0.0–0.2)

## 2023-07-06 LAB — BASIC METABOLIC PANEL
Anion gap: 7 (ref 5–15)
BUN: 7 mg/dL (ref 6–20)
CO2: 24 mmol/L (ref 22–32)
Calcium: 8.9 mg/dL (ref 8.9–10.3)
Chloride: 107 mmol/L (ref 98–111)
Creatinine, Ser: 0.67 mg/dL (ref 0.44–1.00)
GFR, Estimated: >60 mL/min (ref >60)
Glucose, Bld: 108 mg/dL — ABNORMAL HIGH (ref 70–99)
Potassium: 3.9 mmol/L (ref 3.5–5.1)
Sodium: 138 mmol/L (ref 135–145)

## 2023-07-06 LAB — PHOSPHORUS: Phosphorus: 3.3 mg/dL (ref 2.5–4.6)

## 2023-07-06 LAB — MAGNESIUM: Magnesium: 2 mg/dL (ref 1.7–2.4)

## 2023-07-06 MED ORDER — METHOCARBAMOL 1000 MG/10ML IJ SOLN
1000.0000 mg | Freq: Three times a day (TID) | INTRAMUSCULAR | Status: AC
  Administered 2023-07-06 – 2023-07-08 (×6): 1000 mg via INTRAVENOUS
  Filled 2023-07-06 (×6): qty 10

## 2023-07-06 NOTE — Progress Notes (Signed)
1 Day Post-Op   Subjective/Chief Complaint: Patient having a lot of pain last night and today.  No n/v.  No flatus.  Tolerated full liquids and is trying soft food this AM.  No fevers.    Had a lot of issues with breast pumping because she was given hand pump rather than electric pump.  She had issues holding on the pump with her abdominal pain from her incision.  Also, the pump included only tiny 2 oz bottles.     Objective: Vital signs in last 24 hours: Temp:  [97.7 F (36.5 C)-98.5 F (36.9 C)] 97.9 F (36.6 C) (12/19 0800) Pulse Rate:  [44-95] 69 (12/19 0800) Resp:  [14-24] 18 (12/19 0800) BP: (91-121)/(49-71) 107/60 (12/19 0800) SpO2:  [97 %-100 %] 98 % (12/19 0800) Last BM Date : 07/05/23  Intake/Output from previous day: 12/18 0701 - 12/19 0700 In: 2451.7 [P.O.:550; I.V.:1801.7; IV Piggyback:100] Out: 1150 [Urine:1150] Intake/Output this shift: Total I/O In: 220 [P.O.:220] Out: -   General appearance: alert, cooperative, and mild distress Resp: breathing comfortably GI: soft, approp tender. Mild bruising near umbilical incision.   Extremities: extremities normal, atraumatic, no cyanosis or edema  Lab Results:  Recent Labs    07/05/23 1623 07/06/23 0631  WBC 17.0* 13.9*  HGB 9.7* 8.5*  HCT 31.8* 27.1*  PLT 468* 391   BMET Recent Labs    07/05/23 1623 07/06/23 0631  NA  --  138  K  --  3.9  CL  --  107  CO2  --  24  GLUCOSE  --  108*  BUN  --  7  CREATININE 0.79 0.67  CALCIUM  --  8.9   PT/INR No results for input(s): "LABPROT", "INR" in the last 72 hours. ABG No results for input(s): "PHART", "HCO3" in the last 72 hours.  Invalid input(s): "PCO2", "PO2"  Studies/Results: No results found.  Anti-infectives: Anti-infectives (From admission, onward)    Start     Dose/Rate Route Frequency Ordered Stop   07/05/23 1730  ceFAZolin (ANCEF) IVPB 2g/100 mL premix        2 g 200 mL/hr over 30 Minutes Intravenous Every 8 hours 07/05/23 1438 07/05/23  1646   07/05/23 0800  ceFAZolin (ANCEF) IVPB 2g/100 mL premix        2 g 200 mL/hr over 30 Minutes Intravenous On call to O.R. 07/05/23 0745 07/05/23 0930       Assessment/Plan: s/p Procedure(s): LAPAROSCOPIC SMALL BOWEL RESECTION (N/A)  Try abdominal binder Clarified pain meds.  Ok to go straight to IV medication if pain score 7 or more.   Lactation consult to get electric pump and possible refrigeration for pumped milk.   Not ready for d/c due to pain.  Await return of bowel function  Lovenox for VTE ppx.    LOS: 1 day    Almond Lint 07/06/2023

## 2023-07-06 NOTE — Plan of Care (Signed)

## 2023-07-06 NOTE — Plan of Care (Signed)
Patient tolerated the liquid diet and was advanced to soft diet .Ambulated multiple times.

## 2023-07-06 NOTE — Progress Notes (Signed)
   07/06/23 1021  TOC Brief Assessment  Insurance and Status Reviewed  Patient has primary care physician Yes  Prior level of function: independent  Prior/Current Home Services No current home services  Social Drivers of Health Review SDOH reviewed no interventions necessary  Readmission risk has been reviewed Yes  Transition of care needs no transition of care needs at this time     Transition of Care Department [facility]) has reviewed patient and no TOC needs have been identified at this time. We will continue to monitor patient advancement through interdisciplinary progression rounds. If new patient transition needs arise, please place a TOC consult.

## 2023-07-06 NOTE — Lactation Note (Signed)
Lactation Consultation Note  Patient Name: Nicole Mclean [id number] Date: 07/06/2023 Age:27 y.o. Reason for consult: Follow-up assessment;Exclusive pumping and bottle feeding  Mother post surgery.  32 month old baby at home. Picked up approx. 160 ml of breastmilk, labeled breastmilk and put in refrigerator on [facility]. Mother stated due to hospitalization for two weeks she feels her supply has decreased.  Prior to hospitalization, mother states she was pumping 5-6 oz. per session. Encouraged her to pump regularly, including during the night to stimulate breasts are much as possible.   Praised her for her efforts.   Feeding Mother's Current Feeding Choice: Breast Milk Lactation Tools Discussed/Used Pumped volume: 160 mL  Interventions Interventions: DEBP;Education  Discharge Pump: Personal;DEBP  Consult Status Consult Status: PRN    Hardie Pulley  RN IBCLC 07/06/2023, 10:38 PM

## 2023-07-06 NOTE — Lactation Note (Addendum)
Lactation Consultation Note  Patient Name: Nicole Mclean [id number] Date: 07/06/2023 Age:27 y.o. Reason for consult: Initial assessment;Exclusive pumping and bottle feeding (admitted to 6N07, baby is at home and mom is exclusively pumping here in the hospital.) Dr. Donell Beers called this LC to make sure the [facility] consult is completed.  As LC entered the room, mom was pumping with the DEBP Symphony #21 F on high and she mentioned it was comfortable and was use to that strength. Per mom mentioned she has been mostly breast feeding for 4 months and pumping some with 5-6 oz at a time.  The last month has changed to make her volume decrease due to being in [place] long x 2 weeks , [facility] 1 week and back to [facility] for surgery. Mom mentioned she started out with the hand pump and now is started with the DEBP .  During [facility] consult mom pumped off 4 ozs .  Mom is aware of the proper storage times.  LC reviewed engorgement prevention and tx just in case mom experiences it , especially over night.  LC explored is mom could get a small refrigerator in he room while a patient and per RN assisting LC on 6 N - mentioned a small refrigerator  was not available.  LC obtained 2 breast milk cooler bags from NICU and for now will be housed in the 4 S breast milk refrigerator not being until family can pick it up.  Mom has been made aware where her milk will be stored until her family can pick it up.  House Coverage aware.   Maternal Data    Feeding Mother's Current Feeding Choice: Breast Milk    Lactation Tools Discussed/Used Tools: Pump;Flanges (mom was set up with the DEBP and pumping when LC entered the room with good results.) Flange Size: 21;24;Other (comment) (per mom comfortable with pumping and #21 F) Breast pump type: Double-Electric Breast Pump Pump Education: Setup, frequency, and cleaning;Milk Storage Pumping frequency: 6 N (1st time pumping with the DEBP since she was admitted to) Pumped volume: 120  mL  Interventions    Discharge Discharge Education: Engorgement and breast care Pump: Personal;DEBP  Consult Status Consult Status: Follow-up Date: 07/07/23 Follow-up type: In-patient    Nicole Mclean 07/06/2023, 5:35 PM

## 2023-07-07 MED ORDER — ALUM & MAG HYDROXIDE-SIMETH 200-200-20 MG/5ML PO SUSP
30.0000 mL | Freq: Four times a day (QID) | ORAL | Status: DC | PRN
  Administered 2023-07-08 – 2023-07-15 (×4): 30 mL via ORAL
  Filled 2023-07-07 (×4): qty 30

## 2023-07-07 MED ORDER — BOOST / RESOURCE BREEZE PO LIQD CUSTOM
1.0000 | Freq: Three times a day (TID) | ORAL | Status: DC
  Administered 2023-07-07 – 2023-07-09 (×3): 1 via ORAL

## 2023-07-07 MED ORDER — KCL IN DEXTROSE-NACL 20-5-0.45 MEQ/L-%-% IV SOLN
INTRAVENOUS | Status: AC
Start: 2023-07-07 — End: 2023-07-08
  Filled 2023-07-07 (×2): qty 1000

## 2023-07-07 MED ORDER — ENSURE ENLIVE PO LIQD
237.0000 mL | Freq: Two times a day (BID) | ORAL | Status: DC
  Administered 2023-07-07 – 2023-07-16 (×19): 237 mL via ORAL

## 2023-07-07 NOTE — Lactation Note (Signed)
Lactation Consultation Note  Patient Name: SHARNELLE AMADEO [id number] Date: 07/07/2023 Age:27 y.o. Reason for consult: Follow-up assessment   Maternal Data   Consult time: 1130-1153 LC presented to unit to collect expressed milk as instructed. Patient informed LC that she desired for the stored EBM to be delivered to her for her mother to transport home. LC apologized that the message was not relayed to lactation from RN.   LC left and returned to 4S to collect stored EBM. Returned and placed stored EBM in cooler bag for support person's transport home. Discussed to leave the blue cooler in the room to place fresh EBM in for lactation to pick up when ready. Patient verbalized understanding.   Feeding Mother's Current Feeding Choice: Breast Milk  LATCH Score   Patient verbalized just completed a successful breastfeeding session with baby    Lactation Tools Discussed/Used Reason for Pumping:  (maternal readmission, infant separation)  Interventions    Discharge Pump: Personal;DEBP  Consult Status Consult Status: PRN    T'Errah N Destynie Toomey 07/07/2023, 11:59 AM

## 2023-07-07 NOTE — Lactation Note (Signed)
Lactation Consultation Note  Patient Name: Nicole Mclean'X Date: 07/07/2023 Age:27 y.o.   LC called 6N RN to discuss patient's need for lactation. Patient has breast milk to be collected. This LC discussed that lactation will arrive shortly to pick up milk and transfer it to 4S breast milk storage room. RN also stated patient will not be discharging until possibly Monday.    Lactation to follow up PRN.  T'Errah N Milica Gully 07/07/2023, 10:45 AM

## 2023-07-07 NOTE — Progress Notes (Signed)
2 Days Post-Op   Subjective/Chief Complaint: Patient still having a lot of pain.  Tolerating po, but mostly sticking with liquids. No flatus yet.  No n/v.  Abdominal binder helped with pain.    Objective: Vital signs in last 24 hours: Temp:  [97.7 F (36.5 C)-98.2 F (36.8 C)] 97.9 F (36.6 C) (12/20 0804) Pulse Rate:  [47-72] 72 (12/20 0804) Resp:  [16-18] 17 (12/20 0804) BP: (102-115)/(52-68) 115/67 (12/20 0804) SpO2:  [94 %-100 %] 98 % (12/20 0804) Last BM Date : 07/05/23  Intake/Output from previous day: 12/19 0701 - 12/20 0700 In: 580 [P.O.:580] Out: 325 [Urine:325] Intake/Output this shift: No intake/output data recorded.  General appearance: alert, cooperative, and looks uncomfortable.  Resp: breathing comfortably GI: soft, approp tender. Mild bruising near umbilical incision.   Extremities: extremities normal, atraumatic, no cyanosis or edema  Lab Results:  Recent Labs    07/05/23 1623 07/06/23 0631  WBC 17.0* 13.9*  HGB 9.7* 8.5*  HCT 31.8* 27.1*  PLT 468* 391   BMET Recent Labs    07/05/23 1623 07/06/23 0631  NA  --  138  K  --  3.9  CL  --  107  CO2  --  24  GLUCOSE  --  108*  BUN  --  7  CREATININE 0.79 0.67  CALCIUM  --  8.9   PT/INR No results for input(s): "LABPROT", "INR" in the last 72 hours. ABG No results for input(s): "PHART", "HCO3" in the last 72 hours.  Invalid input(s): "PCO2", "PO2"  Studies/Results: No results found.  Anti-infectives: Anti-infectives (From admission, onward)    Start     Dose/Rate Route Frequency Ordered Stop   07/05/23 1730  ceFAZolin (ANCEF) IVPB 2g/100 mL premix        2 g 200 mL/hr over 30 Minutes Intravenous Every 8 hours 07/05/23 1438 07/05/23 1646   07/05/23 0800  ceFAZolin (ANCEF) IVPB 2g/100 mL premix        2 g 200 mL/hr over 30 Minutes Intravenous On call to O.R. 07/05/23 0745 07/05/23 0930       Assessment/Plan: s/p Procedure(s): LAPAROSCOPIC SMALL BOWEL RESECTION (N/A)   Not  ready for d/c due to pain.  Await return of bowel function Continue standing robaxin and toradol.  Prn dilaudid and prn oxycodone.   Ensure and resource breeze added for better protein intake with mostly liquid diet.   Lovenox for VTE ppx.   Lactation consult in place for assistance with milk expression and storage.     LOS: 2 days    Nicole Mclean 07/07/2023

## 2023-07-07 NOTE — Plan of Care (Signed)

## 2023-07-08 LAB — BASIC METABOLIC PANEL
Anion gap: 5 (ref 5–15)
BUN: 11 mg/dL (ref 6–20)
CO2: 27 mmol/L (ref 22–32)
Calcium: 8.6 mg/dL — ABNORMAL LOW (ref 8.9–10.3)
Chloride: 106 mmol/L (ref 98–111)
Creatinine, Ser: 0.83 mg/dL (ref 0.44–1.00)
GFR, Estimated: >60 mL/min (ref >60)
Glucose, Bld: 101 mg/dL — ABNORMAL HIGH (ref 70–99)
Potassium: 3.8 mmol/L (ref 3.5–5.1)
Sodium: 138 mmol/L (ref 135–145)

## 2023-07-08 LAB — CBC
HCT: 25.1 % — ABNORMAL LOW (ref 36.0–46.0)
Hemoglobin: 7.7 g/dL — ABNORMAL LOW (ref 12.0–15.0)
MCH: 25.7 pg — ABNORMAL LOW (ref 26.0–34.0)
MCHC: 30.7 g/dL (ref 30.0–36.0)
MCV: 83.7 fL (ref 80.0–100.0)
Platelets: 318 10*3/uL (ref 150–400)
RBC: 3 MIL/uL — ABNORMAL LOW (ref 3.87–5.11)
RDW: 14.2 % (ref 11.5–15.5)
WBC: 7.6 10*3/uL (ref 4.0–10.5)
nRBC: 0 % (ref 0.0–0.2)

## 2023-07-08 NOTE — Lactation Note (Signed)
Lactation Consultation Note  Patient Name: Nicole Mclean Date: 07/08/2023 Age:27 y.o.   P1- [name] called for LC to collect her EBM in 6N07 and bring it to the 4th floor nursery fridge. LC collected [name]'s milk and made sure she was doing okay. [name] reports no painful nipples at this time. LC encouraged her to call as needed.   Dema Severin BS, IBCLC 07/08/2023, 9:37 PM

## 2023-07-08 NOTE — Plan of Care (Signed)

## 2023-07-08 NOTE — Progress Notes (Signed)
3 Days Post-Op   Subjective/Chief Complaint: Still has abdominal pain.  No bowel function.  No nausea or vomiting.   Objective: Vital signs in last 24 hours: Temp:  [98.3 F (36.8 C)-98.4 F (36.9 C)] 98.4 F (36.9 C) (12/21 0818) Pulse Rate:  [68-83] 69 (12/21 0818) Resp:  [15-18] 15 (12/21 0818) BP: (109-123)/(53-73) 109/53 (12/21 0818) SpO2:  [97 %-100 %] 97 % (12/21 0818) Last BM Date : 07/05/23  Intake/Output from previous day: 12/20 0701 - 12/21 0700 In: 1974.8 [P.O.:720; I.V.:1254.8] Out: -  Intake/Output this shift: No intake/output data recorded.  General appearance: alert and cooperative Resp: clear to auscultation bilaterally Cardio: NSR Incision/Wound: Clean dry intact.  Appropriate tenderness throughout.  No peritonitis.  Lab Results:  Recent Labs    07/06/23 0631 07/08/23 0556  WBC 13.9* 7.6  HGB 8.5* 7.7*  HCT 27.1* 25.1*  PLT 391 318   BMET Recent Labs    07/06/23 0631 07/08/23 0556  NA 138 138  K 3.9 3.8  CL 107 106  CO2 24 27  GLUCOSE 108* 101*  BUN 7 11  CREATININE 0.67 0.83  CALCIUM 8.9 8.6*   PT/INR No results for input(s): "LABPROT", "INR" in the last 72 hours. ABG No results for input(s): "PHART", "HCO3" in the last 72 hours.  Invalid input(s): "PCO2", "PO2"  Studies/Results: No results found.  Anti-infectives: Anti-infectives (From admission, onward)    Start     Dose/Rate Route Frequency Ordered Stop   07/05/23 1730  ceFAZolin (ANCEF) IVPB 2g/100 mL premix        2 g 200 mL/hr over 30 Minutes Intravenous Every 8 hours 07/05/23 1438 07/05/23 1646   07/05/23 0800  ceFAZolin (ANCEF) IVPB 2g/100 mL premix        2 g 200 mL/hr over 30 Minutes Intravenous On call to O.R. 07/05/23 0745 07/05/23 0930       Assessment/Plan: s/p Procedure(s): LAPAROSCOPIC SMALL BOWEL RESECTION (N/A)   Await return of bowel function  Pain control still an issue  Encourage ambulation with binder  Continue to encourage p.o.  intake      LOS: 3 days    Dortha Schwalbe MD 07/08/2023

## 2023-07-08 NOTE — Lactation Note (Signed)
Lactation Consultation Note  Patient Name: Nicole Mclean Date: 07/08/2023 Age:27 y.o.  Mother of 75 month old post surgery.  Picked up breatsmilk and stored in breastmilk refrigerator in 4th floor nursery [facility].  Family may come on Sunday to pick up breastmilk.     Dahlia Byes Boschen 07/08/2023, 1:55 PM

## 2023-07-09 LAB — CBC
HCT: 26.1 % — ABNORMAL LOW (ref 36.0–46.0)
Hemoglobin: 8.1 g/dL — ABNORMAL LOW (ref 12.0–15.0)
MCH: 25.5 pg — ABNORMAL LOW (ref 26.0–34.0)
MCHC: 31 g/dL (ref 30.0–36.0)
MCV: 82.1 fL (ref 80.0–100.0)
Platelets: 365 10*3/uL (ref 150–400)
RBC: 3.18 MIL/uL — ABNORMAL LOW (ref 3.87–5.11)
RDW: 14 % (ref 11.5–15.5)
WBC: 7.8 10*3/uL (ref 4.0–10.5)
nRBC: 0 % (ref 0.0–0.2)

## 2023-07-09 MED ORDER — POLYETHYLENE GLYCOL 3350 17 G PO PACK
17.0000 g | PACK | Freq: Two times a day (BID) | ORAL | Status: DC
  Administered 2023-07-09 – 2023-07-16 (×14): 17 g via ORAL
  Filled 2023-07-09 (×14): qty 1

## 2023-07-09 NOTE — Progress Notes (Signed)
4 Days Post-Op   Subjective/Chief Complaint: Abdominal pain still an issue.  Patient with flatus.  No nausea or vomiting.   Objective: Vital signs in last 24 hours: Temp:  [98.1 F (36.7 C)-98.6 F (37 C)] 98.1 F (36.7 C) (12/22 0802) Pulse Rate:  [73-86] 73 (12/22 0802) Resp:  [17-18] 17 (12/22 0802) BP: (93-119)/(53-71) 93/54 (12/22 0802) SpO2:  [96 %-100 %] 96 % (12/22 0802) Last BM Date : 07/05/23  Intake/Output from previous day: 12/21 0701 - 12/22 0700 In: 480 [P.O.:480] Out: -  Intake/Output this shift: No intake/output data recorded.    Lab Results:  General appearance: alert and cooperative Resp: clear to auscultation bilaterally Cardio: NSR Incision/Wound: Clean dry intact.  Appropriate tenderness throughout.  No peritonitis.  BMET Recent Labs    07/08/23 0556  NA 138  K 3.8  CL 106  CO2 27  GLUCOSE 101*  BUN 11  CREATININE 0.83  CALCIUM 8.6*   PT/INR No results for input(s): "LABPROT", "INR" in the last 72 hours. ABG No results for input(s): "PHART", "HCO3" in the last 72 hours.  Invalid input(s): "PCO2", "PO2"  Studies/Results: No results found.  Anti-infectives: Anti-infectives (From admission, onward)    Start     Dose/Rate Route Frequency Ordered Stop   07/05/23 1730  ceFAZolin (ANCEF) IVPB 2g/100 mL premix        2 g 200 mL/hr over 30 Minutes Intravenous Every 8 hours 07/05/23 1438 07/05/23 1646   07/05/23 0800  ceFAZolin (ANCEF) IVPB 2g/100 mL premix        2 g 200 mL/hr over 30 Minutes Intravenous On call to O.R. 07/05/23 0745 07/05/23 0930       Assessment/Plan: s/p Procedure(s): LAPAROSCOPIC SMALL BOWEL RESECTION (N/A) Await return of bowel function -add MiraLAX   Pain control still an issue   Encourage ambulation with binder   Continue to encourage p.o. intake  LOS: 4 days    Maisie Fus A Jozlin Bently md  07/09/2023

## 2023-07-09 NOTE — Lactation Note (Signed)
Lactation Consultation Note  Patient Name: Nicole Mclean Date: 07/09/2023 Age:27 y.o.   P1- [name] left a voice message on the [facility] phone requesting for LC to collect her EBM from her room and bring it to the 4th floor nursery fridge. When [facility] entered the room and asked for the EBM, [name] stated that she asked for lactation to bring her milk from the 4th floor to her room for her mom to give to her child. LC informed [name] that this is not what her message said and apologized for the miscommunication. [name] then informed LC that she asked her RN to call to tell the lactation team that she needs her EBM brought to her room. [name] had just taken her pain medication at this time and apologized for stumbling on her words. LC encouraged [name] to rest and LC would talk with her RN.  When [facility] informed RN of situation, RN was not sure what [name] was talking about. LC requested that the team there assist [name] with communicating with the lactation team because this is the second time there was miscommunication. RN verbalized agreement and asked LC about the potential risks of [name]'s pain medication frequency and how it may affect the infant. LC reviewed what [facility] says and encouraged the RN to have the physician reach out to the lactation team to discuss the medications.   Dema Severin 07/09/2023, 6:00 PM

## 2023-07-10 LAB — CBC
HCT: 25.8 % — ABNORMAL LOW (ref 36.0–46.0)
Hemoglobin: 7.9 g/dL — ABNORMAL LOW (ref 12.0–15.0)
MCH: 25.6 pg — ABNORMAL LOW (ref 26.0–34.0)
MCHC: 30.6 g/dL (ref 30.0–36.0)
MCV: 83.5 fL (ref 80.0–100.0)
Platelets: 366 10*3/uL (ref 150–400)
RBC: 3.09 MIL/uL — ABNORMAL LOW (ref 3.87–5.11)
RDW: 14 % (ref 11.5–15.5)
WBC: 8.6 10*3/uL (ref 4.0–10.5)
nRBC: 0 % (ref 0.0–0.2)

## 2023-07-10 MED ORDER — METHOCARBAMOL 500 MG PO TABS
500.0000 mg | ORAL_TABLET | Freq: Three times a day (TID) | ORAL | Status: DC | PRN
  Administered 2023-07-10 – 2023-07-16 (×7): 500 mg via ORAL
  Filled 2023-07-10 (×7): qty 1

## 2023-07-10 MED ORDER — PANTOPRAZOLE SODIUM 20 MG PO TBEC
20.0000 mg | DELAYED_RELEASE_TABLET | Freq: Two times a day (BID) | ORAL | Status: DC
Start: 2023-07-10 — End: 2023-07-16
  Administered 2023-07-10 – 2023-07-16 (×13): 20 mg via ORAL
  Filled 2023-07-10 (×13): qty 1

## 2023-07-10 NOTE — Lactation Note (Signed)
Lactation Consultation Note  Patient Name: Nicole Mclean Date: 07/10/2023 Age:27 y.o. Reason for consult: Follow-up assessment;Mother's request;Exclusive pumping and bottle feeding (mom called and left a message on the answering machine she had  one bottle of milk. mom mentioned she had pumped x 2 today. LC encouraged to increase  her pumping and consistence now that she is feeling better this evening.) Reminded patient about power pumping once a day.   Maternal Data    Feeding Mother's Current Feeding Choice: Breast Milk   Lactation Tools Discussed/Used  DEBP   Interventions  Education   Discharge Pump: Personal;DEBP  Consult Status Consult Status: PRN Date: 07/11/23 Follow-up type: In-patient    [name] Jessa Stinson 07/10/2023, 6:31 PM

## 2023-07-10 NOTE — Progress Notes (Signed)
Assessment & Plan: POD#5 - status post small bowel resection for neuroendocrine tumor - 07/05/2023 Dr. Donell Beers  Tolerating liquids, some nausea and emesis - Rx Zofran  Miralax - no BM yet  Pain control  Path pending  Encouraged OOB, ambulation in halls.  Palpable "mass" in midline above incision - likely induration and post op changes but cannot rule out dehiscence/hernia - will plan CT scan if no improvement next 24-48 hours.  Wearing binder when ambulating.        Darnell Level, MD [facility] practice Office: [phone]        Chief Complaint: Neuroendocrine tumor of small intestine  Subjective: Patient in bed, taking some liquids.  Complains of swelling and pain just above umbilicus.  Objective: Vital signs in last 24 hours: Temp:  [98.1 F (36.7 C)-98.6 F (37 C)] 98.4 F (36.9 C) (12/23 0433) Pulse Rate:  [80-98] 98 (12/23 0834) Resp:  [18-19] 19 (12/23 0834) BP: (100-125)/(63-67) 113/67 (12/23 0834) SpO2:  [97 %-99 %] 98 % (12/23 0834) Last BM Date : 07/05/23  Intake/Output from previous day: 12/22 0701 - 12/23 0700 In: 240 [P.O.:240] Out: -  Intake/Output this shift: No intake/output data recorded.  Physical Exam: HEENT - sclerae clear, mucous membranes moist Neck - soft Abdomen - incisions clear and dry and intact; mild induration and tenderness above umbilicus; does not augment with valsalva Ext - no edema, non-tender  Lab Results:  Recent Labs    07/09/23 0627 07/10/23 0425  WBC 7.8 8.6  HGB 8.1* 7.9*  HCT 26.1* 25.8*  PLT 365 366   BMET Recent Labs    07/08/23 0556  NA 138  K 3.8  CL 106  CO2 27  GLUCOSE 101*  BUN 11  CREATININE 0.83  CALCIUM 8.6*   PT/INR No results for input(s): "LABPROT", "INR" in the last 72 hours. Comprehensive Metabolic Panel:    Component Value Date/Time   NA 138 07/08/2023 0556   NA 138 07/06/2023 0631   NA 138 01/10/2023 0943   NA 137 12/28/2022 1052   K 3.8 07/08/2023  0556   K 3.9 07/06/2023 0631   CL 106 07/08/2023 0556   CL 107 07/06/2023 0631   CO2 27 07/08/2023 0556   CO2 24 07/06/2023 0631   BUN 11 07/08/2023 0556   BUN 7 07/06/2023 0631   BUN 8 01/10/2023 0943   BUN 5 (L) 12/28/2022 1052   CREATININE 0.83 07/08/2023 0556   CREATININE 0.67 07/06/2023 0631   GLUCOSE 101 (H) 07/08/2023 0556   GLUCOSE 108 (H) 07/06/2023 0631   CALCIUM 8.6 (L) 07/08/2023 0556   CALCIUM 8.9 07/06/2023 0631   AST 18 05/18/2023 0022   AST 13 (L) 05/06/2023 1013   ALT 26 05/18/2023 0022   ALT 17 05/06/2023 1013   ALKPHOS 102 05/18/2023 0022   ALKPHOS 77 05/06/2023 1013   BILITOT 0.4 05/18/2023 0022   BILITOT 0.5 05/06/2023 1013   BILITOT <0.2 01/10/2023 0943   BILITOT <0.2 12/28/2022 1052   PROT 7.3 05/18/2023 0022   PROT 6.1 (L) 05/06/2023 1013   PROT 5.4 (L) 01/10/2023 0943   PROT 5.9 (L) 12/28/2022 1052   ALBUMIN 4.0 05/18/2023 0022   ALBUMIN 3.2 (L) 05/06/2023 1013   ALBUMIN 3.3 (L) 01/10/2023 0943   ALBUMIN 3.4 (L) 12/28/2022 1052    Studies/Results: No results found.    Darnell Level 07/10/2023  Patient ID: Nicole Mclean, female   DOB: [birth year], 27 y.o.  MRN: [id number]

## 2023-07-10 NOTE — Lactation Note (Signed)
Lactation Consultation Note  Patient Name: Nicole Mclean UEAVW'U Date: 07/10/2023 Age:27 y.o. Reason for consult: Follow-up assessment (Message left- answering machine for milk to be picked up.LC picked up the milk on ice and brought it to 4s nursery refrigerator and placed in the storage insulated bag. Mom aware to call if she is D/C today so the stored milk can be brought to her on 6N)     Lactation Tools Discussed/Used Pumping frequency: per mom has been pumping every 3-4 hours and expressed concern the volume had decreased. LC reminded up to work in pumping every 3 hours and not stretch to 4 hours. Power pump x 1 a day - 20 mins on 10 mins off over 60 mins( which can enhance the volume)  Interventions    Discharge Pump: Personal;DEBP  Consult Status Consult Status: PRN Date: 07/11/23 Follow-up type: In-patient    [name] Antavion Bartoszek 07/10/2023, 8:54 AM

## 2023-07-10 NOTE — Plan of Care (Signed)

## 2023-07-11 ENCOUNTER — Other Ambulatory Visit (HOSPITAL_COMMUNITY): Payer: Self-pay

## 2023-07-11 LAB — CBC
HCT: 24.9 % — ABNORMAL LOW (ref 36.0–46.0)
HCT: 25.2 % — ABNORMAL LOW (ref 36.0–46.0)
Hemoglobin: 7.6 g/dL — ABNORMAL LOW (ref 12.0–15.0)
Hemoglobin: 7.9 g/dL — ABNORMAL LOW (ref 12.0–15.0)
MCH: 25.4 pg — ABNORMAL LOW (ref 26.0–34.0)
MCH: 25.6 pg — ABNORMAL LOW (ref 26.0–34.0)
MCHC: 30.5 g/dL (ref 30.0–36.0)
MCHC: 31.3 g/dL (ref 30.0–36.0)
MCV: 83.3 fL (ref 80.0–100.0)
MCV: 81.6 fL (ref 80.0–100.0)
Platelets: 353 10*3/uL (ref 150–400)
Platelets: 353 10*3/uL (ref 150–400)
RBC: 2.99 MIL/uL — ABNORMAL LOW (ref 3.87–5.11)
RBC: 3.09 MIL/uL — ABNORMAL LOW (ref 3.87–5.11)
RDW: 14 % (ref 11.5–15.5)
RDW: 14.1 % (ref 11.5–15.5)
WBC: 6.6 10*3/uL (ref 4.0–10.5)
WBC: 6.4 10*3/uL (ref 4.0–10.5)
nRBC: 0 % (ref 0.0–0.2)
nRBC: 0.3 % — ABNORMAL HIGH (ref 0.0–0.2)

## 2023-07-11 LAB — SURGICAL PATHOLOGY

## 2023-07-11 LAB — GLUCOSE, CAPILLARY: Glucose-Capillary: 103 mg/dL — ABNORMAL HIGH (ref 70–99)

## 2023-07-11 MED ORDER — KETOROLAC TROMETHAMINE 10 MG PO TABS
10.0000 mg | ORAL_TABLET | Freq: Four times a day (QID) | ORAL | 0 refills | Status: DC | PRN
  Filled 2023-07-11: qty 12, 3d supply, fill #0

## 2023-07-11 MED ORDER — OXYCODONE HCL 5 MG PO TABS: 5.0000 mg | ORAL_TABLET | Freq: Four times a day (QID) | ORAL | 0 refills | Status: DC | PRN

## 2023-07-11 NOTE — Discharge Summary (Addendum)
Physician Discharge Summary   Patient ID: Nicole Mclean MRN: [id number] DOB/AGE: 01-07-1996 27 y.o.  Admit date: 07/05/2023  Discharge date: 07/16/2023  Discharge Diagnoses:  Principal Problem:   Primary malignant neuroendocrine neoplasm of ileum [facility])   Discharged Condition: good  Hospital Course: Patient was admitted following laparoscopic small bowel resection for neuroendocrine tumor on 07/05/23.  Post op course was complicated by post op ileus and pain. Her ileus resolved and diet was advanced. She had return of bowel function and pain control improved. She became dizzy on 12/24 with near syncope. Hgb was stable. She was given albumin boluses and dizziness improved over the next few days with hydration. On the morning of 12/29, she was tolerating a regular diet, ambulating independently without dizziness, and pain was well-controlled. She was examined and deemed appropriate for discharge home.  Consults: None  Treatments: surgery: laparoscopic small bowel resection for neuroendocrine tumor  Discharge Exam: Blood pressure 101/67, pulse 74, temperature 98.2 F (36.8 C), temperature source Oral, resp. rate 17, height 5\' 1"  (1.549 m), weight 114.4 kg, last menstrual period 05/06/2022, SpO2 97%, currently breastfeeding. General: resting comfortably, NAD Neuro: alert and oriented Resp: normal work of breathing on room air Abdomen: soft and nondistended, incisions are clean and dry with no erythema or induration.  Disposition: Home  Discharge Instructions     Call MD for:  persistant nausea and vomiting   Complete by: As directed    Call MD for:  redness, tenderness, or signs of infection (pain, swelling, redness, odor or green/yellow discharge around incision site)   Complete by: As directed    Call MD for:  severe uncontrolled pain   Complete by: As directed    Call MD for:  temperature >100.4   Complete by: As directed    Diet - low sodium heart healthy   Complete by:  As directed    Diet general   Complete by: As directed    Increase activity slowly   Complete by: As directed    Increase activity slowly   Complete by: As directed    No dressing needed   Complete by: As directed       Allergies as of 07/16/2023   No Known Allergies      Medication List     TAKE these medications    acetaminophen 500 MG tablet Commonly known as: TYLENOL Take 1,000 mg by mouth 2 (two) times daily as needed for moderate pain (pain score 4-6) or mild pain (pain score 1-3) (Stomach pain).   ketorolac 10 MG tablet Commonly known as: TORADOL Take 1 tablet (10 mg total) by mouth every 6 (six) hours as needed for moderate pain (pain score 4-6).   methocarbamol 500 MG tablet Commonly known as: ROBAXIN Take 1 tablet (500 mg total) by mouth every 8 (eight) hours as needed for muscle spasms.   ondansetron 4 MG disintegrating tablet Commonly known as: ZOFRAN-ODT Take 1 tablet (4 mg total) by mouth every 6 (six) hours as needed for nausea.   oxyCODONE 5 MG immediate release tablet Commonly known as: Oxy IR/ROXICODONE Take 1 tablet (5 mg total) by mouth every 6 (six) hours as needed for moderate pain (pain score 4-6).               Discharge Care Instructions  (From admission, onward)           Start     Ordered   07/11/23 0000  No dressing needed  07/11/23 1013            Follow-up Information     Almond Lint, MD. Schedule an appointment as soon as possible for a visit in 2 week(s).   Specialty: General Surgery Why: For wound re-check Contact information: [address] [place] [address] [phone]                  Signed: Fritzi Mandes 07/16/2023, 11:08 AM

## 2023-07-11 NOTE — Progress Notes (Signed)
RN spoke with Dr Cliffton Asters and he stated that he did not round on pt today it was Dr. Gerrit Friends and that pt Hgb has been the same for the past couple of days with no issues. RN let him know that pt is now stating she is feels light headed and her vision gets blurry. MD states that we can cancel the DC if she does not feel safe going home and recheck labs in the AM. Mikalia Fessel,RN will notify Kae,RN.

## 2023-07-11 NOTE — Discharge Instructions (Signed)
[facility]  LAPAROSCOPIC SURGERY:  POST-OP INSTRUCTIONS  Always review your discharge instruction sheet given to you by the facility where your surgery was performed.  A prescription for pain medication may be given to you upon discharge.  Take your pain medication as prescribed.  If narcotic pain medicine is not needed, then you may take acetaminophen (Tylenol) or ibuprofen (Advil) as needed.  Take your usually prescribed medications unless otherwise directed.  If you need a refill on your pain medication, please contact your pharmacy.  They will contact our office to request authorization. Prescriptions will not be filled after 5 P.M. or on weekends.  You should follow a light diet the first few days after arrival home, such as soup and crackers or toast.  Be sure to include plenty of fluids daily.  Most patients will experience some swelling and bruising in the area of the incisions.  Ice packs will help.  Swelling and bruising can take several days to resolve.   It is common to experience some constipation after surgery.  Increasing fluid intake and taking a stool softener (such as Colace) will usually help or prevent this problem from occurring.  A mild laxative (Milk of Magnesia or Miralax) should be taken according to package instructions if there has been no bowel movement after 48 hours.  You will likely have Dermabond (topical glue) over your incisions.  This seals the incisions and allows you to bathe and shower at any time after your surgery.  Glue should remain in place for up to 10 days.  It may be removed after 10 days by pealing off the Dermabond material or using Vaseline or naval jelly to remove.  If you have steri-strips over your incisions, you may remove the gauze bandage on the second day after surgery, and you may shower at that time.  Leave your steri-strips (small skin tapes) in place directly over the incision.  These strips should remain on the  skin for 5-7 days and then be removed.  You may get them wet in the shower and pat them dry.  Any sutures or staples will be removed at the office during your follow-up visit.  ACTIVITIES:  You may resume regular (light) daily activities beginning the next day - such as daily self-care, walking, climbing stairs - gradually increasing activities as tolerated.  You may have sexual intercourse when it is comfortable.  Refrain from any heavy lifting or straining until approved by your doctor.  You may drive when you are no longer taking prescription pain medication, when you can comfortably wear a seatbelt, and when you can safely maneuver your car and apply brakes.  You should see your doctor in the office for a follow-up appointment approximately 2-3 weeks after your surgery.  Make sure that you call for this appointment within a day or two after you arrive home to insure a convenient appointment time.  WHEN TO CALL YOUR DOCTOR: Fever over 101.0 Inability to urinate Continued bleeding from incision Increased pain, redness, or drainage from the incision Increasing abdominal pain  The clinic staff is available to answer your questions during regular business hours.  Please don't hesitate to call and ask to speak to one of the nurses for clinical concerns.  If you have a medical emergency, go to the nearest emergency room or call 911.  A surgeon from [facility] is always on call for the hospital.  Nicole Kirkey, MD [facility] Office: [phone] Toll Free:    [phone] FAX [phone]  Website: [email or web address] 

## 2023-07-11 NOTE — Progress Notes (Addendum)
RN attempting to reach MD [facility] regarding symptomatic anemia in patient with discharge orders. Hemoglobin is 7.6 and patient c/o dizziness and lightheadedness, as well as blurred vision when standing/walking for prolonged period of time. RN paged MD at 1100, 1115, and 1145. If no response received after third page, RN will escalate.   Per MD White, patient is safe to discharge with hgb 7.6 and symptoms. No further instructions or follow ups provided.  This RN is consulting Rapid Response.  Discharge cancelled.

## 2023-07-11 NOTE — Significant Event (Signed)
Rapid Response Event Note   Reason for Call :  Dizziness with discharge orders  Initial Focused Assessment:  Patient sitting up in bed, grimacing, pallor in color. A&Ox4. Skin warm/dry. Lungs clear. Passing gas, BM this AM. States at previous attempt to have BM noticed "blood in toilet--not sure if I've told anyone that yet." Complaints of pounding headache now bilaterally--"like when I was admitted for GI bleed in October." Also complaints of blurry vision and continued dizziness which started when patient walking around room packing belongings and sustained after sitting/lying in bed.   119/72 (87) HR 71  RR 17 O2 97% RA  CBG 103  Interventions/Plan of Care:  Cancel Discharge Repeat labs Only OOB with staff  Event Summary:  MD Notified: Nat Christen MD Call Time: 1219 Arrival Time: 7846 End Time: 1255  Truddie Crumble, RN

## 2023-07-11 NOTE — Lactation Note (Addendum)
Lactation Consultation Note  Patient Name: Nicole Mclean'I Date: 07/11/2023 Age:27 y.o. Reason for consult: Follow-up assessment  Mother is feeling better and plans to pump more often. Mother expressed 90 ml with recent pumping session.  Mother states she may be discharged today.  Suggest calling for help as needed.   Mother being discharged.  Request breastmilk to take home.  LC delivered cooler with breastmilk.  Added ice.  Encouraged mother to pump q 3 hours for 15 min if she desires to keep up her supply. Suggest calling at home if she has questions.    Feeding Mother's Current Feeding Choice: Breast Milk Lactation Tools Discussed/Used Pumped volume: 90 mL  Interventions Interventions: DEBP Hardie Pulley 07/11/2023, 9:51 AM

## 2023-07-12 ENCOUNTER — Inpatient Hospital Stay (HOSPITAL_COMMUNITY): Payer: No Typology Code available for payment source

## 2023-07-12 LAB — HEMOGLOBIN AND HEMATOCRIT, BLOOD
HCT: 23.8 % — ABNORMAL LOW (ref 36.0–46.0)
Hemoglobin: 7.3 g/dL — ABNORMAL LOW (ref 12.0–15.0)

## 2023-07-12 LAB — CREATININE, SERUM
Creatinine, Ser: 0.71 mg/dL (ref 0.44–1.00)
GFR, Estimated: >60 mL/min (ref >60)

## 2023-07-12 MED ORDER — SODIUM CHLORIDE 0.9 % IV BOLUS
1000.0000 mL | Freq: Once | INTRAVENOUS | Status: AC
  Administered 2023-07-12: 1000 mL via INTRAVENOUS

## 2023-07-12 MED ORDER — KCL IN DEXTROSE-NACL 20-5-0.45 MEQ/L-%-% IV SOLN
INTRAVENOUS | Status: AC
Start: 2023-07-12 — End: 2023-07-13
  Filled 2023-07-12 (×5): qty 1000

## 2023-07-12 NOTE — Progress Notes (Signed)
Patient requesting oxygen. O2 sat 99%, no SOB or increased WOB. Patient educated on indications for oxygen and verbalizes understanding on why she can't have it.

## 2023-07-12 NOTE — Progress Notes (Signed)
Assessment & Plan: POD#7 - status post small bowel resection for neuroendocrine tumor - 07/05/2023 Dr. Donell Beers              Patient did not go home yesterday.  After I evaluated her, she began complaining of dizziness with ambulation.  This morning with persistent nausea, upper abdominal discomfort, and blurry vision.  Hgb stable at 7.3 but nurse reports blood in last BM.  Will begin guiac of all stools.  Will give NS bolus this AM and restart IV fluids.  Will repeat CBC in AM.  Continue to Rx nausea.  Will get abdominal x-ray this AM, hold off on CT scan for now.        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  Nauseated.  Upper abdominal discomfort. Blurry vision.  Objective: Vital signs in last 24 hours: Temp:  [97.4 F (36.3 C)-98.2 F (36.8 C)] 98.2 F (36.8 C) (12/25 0806) Pulse Rate:  [65-82] 65 (12/25 0806) Resp:  [15-18] 17 (12/25 0806) BP: (107-126)/(56-73) 113/56 (12/25 0806) SpO2:  [97 %-99 %] 99 % (12/25 0806) Last BM Date : 07/11/23  Intake/Output from previous day: No intake/output data recorded. Intake/Output this shift: No intake/output data recorded.  Physical Exam: HEENT - sclerae clear, mucous membranes moist Neck - soft Abdomen - soft, wounds dry and intact; area of induration above umbilicus smaller, non-tender Ext - no edema, non-tender  Lab Results:  Recent Labs    07/11/23 0609 07/11/23 1733 07/12/23 0528  WBC 6.6 6.4  --   HGB 7.6* 7.9* 7.3*  HCT 24.9* 25.2* 23.8*  PLT 353 353  --    BMET Recent Labs    07/12/23 0528  CREATININE 0.71   PT/INR No results for input(s): "LABPROT", "INR" in the last 72 hours. Comprehensive Metabolic Panel:    Component Value Date/Time   NA 138 07/08/2023 0556   NA 138 07/06/2023 0631   NA 138 01/10/2023 0943   NA 137 12/28/2022 1052   K 3.8  07/08/2023 0556   K 3.9 07/06/2023 0631   CL 106 07/08/2023 0556   CL 107 07/06/2023 0631   CO2 27 07/08/2023 0556   CO2 24 07/06/2023 0631   BUN 11 07/08/2023 0556   BUN 7 07/06/2023 0631   BUN 8 01/10/2023 0943   BUN 5 (L) 12/28/2022 1052   CREATININE 0.71 07/12/2023 0528   CREATININE 0.83 07/08/2023 0556   GLUCOSE 101 (H) 07/08/2023 0556   GLUCOSE 108 (H) 07/06/2023 0631   CALCIUM 8.6 (L) 07/08/2023 0556   CALCIUM 8.9 07/06/2023 0631   AST 18 05/18/2023 0022   AST 13 (L) 05/06/2023 1013   ALT 26 05/18/2023 0022   ALT 17 05/06/2023 1013   ALKPHOS 102 05/18/2023 0022   ALKPHOS 77 05/06/2023 1013   BILITOT 0.4 05/18/2023 0022   BILITOT 0.5 05/06/2023 1013   BILITOT <0.2 01/10/2023 0943   BILITOT <0.2 12/28/2022 1052   PROT 7.3 05/18/2023 0022   PROT 6.1 (L) 05/06/2023 1013   PROT 5.4 (L) 01/10/2023 0943   PROT 5.9 (L) 12/28/2022 1052   ALBUMIN 4.0 05/18/2023 0022   ALBUMIN 3.2 (L) 05/06/2023 1013   ALBUMIN 3.3 (L) 01/10/2023 0943   ALBUMIN 3.4 (L) 12/28/2022 1052    Studies/Results: No results found.    Darnell Level 07/12/2023  Patient ID: Nicole Habermann E  Mclean, female   DOB: Dec 11, 1995, 27 y.o.   MRN: [id number]

## 2023-07-12 NOTE — Plan of Care (Signed)
  Problem: Education: Goal: Knowledge of General Education information will improve Description: Including pain rating scale, medication(s)/side effects and non-pharmacologic comfort measures Outcome: Progressing   Problem: Health Behavior/Discharge Planning: Goal: Ability to manage health-related needs will improve Outcome: Progressing   Problem: Clinical Measurements: Goal: Will remain free from infection Outcome: Progressing   Problem: Clinical Measurements: Goal: Diagnostic test results will improve Outcome: Not Progressing

## 2023-07-12 NOTE — Lactation Note (Signed)
Lactation Consultation Note  Patient Name: Nicole Mclean Date: 07/12/2023 Age:27 y.o. Reason for consult: Follow-up assessment;Exclusive pumping and bottle feeding  LC went to 6N07 to retrieve EBM that Mom had pumped.  LC took the cooler with 2 bottles of 30 ml each.  Mom sadly said her supply is suffering as she is medicated often.  Empathized with Mom and reassured her that if she increased her pumping when she is feeling better, her supply may increase.  Consult Status Consult Status: PRN Date: 07/13/23 Follow-up type: In-patient    [name] 07/12/2023, 5:53 PM

## 2023-07-13 ENCOUNTER — Other Ambulatory Visit (HOSPITAL_COMMUNITY): Payer: Self-pay

## 2023-07-13 LAB — CBC WITH DIFFERENTIAL/PLATELET
Abs Immature Granulocytes: 0.01 10*3/uL (ref 0.00–0.07)
Basophils Absolute: 0.1 10*3/uL (ref 0.0–0.1)
Basophils Relative: 1 %
Eosinophils Absolute: 0.1 10*3/uL (ref 0.0–0.5)
Eosinophils Relative: 2 %
HCT: 25.2 % — ABNORMAL LOW (ref 36.0–46.0)
Hemoglobin: 7.5 g/dL — ABNORMAL LOW (ref 12.0–15.0)
Immature Granulocytes: 0 %
Lymphocytes Relative: 38 %
Lymphs Abs: 2 10*3/uL (ref 0.7–4.0)
MCH: 24.8 pg — ABNORMAL LOW (ref 26.0–34.0)
MCHC: 29.8 g/dL — ABNORMAL LOW (ref 30.0–36.0)
MCV: 83.4 fL (ref 80.0–100.0)
Monocytes Absolute: 0.3 10*3/uL (ref 0.1–1.0)
Monocytes Relative: 6 %
Neutro Abs: 2.8 10*3/uL (ref 1.7–7.7)
Neutrophils Relative %: 53 %
Platelets: 372 10*3/uL (ref 150–400)
RBC: 3.02 MIL/uL — ABNORMAL LOW (ref 3.87–5.11)
RDW: 14.4 % (ref 11.5–15.5)
WBC: 5.4 10*3/uL (ref 4.0–10.5)
nRBC: 0 % (ref 0.0–0.2)

## 2023-07-13 LAB — BASIC METABOLIC PANEL
Anion gap: 8 (ref 5–15)
BUN: 7 mg/dL (ref 6–20)
CO2: 28 mmol/L (ref 22–32)
Calcium: 9.2 mg/dL (ref 8.9–10.3)
Chloride: 101 mmol/L (ref 98–111)
Creatinine, Ser: 0.88 mg/dL (ref 0.44–1.00)
GFR, Estimated: >60 mL/min (ref >60)
Glucose, Bld: 111 mg/dL — ABNORMAL HIGH (ref 70–99)
Potassium: 4.1 mmol/L (ref 3.5–5.1)
Sodium: 137 mmol/L (ref 135–145)

## 2023-07-13 NOTE — Progress Notes (Signed)
Assessment & Plan: POD#8 - status post small bowel resection for neuroendocrine tumor - 07/05/2023 Dr. Donell Beers - limited po intake, complains of nausea - continue IVF, Zofran - Hgb 7.5 this AM, stable - no sign of bleeding; stool guiac pending - complains of abdominal pain; AXR yesterday with non-specific bowel gas pattern - dizziness with ambulation - assist with ambulation; appears well hydrated  Overall improved without any specific findings.  Continue supportive care.  Dr. Donell Beers to see tomorrow.        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  Complains of dizziness with ambulation.  Objective: Vital signs in last 24 hours: Temp:  [98.2 F (36.8 C)-98.9 F (37.2 C)] 98.2 F (36.8 C) (12/26 0829) Pulse Rate:  [53-79] 53 (12/26 0829) Resp:  [18] 18 (12/26 0829) BP: (94-124)/(46-70) 94/46 (12/26 0829) SpO2:  [93 %-98 %] 97 % (12/26 0829) Last BM Date : 07/12/23  Intake/Output from previous day: 12/25 0701 - 12/26 0700 In: 2867.2 [P.O.:1800; I.V.:1067.2] Out: -  Intake/Output this shift: No intake/output data recorded.  Physical Exam: HEENT - sclerae clear, mucous membranes moist Abdomen - soft, mild upper abd tenderness; slight induration above umbilicus; wounds dry   Lab Results:  Recent Labs    07/11/23 1733 07/12/23 0528 07/13/23 0641  WBC 6.4  --  5.4  HGB 7.9* 7.3* 7.5*  HCT 25.2* 23.8* 25.2*  PLT 353  --  372   BMET Recent Labs    07/12/23 0528 07/13/23 0641  NA  --  137  K  --  4.1  CL  --  101  CO2  --  28  GLUCOSE  --  111*  BUN  --  7  CREATININE 0.71 0.88  CALCIUM  --  9.2   PT/INR No results for input(s): "LABPROT", "INR" in the last 72 hours. Comprehensive Metabolic Panel:    Component Value Date/Time   NA 137 07/13/2023 0641   NA 138 07/08/2023 0556   NA 138  01/10/2023 0943   NA 137 12/28/2022 1052   K 4.1 07/13/2023 0641   K 3.8 07/08/2023 0556   CL 101 07/13/2023 0641   CL 106 07/08/2023 0556   CO2 28 07/13/2023 0641   CO2 27 07/08/2023 0556   BUN 7 07/13/2023 0641   BUN 11 07/08/2023 0556   BUN 8 01/10/2023 0943   BUN 5 (L) 12/28/2022 1052   CREATININE 0.88 07/13/2023 0641   CREATININE 0.71 07/12/2023 0528   GLUCOSE 111 (H) 07/13/2023 0641   GLUCOSE 101 (H) 07/08/2023 0556   CALCIUM 9.2 07/13/2023 0641   CALCIUM 8.6 (L) 07/08/2023 0556   AST 18 05/18/2023 0022   AST 13 (L) 05/06/2023 1013   ALT 26 05/18/2023 0022   ALT 17 05/06/2023 1013   ALKPHOS 102 05/18/2023 0022   ALKPHOS 77 05/06/2023 1013   BILITOT 0.4 05/18/2023 0022   BILITOT 0.5 05/06/2023 1013   BILITOT <0.2 01/10/2023 0943   BILITOT <0.2 12/28/2022 1052   PROT 7.3 05/18/2023 0022   PROT 6.1 (L) 05/06/2023 1013   PROT 5.4 (L) 01/10/2023 0943   PROT 5.9 (L) 12/28/2022 1052   ALBUMIN 4.0 05/18/2023 0022   ALBUMIN 3.2 (L) 05/06/2023 1013   ALBUMIN 3.3 (L) 01/10/2023 0943   ALBUMIN 3.4 (L) 12/28/2022 1052    Studies/Results: DG Abd  2 Views Result Date: 07/12/2023 CLINICAL DATA:  Abdominal pain. EXAM: ABDOMEN - 2 VIEW COMPARISON:  CT scan abdomen and pelvis from 05/18/2023. FINDINGS: The bowel gas pattern is non-obstructive. There is paucity of bowel gas. No evidence of pneumoperitoneum. No acute osseous abnormalities. The soft tissues are within normal limits. Surgical changes, devices, tubes and lines: None. IMPRESSION: *Nonobstructive bowel gas pattern.  No pneumoperitoneum. Electronically Signed   By: Jules Schick M.D.   On: 07/12/2023 12:17      Darnell Level 07/13/2023  Patient ID: Nicole Mclean, female   DOB: 05-07-1996, 27 y.o.   MRN: [id number]

## 2023-07-13 NOTE — Plan of Care (Signed)
  Problem: Education: Goal: Knowledge of General Education information will improve Description: Including pain rating scale, medication(s)/side effects and non-pharmacologic comfort measures Outcome: Progressing   Problem: Clinical Measurements: Goal: Ability to maintain clinical measurements within normal limits will improve Outcome: Progressing   Problem: Activity: Goal: Risk for activity intolerance will decrease Outcome: Progressing   Problem: Pain Management: Goal: General experience of comfort will improve Outcome: Progressing   Problem: Safety: Goal: Ability to remain free from injury will improve Outcome: Progressing

## 2023-07-14 ENCOUNTER — Inpatient Hospital Stay (HOSPITAL_COMMUNITY): Payer: No Typology Code available for payment source

## 2023-07-14 ENCOUNTER — Other Ambulatory Visit (HOSPITAL_COMMUNITY): Payer: Self-pay

## 2023-07-14 LAB — OCCULT BLOOD X 1 CARD TO LAB, STOOL: Fecal Occult Bld: NEGATIVE

## 2023-07-14 MED ORDER — IOHEXOL 350 MG/ML SOLN
75.0000 mL | Freq: Once | INTRAVENOUS | Status: AC | PRN
  Administered 2023-07-14: 75 mL via INTRAVENOUS

## 2023-07-14 MED ORDER — ALBUMIN HUMAN 5 % IV SOLN
25.0000 g | Freq: Once | INTRAVENOUS | Status: AC
  Administered 2023-07-14: 25 g via INTRAVENOUS
  Filled 2023-07-14: qty 500

## 2023-07-14 NOTE — Plan of Care (Signed)
  Problem: Pain Management: Goal: General experience of comfort will improve Outcome: Progressing   Problem: Safety: Goal: Ability to remain free from injury will improve Outcome: Progressing   Problem: Skin Integrity: Goal: Risk for impaired skin integrity will decrease Outcome: Progressing

## 2023-07-14 NOTE — Plan of Care (Signed)
  Problem: Education: Goal: Knowledge of General Education information will improve Description: Including pain rating scale, medication(s)/side effects and non-pharmacologic comfort measures Outcome: Progressing   Problem: Health Behavior/Discharge Planning: Goal: Ability to manage health-related needs will improve Outcome: Progressing   Problem: Clinical Measurements: Goal: Will remain free from infection Outcome: Progressing Goal: Cardiovascular complication will be avoided Outcome: Progressing   Problem: Elimination: Goal: Will not experience complications related to urinary retention Outcome: Progressing   Problem: Pain Management: Goal: General experience of comfort will improve Outcome: Progressing   Problem: Safety: Goal: Ability to remain free from injury will improve Outcome: Progressing

## 2023-07-14 NOTE — Progress Notes (Signed)
Assessment & Plan: POD#9 - status post small bowel resection for neuroendocrine tumor - 07/05/2023 Dr. Donell Beers - continues to have severe dizziness and nausea.  Dizziness to near syncope level.  Pain improved but still severe.   - dizziness with ambulation - assist with ambulation; appears well hydrated  Will get CT today given how she is not progressing.   Albumin bolus for dizziness.  ABL anemia on top of chronic anemia.   Calorie counts for poor PO intake Not ready for d/c.    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    Objective: Vital signs in last 24 hours: Temp:  [98 F (36.7 C)-98.6 F (37 C)] 98.6 F (37 C) (12/27 0825) Pulse Rate:  [63-74] 69 (12/27 0825) Resp:  [16-18] 17 (12/27 0825) BP: (93-112)/(50-57) 103/56 (12/27 0825) SpO2:  [95 %-100 %] 97 % (12/27 0825) Last BM Date : 07/12/23  Intake/Output from previous day: 12/26 0701 - 12/27 0700 In: 480 [P.O.:480] Out: -  Intake/Output this shift: No intake/output data recorded.  Physical Exam: HEENT - sclerae clear, mucous membranes moist Resp:  breathing comfortably Abdomen - soft, mild upper abd tenderness   Lab Results:  Recent Labs    07/11/23 1733 07/12/23 0528 07/13/23 0641  WBC 6.4  --  5.4  HGB 7.9* 7.3* 7.5*  HCT 25.2* 23.8* 25.2*  PLT 353  --  372   BMET Recent Labs    07/12/23 0528 07/13/23 0641  NA  --  137  K  --  4.1  CL  --  101  CO2  --  28  GLUCOSE  --  111*  BUN  --  7  CREATININE 0.71 0.88  CALCIUM  --  9.2   PT/INR No results for input(s): "LABPROT", "INR" in the last 72 hours. Comprehensive  Metabolic Panel:    Component Value Date/Time   NA 137 07/13/2023 0641   NA 138 07/08/2023 0556   NA 138 01/10/2023 0943   NA 137 12/28/2022 1052   K 4.1 07/13/2023 0641   K 3.8 07/08/2023 0556   CL 101 07/13/2023 0641   CL 106 07/08/2023 0556   CO2 28 07/13/2023 0641   CO2 27 07/08/2023 0556   BUN 7 07/13/2023 0641   BUN 11 07/08/2023 0556   BUN 8 01/10/2023 0943   BUN 5 (L) 12/28/2022 1052   CREATININE 0.88 07/13/2023 0641   CREATININE 0.71 07/12/2023 0528   GLUCOSE 111 (H) 07/13/2023 0641   GLUCOSE 101 (H) 07/08/2023 0556   CALCIUM 9.2 07/13/2023 0641   CALCIUM 8.6 (L) 07/08/2023 0556   AST 18 05/18/2023 0022   AST 13 (L) 05/06/2023 1013   ALT 26 05/18/2023 0022   ALT 17 05/06/2023 1013   ALKPHOS 102 05/18/2023 0022   ALKPHOS 77 05/06/2023 1013   BILITOT 0.4 05/18/2023 0022   BILITOT 0.5 05/06/2023 1013   BILITOT <0.2 01/10/2023 0943   BILITOT <0.2 12/28/2022 1052   PROT 7.3 05/18/2023 0022   PROT 6.1 (L) 05/06/2023 1013   PROT 5.4 (L) 01/10/2023 0943   PROT 5.9 (L) 12/28/2022 1052   ALBUMIN 4.0 05/18/2023 0022  ALBUMIN 3.2 (L) 05/06/2023 1013   ALBUMIN 3.3 (L) 01/10/2023 0943   ALBUMIN 3.4 (L) 12/28/2022 1052    Studies/Results: No results found.     Almond Lint 07/14/2023  Patient ID: [name], female   DOB: June 15, 1996, 27 y.o.   MRN: [id number]

## 2023-07-14 NOTE — Progress Notes (Signed)
Pt request to speak with Dr. Donell Beers per outbound call to [facility] at [phone]. Left detailed vm msg for triage nurse to return to call.

## 2023-07-15 ENCOUNTER — Other Ambulatory Visit (HOSPITAL_COMMUNITY): Payer: Self-pay

## 2023-07-15 LAB — BASIC METABOLIC PANEL
Anion gap: 9 (ref 5–15)
BUN: 6 mg/dL (ref 6–20)
CO2: 26 mmol/L (ref 22–32)
Calcium: 9.2 mg/dL (ref 8.9–10.3)
Chloride: 104 mmol/L (ref 98–111)
Creatinine, Ser: 0.89 mg/dL (ref 0.44–1.00)
GFR, Estimated: >60 mL/min (ref >60)
Glucose, Bld: 112 mg/dL — ABNORMAL HIGH (ref 70–99)
Potassium: 4.1 mmol/L (ref 3.5–5.1)
Sodium: 139 mmol/L (ref 135–145)

## 2023-07-15 LAB — CBC
HCT: 25.8 % — ABNORMAL LOW (ref 36.0–46.0)
Hemoglobin: 7.9 g/dL — ABNORMAL LOW (ref 12.0–15.0)
MCH: 25.4 pg — ABNORMAL LOW (ref 26.0–34.0)
MCHC: 30.6 g/dL (ref 30.0–36.0)
MCV: 83 fL (ref 80.0–100.0)
Platelets: 383 10*3/uL (ref 150–400)
RBC: 3.11 MIL/uL — ABNORMAL LOW (ref 3.87–5.11)
RDW: 14.3 % (ref 11.5–15.5)
WBC: 6 10*3/uL (ref 4.0–10.5)
nRBC: 0 % (ref 0.0–0.2)

## 2023-07-15 MED ORDER — ALBUMIN HUMAN 5 % IV SOLN
25.0000 g | Freq: Once | INTRAVENOUS | Status: AC
  Administered 2023-07-15: 25 g via INTRAVENOUS
  Filled 2023-07-15: qty 500

## 2023-07-15 NOTE — TOC Transition Note (Signed)
 Discharge medications are filled and stored in the [facility] awaiting patient discharge.

## 2023-07-15 NOTE — Progress Notes (Signed)
Assessment & Plan: POD#10 - status post small bowel resection for neuroendocrine tumor - 07/05/2023 Dr. Donell Beers - Dizziness improved with albumin yesterday, will give an additional albumin bolus today as blood pressures are soft. 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 Hgb stable at 7.9. CT yesterday unremarkable.  Objective: Vital signs in last 24 hours: Temp:  [98 F (36.7 C)-98.7 F (37.1 C)] 98 F (36.7 C) (12/28 0342) Pulse Rate:  [66-87] 80 (12/28 0825) Resp:  [17-19] 17 (12/28 0825) BP: (92-115)/(47-67) 92/47 (12/28 0825) SpO2:  [96 %-100 %] 96 % (12/28 0825) Last BM Date : 07/12/23  Intake/Output from previous day: 12/27 0701 - 12/28 0700 In: 240 [P.O.:240] Out: -  Intake/Output this shift: No intake/output data recorded.  Physical Exam: General: resting comfortable, NAD Neuro: alert and oriented Resp:  breathing comfortably on room air Abdomen - soft, nondistended, incisions clean and dry with steristrips in place.   Lab Results:  Recent Labs    07/13/23 0641 07/15/23 0612  WBC 5.4 6.0  HGB 7.5* 7.9*  HCT 25.2* 25.8*  PLT 372 383   BMET Recent Labs    07/13/23 0641 07/15/23 0612  NA 137 139  K 4.1 4.1  CL 101 104  CO2 28 26  GLUCOSE 111* 112*  BUN 7 6  CREATININE 0.88 0.89  CALCIUM 9.2 9.2   PT/INR No results for input(s): "LABPROT", "INR" in the last 72 hours. Comprehensive Metabolic Panel:    Component Value Date/Time   NA 139 07/15/2023 0612   NA 137 07/13/2023 0641   NA 138 01/10/2023 0943   NA 137 12/28/2022 1052   K 4.1 07/15/2023 0612   K 4.1 07/13/2023 0641   CL 104 07/15/2023 0612   CL 101 07/13/2023 0641   CO2 26 07/15/2023 0612   CO2 28 07/13/2023 0641   BUN 6 07/15/2023 0612   BUN 7 07/13/2023 0641   BUN 8  01/10/2023 0943   BUN 5 (L) 12/28/2022 1052   CREATININE 0.89 07/15/2023 0612   CREATININE 0.88 07/13/2023 0641   GLUCOSE 112 (H) 07/15/2023 0612   GLUCOSE 111 (H) 07/13/2023 0641   CALCIUM 9.2 07/15/2023 0612   CALCIUM 9.2 07/13/2023 0641   AST 18 05/18/2023 0022   AST 13 (L) 05/06/2023 1013   ALT 26 05/18/2023 0022   ALT 17 05/06/2023 1013   ALKPHOS 102 05/18/2023 0022   ALKPHOS 77 05/06/2023 1013   BILITOT 0.4 05/18/2023 0022   BILITOT 0.5 05/06/2023 1013   BILITOT <0.2 01/10/2023 0943   BILITOT <0.2 12/28/2022 1052   PROT 7.3 05/18/2023 0022   PROT 6.1 (L) 05/06/2023 1013   PROT 5.4 (L) 01/10/2023 0943   PROT 5.9 (L) 12/28/2022 1052   ALBUMIN 4.0 05/18/2023 0022   ALBUMIN 3.2 (L) 05/06/2023 1013   ALBUMIN 3.3 (L) 01/10/2023 0943   ALBUMIN 3.4 (L) 12/28/2022 1052    Studies/Results: CT ABDOMEN PELVIS W CONTRAST Result Date: 07/14/2023 CLINICAL DATA:  Abdominal pain. Severe nausea. Postop from bowel resection for neuroendocrine tumor. * Tracking Code: BO * EXAM: CT ABDOMEN AND PELVIS WITH CONTRAST TECHNIQUE: Multidetector CT imaging of the abdomen and pelvis was performed using the standard protocol following bolus administration of intravenous contrast. RADIATION DOSE REDUCTION: This exam was performed  according to the departmental dose-optimization program which includes automated exposure control, adjustment of the mA and/or kV according to patient size and/or use of iterative reconstruction technique. CONTRAST:  75mL OMNIPAQUE IOHEXOL 350 MG/ML SOLN COMPARISON:  05/18/2023 FINDINGS: Lower Chest: No acute findings. Hepatobiliary: No suspicious hepatic masses identified. Gallbladder is unremarkable. No evidence of biliary ductal dilatation. Pancreas:  No mass or inflammatory changes. Spleen: Within normal limits in size and appearance. Adrenals/Urinary Tract: No suspicious masses identified. No evidence of ureteral calculi or hydronephrosis. Stomach/Bowel: Expected postop changes  are seen from recent right abdominal small bowel resection. No No evidence of bowel obstruction. A tiny postop fluid collection is seen in the paraumbilical abdominal wall subcutaneous tissues measuring 1.9 x 1.8 cm. Vascular/Lymphatic: No pathologically enlarged lymph nodes. No acute vascular findings. Reproductive:  No mass or other significant abnormality. Other:  None. Musculoskeletal:  No suspicious bone lesions identified. IMPRESSION: Expected postop changes from recent small bowel resection. No evidence of bowel obstruction. 2 cm postop fluid collection in the paraumbilical abdominal wall subcutaneous tissues. Electronically Signed   By: Nicole Mclean M.D.   On: 07/14/2023 17:50       Nicole Mclean 07/15/2023  Patient ID: Nicole Mclean, female   DOB: [birth year], 27 y.o.   MRN: [id number]

## 2023-07-15 NOTE — Plan of Care (Signed)

## 2023-07-16 MED ORDER — ONDANSETRON 4 MG PO TBDP: 4.0000 mg | ORAL_TABLET | Freq: Four times a day (QID) | ORAL | 0 refills | Status: DC | PRN

## 2023-07-16 MED ORDER — METHOCARBAMOL 500 MG PO TABS: 500.0000 mg | ORAL_TABLET | Freq: Three times a day (TID) | ORAL | 0 refills | Status: DC | PRN

## 2023-07-16 NOTE — Plan of Care (Signed)
°  Problem: Nutrition: Goal: Adequate nutrition will be maintained Outcome: Progressing   Problem: Coping: Goal: Level of anxiety will decrease Outcome: Progressing   Problem: Elimination: Goal: Will not experience complications related to bowel motility Outcome: Progressing   Problem: Pain Management: Goal: General experience of comfort will improve Outcome: Progressing

## 2023-07-16 NOTE — Progress Notes (Signed)
Patient is feeling much better today. Vitals stable, dizziness much improved. She would like to go home today. All questions answered, follow up arranged.

## 2023-08-21 DIAGNOSIS — C7A8 Other malignant neuroendocrine tumors: Secondary | ICD-10-CM | POA: Insufficient documentation

## 2023-08-21 DIAGNOSIS — R5383 Other fatigue: Secondary | ICD-10-CM | POA: Insufficient documentation

## 2023-08-23 ENCOUNTER — Other Ambulatory Visit (HOSPITAL_COMMUNITY): Payer: Self-pay | Admitting: General Surgery

## 2023-08-23 DIAGNOSIS — D3A8 Other benign neuroendocrine tumors: Secondary | ICD-10-CM

## 2023-09-01 ENCOUNTER — Encounter (HOSPITAL_COMMUNITY): Payer: Self-pay

## 2023-09-01 ENCOUNTER — Encounter (HOSPITAL_COMMUNITY)
Admission: RE | Admit: 2023-09-01 | Discharge: 2023-09-01 | Disposition: A | Discharge status: Home / Self Care | Payer: No Typology Code available for payment source | Source: Ambulatory Visit | Attending: General Surgery | Admitting: General Surgery

## 2023-09-01 DIAGNOSIS — D3A8 Other benign neuroendocrine tumors: Secondary | ICD-10-CM | POA: Diagnosis present

## 2023-09-01 MED ORDER — COPPER CU 64 DOTATATE 1 MCI/ML IV SOLN
4.0000 | Freq: Once | INTRAVENOUS | Status: AC
  Administered 2023-09-01: 3.97 via INTRAVENOUS

## 2023-10-12 ENCOUNTER — Ambulatory Visit

## 2023-10-18 ENCOUNTER — Telehealth

## 2023-10-18 DIAGNOSIS — B9689 Other specified bacterial agents as the cause of diseases classified elsewhere: Secondary | ICD-10-CM | POA: Diagnosis not present

## 2023-10-18 DIAGNOSIS — N760 Acute vaginitis: Secondary | ICD-10-CM

## 2023-10-18 MED ORDER — METRONIDAZOLE 500 MG PO TABS: 500.0000 mg | ORAL_TABLET | Freq: Two times a day (BID) | ORAL | 0 refills | Status: AC

## 2023-10-18 NOTE — Patient Instructions (Signed)
 Nicole Mclean, thank you for joining [name], PA-C for today's virtual visit.  While this provider is not your primary care provider (PCP), if your PCP is located in our provider database this encounter information will be shared with them immediately following your visit.   A [facility] MyChart account gives you access to today's visit and all your visits, tests, and labs performed at [facility] " click here if you don't have a [place] MyChart account or go to [email or web address]  Consent: (Patient) Nicole Mclean provided verbal consent for this virtual visit at the beginning of the encounter.  Current Medications:  Current Outpatient Medications:    metroNIDAZOLE (FLAGYL) 500 MG tablet, Take 1 tablet (500 mg total) by mouth 2 (two) times daily for 7 days., Disp: 14 tablet, Rfl: 0   acetaminophen (TYLENOL) 500 MG tablet, Take 1,000 mg by mouth 2 (two) times daily as needed for moderate pain (pain score 4-6) or mild pain (pain score 1-3) (Stomach pain)., Disp: , Rfl:    ketorolac (TORADOL) 10 MG tablet, Take 1 tablet (10 mg total) by mouth every 6 (six) hours as needed for moderate pain (pain score 4-6)., Disp: 12 tablet, Rfl: 0   methocarbamol (ROBAXIN) 500 MG tablet, Take 1 tablet (500 mg total) by mouth every 8 (eight) hours as needed for muscle spasms., Disp: 20 tablet, Rfl: 0   ondansetron (ZOFRAN-ODT) 4 MG disintegrating tablet, Take 1 tablet (4 mg total) by mouth every 6 (six) hours as needed for nausea., Disp: 20 tablet, Rfl: 0   oxyCODONE (OXY IR/ROXICODONE) 5 MG immediate release tablet, Take 1 tablet (5 mg total) by mouth every 6 (six) hours as needed for moderate pain (pain score 4-6)., Disp: 20 tablet, Rfl: 0   Medications ordered in this encounter:  Meds ordered this encounter  Medications   metroNIDAZOLE (FLAGYL) 500 MG tablet    Sig: Take 1 tablet (500 mg total) by mouth 2 (two) times daily for 7 days.    Dispense:  14 tablet    Refill:  0     Supervising Provider:   Merrilee Jansky [[id number]]     *If you need refills on other medications prior to your next appointment, please contact your pharmacy*  Follow-Up: Call back or seek an in-person evaluation if the symptoms worsen or if the condition fails to improve as anticipated.  [facility] [phone]  Other Instructions Vaginal Infection (Bacterial Vaginosis): What to Know  Bacterial vaginosis is an infection of the vagina. It happens when the balance of normal germs (bacteria) in the vagina changes. If you don't get treated, it can make it easier for you to get other infections from sex. These are called sexually transmitted infections (STIs). If you're pregnant, you need to get treated right away. This infection can cause a baby to be born early or at a low birth weight. What are the causes? This infection happens when too many harmful germs grow in the vagina. You can't get this infection from toilet seats, bedsheets, swimming pools, or things that touch your vagina. What increases the risk? Having sex with a new person or more than one person. Having sex without protection. Douching. Having an intrauterine device (IUD). Smoking. Using drugs or drinking alcohol. These can lead you to do risky things. Taking certain antibiotics. Being pregnant. What are the signs or symptoms? Some females have no symptoms. Symptoms may include: A gray or white discharge from your vagina. It can  be watery or foamy. A fishy smell. This can happen after sex or during your menstrual period. Itching in and around your vagina. Burning or pain when you pee. How is this treated? This infection is treated with antibiotics. These may be given to you as: A pill. A cream for your vagina. A medicine that you put into your vagina (suppository). If the infection comes back, you may need more antibiotics. Follow these instructions at home: Medicines Take your medicines as  told. Take or use your antibiotics as told. Do not stop using them even if you start to feel better. General instructions If the person you have sex with is a female, tell her that you have this infection. She will need to follow up with her doctor. Female partners don't need to be treated. Do not have sex until you finish treatment. Drink more fluids as told. Keep your vagina and butt clean. Wash these areas with warm water each day. Wipe from front to back after you poop. If you're breastfeeding a baby, talk to your doctor if you should keep doing so during treatment. How is this prevented? Self-care Do not douche. Do not use deodorant sprays on your vagina. Wear cotton underwear. Do not wear tight pants and pantyhose, especially in the summer. Safe sex Use condoms the correct way and every time you have sex. Use dental dams to protect yourself during oral sex. Limit how many people you have sex with. Get tested for STIs. The person you have sex with should also get tested. Drugs and alcohol Do not smoke, vape, or use nicotine or tobacco. Do not use drugs. Limit the amount of alcohol you drink because it can lead you to do risky things. Where to find more information To learn more: Go to TonerPromos.no. Click Health Topics A-Z. Type "bacterial vaginosis" in the search bar. American Sexual Health Association (ASHA): ashasexualhealth.org U.S. Department of Health and [employer], Office on Women's Health: TravelLesson.ca Contact a doctor if: Your symptoms don't get better, even after treatment. You have more discharge or pain when you pee. You have a fever or chills. You have pain in your belly or in the area between your hips. You have pain during sex. You bleed from your vagina between menstrual periods. This information is not intended to replace advice given to you by your health care provider. Make sure you discuss any questions you have with your health care  provider. Document Revised: 12/21/2022 Document Reviewed: 12/21/2022 Elsevier Patient Education  2024 Elsevier Inc.   If you have been instructed to have an in-person evaluation today at a local Urgent Care facility, please use the link below. It will take you to a list of all of our available [facility], including address, phone number and hours of operation. Please do not delay care.  [facility]  If you or a family member do not have a primary care provider, use the link below to schedule a visit and establish care. When you choose a [facility] primary care physician or advanced practice provider, you gain a long-term partner in health. Find a Primary Care Provider  Learn more about [facility] in-office and virtual care options:  - Get Care Now

## 2023-10-18 NOTE — Progress Notes (Signed)
 Virtual Visit Consent   [name], you are scheduled for a virtual visit with a [facility] provider today. Just as with appointments in the office, your consent must be obtained to participate. Your consent will be active for this visit and any virtual visit you may have with one of our providers in the next 365 days. If you have a MyChart account, a copy of this consent can be sent to you electronically.  As this is a virtual visit, video technology does not allow for your provider to perform a traditional examination. This may limit your provider's ability to fully assess your condition. If your provider identifies any concerns that need to be evaluated in person or the need to arrange testing (such as labs, EKG, etc.), we will make arrangements to do so. Although advances in technology are sophisticated, we cannot ensure that it will always work on either your end or our end. If the connection with a video visit is poor, the visit may have to be switched to a telephone visit. With either a video or telephone visit, we are not always able to ensure that we have a secure connection.  By engaging in this virtual visit, you consent to the provision of healthcare and authorize for your insurance to be billed (if applicable) for the services provided during this visit. Depending on your insurance coverage, you may receive a charge related to this service.  I need to obtain your verbal consent now. Are you willing to proceed with your visit today? Nicole Mclean has provided verbal consent on 10/18/2023 for a virtual visit (video or telephone). Nicole Loveless, PA-C  Date: 10/18/2023 9:18 AM   Virtual Visit via Video Note   I, Nicole Mclean, connected with  Nicole Mclean  ([id number], 29-27-1997) on 10/18/23 at  9:15 AM EDT by a video-enabled telemedicine application and verified that I am speaking with the correct person using two identifiers.  Location: Patient: Virtual Visit Location  Patient: Home Provider: Virtual Visit Location Provider: Home Office   I discussed the limitations of evaluation and management by telemedicine and the availability of in person appointments. The patient expressed understanding and agreed to proceed.    History of Present Illness: Nicole Mclean is a 28 y.o. who identifies as a female who was assigned female at birth, and is being seen today for vaginal discharge.  HPI: Vaginal Discharge The patient's primary symptoms include genital itching, a genital odor and vaginal discharge. The patient's pertinent negatives include no genital lesions or genital rash. This is a new problem. The current episode started 1 to 4 weeks ago (1 week). The problem occurs constantly. The problem has been unchanged. The patient is experiencing no pain. She is not pregnant. Associated symptoms include abdominal pain (mild cramping) and back pain (mildly). Pertinent negatives include no chills, dysuria, fever, flank pain, frequency or nausea. The vaginal discharge was mucoid, thin, malodorous and white. There has been no bleeding. She has not been passing clots. She has not been passing tissue. Nothing aggravates the symptoms. She has tried nothing for the symptoms. The treatment provided no relief. She is not sexually active (not over the last few months).     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   GI bleeding 05/04/2023   Postpartum care following cesarean delivery 03/07/2023   Contraception management 03/07/2023   S/P cesarean section 01/17/2023  Other iron deficiency anemias 04/30/2015    Allergies: No Known Allergies Medications:  Current Outpatient Medications:    metroNIDAZOLE (FLAGYL) 500 MG tablet, Take 1 tablet (500 mg total) by mouth 2 (two) times daily for 7 days., Disp: 14 tablet, Rfl: 0   acetaminophen (TYLENOL) 500 MG tablet, Take 1,000 mg by mouth 2 (two)  times daily as needed for moderate pain (pain score 4-6) or mild pain (pain score 1-3) (Stomach pain)., Disp: , Rfl:    ketorolac (TORADOL) 10 MG tablet, Take 1 tablet (10 mg total) by mouth every 6 (six) hours as needed for moderate pain (pain score 4-6)., Disp: 12 tablet, Rfl: 0   methocarbamol (ROBAXIN) 500 MG tablet, Take 1 tablet (500 mg total) by mouth every 8 (eight) hours as needed for muscle spasms., Disp: 20 tablet, Rfl: 0   ondansetron (ZOFRAN-ODT) 4 MG disintegrating tablet, Take 1 tablet (4 mg total) by mouth every 6 (six) hours as needed for nausea., Disp: 20 tablet, Rfl: 0   oxyCODONE (OXY IR/ROXICODONE) 5 MG immediate release tablet, Take 1 tablet (5 mg total) by mouth every 6 (six) hours as needed for moderate pain (pain score 4-6)., Disp: 20 tablet, Rfl: 0  Observations/Objective: Patient is well-developed, well-nourished in no acute distress.  Resting comfortably at home.  Head is normocephalic, atraumatic.  No labored breathing.  Speech is clear and coherent with logical content.  Patient is alert and oriented at baseline.    Assessment and Plan: 1. BV (bacterial vaginosis) (Primary) - metroNIDAZOLE (FLAGYL) 500 MG tablet; Take 1 tablet (500 mg total) by mouth 2 (two) times daily for 7 days.  Dispense: 14 tablet; Refill: 0  - Symptoms consistent with BV - Metronidazole prescribed - Limit bubble baths, scented lotions/soaps/detergents - Limit tight fitting clothing - Seek on person evaluation if not improving or if symptoms worsen   Follow Up Instructions: I discussed the assessment and treatment plan with the patient. The patient was provided an opportunity to ask questions and all were answered. The patient agreed with the plan and demonstrated an understanding of the instructions.  A copy of instructions were sent to the patient via MyChart unless otherwise noted below.    The patient was advised to call back or seek an in-person evaluation if the symptoms worsen  or if the condition fails to improve as anticipated.    Nicole Loveless, PA-C

## 2023-11-13 ENCOUNTER — Telehealth: Admitting: Family Medicine

## 2023-11-13 DIAGNOSIS — J209 Acute bronchitis, unspecified: Secondary | ICD-10-CM

## 2023-11-13 MED ORDER — PROMETHAZINE-DM 6.25-15 MG/5ML PO SYRP: 5.0000 mL | ORAL_SOLUTION | Freq: Four times a day (QID) | ORAL | 0 refills | Status: DC | PRN

## 2023-11-13 MED ORDER — ALBUTEROL SULFATE HFA 108 (90 BASE) MCG/ACT IN AERS: 1.0000 | INHALATION_SPRAY | Freq: Four times a day (QID) | RESPIRATORY_TRACT | 0 refills | Status: DC | PRN

## 2023-11-13 MED ORDER — AZITHROMYCIN 250 MG PO TABS: ORAL_TABLET | ORAL | 0 refills | Status: AC

## 2023-11-13 NOTE — Progress Notes (Signed)
 Virtual Visit Consent   Nicole Mclean, you are scheduled for a virtual visit with a [facility] provider today. Just as with appointments in the office, your consent must be obtained to participate. Your consent will be active for this visit and any virtual visit you may have with one of our providers in the next 365 days. If you have a MyChart account, a copy of this consent can be sent to you electronically.  As this is a virtual visit, video technology does not allow for your provider to perform a traditional examination. This may limit your provider's ability to fully assess your condition. If your provider identifies any concerns that need to be evaluated in person or the need to arrange testing (such as labs, EKG, etc.), we will make arrangements to do so. Although advances in technology are sophisticated, we cannot ensure that it will always work on either your end or our end. If the connection with a video visit is poor, the visit may have to be switched to a telephone visit. With either a video or telephone visit, we are not always able to ensure that we have a secure connection.  By engaging in this virtual visit, you consent to the provision of healthcare and authorize for your insurance to be billed (if applicable) for the services provided during this visit. Depending on your insurance coverage, you may receive a charge related to this service.  I need to obtain your verbal consent now. Are you willing to proceed with your visit today? Nicole Mclean has provided verbal consent on 11/13/2023 for a virtual visit (video or telephone). Lanetta Pion, NP  Date: 11/13/2023 2:14 PM   Virtual Visit via Video Note   I, Lanetta Pion, connected with  Nicole Mclean  ([id number], [birth year]) on 11/13/23 at  2:15 PM EDT by a video-enabled telemedicine application and verified that I am speaking with the correct person using two identifiers.  Location: Patient: Virtual Visit Location Patient:  Home Provider: Virtual Visit Location Provider: Home Office   I discussed the limitations of evaluation and management by telemedicine and the availability of in person appointments. The patient expressed understanding and agreed to proceed.    History of Present Illness: Nicole Mclean is a 28 y.o. 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   GI bleeding 05/04/2023   Postpartum care following cesarean delivery 03/07/2023   Contraception management 03/07/2023   S/P cesarean section 01/17/2023   Other iron  deficiency anemias 04/30/2015    Allergies: No Known Allergies Medications:  Current Outpatient Medications:    acetaminophen  (TYLENOL ) 500 MG tablet, Take 1,000 mg by mouth 2 (two) times daily as needed for moderate pain (pain score 4-6) or mild pain (pain score 1-3) (Stomach pain)., Disp: , Rfl:    ketorolac  (TORADOL ) 10 MG tablet, Take 1 tablet (10 mg total) by mouth every 6 (six)  hours as needed for moderate pain (pain score 4-6)., Disp: 12 tablet, Rfl: 0   methocarbamol  (ROBAXIN ) 500 MG tablet, Take 1 tablet (500 mg total) by mouth every 8 (eight) hours as needed for muscle spasms., Disp: 20 tablet, Rfl: 0   ondansetron  (ZOFRAN -ODT) 4 MG disintegrating tablet, Take 1 tablet (4 mg total) by mouth every 6 (six) hours as needed for nausea., Disp: 20 tablet, Rfl: 0   oxyCODONE  (OXY IR/ROXICODONE ) 5 MG immediate  release tablet, Take 1 tablet (5 mg total) by mouth every 6 (six) hours as needed for moderate pain (pain score 4-6)., Disp: 20 tablet, Rfl: 0  Observations/Objective: Patient is well-developed, well-nourished in no acute distress.  Resting comfortably  at home.  Head is normocephalic, atraumatic.  No labored breathing.  Speech is clear and coherent with logical content.  Patient is alert and oriented at baseline.    Assessment and Plan:   1. Acute bronchitis, unspecified organism (Primary)  - promethazine -dextromethorphan (PROMETHAZINE -DM) 6.25-15 MG/5ML syrup; Take 5 mLs by mouth 4 (four) times daily as needed for cough.  Dispense: 118 mL; Refill: 0 - azithromycin  (ZITHROMAX ) 250 MG tablet; Take 2 tablets on day 1, then 1 tablet daily on days 2 through 5  Dispense: 6 tablet; Refill: 0 - albuterol  (VENTOLIN  HFA) 108 (90 Base) MCG/ACT inhaler; Inhale 1-2 puffs into the lungs every 6 (six) hours as needed for wheezing or shortness of breath (cough).  Dispense: 8 g; Refill: 0   - Take meds as prescribed - Rest voice - Use a cool mist humidifier especially during the winter months when heat dries out the air. - Use saline nose sprays frequently to help soothe nasal passages if they are drying out. - Stay hydrated by drinking plenty of fluids - Keep thermostat turn down low to prevent drying out which can cause a dry cough. - For fever or aches or pains- take tylenol  or ibuprofen  as directed on bottle             * for fevers greater than 101 orally you may alternate ibuprofen  and tylenol  every 3 hours.  If you do not improve you will need a follow up visit in person.                  Reviewed side effects, risks and benefits of medication.    Patient acknowledged agreement and understanding of the plan.   Past Medical, Surgical, Social History, Allergies, and Medications have been Reviewed.    Follow Up Instructions: I discussed the assessment and treatment plan with the  patient. The patient was provided an opportunity to ask questions and all were answered. The patient agreed with the plan and demonstrated an understanding of the instructions.  A copy of instructions were sent to the patient via MyChart unless otherwise noted below.    The patient was advised to call back or seek an in-person evaluation if the symptoms worsen or if the condition fails to improve as anticipated.    Lanetta Pion, NP

## 2023-11-13 NOTE — Patient Instructions (Signed)
 Nicole Mclean, thank you for joining Nicole Pion, NP for today's virtual visit.  While this provider is not your primary care provider (PCP), if your PCP is located in our provider database this encounter information will be shared with them immediately following your visit.   A [facility] MyChart account gives you access to today's visit and all your visits, tests, and labs performed at [facility] " click here if you don't have a [facility] MyChart account or go to [email or web address]  Consent: (Patient) Nicole Mclean provided verbal consent for this virtual visit at the beginning of the encounter.  Current Medications:  Current Outpatient Medications:    albuterol  (VENTOLIN  HFA) 108 (90 Base) MCG/ACT inhaler, Inhale 1-2 puffs into the lungs every 6 (six) hours as needed for wheezing or shortness of breath (cough)., Disp: 8 g, Rfl: 0   azithromycin  (ZITHROMAX ) 250 MG tablet, Take 2 tablets on day 1, then 1 tablet daily on days 2 through 5, Disp: 6 tablet, Rfl: 0   promethazine -dextromethorphan (PROMETHAZINE -DM) 6.25-15 MG/5ML syrup, Take 5 mLs by mouth 4 (four) times daily as needed for cough., Disp: 118 mL, Rfl: 0   acetaminophen  (TYLENOL ) 500 MG tablet, Take 1,000 mg by mouth 2 (two) times daily as needed for moderate pain (pain score 4-6) or mild pain (pain score 1-3) (Stomach pain)., Disp: , Rfl:    ketorolac  (TORADOL ) 10 MG tablet, Take 1 tablet (10 mg total) by mouth every 6 (six) hours as needed for moderate pain (pain score 4-6)., Disp: 12 tablet, Rfl: 0   methocarbamol  (ROBAXIN ) 500 MG tablet, Take 1 tablet (500 mg total) by mouth every 8 (eight) hours as needed for muscle spasms., Disp: 20 tablet, Rfl: 0   ondansetron  (ZOFRAN -ODT) 4 MG disintegrating tablet, Take 1 tablet (4 mg total) by mouth every 6 (six) hours as needed for nausea., Disp: 20 tablet, Rfl: 0   oxyCODONE  (OXY IR/ROXICODONE ) 5 MG immediate release tablet, Take 1 tablet (5 mg total) by mouth every  6 (six) hours as needed for moderate pain (pain score 4-6)., Disp: 20 tablet, Rfl: 0   Medications ordered in this encounter:  Meds ordered this encounter  Medications   promethazine -dextromethorphan (PROMETHAZINE -DM) 6.25-15 MG/5ML syrup    Sig: Take 5 mLs by mouth 4 (four) times daily as needed for cough.    Dispense:  118 mL    Refill:  0    Supervising Provider:   Corine Dice [[id number]]   azithromycin  (ZITHROMAX ) 250 MG tablet    Sig: Take 2 tablets on day 1, then 1 tablet daily on days 2 through 5    Dispense:  6 tablet    Refill:  0    Supervising Provider:   LAMPTEY, PHILIP O [[id number]]   albuterol  (VENTOLIN  HFA) 108 (90 Base) MCG/ACT inhaler    Sig: Inhale 1-2 puffs into the lungs every 6 (six) hours as needed for wheezing or shortness of breath (cough).    Dispense:  8 g    Refill:  0    Supervising Provider:   Corine Dice [[id number]]     *If you need refills on other medications prior to your next appointment, please contact your pharmacy*  Follow-Up: Call back or seek an in-person evaluation if the symptoms worsen or if the condition fails to improve as anticipated.  [facility] [phone]  Other Instructions - Take meds as prescribed - Rest voice - Use a cool mist humidifier especially during the  winter months when heat dries out the air. - Use saline nose sprays frequently to help soothe nasal passages if they are drying out. - Stay hydrated by drinking plenty of fluids - For fever or aches or pains- take tylenol  or ibuprofen  as directed on bottle             * for fevers greater than 101 orally you may alternate ibuprofen  and tylenol  every 3 hours.  If you do not improve you will need a follow up visit in person.                    If you have been instructed to have an in-person evaluation today at a local Urgent Care facility, please use the link below. It will take you to a list of all of our available [facility],  including address, phone number and hours of operation. Please do not delay care.  [facility]  If you or a family member do not have a primary care provider, use the link below to schedule a visit and establish care. When you choose a [facility] primary care physician or advanced practice provider, you gain a long-term partner in health. Find a Primary Care Provider  Learn more about [facility]'s in-office and virtual care options: [facility] - Get Care Now

## 2023-11-30 ENCOUNTER — Telehealth: Admitting: Physician Assistant

## 2023-11-30 DIAGNOSIS — B3731 Acute candidiasis of vulva and vagina: Secondary | ICD-10-CM | POA: Diagnosis not present

## 2023-11-30 MED ORDER — FLUCONAZOLE 150 MG PO TABS: ORAL_TABLET | ORAL | 0 refills | Status: DC

## 2023-11-30 NOTE — Patient Instructions (Signed)
 Nicole Mclean, thank you for joining Hyla Maillard, PA-C for today's virtual visit.  While this provider is not your primary care provider (PCP), if your PCP is located in our provider database this encounter information will be shared with them immediately following your visit.   A [facility] MyChart account gives you access to today's visit and all your visits, tests, and labs performed at [facility] " click here if you don't have a [facility] MyChart account or go to [email or web address]  Consent: (Patient) Nicole Mclean provided verbal consent for this virtual visit at the beginning of the encounter.  Current Medications:  Current Outpatient Medications:    acetaminophen  (TYLENOL ) 500 MG tablet, Take 1,000 mg by mouth 2 (two) times daily as needed for moderate pain (pain score 4-6) or mild pain (pain score 1-3) (Stomach pain)., Disp: , Rfl:    albuterol  (VENTOLIN  HFA) 108 (90 Base) MCG/ACT inhaler, Inhale 1-2 puffs into the lungs every 6 (six) hours as needed for wheezing or shortness of breath (cough)., Disp: 8 g, Rfl: 0   ketorolac  (TORADOL ) 10 MG tablet, Take 1 tablet (10 mg total) by mouth every 6 (six) hours as needed for moderate pain (pain score 4-6)., Disp: 12 tablet, Rfl: 0   methocarbamol  (ROBAXIN ) 500 MG tablet, Take 1 tablet (500 mg total) by mouth every 8 (eight) hours as needed for muscle spasms., Disp: 20 tablet, Rfl: 0   ondansetron  (ZOFRAN -ODT) 4 MG disintegrating tablet, Take 1 tablet (4 mg total) by mouth every 6 (six) hours as needed for nausea., Disp: 20 tablet, Rfl: 0   oxyCODONE  (OXY IR/ROXICODONE ) 5 MG immediate release tablet, Take 1 tablet (5 mg total) by mouth every 6 (six) hours as needed for moderate pain (pain score 4-6)., Disp: 20 tablet, Rfl: 0   promethazine -dextromethorphan (PROMETHAZINE -DM) 6.25-15 MG/5ML syrup, Take 5 mLs by mouth 4 (four) times daily as needed for cough., Disp: 118 mL, Rfl: 0   Medications ordered in this  encounter:  No orders of the defined types were placed in this encounter.    *If you need refills on other medications prior to your next appointment, please contact your pharmacy*  Follow-Up: Call back or seek an in-person evaluation if the symptoms worsen or if the condition fails to improve as anticipated.  [facility] [phone]  Other Instructions Vaginal Yeast Infection, Adult  Vaginal yeast infection is a condition that causes vaginal discharge as well as soreness, swelling, and redness (inflammation) of the vagina. This is a common condition. Some women get this infection frequently. What are the causes? This condition is caused by a change in the normal balance of the yeast (Candida) and normal bacteria that live in the vagina. This change causes an overgrowth of yeast, which causes the inflammation. What increases the risk? The condition is more likely to develop in women who: Take antibiotic medicines. Have diabetes. Take birth control pills. Are pregnant. Douche often. Have a weak body defense system (immune system). Have been taking steroid medicines for a long time. Frequently wear tight clothing. What are the signs or symptoms? Symptoms of this condition include: White, thick, creamy vaginal discharge. Swelling, itching, redness, and irritation of the vagina. The lips of the vagina (labia) may be affected as well. Pain or a burning feeling while urinating. Pain during sex. How is this diagnosed? This condition is diagnosed based on: Your medical history. A physical exam. A pelvic exam. Your health care provider will examine a sample  of your vaginal discharge under a microscope. Your health care provider may send this sample for testing to confirm the diagnosis. How is this treated? This condition is treated with medicine. Medicines may be over-the-counter or prescription. You may be told to use one or more of the following: Medicine that is  taken by mouth (orally). Medicine that is applied as a cream (topically). Medicine that is inserted directly into the vagina (suppository). Follow these instructions at home: Take or apply over-the-counter and prescription medicines only as told by your health care provider. Do not use tampons until your health care provider approves. Do not have sex until your infection has cleared. Sex can prolong or worsen your symptoms of infection. Ask your health care provider when it is safe to resume sexual activity. Keep all follow-up visits. This is important. How is this prevented?  Do not wear tight clothes, such as pantyhose or tight pants. Wear breathable cotton underwear. Do not use douches, perfumed soap, creams, or powders. Wipe from front to back after using the toilet. If you have diabetes, keep your blood sugar levels under control. Ask your health care provider for other ways to prevent yeast infections. Contact a health care provider if: You have a fever. Your symptoms go away and then return. Your symptoms do not get better with treatment. Your symptoms get worse. You have new symptoms. You develop blisters in or around your vagina. You have blood coming from your vagina and it is not your menstrual period. You develop pain in your abdomen. Summary Vaginal yeast infection is a condition that causes discharge as well as soreness, swelling, and redness (inflammation) of the vagina. This condition is treated with medicine. Medicines may be over-the-counter or prescription. Take or apply over-the-counter and prescription medicines only as told by your health care provider. Do not douche. Resume sexual activity or use of tampons as instructed by your health care provider. Contact a health care provider if your symptoms do not get better with treatment or your symptoms go away and then return. This information is not intended to replace advice given to you by your health care provider.  Make sure you discuss any questions you have with your health care provider. Document Revised: 09/21/2020 Document Reviewed: 09/21/2020 Elsevier Patient Education  2024 [employer]   If you have been instructed to have an in-person evaluation today at a local Urgent Care facility, please use the link below. It will take you to a list of all of our available [facility], including address, phone number and hours of operation. Please do not delay care.  [facility]  If you or a family member do not have a primary care provider, use the link below to schedule a visit and establish care. When you choose a [facility] primary care physician or advanced practice provider, you gain a long-term partner in health. Find a Primary Care Provider  Learn more about [facility]'s in-office and virtual care options: [facility] - Get Care Now

## 2023-11-30 NOTE — Progress Notes (Signed)
 Virtual Visit Consent   Nicole Mclean, you are scheduled for a virtual visit with a [facility] provider today. Just as with appointments in the office, your consent must be obtained to participate. Your consent will be active for this visit and any virtual visit you may have with one of our providers in the next 365 days. If you have a MyChart account, a copy of this consent can be sent to you electronically.  As this is a virtual visit, video technology does not allow for your provider to perform a traditional examination. This may limit your provider's ability to fully assess your condition. If your provider identifies any concerns that need to be evaluated in person or the need to arrange testing (such as labs, EKG, etc.), we will make arrangements to do so. Although advances in technology are sophisticated, we cannot ensure that it will always work on either your end or our end. If the connection with a video visit is poor, the visit may have to be switched to a telephone visit. With either a video or telephone visit, we are not always able to ensure that we have a secure connection.  By engaging in this virtual visit, you consent to the provision of healthcare and authorize for your insurance to be billed (if applicable) for the services provided during this visit. Depending on your insurance coverage, you may receive a charge related to this service.  I need to obtain your verbal consent now. Are you willing to proceed with your visit today? Kayona E Bissett has provided verbal consent on 11/30/2023 for a virtual visit (video or telephone). Nicole Mclean, [place]  Date: 11/30/2023 5:13 PM   Virtual Visit via Video Note   I, Nicole Mclean, connected with  Nicole Mclean  ([id number], 1995-08-03) on 11/30/23 at  5:15 PM EDT by a video-enabled telemedicine application and verified that I am speaking with the correct person using two identifiers.  Location: Patient: Virtual Visit Location  Patient: Home Provider: Virtual Visit Location Provider: Home Office   I discussed the limitations of evaluation and management by telemedicine and the availability of in person appointments. The patient expressed understanding and agreed to proceed.    History of Present Illness: Nicole Mclean is a 28 y.o. who identifies as a female who was assigned female at birth, and is being seen today for possible yeast infection. Endorses symptom onset a few days ago including vaginal irritation/rawness, itching and thick, white discharge. Denies fevers, chills, dysuria, urgency/frequency. LMP -- 4/16-20.  Denies concern for pregnancy.   HPI: HPI  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   GI bleeding 05/04/2023   Postpartum care following cesarean delivery 03/07/2023   Contraception management 03/07/2023   S/P cesarean section 01/17/2023   Other iron  deficiency anemias 04/30/2015    Allergies: No Known Allergies Medications: No current outpatient medications on file.  Observations/Objective: Patient is well-developed, well-nourished in no acute distress.  Resting comfortably at home.  Head is normocephalic, atraumatic.  No labored breathing. Speech is clear and coherent with logical content.  Patient is alert and oriented at baseline.   Assessment and Plan: 1. Yeast vaginitis (Primary)  Classic symptoms. No alarm signs or symptoms. Diflucan  150 mg once, repeating in 3 days. Supportive measures and OTC medications reviewed. Follow-up in person for any non-resolving, new or worsening symptoms despite treatment.    Follow Up Instructions:  I discussed the assessment and treatment plan with the patient. The patient was provided an opportunity to ask questions and all were answered. The patient agreed with the plan and demonstrated an understanding of the instructions.  A copy of instructions were sent to  the patient via MyChart unless otherwise noted below.   The patient was advised to call back or seek an in-person evaluation if the symptoms worsen or if the condition fails to improve as anticipated.    Nicole Maillard, PA-C

## 2024-03-02 DIAGNOSIS — K625 Hemorrhage of anus and rectum: Secondary | ICD-10-CM | POA: Insufficient documentation

## 2024-03-02 DIAGNOSIS — K648 Other hemorrhoids: Secondary | ICD-10-CM | POA: Insufficient documentation

## 2024-03-04 DIAGNOSIS — R634 Abnormal weight loss: Secondary | ICD-10-CM | POA: Insufficient documentation

## 2024-03-04 DIAGNOSIS — R232 Flushing: Secondary | ICD-10-CM | POA: Insufficient documentation

## 2024-03-04 DIAGNOSIS — R42 Dizziness and giddiness: Secondary | ICD-10-CM | POA: Insufficient documentation

## 2024-03-04 DIAGNOSIS — K591 Functional diarrhea: Secondary | ICD-10-CM | POA: Insufficient documentation

## 2024-03-05 ENCOUNTER — Other Ambulatory Visit (HOSPITAL_COMMUNITY): Payer: Self-pay | Admitting: General Surgery

## 2024-03-05 DIAGNOSIS — K625 Hemorrhage of anus and rectum: Secondary | ICD-10-CM

## 2024-03-05 DIAGNOSIS — D3A8 Other benign neuroendocrine tumors: Secondary | ICD-10-CM

## 2024-03-05 DIAGNOSIS — R232 Flushing: Secondary | ICD-10-CM

## 2024-03-05 DIAGNOSIS — R634 Abnormal weight loss: Secondary | ICD-10-CM

## 2024-03-05 DIAGNOSIS — C7A8 Other malignant neuroendocrine tumors: Secondary | ICD-10-CM

## 2024-03-07 ENCOUNTER — Telehealth: Payer: Self-pay

## 2024-03-07 NOTE — Telephone Encounter (Signed)
 Patient scheduled for 03-21-24 at 840 with the PA and she knows where we are located and what floor to come to.

## 2024-03-07 NOTE — Telephone Encounter (Signed)
-----   Message from Lynnie Bring sent at 03/06/2024  5:52 PM EDT ----- Regarding: Lets do OV first before colonoscopy. Can see APP Lets do OV first before colonoscopy.  Can see APP RG ----- Message ----- From: Aron Shoulders, MD Sent: 03/04/2024  10:22 AM EDT To: Lynnie Bring, MD  Can you get this lady in for a colonoscopy at some point soon?  She is having a lot of rectal bleeding.  It looks like you did a colonoscopy, then [facility] did an endoscopy, then [facility] did a capsule.   We are putting in formal referral. She had a neuroendocrine small bowel tumor at the [facility] and I did a resection last december.  Getting follow up PET.   I am sending labs.   Tx FB

## 2024-03-11 ENCOUNTER — Encounter (HOSPITAL_COMMUNITY)
Admission: RE | Admit: 2024-03-11 | Discharge: 2024-03-11 | Disposition: A | Discharge status: Home / Self Care | Source: Ambulatory Visit | Attending: General Surgery | Admitting: General Surgery

## 2024-03-11 DIAGNOSIS — K625 Hemorrhage of anus and rectum: Secondary | ICD-10-CM | POA: Diagnosis present

## 2024-03-11 DIAGNOSIS — C7A8 Other malignant neuroendocrine tumors: Secondary | ICD-10-CM | POA: Diagnosis present

## 2024-03-11 DIAGNOSIS — R634 Abnormal weight loss: Secondary | ICD-10-CM | POA: Insufficient documentation

## 2024-03-11 DIAGNOSIS — D3A8 Other benign neuroendocrine tumors: Secondary | ICD-10-CM | POA: Insufficient documentation

## 2024-03-11 DIAGNOSIS — R232 Flushing: Secondary | ICD-10-CM | POA: Diagnosis present

## 2024-03-11 MED ORDER — COPPER CU 64 DOTATATE 1 MCI/ML IV SOLN
4.0000 | Freq: Once | INTRAVENOUS | Status: AC
  Administered 2024-03-11: 3.989 via INTRAVENOUS

## 2024-03-12 ENCOUNTER — Ambulatory Visit: Payer: Self-pay | Admitting: General Surgery

## 2024-03-20 NOTE — Progress Notes (Unsigned)
 03/21/2024 Nicole Mclean [id number] November 08, 1995  Referring provider: [facility], [facility] Primary GI doctor: Dr. Charlanne  ASSESSMENT AND PLAN:  Rectal bleeding, golf ball size clots, loose stools to formed due to bowel resection, no straining 10/11/2022 flexible sigmoidoscopy at [facility] for hematochezia while patient was 21-week pregnant showed grade 2 moderate hemorrhoids with wale sign no specimens collected Declines rectal exam - Prescribe suppositories for internal hemorrhoids. - Provided information on hemorrhoid management. - Consider hemorrhoidal banding if bleeding persists after ruling out other causes with colonoscopy - Advise use of a squatty potty to ease bowel movements, consider pelvic floor PT. -Schedule colonoscopy with EGD, We have discussed the risks of bleeding, infection, perforation, medication reactions, and remote risk of death associated with colonoscopy. All questions were answered and the patient acknowledges these risk and wishes to proceed.  Periumblical AB pain Intermittent crampy pain, worse post prandial or with a BM Recent PET scan negative -Check KUB - consider starting medication for constipation  Nausea with vomiting, denies GERD Can vomit in her sleep 4-5 times since April Wakes up tired, snores at night Nausea in the morning, denies GERD - check for sleep apnea with vomiting at night -TSH, Cortisol, lipase, CBC, CMET -Obtain KUB - promethazine  and pepcid  given to take at night - schedule EGD with colonoscopy, I discussed risks of EGD with patient today, including risk of sedation, bleeding or perforation.  Patient provides understanding and gave verbal consent to proceed. 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 Aron 05/06/2023 EGD Dr. Herschell at [facility] for melena and IDA was unremarkable VCE showed active  bleeding and recent bleeding noted in small bowel recommended double-balloon enteroscopy Transferred to [facility] where patient had double balloon enteroscopy 05/17/2023 the proximal ileum, showed well-differentiated neuroendocrine tumor 07/05/2023 laparoscopic small bowel resection final path well-differentiated neuroendocrine tumor pT3 N1 03/04/2024 chromogranin A less than 20 kidney and liver normal, Hgb 9.5 MCV 79 no leukocytosis 03/11/2024 PET scan no evidence of neuroendocrine tumor recurrence or metastasis  Anemia in patient with history of alpha thalassemia Symptomatic with dizziness, fatigue Has menses monthly, last 6 days, moderate to heavy with tampon replacement every 2-3 hours On Accufer once daily since april 03/04/2024 Hgb 9.5, MCV 79 Recent Labs    07/05/23 1623 07/06/23 0631 07/08/23 0556 07/09/23 0627 07/10/23 0425 07/11/23 0609 07/11/23 1733 07/12/23 0528 07/13/23 0641 07/15/23 0612  HGB 9.7* 8.5* 7.7* 8.1* 7.9* 7.6* 7.9* 7.3* 7.5* 7.9*  - check iron , ferritin, B12  - consider IV iron  if iron  is still low despite Accufer daily  Morbid obesity  Body mass index is 43.46 kg/m.  -Patient has been advised to make an attempt to improve diet and exercise patterns to aid in weight loss. -Recommended diet heavy in fruits and veggies and low in animal meats, cheeses, and dairy products, appropriate calorie intake   Patient Care Team: [facility] as PCP - General Rudy, Carlin LABOR, MD as PCP - OBGYN (Obstetrics and Gynecology) Cathlyn JAYSON Cary, Bobie FORBES, MD as Consulting Physician (Obstetrics and Gynecology)  HISTORY OF PRESENT ILLNESS: 28 y.o. female with a past medical history listed below presents for evaluation of rectal bleeding.   Discussed the use of AI scribe software for clinical note transcription with the patient, who gave verbal consent to proceed.  History of Present Illness   Nicole Mclean is a 28 year  old female with a history of neuroendocrine  tumor who presents with rectal bleeding.  She has experienced persistent rectal bleeding since her pregnancy, described as significant and sometimes requiring the use of a pad, with the passage of golf ball-sized clots. The bleeding occurs without straining and can accompany both regular stools and diarrhea. A flexible sigmoidoscopy during pregnancy attributed the bleeding to hemorrhoids, but symptoms have persisted post-pregnancy and post-resection of a neuroendocrine tumor in December 2024.  She experiences frequent nausea, exacerbated by certain smells, leading to vomiting, sometimes occurring during sleep. The nausea is constant, and Zofran  has been ineffective, causing diarrhea instead. Promethazine  provided some relief when used previously.  Abdominal pain is primarily around the umbilical region, radiating to either side of the abdomen but not downward. The pain is described as a muscle ache that persists even when not severe, worsening with eating and bowel movements. Diarrhea exacerbates her symptoms, including pain, bleeding, and nausea.  She experiences significant fatigue, feeling exhausted despite adequate sleep, and reports dizziness and weakness. Her hemoglobin remains low at 8, despite taking an iron  supplement once daily since April or May 2025, with no improvement in anemia symptoms.  Her menstrual periods have become heavier since childbirth, lasting about six days and requiring the use of Super Plus tampons, which need changing every two to three hours. No significant weight loss, fever, or heartburn, but she does experience occasional chills.  She was adopted and is unaware of any family history of autoimmune diseases. She works Monday through Friday at [employer] and has had to manage her symptoms while maintaining her job. She was previously on short-term disability from June 2024 to March 2025.      She  reports that she has never smoked. She has never been exposed to  tobacco smoke. She has never used smokeless tobacco. She reports that she does not drink alcohol and does not use drugs.  RELEVANT GI HISTORY, IMAGING AND LABS: Results   LABS Hb: 8  RADIOLOGY PET scan: No recurrence or metastasis  DIAGNOSTIC Flexible sigmoidoscopy: Moderate internal hemorrhoids, large and bleeding      CBC    Component Value Date/Time   WBC 6.0 07/15/2023 0612   RBC 3.11 (L) 07/15/2023 0612   HGB 7.9 (L) 07/15/2023 0612   HGB 10.9 (L) 01/10/2023 0943   HCT 25.8 (L) 07/15/2023 0612   HCT 33.2 (L) 01/10/2023 0943   PLT 383 07/15/2023 0612   PLT 254 01/10/2023 0943   MCV 83.0 07/15/2023 0612   MCV 86 01/10/2023 0943   MCH 25.4 (L) 07/15/2023 0612   MCHC 30.6 07/15/2023 0612   RDW 14.3 07/15/2023 0612   RDW 13.8 01/10/2023 0943   LYMPHSABS 2.0 07/13/2023 0641   LYMPHSABS 2.0 12/28/2022 1052   MONOABS 0.3 07/13/2023 0641   EOSABS 0.1 07/13/2023 0641   EOSABS 0.1 12/28/2022 1052   BASOSABS 0.1 07/13/2023 0641   BASOSABS 0.0 12/28/2022 1052   Recent Labs    07/05/23 1623 07/06/23 0631 07/08/23 0556 07/09/23 0627 07/10/23 0425 07/11/23 0609 07/11/23 1733 07/12/23 0528 07/13/23 0641 07/15/23 0612  HGB 9.7* 8.5* 7.7* 8.1* 7.9* 7.6* 7.9* 7.3* 7.5* 7.9*    CMP     Component Value Date/Time   NA 139 07/15/2023 0612   NA 138 01/10/2023 0943   K 4.1 07/15/2023 0612   CL 104 07/15/2023 0612   CO2 26 07/15/2023 0612   GLUCOSE 112 (H) 07/15/2023 0612   BUN 6 07/15/2023 0612   BUN  8 01/10/2023 0943   CREATININE 0.89 07/15/2023 0612   CALCIUM 9.2 07/15/2023 0612   PROT 7.3 05/18/2023 0022   PROT 5.4 (L) 01/10/2023 0943   ALBUMIN  4.0 05/18/2023 0022   ALBUMIN  3.3 (L) 01/10/2023 0943   AST 18 05/18/2023 0022   ALT 26 05/18/2023 0022   ALKPHOS 102 05/18/2023 0022   BILITOT 0.4 05/18/2023 0022   BILITOT <0.2 01/10/2023 0943   GFRNONAA >60 07/15/2023 0612   GFRAA >60 11/12/2018 2205      Latest Ref Rng & Units 05/18/2023   12:22 AM 05/06/2023    10:13 AM 05/04/2023    8:11 PM  Hepatic Function  Total Protein 6.5 - 8.1 g/dL 7.3  6.1  7.1   Albumin  3.5 - 5.0 g/dL 4.0  3.2  3.9   AST 15 - 41 U/L 18  13  15    ALT 0 - 44 U/L 26  17  21    Alk Phosphatase 38 - 126 U/L 102  77  96   Total Bilirubin 0.3 - 1.2 mg/dL 0.4  0.5  0.3       Current Medications:   Current Outpatient Medications (Endocrine & Metabolic):    HALOETTE 0.12-0.015 MG/24HR vaginal ring, 1 VAGINAL RING INTO THE VAGINA EVERY 4 WEEKS LEAVE IN PLACE FOR 3 WEEKS OF A 4-WEEK CYCLE   Current Outpatient Medications (Respiratory):    promethazine  (PHENERGAN ) 25 MG tablet, Take 0.5 tablets (12.5 mg total) by mouth every 6 (six) hours as needed for nausea or vomiting.   Current Outpatient Medications (Hematological):    ACCRUFER 30 MG CAPS, Take by mouth.  Current Outpatient Medications (Other):    famotidine  (PEPCID ) 40 MG tablet, Take 1 tablet (40 mg total) by mouth at bedtime.   hydrocortisone  (ANUSOL -HC) 2.5 % rectal cream, Place 1 Application rectally 2 (two) times daily.   hydrocortisone  (ANUSOL -HC) 25 MG suppository, Place 1 suppository (25 mg total) rectally 2 (two) times daily.   Na Sulfate-K Sulfate-Mg Sulfate concentrate (SUPREP BOWEL PREP KIT) 17.5-3.13-1.6 GM/177ML SOLN, Take 1 kit (354 mLs total) by mouth as directed.   fluconazole  (DIFLUCAN ) 150 MG tablet, Take 1 tablet PO once. Repeat in 3 days if needed.  Medical History:  Past Medical History:  Diagnosis Date   Bleeding hemorrhoid 10/10/2022   Internal hemorrhoids   Chlamydia 01/2016   Gonorrhea    2015 and then 2024, both during pregnancy   Hypertension    Iron  deficiency anemia 04/30/2015   Neuroendocrine tumor    in small bowel   Preeclampsia    Syphilis    UTI (urinary tract infection)    Allergies: No Known Allergies   Surgical History:  She  has a past surgical history that includes Hand surgery; Chin surgery; Flexible sigmoidoscopy (N/A, 10/11/2022); Cesarean section (N/A,  01/17/2023); Esophagogastroduodenoscopy (N/A, 05/06/2023); Givens capsule study (N/A, 05/06/2023); and Laparoscopic small bowel resection (N/A, 07/05/2023). Family History:  Her family history includes Depression in her mother; Diabetes in her father; Heart disease in her father; Hypertension in her father; Lung disease in her mother. She was adopted.  REVIEW OF SYSTEMS  : All other systems reviewed and negative except where noted in the History of Present Illness.  PHYSICAL EXAM: BP 122/62   Pulse 92   Ht 5' 1 (1.549 m)   Wt 230 lb (104.3 kg)   BMI 43.46 kg/m  Physical Exam   GENERAL APPEARANCE: Well nourished, in no apparent distress. HEENT: No cervical lymphadenopathy, unremarkable thyroid , sclerae anicteric, conjunctiva pink.  RESPIRATORY: Respiratory effort normal, breath sounds equal bilaterally without rales, rhonchi, or wheezing. CARDIO: Regular rate and rhythm with no murmurs, rubs, or gallops, peripheral pulses intact. ABDOMEN: Soft, non-distended, active bowel sounds in all four quadrants, tenderness to palpation around the umbilicus and left lower quadrant, no rebound, no mass appreciated. RECTAL: Declines. MUSCULOSKELETAL: Full range of motion, normal gait, without edema. SKIN: Dry, intact without rashes or lesions. No jaundice. NEURO: Alert, oriented, no focal deficits. PSYCH: Cooperative, normal mood and affect.      Alan JONELLE Coombs, PA-C 11:12 AM

## 2024-03-21 ENCOUNTER — Ambulatory Visit (INDEPENDENT_AMBULATORY_CARE_PROVIDER_SITE_OTHER): Admitting: Physician Assistant

## 2024-03-21 ENCOUNTER — Ambulatory Visit: Payer: Self-pay | Admitting: Physician Assistant

## 2024-03-21 ENCOUNTER — Other Ambulatory Visit

## 2024-03-21 ENCOUNTER — Encounter: Payer: Self-pay | Admitting: Physician Assistant

## 2024-03-21 DIAGNOSIS — R1033 Periumbilical pain: Secondary | ICD-10-CM

## 2024-03-21 DIAGNOSIS — E538 Deficiency of other specified B group vitamins: Secondary | ICD-10-CM

## 2024-03-21 DIAGNOSIS — D509 Iron deficiency anemia, unspecified: Secondary | ICD-10-CM | POA: Diagnosis not present

## 2024-03-21 DIAGNOSIS — R112 Nausea with vomiting, unspecified: Secondary | ICD-10-CM | POA: Diagnosis not present

## 2024-03-21 DIAGNOSIS — K625 Hemorrhage of anus and rectum: Secondary | ICD-10-CM

## 2024-03-21 DIAGNOSIS — Z6841 Body Mass Index (BMI) 40.0 and over, adult: Secondary | ICD-10-CM

## 2024-03-21 DIAGNOSIS — C7A8 Other malignant neuroendocrine tumors: Secondary | ICD-10-CM

## 2024-03-21 LAB — COMPREHENSIVE METABOLIC PANEL WITH GFR
ALT: 10 U/L (ref 0–35)
AST: 9 U/L (ref 0–37)
Albumin: 4.3 g/dL (ref 3.5–5.2)
Alkaline Phosphatase: 82 U/L (ref 39–117)
BUN: 10 mg/dL (ref 6–23)
CO2: 29 meq/L (ref 19–32)
Calcium: 9 mg/dL (ref 8.4–10.5)
Chloride: 104 meq/L (ref 96–112)
Creatinine, Ser: 0.71 mg/dL (ref 0.40–1.20)
GFR: 115.06 mL/min (ref >60.00)
Glucose, Bld: 72 mg/dL (ref 70–99)
Potassium: 3.7 meq/L (ref 3.5–5.1)
Sodium: 140 meq/L (ref 135–145)
Total Bilirubin: 0.4 mg/dL (ref 0.2–1.2)
Total Protein: 7.1 g/dL (ref 6.0–8.3)

## 2024-03-21 LAB — C-REACTIVE PROTEIN: CRP: <1 mg/dL (ref 0.5–20.0)

## 2024-03-21 LAB — CBC WITH DIFFERENTIAL/PLATELET
Basophils Absolute: 0 K/uL (ref 0.0–0.1)
Basophils Relative: 0.6 % (ref 0.0–3.0)
Eosinophils Absolute: 0.1 K/uL (ref 0.0–0.7)
Eosinophils Relative: 1.1 % (ref 0.0–5.0)
HCT: 31.2 % — ABNORMAL LOW (ref 36.0–46.0)
Hemoglobin: 9.9 g/dL — ABNORMAL LOW (ref 12.0–15.0)
Lymphocytes Relative: 33.7 % (ref 12.0–46.0)
Lymphs Abs: 2.4 K/uL (ref 0.7–4.0)
MCHC: 31.7 g/dL (ref 30.0–36.0)
MCV: 75.1 fl — ABNORMAL LOW (ref 78.0–100.0)
Monocytes Absolute: 0.4 K/uL (ref 0.1–1.0)
Monocytes Relative: 5.5 % (ref 3.0–12.0)
Neutro Abs: 4.3 K/uL (ref 1.4–7.7)
Neutrophils Relative %: 59.1 % (ref 43.0–77.0)
Platelets: 367 K/uL (ref 150.0–400.0)
RBC: 4.16 Mil/uL (ref 3.87–5.11)
RDW: 17.1 % — ABNORMAL HIGH (ref 11.5–15.5)
WBC: 7.2 K/uL (ref 4.0–10.5)

## 2024-03-21 LAB — IBC + FERRITIN
Ferritin: 4.7 ng/mL — ABNORMAL LOW (ref 10.0–291.0)
Iron: 26 ug/dL — ABNORMAL LOW (ref 42–145)
Saturation Ratios: 6.1 % — ABNORMAL LOW (ref 20.0–50.0)
TIBC: 428.4 ug/dL (ref 250.0–450.0)
Transferrin: 306 mg/dL (ref 212.0–360.0)

## 2024-03-21 LAB — CORTISOL: Cortisol, Plasma: 6.4 ug/dL

## 2024-03-21 LAB — LIPASE: Lipase: 17 U/L (ref 11.0–59.0)

## 2024-03-21 LAB — VITAMIN B12: Vitamin B-12: 317 pg/mL (ref 211–911)

## 2024-03-21 LAB — SEDIMENTATION RATE: Sed Rate: 20 mm/h (ref 0–20)

## 2024-03-21 LAB — TSH: TSH: 1.88 u[IU]/mL (ref 0.35–5.50)

## 2024-03-21 MED ORDER — PROMETHAZINE HCL 25 MG PO TABS: 12.5000 mg | ORAL_TABLET | Freq: Four times a day (QID) | ORAL | 0 refills | Status: AC | PRN

## 2024-03-21 MED ORDER — HYDROCORTISONE ACETATE 25 MG RE SUPP: 25.0000 mg | Freq: Two times a day (BID) | RECTAL | 0 refills | Status: AC

## 2024-03-21 MED ORDER — NA SULFATE-K SULFATE-MG SULF 17.5-3.13-1.6 GM/177ML PO SOLN: 1.0000 | ORAL | 0 refills | Status: DC

## 2024-03-21 MED ORDER — HYDROCORTISONE (PERIANAL) 2.5 % EX CREA: 1.0000 | TOPICAL_CREAM | Freq: Two times a day (BID) | CUTANEOUS | 2 refills | Status: AC

## 2024-03-21 MED ORDER — FAMOTIDINE 40 MG PO TABS
40.0000 mg | ORAL_TABLET | Freq: Every day | ORAL | 1 refills | Status: AC
Start: 2024-03-21 — End: ?

## 2024-03-21 NOTE — Patient Instructions (Addendum)
 _______________________________________________________  If your blood pressure at your visit was 140/90 or greater, please contact your primary care physician to follow up on this.  _______________________________________________________  If you are age 29 or older, your body mass index should be between 23-30. Your Body mass index is 43.46 kg/m. If this is out of the aforementioned range listed, please consider follow up with your Primary Care Provider.  If you are age 65 or younger, your body mass index should be between 19-25. Your Body mass index is 43.46 kg/m. If this is out of the aformentioned range listed, please consider follow up with your Primary Care Provider.   ________________________________________________________  The [facility] providers would like to encourage you to use MYCHART to communicate with providers for non-urgent requests or questions.  Due to long hold times on the telephone, sending your provider a message by [facility] may be a faster and more efficient way to get a response.  Please allow 48 business hours for a response.  Please remember that this is for non-urgent requests.  _______________________________________________________  [facility] is using a team-based approach to care.  Your team is made up of your doctor and two to three APPS. Our APPS (Nurse Practitioners and Physician Assistants) work with your physician to ensure care continuity for you. They are fully qualified to address your health concerns and develop a treatment plan. They communicate directly with your gastroenterologist to care for you. Seeing the Advanced Practice Practitioners on your physician's team can help you by facilitating care more promptly, often allowing for earlier appointments, access to diagnostic testing, procedures, and other specialty referrals.   Your provider has requested that you go to the basement level for lab work before leaving today. Press B on the  elevator. The lab is located at the first door on the left as you exit the elevator.  Your provider has requested that you have an abdominal x ray before leaving today. Please go to the basement floor to our Radiology department for the test.  We have sent the following medications to your pharmacy for you to pick up at your convenience:  Famotidine , Anusol  rectal cream, Anusol  suppositories, Phenergan , Suprep  Suggest talking with PCP about sleep apnea  You have been scheduled for an endoscopy and colonoscopy. Please follow the written instructions given to you at your visit today.  If you use inhalers (even only as needed), please bring them with you on the day of your procedure.  DO NOT TAKE 7 DAYS PRIOR TO TEST- Trulicity (dulaglutide) Ozempic, Wegovy (semaglutide) Mounjaro (tirzepatide) Bydureon Bcise (exanatide extended release)  DO NOT TAKE 1 DAY PRIOR TO YOUR TEST Rybelsus (semaglutide) Adlyxin (lixisenatide) Victoza (liraglutide) Byetta (exanatide) ___________________________________________________________________________  FIBER SUPPLEMENT You can do metamucil or fibercon once or twice a day but if this causes gas/bloating please switch to Benefiber or Citracel.  Fiber is good for constipation/diarrhea/irritable bowel syndrome.  It can also help with weight loss and can help lower your bad cholesterol (LDL).  Please do 1 TBSP in the morning in water , coffee, or tea.  It can take up to a month before you can see a difference with your bowel movements.  It is cheapest from [employer], [employer], [employer].   Please do sitz baths- these can be found at the pharmacy. It is a Chief Operating Officer that is put in your toliet.  Please increase fiber or add benefiber, increase water  and increase acitivity.  Will send in hydrocoritsone suppository, cheapest with GOODRX from [employer], [employer], [employer] or [employer] if your insurance does not pay for it. If the hemorrhoid suppository sent in is  too expensive you can do this over the counter trick.  Apply a pea size amount of generic prescription Anusol  HC cream that has been sent into your pharmacy to the tip of an over the counter PrepH suppository and insert rectally once every night for at least 7 nights.  If this does not improve there are procedures that can be done.   About Hemorrhoids  Hemorrhoids are swollen veins in the lower rectum and anus.  Also called piles, hemorrhoids are a common problem.  Hemorrhoids may be internal (inside the rectum) or external (around the anus).  Internal Hemorrhoids  Internal hemorrhoids are often painless, but they rarely cause bleeding.  The internal veins may stretch and fall down (prolapse) through the anus to the outside of the body.  The veins may then become irritated and painful.  External Hemorrhoids  External hemorrhoids can be easily seen or felt around the anal opening.  They are under the skin around the anus.  When the swollen veins are scratched or broken by straining, rubbing or wiping they sometimes bleed.  How Hemorrhoids Occur  Veins in the rectum and around the anus tend to swell under pressure.  Hemorrhoids can result from increased pressure in the veins of your anus or rectum.  Some sources of pressure are:  Straining to have a bowel movement because of constipation Waiting too long to have a bowel movement Coughing and sneezing often Sitting for extended periods of time, including on the toilet Diarrhea Obesity Trauma or injury to the anus Some liver diseases Stress Family history of hemorrhoids Pregnancy  Pregnant women should try to avoid becoming constipated, because they are more likely to have hemorrhoids during pregnancy.  In the last trimester of pregnancy, the enlarged uterus may press on blood vessels and causes hemorrhoids.  In addition, the strain of childbirth sometimes causes hemorrhoids after the birth.  Symptoms of Hemorrhoids  Some symptoms of  hemorrhoids include: Swelling and/or a tender lump around the anus Itching, mild burning and bleeding around the anus Painful bowel movements with or without constipation Bright red blood covering the stool, on toilet paper or in the toilet bowel.   Symptoms usually go away within a few days.  Always talk to your doctor about any bleeding to make sure it is not from some other causes.  Diagnosing and Treating Hemorrhoids  Diagnosis is made by an examination by your healthcare provider.  Special test can be performed by your doctor.    Most cases of hemorrhoids can be treated with: High-fiber diet: Eat more high-fiber foods, which help prevent constipation.  Ask for more detailed fiber information on types and sources of fiber from your healthcare provider. Fluids: Drink plenty of water .  This helps soften bowel movements so they are easier to pass. Sitz baths and cold packs: Sitting in lukewarm water  two or three times a day for 15 minutes cleases the anal area and may relieve discomfort.  If the water  is too hot, swelling around the anus will get worse.  Placing a cloth-covered ice pack on the anus for ten minutes four times a day can also help reduce selling.  Gently pushing a prolapsed hemorrhoid back inside after the bath or ice pack can be helpful. Medications: For mild discomfort, your healthcare provider may suggest over-the-counter pain medication or prescribe a cream or ointment for topical use.  The cream  may contain witch hazel, zinc oxide or petroleum jelly.  Medicated suppositories are also a treatment option.  Always consult your doctor before applying medications or creams. Procedures and surgeries: There are also a number of procedures and surgeries to shrink or remove hemorrhoids in more serious cases.  Talk to your physician about these options.  You can often prevent hemorrhoids or keep them from becoming worse by maintaining a healthy lifestyle.  Eat a fiber-rich diet of  fruits, vegetables and whole grains.  Also, drink plenty of water  and exercise regularly.   2007, [employer] Doc.30  Gastroparesis Gastroparesis is a condition in which food takes longer than normal to empty from the stomach.  This condition is also known as delayed gastric emptying. It is usually a long-term (chronic) condition.  What are the signs or symptoms? Symptoms of this condition include: Feeling full after eating very little or a loss of appetite. Nausea, vomiting, or heartburn. Bloating of your abdomen. Inconsistent blood sugar (glucose) levels on blood tests. Unexplained weight loss. Acid from the stomach coming up into the esophagus (gastroesophageal reflux). Sudden tightening (spasm) of the stomach, which can be painful. Symptoms may come and go. Some people may not notice any symptoms.  What increases the risk? You are more likely to develop this condition if: You have certain disorders or diseases. These may include: An endocrine disorder. An eating disorder. Amyloidosis. Scleroderma. Parkinson's disease. Multiple sclerosis. Cancer or infection of the stomach or the vagus nerve. You have had surgery on your stomach or vagus nerve. You take certain medicines. You are female.  Things you can do: Please do small frequent meals like 4-6 meals a day.  Eat and drink liquids at separate times.  Avoid high fiber foods, cook your vegetables, avoid high fat food.  Suggest spreading protein throughout the day (greek yogurt, glucerna, soft meat, milk, eggs) Choose soft foods that you can mash with a fork When you are more symptomatic, change to pureed foods foods and liquids.  Consider reading Living well with Gastroparesis by [facility] Check out this link to a diet online https://my.GroupJournal.fr  Due to recent changes in healthcare laws, you may see  the results of your imaging and laboratory studies on MyChart before your provider has had a chance to review them.  We understand that in some cases there may be results that are confusing or concerning to you. Not all laboratory results come back in the same time frame and the provider may be waiting for multiple results in order to interpret others.  Please give us  48 hours in order for your provider to thoroughly review all the results before contacting the office for clarification of your results.   Thank you for entrusting me with your care and choosing [facility].  Alan Coombs, PA-C

## 2024-04-15 ENCOUNTER — Encounter: Payer: Self-pay | Admitting: Licensed Clinical Social Worker

## 2024-04-15 ENCOUNTER — Telehealth: Payer: Self-pay

## 2024-04-15 NOTE — Telephone Encounter (Signed)
 Spoke with patient and confirmed appointment on 9/30

## 2024-04-16 ENCOUNTER — Other Ambulatory Visit: Payer: Self-pay

## 2024-04-16 ENCOUNTER — Inpatient Hospital Stay: Attending: Hematology and Oncology | Admitting: Hematology and Oncology

## 2024-04-16 ENCOUNTER — Inpatient Hospital Stay

## 2024-04-16 VITALS — BP 131/69 | HR 83 | Temp 98.4°F | Resp 16 | Wt 233.8 lb

## 2024-04-16 DIAGNOSIS — D500 Iron deficiency anemia secondary to blood loss (chronic): Secondary | ICD-10-CM | POA: Diagnosis not present

## 2024-04-16 DIAGNOSIS — D508 Other iron deficiency anemias: Secondary | ICD-10-CM

## 2024-04-16 DIAGNOSIS — C7A8 Other malignant neuroendocrine tumors: Secondary | ICD-10-CM

## 2024-04-16 DIAGNOSIS — K922 Gastrointestinal hemorrhage, unspecified: Secondary | ICD-10-CM | POA: Diagnosis present

## 2024-04-16 LAB — CBC WITH DIFFERENTIAL/PLATELET
Abs Immature Granulocytes: 0.02 K/uL (ref 0.00–0.07)
Basophils Absolute: 0.1 K/uL (ref 0.0–0.1)
Basophils Relative: 1 %
Eosinophils Absolute: 0.1 K/uL (ref 0.0–0.5)
Eosinophils Relative: 1 %
HCT: 30.7 % — ABNORMAL LOW (ref 36.0–46.0)
Hemoglobin: 9.3 g/dL — ABNORMAL LOW (ref 12.0–15.0)
Immature Granulocytes: 0 %
Lymphocytes Relative: 30 %
Lymphs Abs: 2.5 K/uL (ref 0.7–4.0)
MCH: 23.2 pg — ABNORMAL LOW (ref 26.0–34.0)
MCHC: 30.3 g/dL (ref 30.0–36.0)
MCV: 76.6 fL — ABNORMAL LOW (ref 80.0–100.0)
Monocytes Absolute: 0.5 K/uL (ref 0.1–1.0)
Monocytes Relative: 6 %
Neutro Abs: 5.2 K/uL (ref 1.7–7.7)
Neutrophils Relative %: 62 %
Platelets: 359 K/uL (ref 150–400)
RBC: 4.01 MIL/uL (ref 3.87–5.11)
RDW: 16.5 % — ABNORMAL HIGH (ref 11.5–15.5)
WBC: 8.2 K/uL (ref 4.0–10.5)
nRBC: 0 % (ref 0.0–0.2)

## 2024-04-16 LAB — IRON AND IRON BINDING CAPACITY (CC-WL,HP ONLY)
Iron: 20 ug/dL — ABNORMAL LOW (ref 28–170)
Saturation Ratios: 5 % — ABNORMAL LOW (ref 10.4–31.8)
TIBC: 426 ug/dL (ref 250–450)
UIBC: 406 ug/dL (ref 148–442)

## 2024-04-16 LAB — FERRITIN: Ferritin: 6 ng/mL — ABNORMAL LOW (ref 11–307)

## 2024-04-16 NOTE — Assessment & Plan Note (Signed)
 Assessment and Plan Assessment & Plan

## 2024-04-16 NOTE — Progress Notes (Signed)
 [facility] CONSULT NOTE  Patient Care Team: [facility], [facility] as PCP - General Rudy, Carlin LABOR, MD as PCP - OBGYN (Obstetrics and Gynecology) Cathlyn JAYSON Cary, Bobie BRAVO, MD as Consulting Physician (Obstetrics and Gynecology)  CHIEF COMPLAINTS/PURPOSE OF CONSULTATION:  IDA  ASSESSMENT & PLAN:   Assessment and Plan Assessment & Plan Iron  deficiency anemia due to active gastrointestinal bleeding Iron  deficiency anemia secondary to active gastrointestinal bleeding, likely from internal hemorrhoids or other lesions. Oral iron  ineffective.  - Order iron  infusions at [facility] or [facility], weekly for 2-3 weeks. Iron  deficiency anemia affects a large proportion of the wellness population especially females of childbearing age and children.  Common causes of iron  deficiency include blood loss, reduced iron  absorption because of previous surgeries, dietary restrictions or other malabsorption issues, medications that reduce gastric acidity are due to inherited disorders such as IRIDA due to TMPRSS6 mutation. Symptoms of iron  deficiency usually include fatigue, pica, restless legs, exercise intolerance, exertional dyspnea, headaches and weakness. We have discussed about risk of allergic/infusion reactions including potentially life-threatening anaphylaxis with intravenous iron  however these serious allergic reactions are rare.  In contrast to serious allergic reactions, IV iron  may be associated with nonallergic infusion reactions including self-limited urticaria, palpitations, dizziness, neck and back spasm which again occur in less than 1% of the individuals and do not progress to more serious reactions.  - Advise continuation of oral iron  until infusions start. - Order labs for hemoglobin and iron  levels. - Coordinate with gastroenterologist for endoscopy and colonoscopy. - Refer to Child psychotherapist for housing and disability support. - Provide information on food  pantry resources.  HISTORY OF PRESENTING ILLNESS:  Nicole Mclean 28 y.o. female is here because of IDA  Discussed the use of AI scribe software for clinical note transcription with the patient, who gave verbal consent to proceed.  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  She experiences symptoms of iron  deficiency anemia, including dizziness, nausea, fatigue, and confusion. She frequently feels as though she is going to pass out and experiences extreme tiredness, stating, 'I feel like I can't really keep my head up during the day.' These symptoms have significantly impacted her ability to work, as she struggles to stay awake and alert.  She was diagnosed with anemia concurrently with her neuroendocrine tumor diagnosis in October of the previous year. She underwent a small bowel resection in January but continues to experience significant gastrointestinal bleeding. She reports passing bright red blood in her stool almost daily, with only one or two days without bleeding. This bleeding is described as sometimes being so severe that 'it's like I'm having a menstrual cycle.'  She is scheduled for an endoscopy and colonoscopy in November to determine the source of the bleeding. She has been taking iron  pills prescribed by her primary care provider, but they have not been effective in improving her anemia. She has been prescribed additional medications by a specialist but has not been able to purchase them due to financial constraints.  She has a history of alpha thalassemia, which has not been a significant issue unless there is substantial blood loss. She has not previously received iron  infusions.  She works for [employer] and has HR accommodations for her medical appointments, but does not receive pay for time off, affecting her housing  situation.  All  other systems were reviewed with the patient and are negative.  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   Preeclampsia    Syphilis    UTI (urinary tract infection)     SURGICAL HISTORY: Past Surgical History:  Procedure Laterality Date   CESAREAN SECTION N/A 01/17/2023   Procedure: CESAREAN SECTION;  Surgeon: Herchel Gloris LABOR, MD;  Location: [facility] ORS;  Service: Obstetrics;  Laterality: N/A;   Chin surgery     ESOPHAGOGASTRODUODENOSCOPY N/A 05/06/2023   Procedure: ESOPHAGOGASTRODUODENOSCOPY (EGD);  Surgeon: Saintclair Jasper, MD;  Location: [facility];  Service: Gastroenterology;  Laterality: N/A;   FLEXIBLE SIGMOIDOSCOPY N/A 10/11/2022   Procedure: FLEXIBLE SIGMOIDOSCOPY;  Surgeon: Charlanne Groom, MD;  Location: [facility] ENDOSCOPY;  Service: Gastroenterology;  Laterality: N/A;   GIVENS CAPSULE STUDY N/A 05/06/2023   Procedure: GIVENS CAPSULE STUDY;  Surgeon: Saintclair Jasper, MD;  Location: [facility] ENDOSCOPY;  Service: Gastroenterology;  Laterality: N/A;   HAND SURGERY     Left  hand   LAPAROSCOPIC SMALL BOWEL RESECTION N/A 07/05/2023   Procedure: LAPAROSCOPIC SMALL BOWEL RESECTION;  Surgeon: Aron Shoulders, MD;  Location: [facility] OR;  Service: General;  Laterality: N/A;    SOCIAL HISTORY: Social History   Socioeconomic History   Marital status: Single    Spouse name: Not on file   Number of children: 3   Years of education: Not on file   Highest education level: Not on file  Occupational History   Occupation: claims rep  Tobacco Use   Smoking status: Never    Passive exposure: Never   Smokeless tobacco: Never  Vaping Use   Vaping status: Never Used  Substance and Sexual Activity   Alcohol use: No    Alcohol/week: 0.0 standard  drinks of alcohol   Drug use: No   Sexual activity: Yes    Partners: Male    Birth control/protection: Patch  Other Topics Concern   Not on file  Social History Narrative   Not on file   Social Drivers of Health   Financial Resource Strain: Not on file  Food Insecurity: Food Insecurity Present (04/16/2024)   Hunger Vital Sign    Worried About Running Out of Food in the Last Year: Often true    Ran Out of Food in the Last Year: Often true  Transportation Needs: No Transportation Needs (04/16/2024)   PRAPARE - Administrator, Civil Service (Medical): No    Lack of Transportation (Non-Medical): No  Physical Activity: Not on file  Stress: Not on file  Social Connections: Unknown (11/29/2021)   Received from [facility]   Social Network    Social Network: Not on file  Intimate Partner Violence: Not At Risk (04/16/2024)   Humiliation, Afraid, Rape, and Kick questionnaire    Fear of Current or Ex-Partner: No    Emotionally Abused: No    Physically Abused: No    Sexually Abused: No    FAMILY HISTORY: Family History  Adopted: Yes  Problem Relation Age of Onset   Lung disease Mother    Depression Mother    Heart disease Father    Diabetes Father    Hypertension Father    Learning disabilities Neg Hx    Mental  illness Neg Hx    Mental retardation Neg Hx    Vision loss Neg Hx    Varicose Veins Neg Hx    Asthma Neg Hx     ALLERGIES:  has no known allergies.  MEDICATIONS:  Current Outpatient Medications  Medication Sig Dispense Refill   ACCRUFER 30 MG CAPS Take by mouth.     famotidine  (PEPCID ) 40 MG tablet Take 1 tablet (40 mg total) by mouth at bedtime. 30 tablet 1   fluconazole  (DIFLUCAN ) 150 MG tablet Take 1 tablet PO once. Repeat in 3 days if needed. 2 tablet 0   HALOETTE 0.12-0.015 MG/24HR vaginal ring 1 VAGINAL RING INTO THE VAGINA EVERY 4 WEEKS LEAVE IN PLACE FOR 3 WEEKS OF A 4-WEEK CYCLE     hydrocortisone  (ANUSOL -HC) 2.5 % rectal cream Place 1  Application rectally 2 (two) times daily. 30 g 2   hydrocortisone  (ANUSOL -HC) 25 MG suppository Place 1 suppository (25 mg total) rectally 2 (two) times daily. 12 suppository 0   Na Sulfate-K Sulfate-Mg Sulfate concentrate (SUPREP BOWEL PREP KIT) 17.5-3.13-1.6 GM/177ML SOLN Take 1 kit (354 mLs total) by mouth as directed. 324 mL 0   promethazine  (PHENERGAN ) 25 MG tablet Take 0.5 tablets (12.5 mg total) by mouth every 6 (six) hours as needed for nausea or vomiting. 60 tablet 0   No current facility-administered medications for this visit.     PHYSICAL EXAMINATION: ECOG PERFORMANCE STATUS: 0 - Asymptomatic  Vitals:   04/16/24 1038  BP: 131/69  Pulse: 83  Resp: 16  Temp: 98.4 F (36.9 C)  SpO2: 99%   Filed Weights   04/16/24 1038  Weight: 233 lb 12.8 oz (106.1 kg)    GENERAL:alert, no distress SKIN: skin color, texture, turgor are normal, no rashes or significant lesions EYES: normal, conjunctiva are pink and non-injected, sclera clear OROPHARYNX:no exudate, no erythema and lips, buccal mucosa, and tongue normal  NECK: supple, thyroid  normal size, non-tender, without nodularity LYMPH:  no palpable lymphadenopathy in the cervical, axillary  LUNGS: clear to auscultation and percussion with normal breathing effort HEART: regular rate & rhythm and no murmurs and no lower extremity edema ABDOMEN:abdomen soft, non-tender and normal bowel sounds Musculoskeletal:no cyanosis of digits and no clubbing  PSYCH: alert & oriented x 3 with fluent speech NEURO: no focal motor/sensory deficits  LABORATORY DATA:  I have reviewed the data as listed Lab Results  Component Value Date   WBC 8.2 04/16/2024   HGB 9.3 (L) 04/16/2024   HCT 30.7 (L) 04/16/2024   MCV 76.6 (L) 04/16/2024   PLT 359 04/16/2024     Chemistry      Component Value Date/Time   NA 140 03/21/2024 0939   NA 138 01/10/2023 0943   K 3.7 03/21/2024 0939   CL 104 03/21/2024 0939   CO2 29 03/21/2024 0939   BUN 10  03/21/2024 0939   BUN 8 01/10/2023 0943   CREATININE 0.71 03/21/2024 0939      Component Value Date/Time   CALCIUM 9.0 03/21/2024 0939   ALKPHOS 82 03/21/2024 0939   AST 9 03/21/2024 0939   ALT 10 03/21/2024 0939   BILITOT 0.4 03/21/2024 0939   BILITOT <0.2 01/10/2023 0943       RADIOGRAPHIC STUDIES: I have personally reviewed the radiological images as listed and agreed with the findings in the report. No results found.  All questions were answered. The patient knows to call the clinic with any problems, questions or concerns. I spent 45 minutes in the  care of this patient including H and P, review of records, counseling and coordination of care.     Amber Stalls, MD 04/16/2024 5:48 PM

## 2024-04-16 NOTE — Progress Notes (Signed)
 Food bag from [employer] given to pt. Pt states she has signed up for food stamps and will receive her first benefit 04/27/24. Referral entered for Michelle Zavala, LCSW

## 2024-04-17 ENCOUNTER — Inpatient Hospital Stay: Attending: Licensed Clinical Social Worker | Admitting: Licensed Clinical Social Worker

## 2024-04-17 NOTE — Progress Notes (Unsigned)
 [facility] Clinical Social Work  Initial Assessment   Nicole Mclean is a 28 y.o. year old female {[facility] accompanied / contacted by:25154}. Clinical Social Work was referred by {referral source:27110} for {[facility] csw reason for visit:33083}.   SDOH (Social Determinants of Health) assessments performed: {yes/no:20286} SDOH Interventions    Flowsheet Row Office Visit from 04/16/2024 in [facility] - A Dept Of [place]  SDOH Interventions   Food Insecurity Interventions AMB Referral, [facility] Food Pant[facility]    SDOH Screenings   Food Insecurity: Food Insecurity Present (04/16/2024)  Housing: High Risk (04/16/2024)  Transportation Needs: No Transportation Needs (04/16/2024)  Utilities: At Risk (04/16/2024)  Depression (PHQ2-9): Low Risk  (04/16/2024)  Social Connections: Unknown (11/29/2021)   Received from [facility]  Tobacco Use: Low Risk  (03/21/2024)    PHQ 2/9:    04/16/2024    2:12 PM 11/29/2022    9:43 AM 07/15/2022    8:43 AM  Depression screen PHQ 2/9  Decreased Interest 0 0 0  Down, Depressed, Hopeless 1 0 0  PHQ - 2 Score 1 0 0  Altered sleeping   0  Tired, decreased energy   0  Change in appetite   1  Feeling bad or failure about yourself    0  Trouble concentrating   0  Moving slowly or fidgety/restless   0  Suicidal thoughts   0  PHQ-9 Score   1     Distress Screen completed: {yes/no:20286}     No data to display            Family/Social Information:  Housing Arrangement: {[facility] housing:27476}*** Family members/support persons in your life? {[facility] support people:27477} Transportation concerns: {YES/NO/WILD RJMID:81418}  Employment: {Hx; Work history:210120514} ***.  Income source: {Source of Income:4043991513} Financial concerns: {Financial concern yes/no:24779} Type of concern: {Financial concern types:24780} Food access concerns: {YES/NO/WILD RJMID:81418} Religious or spiritual practice: {CH[facility] AMB YES/NO/NOT KNOWN  7:789869892} Advanced directives: {CH[facility] AMB YES/NO/NOT KNOWN 7:789869892} Services Currently in place:  ***  Coping/ Adjustment to diagnosis: Patient understands treatment plan and what happens next? {YES/NO/WILD RJMID:81418} Concerns about diagnosis and/or treatment: {[facility] worries:27479} Patient reported stressors: {[facility] Pt Stressors:24156} Hopes and/or priorities: *** Patient enjoys {Hobbies:24782} Current coping skills/ strengths: {PATIENT STRENGTHS:22666:a}    SUMMARY: Current SDOH Barriers:  {CCM SW BARRIERS:22256}  Clinical Social Work Clinical Goal(s):  {[facility] SW goals:27480}  Interventions: Discussed common feeling and emotions when being diagnosed with cancer, and the importance of support during treatment Informed patient of the support team roles and support services at [facility] Provided CSW contact information and encouraged patient to call with any questions or concerns {[facility] prog note intervention:33027}   Follow Up Plan: {CSW Follow le:75111} Patient verbalizes understanding of plan: {yes/no:20286}    Nicole Snipe E Deema Juncaj, LCSW Clinical Social Worker [facility]  Patient is participating in a Managed Medicaid Plan:  {MM YES/NO:27447::Yes}

## 2024-04-18 ENCOUNTER — Encounter: Payer: Self-pay | Admitting: Hematology and Oncology

## 2024-04-19 ENCOUNTER — Encounter: Payer: Self-pay | Admitting: Licensed Clinical Social Worker

## 2024-04-19 NOTE — Progress Notes (Signed)
 Pt approved for assistance through the [facility]. Pt notified.   Nicole Mclean E Chaeli Judy, LCSW

## 2024-04-19 NOTE — Progress Notes (Signed)
 [facility] Clinical Social Work  Received required forms from pt for [employer]. Request submitted. CSW will notify pt as status updates are received.   Nicole Mclean E Nicole Borman, LCSW

## 2024-04-22 ENCOUNTER — Telehealth: Payer: Self-pay | Admitting: Oncology

## 2024-04-22 ENCOUNTER — Other Ambulatory Visit: Payer: Self-pay | Admitting: Oncology

## 2024-04-22 ENCOUNTER — Telehealth: Payer: Self-pay

## 2024-04-22 ENCOUNTER — Other Ambulatory Visit

## 2024-04-22 ENCOUNTER — Ambulatory Visit: Admitting: Oncology

## 2024-04-22 DIAGNOSIS — D508 Other iron deficiency anemias: Secondary | ICD-10-CM

## 2024-04-22 DIAGNOSIS — D3A8 Other benign neuroendocrine tumors: Secondary | ICD-10-CM

## 2024-04-22 NOTE — Telephone Encounter (Signed)
 Called PT to schedule per secure chat.

## 2024-04-22 NOTE — Telephone Encounter (Signed)
 Followed up with patient due to missed Lab and Hematology appointment with Dr. Autumn. It was explained to patient that it was not realized that she was seen by Dr.Iruku at [facility] last week and has iron  infusions already set up. Patient understands that we would like to see her during the 1st or 2nd week of December for a 30-minute appointment along with lab appointment prior. Scheduler aware to reach out to patient to make new appointments.

## 2024-05-01 ENCOUNTER — Ambulatory Visit

## 2024-05-01 ENCOUNTER — Encounter: Payer: Self-pay | Admitting: Licensed Clinical Social Worker

## 2024-05-01 VITALS — BP 128/89 | HR 80 | Temp 98.7°F | Resp 18 | Ht 61.0 in | Wt 236.0 lb

## 2024-05-01 DIAGNOSIS — D500 Iron deficiency anemia secondary to blood loss (chronic): Secondary | ICD-10-CM

## 2024-05-01 DIAGNOSIS — K922 Gastrointestinal hemorrhage, unspecified: Secondary | ICD-10-CM | POA: Diagnosis not present

## 2024-05-01 DIAGNOSIS — D508 Other iron deficiency anemias: Secondary | ICD-10-CM

## 2024-05-01 MED ORDER — DIPHENHYDRAMINE HCL 25 MG PO CAPS
25.0000 mg | ORAL_CAPSULE | Freq: Once | ORAL | Status: AC
  Administered 2024-05-01: 25 mg via ORAL
  Filled 2024-05-01: qty 1

## 2024-05-01 MED ORDER — SODIUM CHLORIDE 0.9 % IV SOLN
300.0000 mg | INTRAVENOUS | Status: DC
  Administered 2024-05-01: 300 mg via INTRAVENOUS
  Filled 2024-05-01: qty 15

## 2024-05-01 MED ORDER — ACETAMINOPHEN 325 MG PO TABS
650.0000 mg | ORAL_TABLET | Freq: Once | ORAL | Status: AC
  Administered 2024-05-01: 650 mg via ORAL
  Filled 2024-05-01: qty 2

## 2024-05-01 NOTE — Progress Notes (Signed)
 Diagnosis: Iron  Deficiency Anemia  Provider:  Praveen Mannam MD  Procedure: IV Infusion  IV Type: Peripheral, IV Location: L Hand  Venofer (Iron  Sucrose), Dose: 300 mg  Infusion Start Time: 1102  Infusion Stop Time: 1256  Post Infusion IV Care: Observation period completed and Peripheral IV Discontinued  Discharge: Condition: Good, Destination: Home . AVS Declined  Performed by:  Garnet Overfield, RN

## 2024-05-01 NOTE — Progress Notes (Signed)
 [facility] CSW Progress Note  Clinical Child psychotherapist contacted patient by phone to follow-up on need for community resources.    Interventions: Lorrene funds from [employer] received and picked up by patient today    Patient reports that her apartment complex is working with her since assistance was approved. She is also starting short-term disability again which will allow for her to meet needs until Section 8 moves forward.  No other questions or concerns today.    Follow Up Plan:  Patient will contact CSW with any support or resource needs    Thomasenia Dowse E Giordano Getman, LCSW Clinical Social Worker [facility]    Patient is participating in a Managed Medicaid Plan:  Yes

## 2024-05-08 ENCOUNTER — Ambulatory Visit (INDEPENDENT_AMBULATORY_CARE_PROVIDER_SITE_OTHER)

## 2024-05-08 VITALS — BP 137/86 | HR 67 | Temp 98.0°F | Resp 16 | Ht 61.0 in | Wt 235.4 lb

## 2024-05-08 DIAGNOSIS — K922 Gastrointestinal hemorrhage, unspecified: Secondary | ICD-10-CM | POA: Diagnosis not present

## 2024-05-08 DIAGNOSIS — D508 Other iron deficiency anemias: Secondary | ICD-10-CM

## 2024-05-08 MED ORDER — ACETAMINOPHEN 325 MG PO TABS
650.0000 mg | ORAL_TABLET | Freq: Once | ORAL | Status: AC
  Administered 2024-05-08: 650 mg via ORAL
  Filled 2024-05-08: qty 2

## 2024-05-08 MED ORDER — DIPHENHYDRAMINE HCL 25 MG PO CAPS
25.0000 mg | ORAL_CAPSULE | Freq: Once | ORAL | Status: AC
  Administered 2024-05-08: 25 mg via ORAL
  Filled 2024-05-08: qty 1

## 2024-05-08 MED ORDER — SODIUM CHLORIDE 0.9 % IV SOLN
300.0000 mg | INTRAVENOUS | Status: DC
  Administered 2024-05-08: 300 mg via INTRAVENOUS
  Filled 2024-05-08: qty 15

## 2024-05-08 NOTE — Progress Notes (Signed)
 Diagnosis: Iron  Deficiency Anemia  Provider:  Praveen Mannam MD  Procedure: IV Infusion  IV Type: Peripheral, IV Location: R Hand  Venofer (Iron  Sucrose), Dose: 300 mg  Infusion Start Time: 1104  Infusion Stop Time: 1300  Post Infusion IV Care: Peripheral IV Discontinued  Discharge: Condition: Good, Destination: Home . AVS Declined  Performed by:  Leita FORBES Miles, LPN

## 2024-05-13 ENCOUNTER — Telehealth: Payer: Self-pay | Admitting: Gastroenterology

## 2024-05-13 NOTE — Telephone Encounter (Signed)
 Inbound call from patient stating she would like to speak to nurse in regards to medication her PCP put her on and if it would interfere with procedure on 11/13. Patient recently started weight loss medication. Requesting a call back Please advise  Thank you

## 2024-05-13 NOTE — Telephone Encounter (Signed)
 Pt stated that she was recently started on Wegovy for weight loss.  Pt was notified that she needed to hold the injection 7 days prior.  Dates were discussed with pt in detail. Last dose prior to procedure is 05/21/2024; pt to hold 11/11 /2025 dose.  Pt verbalized understanding with all questions answered.

## 2024-05-15 ENCOUNTER — Ambulatory Visit

## 2024-05-15 MED ORDER — ACETAMINOPHEN 325 MG PO TABS: 650.0000 mg | ORAL_TABLET | Freq: Once | ORAL | Status: DC

## 2024-05-15 MED ORDER — DIPHENHYDRAMINE HCL 25 MG PO CAPS: 25.0000 mg | ORAL_CAPSULE | Freq: Once | ORAL | Status: DC

## 2024-05-23 ENCOUNTER — Ambulatory Visit (INDEPENDENT_AMBULATORY_CARE_PROVIDER_SITE_OTHER)

## 2024-05-23 ENCOUNTER — Encounter: Payer: Self-pay | Admitting: Gastroenterology

## 2024-05-23 VITALS — BP 119/81 | HR 76 | Temp 98.6°F | Resp 18 | Ht 61.0 in | Wt 235.0 lb

## 2024-05-23 DIAGNOSIS — D508 Other iron deficiency anemias: Secondary | ICD-10-CM | POA: Diagnosis not present

## 2024-05-23 DIAGNOSIS — K922 Gastrointestinal hemorrhage, unspecified: Secondary | ICD-10-CM | POA: Diagnosis not present

## 2024-05-23 MED ORDER — SODIUM CHLORIDE 0.9 % IV SOLN
300.0000 mg | INTRAVENOUS | Status: DC
  Administered 2024-05-23: 300 mg via INTRAVENOUS
  Filled 2024-05-23: qty 15

## 2024-05-23 MED ORDER — DIPHENHYDRAMINE HCL 25 MG PO CAPS
25.0000 mg | ORAL_CAPSULE | Freq: Once | ORAL | Status: AC
  Administered 2024-05-23: 25 mg via ORAL
  Filled 2024-05-23: qty 1

## 2024-05-23 MED ORDER — ACETAMINOPHEN 325 MG PO TABS
650.0000 mg | ORAL_TABLET | Freq: Once | ORAL | Status: AC
  Administered 2024-05-23: 650 mg via ORAL
  Filled 2024-05-23: qty 2

## 2024-05-23 NOTE — Progress Notes (Signed)
 Diagnosis: Iron  Deficiency Anemia  Provider:  Praveen Mannam MD  Procedure: IV Infusion  IV Type: Peripheral, IV Location: L Antecubital  Venofer (Iron  Sucrose), Dose: 300 mg  Infusion Start Time: 0905  Infusion Stop Time: 1042  Post Infusion IV Care: Patient declined observation and Peripheral IV Discontinued  Discharge: Condition: Good, Destination: Home . AVS Declined  Performed by:  Leita FORBES Miles, LPN

## 2024-05-30 ENCOUNTER — Ambulatory Visit: Admitting: Gastroenterology

## 2024-05-30 ENCOUNTER — Encounter: Payer: Self-pay | Admitting: Gastroenterology

## 2024-05-30 VITALS — BP 145/88 | HR 63 | Temp 98.1°F | Resp 13 | Ht 61.0 in | Wt 230.0 lb

## 2024-05-30 DIAGNOSIS — D509 Iron deficiency anemia, unspecified: Secondary | ICD-10-CM

## 2024-05-30 DIAGNOSIS — K640 First degree hemorrhoids: Secondary | ICD-10-CM

## 2024-05-30 DIAGNOSIS — K6389 Other specified diseases of intestine: Secondary | ICD-10-CM | POA: Diagnosis not present

## 2024-05-30 DIAGNOSIS — K449 Diaphragmatic hernia without obstruction or gangrene: Secondary | ICD-10-CM | POA: Diagnosis not present

## 2024-05-30 DIAGNOSIS — K31A11 Gastric intestinal metaplasia without dysplasia, involving the antrum: Secondary | ICD-10-CM | POA: Diagnosis not present

## 2024-05-30 DIAGNOSIS — K317 Polyp of stomach and duodenum: Secondary | ICD-10-CM | POA: Diagnosis not present

## 2024-05-30 DIAGNOSIS — K5730 Diverticulosis of large intestine without perforation or abscess without bleeding: Secondary | ICD-10-CM | POA: Diagnosis present

## 2024-05-30 DIAGNOSIS — K2970 Gastritis, unspecified, without bleeding: Secondary | ICD-10-CM | POA: Diagnosis not present

## 2024-05-30 MED ORDER — OMEPRAZOLE 20 MG PO CPDR: 20.0000 mg | DELAYED_RELEASE_CAPSULE | Freq: Every day | ORAL | 3 refills | Status: AC

## 2024-05-30 MED ORDER — SODIUM CHLORIDE 0.9 % IV SOLN: 500.0000 mL | INTRAVENOUS | Status: DC

## 2024-05-30 MED ORDER — HYDROCORTISONE ACETATE 25 MG RE SUPP: 25.0000 mg | Freq: Two times a day (BID) | RECTAL | 0 refills | Status: AC

## 2024-05-30 NOTE — Op Note (Signed)
 [facility] Patient Name: Nicole Mclean Procedure Date: 05/30/2024 10:24 AM MRN: [id number] Endoscopist: Lynnie Bring , MD, [id number] Age: 28 Referring MD:  Date of Birth: June 07, 1996 Gender: Female Account #: [id number] Procedure:                Colonoscopy Indications:              1. Unexplained iron  deficiency anemia 2. History of                            rectal bleeding. 3. H/O Px Ileal NET s/p resection                            Dec 2024 by Dr. Aron Medicines:                Monitored Anesthesia Care Procedure:                Pre-Anesthesia Assessment:                           - Prior to the procedure, a History and Physical                            was performed, and patient medications and                            allergies were reviewed. The patient's tolerance of                            previous anesthesia was also reviewed. The risks                            and benefits of the procedure and the sedation                            options and risks were discussed with the patient.                            All questions were answered, and informed consent                            was obtained. Prior Anticoagulants: The patient has                            taken no anticoagulant or antiplatelet agents. ASA                            Grade Assessment: II - A patient with mild systemic                            disease. After reviewing the risks and benefits,                            the patient was deemed in satisfactory condition to  undergo the procedure.                           After obtaining informed consent, the colonoscope                            was passed under direct vision. Throughout the                            procedure, the patient's blood pressure, pulse, and                            oxygen saturations were monitored continuously. The                            CF [id number] #[id number] was introduced  through the anus                            and advanced to the 15 cm into the ileum. The                            colonoscopy was performed without difficulty. The                            patient tolerated the procedure well. The quality                            of the bowel preparation was good. The terminal                            ileum, ileocecal valve, appendiceal orifice, and                            rectum were photographed. Scope In: 10:57:03 AM Scope Out: 11:10:09 AM Scope Withdrawal Time: 0 hours 10 minutes 11 seconds  Total Procedure Duration: 0 hours 13 minutes 6 seconds  Findings:                 The colon (entire examined portion) appeared normal.                           A single large-mouthed diverticulum was found in                            the hepatic flexure.                           The terminal ileum appeared normal except for                            nodular hyperplasia. Approximately 15 cm of TI was                            intubated. The anastomosis was not reached.  Biopsies were taken with a cold forceps for                            histology.                           Non-bleeding internal hemorrhoids were found during                            retroflexion and during perianal exam. The                            hemorrhoids were moderate and Grade I (internal                            hemorrhoids that do not prolapse).                           Retroflexion in the right colon was performed.                           The exam was otherwise without abnormality on                            direct and retroflexion views. Complications:            No immediate complications. Estimated Blood Loss:     Estimated blood loss: none. Impression:               - The entire examined colon is normal.                           - Single diverticula in the hepatic flexure.                           - The examined portion of  the ileum was normal                            except for nodular hyperplasia. Biopsied.                           - Non-bleeding internal hemorrhoids.                           - The examination was otherwise normal on direct                            and retroflexion views. No active bleeding. Recommendation:           - Patient has a contact number available for                            emergencies. The signs and symptoms of potential                            delayed complications were discussed with the  patient. Return to normal activities tomorrow.                            Written discharge instructions were provided to the                            patient.                           - Resume previous diet.                           - Continue present medications.                           - Use hydrocortisone  suppository 25 mg 1 BID once a                            day for 10 days.                           - Await pathology results.                           - Repeat colonoscopy at age 70 as per current                            recommendations for colorectal cancer screening.                           - The findings and recommendations were discussed                            with the patient. Lynnie Bring, MD 05/30/2024 11:17:54 AM This report has been signed electronically.

## 2024-05-30 NOTE — Patient Instructions (Addendum)
 Resume previous diet.  Continue present medications.  Await pathology results.   Use hydrocortisone  suppository 25mg  twice a day for 10 days. Prescription sent.   Use Omeprazole  20mg  daily. Take 30 minutes before eating and drinking. Prescription sent.   YOU HAD AN ENDOSCOPIC PROCEDURE TODAY AT THE  ENDOSCOPY CENTER:   Refer to the procedure report that was given to you for any specific questions about what was found during the examination.  If the procedure report does not answer your questions, please call your gastroenterologist to clarify.  If you requested that your care partner not be given the details of your procedure findings, then the procedure report has been included in a sealed envelope for you to review at your convenience later.  YOU SHOULD EXPECT: Some feelings of bloating in the abdomen. Passage of more gas than usual.  Walking can help get rid of the air that was put into your GI tract during the procedure and reduce the bloating. If you had a lower endoscopy (such as a colonoscopy or flexible sigmoidoscopy) you may notice spotting of blood in your stool or on the toilet paper. If you underwent a bowel prep for your procedure, you may not have a normal bowel movement for a few days.  Please Note:  You might notice some irritation and congestion in your nose or some drainage.  This is from the oxygen used during your procedure.  There is no need for concern and it should clear up in a day or so.  SYMPTOMS TO REPORT IMMEDIATELY:  Following lower endoscopy (colonoscopy or flexible sigmoidoscopy):  Excessive amounts of blood in the stool  Significant tenderness or worsening of abdominal pains  Swelling of the abdomen that is new, acute  Fever of 100F or higher  Following upper endoscopy (EGD)  Vomiting of blood or coffee ground material  New chest pain or pain under the shoulder blades  Painful or persistently difficult swallowing  New shortness of breath  Fever of  100F or higher  Black, tarry-looking stools  For urgent or emergent issues, a gastroenterologist can be reached at any hour by calling [phone]. Do not use MyChart messaging for urgent concerns.    DIET:  We do recommend a small meal at first, but then you may proceed to your regular diet.  Drink plenty of fluids but you should avoid alcoholic beverages for 24 hours.  ACTIVITY:  You should plan to take it easy for the rest of today and you should NOT DRIVE or use heavy machinery until tomorrow (because of the sedation medicines used during the test).    FOLLOW UP: Our staff will call the number listed on your records the next business day following your procedure.  We will call around 7:15- 8:00 am to check on you and address any questions or concerns that you may have regarding the information given to you following your procedure. If we do not reach you, we will leave a message.     If any biopsies were taken you will be contacted by phone or by letter within the next 1-3 weeks.  Please call us  at [phone] if you have not heard about the biopsies in 3 weeks.    SIGNATURES/CONFIDENTIALITY: You and/or your care partner have signed paperwork which will be entered into your electronic medical record.  These signatures attest to the fact that that the information above on your After Visit Summary has been reviewed and is understood.  Full responsibility of the  confidentiality of this discharge information lies with you and/or your care-partner.

## 2024-05-30 NOTE — Progress Notes (Signed)
 Called to room to assist during endoscopic procedure.  Patient ID and intended procedure confirmed with present staff. Received instructions for my participation in the procedure from the performing physician.

## 2024-05-30 NOTE — Progress Notes (Signed)
 Vss nad trans to pacu

## 2024-05-30 NOTE — Progress Notes (Signed)
 03/21/2024 Nicole Mclean [id number] August 12, 1995   Referring provider: Center, [facility] Primary GI doctor: Dr. Charlanne   ASSESSMENT AND PLAN:  Rectal bleeding, golf ball size clots, loose stools to formed due to bowel resection, no straining 10/11/2022 flexible sigmoidoscopy at [facility] for hematochezia while patient was 21-week pregnant showed grade 2 moderate hemorrhoids with wale sign no specimens collected Declines rectal exam - Prescribe suppositories for internal hemorrhoids. - Provided information on hemorrhoid management. - Consider hemorrhoidal banding if bleeding persists after ruling out other causes with colonoscopy - Advise use of a squatty potty to ease bowel movements, consider pelvic floor PT. -Schedule colonoscopy with EGD, We have discussed the risks of bleeding, infection, perforation, medication reactions, and remote risk of death associated with colonoscopy. All questions were answered and the patient acknowledges these risk and wishes to proceed.   Periumblical AB pain Intermittent crampy pain, worse post prandial or with a BM Recent PET scan negative -Check KUB - consider starting medication for constipation   Nausea with vomiting, denies GERD Can vomit in her sleep 4-5 times since April Wakes up tired, snores at night Nausea in the morning, denies GERD - check for sleep apnea with vomiting at night -TSH, Cortisol, lipase, CBC, CMET -Obtain KUB - promethazine  and pepcid  given to take at night - schedule EGD with colonoscopy, I discussed risks of EGD with patient today, including risk of sedation, bleeding or perforation.  Patient provides understanding and gave verbal consent to proceed. 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 Aron 05/06/2023 EGD Dr. Herschell at [place] for melena and IDA was unremarkable VCE showed active  bleeding and recent bleeding noted in small bowel recommended double-balloon enteroscopy Transferred to [facility] where patient had double balloon enteroscopy 05/17/2023 the proximal ileum, showed well-differentiated neuroendocrine tumor 07/05/2023 laparoscopic small bowel resection final path well-differentiated neuroendocrine tumor pT3 N1 03/04/2024 chromogranin A less than 20 kidney and liver normal, Hgb 9.5 MCV 79 no leukocytosis 03/11/2024 PET scan no evidence of neuroendocrine tumor recurrence or metastasis   Anemia in patient with history of alpha thalassemia Symptomatic with dizziness, fatigue Has menses monthly, last 6 days, moderate to heavy with tampon replacement every 2-3 hours On Accufer once daily since april 03/04/2024 Hgb 9.5, MCV 79 Recent Labs (within last 365 days)              Recent Labs    07/05/23 1623 07/06/23 0631 07/08/23 0556 07/09/23 0627 07/10/23 0425 07/11/23 0609 07/11/23 1733 07/12/23 0528 07/13/23 0641 07/15/23 0612  HGB 9.7* 8.5* 7.7* 8.1* 7.9* 7.6* 7.9* 7.3* 7.5* 7.9*    - check iron , ferritin, B12  - consider IV iron  if iron  is still low despite Accufer daily   Morbid obesity  Body mass index is 43.46 kg/m.  -Patient has been advised to make an attempt to improve diet and exercise patterns to aid in weight loss. -Recommended diet heavy in fruits and veggies and low in animal meats, cheeses, and dairy products, appropriate calorie intake     Patient Care Team: Center, [facility] as PCP - General Rudy, Carlin LABOR, MD as PCP - OBGYN (Obstetrics and Gynecology) Cathlyn JAYSON Cary, Bobie FORBES, MD as Consulting Physician (Obstetrics and Gynecology)   HISTORY OF PRESENT ILLNESS: 28 y.o. female with a past medical history listed below presents for evaluation of rectal bleeding.    Discussed the use  of AI scribe software for clinical note transcription with the patient, who gave verbal consent to proceed.   History of Present Illness   Nicole E  Mclean is a 28 year old female with a history of neuroendocrine tumor who presents with rectal bleeding.   She has experienced persistent rectal bleeding since her pregnancy, described as significant and sometimes requiring the use of a pad, with the passage of golf ball-sized clots. The bleeding occurs without straining and can accompany both regular stools and diarrhea. A flexible sigmoidoscopy during pregnancy attributed the bleeding to hemorrhoids, but symptoms have persisted post-pregnancy and post-resection of a neuroendocrine tumor in December 2024.   She experiences frequent nausea, exacerbated by certain smells, leading to vomiting, sometimes occurring during sleep. The nausea is constant, and Zofran  has been ineffective, causing diarrhea instead. Promethazine  provided some relief when used previously.   Abdominal pain is primarily around the umbilical region, radiating to either side of the abdomen but not downward. The pain is described as a muscle ache that persists even when not severe, worsening with eating and bowel movements. Diarrhea exacerbates her symptoms, including pain, bleeding, and nausea.   She experiences significant fatigue, feeling exhausted despite adequate sleep, and reports dizziness and weakness. Her hemoglobin remains low at 8, despite taking an iron  supplement once daily since April or May 2025, with no improvement in anemia symptoms.   Her menstrual periods have become heavier since childbirth, lasting about six days and requiring the use of Super Plus tampons, which need changing every two to three hours. No significant weight loss, fever, or heartburn, but she does experience occasional chills.   She was adopted and is unaware of any family history of autoimmune diseases. She works Monday through Friday at [employer] and has had to manage her symptoms while maintaining her job. She was previously on short-term disability from June 2024 to March 2025.        She  reports that she has never smoked. She has never been exposed to tobacco smoke. She has never used smokeless tobacco. She reports that she does not drink alcohol and does not use drugs.   RELEVANT GI HISTORY, IMAGING AND LABS: Results   LABS Hb: 8   RADIOLOGY PET scan: No recurrence or metastasis   DIAGNOSTIC Flexible sigmoidoscopy: Moderate internal hemorrhoids, large and bleeding       CBC Labs (Brief)          Component Value Date/Time    WBC 6.0 07/15/2023 0612    RBC 3.11 (L) 07/15/2023 0612    HGB 7.9 (L) 07/15/2023 0612    HGB 10.9 (L) 01/10/2023 0943    HCT 25.8 (L) 07/15/2023 0612    HCT 33.2 (L) 01/10/2023 0943    PLT 383 07/15/2023 0612    PLT 254 01/10/2023 0943    MCV 83.0 07/15/2023 0612    MCV 86 01/10/2023 0943    MCH 25.4 (L) 07/15/2023 0612    MCHC 30.6 07/15/2023 0612    RDW 14.3 07/15/2023 0612    RDW 13.8 01/10/2023 0943    LYMPHSABS 2.0 07/13/2023 0641    LYMPHSABS 2.0 12/28/2022 1052    MONOABS 0.3 07/13/2023 0641    EOSABS 0.1 07/13/2023 0641    EOSABS 0.1 12/28/2022 1052    BASOSABS 0.1 07/13/2023 0641    BASOSABS 0.0 12/28/2022 1052      Recent Labs (within last 365 days)  Recent Labs    07/05/23 1623 07/06/23 0631 07/08/23 0556 07/09/23 0627 07/10/23 0425 07/11/23 0609 07/11/23 1733 07/12/23 0528 07/13/23 0641 07/15/23 0612  HGB 9.7* 8.5* 7.7* 8.1* 7.9* 7.6* 7.9* 7.3* 7.5* 7.9*        CMP     Labs (Brief)          Component Value Date/Time    NA 139 07/15/2023 0612    NA 138 01/10/2023 0943    K 4.1 07/15/2023 0612    CL 104 07/15/2023 0612    CO2 26 07/15/2023 0612    GLUCOSE 112 (H) 07/15/2023 0612    BUN 6 07/15/2023 0612    BUN 8 01/10/2023 0943    CREATININE 0.89 07/15/2023 0612    CALCIUM 9.2 07/15/2023 0612    PROT 7.3 05/18/2023 0022    PROT 5.4 (L) 01/10/2023 0943    ALBUMIN  4.0 05/18/2023 0022    ALBUMIN  3.3 (L) 01/10/2023 0943    AST 18 05/18/2023 0022    ALT 26 05/18/2023 0022     ALKPHOS 102 05/18/2023 0022    BILITOT 0.4 05/18/2023 0022    BILITOT <0.2 01/10/2023 0943    GFRNONAA >60 07/15/2023 0612    GFRAA >60 11/12/2018 2205          Latest Ref Rng & Units 05/18/2023   12:22 AM 05/06/2023   10:13 AM 05/04/2023    8:11 PM  Hepatic Function  Total Protein 6.5 - 8.1 g/dL 7.3  6.1  7.1   Albumin  3.5 - 5.0 g/dL 4.0  3.2  3.9   AST 15 - 41 U/L 18  13  15    ALT 0 - 44 U/L 26  17  21    Alk Phosphatase 38 - 126 U/L 102  77  96   Total Bilirubin 0.3 - 1.2 mg/dL 0.4  0.5  0.3       Current Medications:    Current Outpatient Medications (Endocrine & Metabolic):    HALOETTE 0.12-0.015 MG/24HR vaginal ring, 1 VAGINAL RING INTO THE VAGINA EVERY 4 WEEKS LEAVE IN PLACE FOR 3 WEEKS OF A 4-WEEK CYCLE     Current Outpatient Medications (Respiratory):    promethazine  (PHENERGAN ) 25 MG tablet, Take 0.5 tablets (12.5 mg total) by mouth every 6 (six) hours as needed for nausea or vomiting.     Current Outpatient Medications (Hematological):    ACCRUFER 30 MG CAPS, Take by mouth.   Current Outpatient Medications (Other):    famotidine  (PEPCID ) 40 MG tablet, Take 1 tablet (40 mg total) by mouth at bedtime.   hydrocortisone  (ANUSOL -HC) 2.5 % rectal cream, Place 1 Application rectally 2 (two) times daily.   hydrocortisone  (ANUSOL -HC) 25 MG suppository, Place 1 suppository (25 mg total) rectally 2 (two) times daily.   Na Sulfate-K Sulfate-Mg Sulfate concentrate (SUPREP BOWEL PREP KIT) 17.5-3.13-1.6 GM/177ML SOLN, Take 1 kit (354 mLs total) by mouth as directed.   fluconazole  (DIFLUCAN ) 150 MG tablet, Take 1 tablet PO once. Repeat in 3 days if needed.   Medical History:      Past Medical History:  Diagnosis Date   Bleeding hemorrhoid 10/10/2022    Internal hemorrhoids   Chlamydia 01/2016   Gonorrhea      2015 and then 2024, both during pregnancy   Hypertension     Iron  deficiency anemia 04/30/2015   Neuroendocrine tumor      in small bowel   Preeclampsia      Syphilis     UTI (urinary tract  infection)          Allergies:  Allergies  No Known Allergies      Surgical History:  She  has a past surgical history that includes Hand surgery; Chin surgery; Flexible sigmoidoscopy (N/A, 10/11/2022); Cesarean section (N/A, 01/17/2023); Esophagogastroduodenoscopy (N/A, 05/06/2023); Givens capsule study (N/A, 05/06/2023); and Laparoscopic small bowel resection (N/A, 07/05/2023). Family History:  Her family history includes Depression in her mother; Diabetes in her father; Heart disease in her father; Hypertension in her father; Lung disease in her mother. She was adopted.   REVIEW OF SYSTEMS  : All other systems reviewed and negative except where noted in the History of Present Illness.   PHYSICAL EXAM: BP 122/62   Pulse 92   Ht 5' 1 (1.549 m)   Wt 230 lb (104.3 kg)   BMI 43.46 kg/m  Physical Exam   GENERAL APPEARANCE: Well nourished, in no apparent distress. HEENT: No cervical lymphadenopathy, unremarkable thyroid , sclerae anicteric, conjunctiva pink. RESPIRATORY: Respiratory effort normal, breath sounds equal bilaterally without rales, rhonchi, or wheezing. CARDIO: Regular rate and rhythm with no murmurs, rubs, or gallops, peripheral pulses intact. ABDOMEN: Soft, non-distended, active bowel sounds in all four quadrants, tenderness to palpation around the umbilicus and left lower quadrant, no rebound, no mass appreciated. RECTAL: Declines. MUSCULOSKELETAL: Full range of motion, normal gait, without edema. SKIN: Dry, intact without rashes or lesions. No jaundice. NEURO: Alert, oriented, no focal deficits. PSYCH: Cooperative, normal mood and affect.       Alan JONELLE Coombs, PA-C    Attending physician's note   I have taken history, reviewed the chart and examined the patient. I performed a substantive portion of this encounter, including complete performance of at least one of the key components, in conjunction with the APP. I agree with the  Advanced Practitioner's note, impression and recommendations.   For EGD/colon today for IDA.  Also with alpha thalassemia trait and menorrhagia.  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 Aron. Path:   Negative surgical margins, + 1 out of 16 LN, +  Lymphovascular invasion and perineural invasion.  Ki-67 index 1%, low mitotic rate.   Negative recent PET scan for any recurrence.   Anselm Bring, MD [facility] [phone]

## 2024-05-30 NOTE — Op Note (Addendum)
 [facility] Patient Name: Nicole Mclean Procedure Date: 05/30/2024 10:33 AM MRN: [id number] Endoscopist: Lynnie Bring , MD, [id number] Age: 28 Referring MD:  Date of Birth: Jun 10, 1996 Gender: Female Account #: [id number] Procedure:                Upper GI endoscopy Indications:              1. Epigastric abdominal pain with intermittent                            nausea/vomiting. 2. H/O Px Ileal NET s/p resection                            Dec 2024 by Dr. Aron 3. IDA -but history of                            menorrhagia, rectal bleeding and alpha thalassemia                            trait Medicines:                Monitored Anesthesia Care Procedure:                Pre-Anesthesia Assessment:                           - Prior to the procedure, a History and Physical                            was performed, and patient medications and                            allergies were reviewed. The patient's tolerance of                            previous anesthesia was also reviewed. The risks                            and benefits of the procedure and the sedation                            options and risks were discussed with the patient.                            All questions were answered, and informed consent                            was obtained. Prior Anticoagulants: The patient has                            taken no anticoagulant or antiplatelet agents. ASA                            Grade Assessment: II - A patient with mild systemic  disease. After reviewing the risks and benefits,                            the patient was deemed in satisfactory condition to                            undergo the procedure.                           After obtaining informed consent, the endoscope was                            passed under direct vision. Throughout the                            procedure, the patient's blood pressure, pulse, and                             oxygen saturations were monitored continuously. The                            GIF [id number] #[id number] was introduced through the                            mouth, and advanced to the second part of duodenum.                            The upper GI endoscopy was accomplished without                            difficulty. The patient tolerated the procedure                            well. Scope In: Scope Out: Findings:                 The examined esophagus was normal with well-defined                            Z-line at 35 cm, examined by NBI. A small 1 cm                            hiatal hernia was noted.                           Localized mild inflammation characterized by                            erythema was found in the gastric antrum. Biopsies                            were taken with a cold forceps for histology.                           A single 12 mm sessile  polyp with no bleeding was                            found in the second portion of the duodenum, along                            the posterior border, away from and not involving                            major papilla. Biopsies were taken with a cold                            forceps for histology, some with tunnel technique.                            Estimated blood loss: none. Complications:            No immediate complications. Estimated Blood Loss:     Estimated blood loss: none. Impression:               - Minimal HH.                           - Gastritis. Biopsied.                           - A single duodenal polyp. Biopsied. Recommendation:           - Patient has a contact number available for                            emergencies. The signs and symptoms of potential                            delayed complications were discussed with the                            patient. Return to normal activities tomorrow.                            Written discharge instructions were provided  to the                            patient.                           - Resume previous diet.                           - Change famotidine  to omeprazole  20 mg p.o. daily                            #90. If still with problems, can take famotidine  at                            bedtime.                           -  Await pathology results.                           - Avoid nonsteroidals.                           - FU if still with problems.                           - The findings and recommendations were discussed                            with the patient's family. Lynnie Bring, MD 05/30/2024 11:24:29 AM This report has been signed electronically.

## 2024-05-31 ENCOUNTER — Telehealth: Payer: Self-pay

## 2024-05-31 NOTE — Telephone Encounter (Signed)
  Follow up Call-     05/30/2024   10:06 AM  Call back number  Post procedure Call Back phone  # [phone]  Permission to leave phone message Yes     Patient questions:  Do you have a fever, pain , or abdominal swelling? No. Pain Score  0 *  Have you tolerated food without any problems? Yes.    Have you been able to return to your normal activities? Yes.    Do you have any questions about your discharge instructions: Diet   No. Medications  No. Follow up visit  No.  Do you have questions or concerns about your Care? No.  Actions: * If pain score is 4 or above: No action needed, pain <4.

## 2024-06-04 LAB — SURGICAL PATHOLOGY

## 2024-06-11 ENCOUNTER — Telehealth: Payer: Self-pay | Admitting: Gastroenterology

## 2024-06-11 NOTE — Telephone Encounter (Signed)
 Pt made aware of recent results. Pt verbalized understanding with all questions answered.   Pt stated that she has periods of rectal bleeding from her hemorrhoids which can be heavy at times.Pt stated that she recently had an office visit with Alan Coombs PA who mentioned Hemorrhoid Banding after her colonoscopy.  Please advise if this pt would be a candidate for hemorrhoid banding after colonoscopy.

## 2024-06-11 NOTE — Telephone Encounter (Signed)
 All Bx neg Neg for HP, small bowel biopsies were negative for celiac. Gastric biopsies with focal intestinal metaplasia-which is totally benign. She will be getting a letter shortly. RG

## 2024-06-11 NOTE — Telephone Encounter (Signed)
 Pt calling for pathology results from recent procedure done on 05/30/2024.  Please review and advise.

## 2024-06-11 NOTE — Telephone Encounter (Signed)
 Inbound call from patient requesting to speak to someone in regards to her pathology report from her Colon and Endo that was done the 13 th of November. Patient is requesting a call back.Please advise.

## 2024-06-12 ENCOUNTER — Ambulatory Visit: Payer: Self-pay | Admitting: Gastroenterology

## 2024-06-12 NOTE — Telephone Encounter (Signed)
 For now lets try Anusol  HC suppositories 1 twice daily after the bowel movement for 7 to 10 days as needed If she still has problems in the future, can always consider banding RG

## 2024-06-17 ENCOUNTER — Inpatient Hospital Stay: Attending: Licensed Clinical Social Worker | Admitting: Oncology

## 2024-06-17 ENCOUNTER — Inpatient Hospital Stay

## 2024-06-17 VITALS — BP 115/68 | HR 75 | Temp 97.9°F | Resp 16 | Wt 233.4 lb

## 2024-06-17 DIAGNOSIS — M533 Sacrococcygeal disorders, not elsewhere classified: Secondary | ICD-10-CM | POA: Insufficient documentation

## 2024-06-17 DIAGNOSIS — D3A8 Other benign neuroendocrine tumors: Secondary | ICD-10-CM

## 2024-06-17 DIAGNOSIS — K649 Unspecified hemorrhoids: Secondary | ICD-10-CM | POA: Diagnosis not present

## 2024-06-17 DIAGNOSIS — C7A8 Other malignant neuroendocrine tumors: Secondary | ICD-10-CM | POA: Insufficient documentation

## 2024-06-17 DIAGNOSIS — D508 Other iron deficiency anemias: Secondary | ICD-10-CM

## 2024-06-17 DIAGNOSIS — D500 Iron deficiency anemia secondary to blood loss (chronic): Secondary | ICD-10-CM | POA: Diagnosis not present

## 2024-06-17 LAB — IRON AND TIBC
Iron: 59 ug/dL (ref 28–170)
Saturation Ratios: 18 % (ref 10.4–31.8)
TIBC: 337 ug/dL (ref 250–450)
UIBC: 278 ug/dL

## 2024-06-17 LAB — CBC WITH DIFFERENTIAL (CANCER CENTER ONLY)
Abs Immature Granulocytes: 0.01 K/uL (ref 0.00–0.07)
Basophils Absolute: 0 K/uL (ref 0.0–0.1)
Basophils Relative: 1 %
Eosinophils Absolute: 0.1 K/uL (ref 0.0–0.5)
Eosinophils Relative: 1 %
HCT: 36.3 % (ref 36.0–46.0)
Hemoglobin: 11.7 g/dL — ABNORMAL LOW (ref 12.0–15.0)
Immature Granulocytes: 0 %
Lymphocytes Relative: 37 %
Lymphs Abs: 2.3 K/uL (ref 0.7–4.0)
MCH: 26.5 pg (ref 26.0–34.0)
MCHC: 32.2 g/dL (ref 30.0–36.0)
MCV: 82.3 fL (ref 80.0–100.0)
Monocytes Absolute: 0.3 K/uL (ref 0.1–1.0)
Monocytes Relative: 4 %
Neutro Abs: 3.6 K/uL (ref 1.7–7.7)
Neutrophils Relative %: 57 %
Platelet Count: 298 K/uL (ref 150–400)
RBC: 4.41 MIL/uL (ref 3.87–5.11)
RDW: 18.5 % — ABNORMAL HIGH (ref 11.5–15.5)
WBC Count: 6.3 K/uL (ref 4.0–10.5)
nRBC: 0 % (ref 0.0–0.2)

## 2024-06-17 LAB — CMP (CANCER CENTER ONLY)
ALT: 28 U/L (ref 0–44)
AST: 16 U/L (ref 15–41)
Albumin: 4.5 g/dL (ref 3.5–5.0)
Alkaline Phosphatase: 87 U/L (ref 38–126)
Anion gap: 9 (ref 5–15)
BUN: 12 mg/dL (ref 6–20)
CO2: 26 mmol/L (ref 22–32)
Calcium: 9.6 mg/dL (ref 8.9–10.3)
Chloride: 104 mmol/L (ref 98–111)
Creatinine: 0.67 mg/dL (ref 0.44–1.00)
GFR, Estimated: >60 mL/min (ref >60)
Glucose, Bld: 91 mg/dL (ref 70–99)
Potassium: 4 mmol/L (ref 3.5–5.1)
Sodium: 139 mmol/L (ref 135–145)
Total Bilirubin: 0.3 mg/dL (ref 0.0–1.2)
Total Protein: 7.4 g/dL (ref 6.5–8.1)

## 2024-06-17 LAB — LACTATE DEHYDROGENASE: LDH: 140 U/L (ref 105–235)

## 2024-06-17 LAB — FERRITIN: Ferritin: 98 ng/mL (ref 11–307)

## 2024-06-17 NOTE — Telephone Encounter (Signed)
 Pt made aware of Dr. Charlanne recommendations.  Pt stated that she has already tried this as Alan Coombs PA prescribed her both external cream and internal HC suppositories and last office visit  and she had no relief with them prior. Please review and advise  if OK to scheduled banding.

## 2024-06-17 NOTE — Progress Notes (Signed)
 "  Mclean Mclean, Mclean Mclean, Mclean Mclean, Mclean Mclean, Mclean BRAVO, Mclean as Consulting Physician (Obstetrics and Gynecology)  PATIENT NAME: Mclean Mclean   MR#: [id number] DOB: [birth year]  Date of visit: 06/17/2024   ASSESSMENT & PLAN:   Mclean Mclean is a 28 y.o. 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  On surveillance.  Primary malignant neuroendocrine neoplasm of ileum [facility]) Please review oncology history for additional details and timeline of events.  Patient underwent laparoscopic small bowel resection 07/05/2023. 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  1.1 cm in maximal dimension.  There was evidence of lymphovascular invasion and perineural invasion.  Margins negative for tumor.  1 out of 16 lymph nodes were positive for neuroendocrine tumor.  Ki-67 low at 1%.  Mitotic rate <2 mitoses/HPF. pT3N1cM0.    On 09/01/2023, restaging PET dotatate scan showed no signs of tracer avid locally recurrent tumor or metastatic disease.  On 03/11/2024, restaging PET dotatate scan showed no evidence of well differentiated neuroendocrine tumor recurrence or metastasis. Stable postsurgical change of the small bowel.  No carcinoid symptoms.  Discussed diagnosis, prognosis, plan of care, treatment options.  Reviewed NCCN guidelines.    Since she is asymptomatic, we can continue surveillance alone for her well-differentiated neuroendocrine tumor.  She experiences intermittent flushing and dizziness, which may be related to anemia or residual effects of the tumor. No diarrhea or chest palpitations reported.  - Ordered 24-hour urine 5-HIAA to assess for chemical  release from potential microscopic disease - Provided list of foods to avoid for 24-hour urine collection test  I will plan to see her in 6 weeks for follow-up.    Iron  deficiency anemia due to chronic blood loss from hemorrhoidal bleeding Iron  deficiency anemia secondary to chronic blood loss from hemorrhoidal bleeding. Previous iron  infusions provided temporary improvement in energy levels. Current symptoms include significant fatigue and dizziness, likely due to ongoing anemia. Oral iron  supplements were previously tolerated but not significantly effective. - Checked blood counts and iron  levels - Continue oral iron  supplements - Will arrange for iron  infusions if iron  levels are low  Hemorrhoids with ongoing bleeding Chronic hemorrhoidal bleeding with continuous bleeding except for one or two days per week. Previous suppository treatment was ineffective. She expresses interest in surgical intervention due to significant blood loss and associated symptoms. Recent colonoscopy showed grade one internal hemorrhoids, but no bleeding during the procedure. Biopsies from the upper GI tract were benign. - Consulted with gastroenterologist regarding surgical options for hemorrhoid removal - Provided list of foods to avoid for 24-hour urine collection test  Tailbone pain (sacrococcygeal pain) Recent onset of tailbone pain, described as soreness rather than sharp pain. No trauma reported. Pain exacerbated by movement and relieved by heat and stretching exercises. Tylenol  provides some relief. - Continue Tylenol  extra strength up to four times a day - Use heat and cold therapy alternately - Continue stretching exercises  I reviewed lab results and outside records for this visit and discussed relevant results with the patient. Diagnosis, plan of care and treatment options were also discussed in detail with the patient. Opportunity provided to ask questions and answers provided to her apparent  satisfaction. Provided instructions to  call our clinic with any problems, questions or concerns prior to return visit. I recommended to continue follow-up with PCP and sub-specialists. She verbalized understanding and agreed with the plan.   NCCN guidelines have been consulted in the planning of this patients care.  I spent a total of 45 minutes during this encounter with the patient including review of chart and various tests results, discussions about plan of care and coordination of care plan.   Mclean Mclean  06/17/2024 9:14 AM  [facility] [facility] [id number] [place] [address] Dept: [phone] Dept Fax: [phone]    CHIEF COMPLAINT/ REASON FOR VISIT:   Well-differentiated neuroendocrine tumor of the small bowel, status post resection on 07/05/2023.  pT3, pN1  Current Treatment: Surveillance as per NCCN guidelines.  INTERVAL HISTORY:    Discussed the use of AI scribe software for clinical note transcription with the patient, who gave verbal consent to proceed.  History of Present Illness Mclean Mclean is a 28 year old female with a history of neuroendocrine tumor and anemia who presents with fatigue and hemorrhoidal bleeding.  She experiences persistent fatigue despite previous iron  infusions. Her daily routine includes waking up around 9 AM, napping from 12 to 1 PM until 4:30 to 5 PM, and feeling extremely tired by 9 to 9:30 PM. She previously took iron  supplements twice daily, which were covered by insurance, but felt they did not significantly improve her energy levels.  She has a history of hemorrhoidal bleeding, which is continuous with only one or two days of respite per week. The bleeding is heavy, with clots, and contributes to her anemia. She experiences dizziness and fatigue. She has tried suppositories without improvement.  Her past medical history includes a  neuroendocrine tumor, for which she underwent surgery. PET scans in February and August 2025 showed no evidence of the tumor. She experiences occasional hot flashes and dizziness. No diarrhea or chest palpitations.  She reports new onset pain in her tailbone area for the past week, described as muscle-related. She has been using heat pads, doing yoga, and taking Tylenol  twice daily for relief, but the pain persists. She avoids ibuprofen  due to her bleeding issues.  No abdominal pain, but she reports soreness in the abdomen.     I have reviewed the past medical history, past surgical history, social history and family history with the patient and they are unchanged from previous note.  HISTORY OF PRESENT ILLNESS:   ONCOLOGY HISTORY:   She was admitted to [facility] in October 2024 for acute GI bleeding.  She was seen to have melena.  A Meckel scan was ordered because of her age which was negative.  She had a CT angiogram which was also negative.  She had a capsule endoscopy that did show a spot in the distal third of the small bowel that was concerning for bleeding.  She subsequently had a push enteroscopy at [facility] and was found to have a an ulcerated small mass that was not circumferential and in the proximal ileum.  A biopsy was performed and this demonstrated a well-differentiated neuroendocrine tumor.     Patient underwent laparoscopic small bowel resection 07/05/2023. 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  1.1 cm in maximal dimension.  There was evidence of lymphovascular invasion and perineural invasion.  Margins negative for tumor.  1 out of 16 lymph nodes were positive for  neuroendocrine tumor.  Ki-67 low at 1%.  Mitotic rate <2 mitoses/HPF. pT3N1cM0.    At post op visit 08/21/23, patient was having some rectal bleeding, but hadn't had her colonoscopy yet because of surgery.  She had some expected post op diarrhea and some intermittent post op right sided abdominal  pain.  On 09/01/2023, restaging PET dotatate scan showed no signs of tracer avid locally recurrent tumor or metastatic disease.  On 03/11/2024, restaging PET dotatate scan showed no evidence of well differentiated neuroendocrine tumor recurrence or metastasis. Stable postsurgical change of the small bowel.  She remains on surveillance for her well-differentiated neuroendocrine tumor.  REVIEW OF SYSTEMS:   Review of Systems - Oncology  All other pertinent systems were reviewed with the patient and are negative.  ALLERGIES: She has no known allergies.  MEDICATIONS:  Current Outpatient Medications  Medication Sig Dispense Refill   DTx App - Wellness ([facility] TIER 1 PREVENT/ENG) MISC      famotidine  (PEPCID ) 40 MG tablet Take 1 tablet (40 mg total) by mouth at bedtime. 30 tablet 1   HALOETTE 0.12-0.015 MG/24HR vaginal ring 1 VAGINAL RING INTO THE VAGINA EVERY 4 WEEKS LEAVE IN PLACE FOR 3 WEEKS OF A 4-WEEK CYCLE     hydrocortisone  (ANUSOL -HC) 2.5 % rectal cream Place 1 Application rectally 2 (two) times daily. 30 g 2   hydrocortisone  (ANUSOL -HC) 25 MG suppository Place 1 suppository (25 mg total) rectally 2 (two) times daily. 20 suppository 0   omeprazole  (PRILOSEC) 20 MG capsule Take 1 capsule (20 mg total) by mouth daily. 90 capsule 3   promethazine  (PHENERGAN ) 25 MG tablet Take 0.5 tablets (12.5 mg total) by mouth every 6 (six) hours as needed for nausea or vomiting. 60 tablet 0   WEGOVY 0.5 MG/0.5ML SOAJ SQ injection Inject into the skin once a week.     ACCRUFER 30 MG CAPS Take by mouth. (Patient not taking: Reported on 06/17/2024)     hydrocortisone  (ANUSOL -HC) 25 MG suppository Place 1 suppository (25 mg total) rectally 2 (two) times daily. 12 suppository 0   No current facility-administered medications for this visit.     VITALS:   Blood pressure 115/68, pulse 75, temperature 97.9 F (36.6 C), temperature source Temporal, resp. rate 16, weight 233 lb 6.4 oz (105.9 kg), SpO2  100%, not currently breastfeeding.  Wt Readings from Last 3 Encounters:  06/17/24 233 lb 6.4 oz (105.9 kg)  05/30/24 230 lb (104.3 kg)  05/23/24 235 lb (106.6 kg)    Body mass index is 44.1 kg/m.   Onc Performance Status - 06/17/24 0847       ECOG Perf Status   ECOG Perf Status Ambulatory and capable of all selfcare but unable to carry out any work activities.  Up and about more than 50% of waking hours      KPS SCALE   KPS % SCORE Normal activity with effort, some s/s of disease          PHYSICAL EXAM:   Physical Exam Constitutional:      General: She is not in acute distress.    Appearance: Normal appearance.  HENT:     Head: Normocephalic and atraumatic.  Cardiovascular:     Rate and Rhythm: Normal rate.     Heart sounds: Normal heart sounds.  Pulmonary:     Effort: Pulmonary effort is normal. No respiratory distress.     Breath sounds: Normal breath sounds.  Abdominal:     General: There is no distension.  Neurological:     General: No focal deficit present.     Mental Status: She is alert and oriented to person, place, and time.  Psychiatric:        Mood and Affect: Mood normal.        Behavior: Behavior normal.      LABORATORY DATA:   I have reviewed the data as listed.  Results for orders placed or performed in visit on 06/17/24  Lactate dehydrogenase  Result Value Ref Range   LDH 140 105 - 235 U/L  Chromogranin A  Result Value Ref Range   Chromogranin A (ng/mL) <20.0 0.0 - 101.8 ng/mL  Ferritin  Result Value Ref Range   Ferritin 98 11 - 307 ng/mL  Iron  and TIBC  Result Value Ref Range   Iron  59 28 - 170 ug/dL   TIBC 662 749 - 549 ug/dL   Saturation Ratios 18 10.4 - 31.8 %   UIBC 278 ug/dL  CMP (Cancer [facility] only)  Result Value Ref Range   Sodium 139 135 - 145 mmol/L   Potassium 4.0 3.5 - 5.1 mmol/L   Chloride 104 98 - 111 mmol/L   CO2 26 22 - 32 mmol/L   Glucose, Bld 91 70 - 99 mg/dL   BUN 12 6 - 20 mg/dL   Creatinine 9.32 9.55 -  1.00 mg/dL   Calcium 9.6 8.9 - 89.6 mg/dL   Total Protein 7.4 6.5 - 8.1 g/dL   Albumin  4.5 3.5 - 5.0 g/dL   AST 16 15 - 41 U/L   ALT 28 0 - 44 U/L   Alkaline Phosphatase 87 38 - 126 U/L   Total Bilirubin 0.3 0.0 - 1.2 mg/dL   GFR, Estimated >39 >39 mL/min   Anion gap 9 5 - 15  CBC with Differential (Cancer [facility] Only)  Result Value Ref Range   WBC Count 6.3 4.0 - 10.5 K/uL   RBC 4.41 3.87 - 5.11 MIL/uL   Hemoglobin 11.7 (L) 12.0 - 15.0 g/dL   HCT 63.6 63.9 - 53.9 %   MCV 82.3 80.0 - 100.0 fL   MCH 26.5 26.0 - 34.0 pg   MCHC 32.2 30.0 - 36.0 g/dL   RDW 81.4 (H) 88.4 - 84.4 %   Platelet Count 298 150 - 400 K/uL   nRBC 0.0 0.0 - 0.2 %   Neutrophils Relative % 57 %   Neutro Abs 3.6 1.7 - 7.7 K/uL   Lymphocytes Relative 37 %   Lymphs Abs 2.3 0.7 - 4.0 K/uL   Monocytes Relative 4 %   Monocytes Absolute 0.3 0.1 - 1.0 K/uL   Eosinophils Relative 1 %   Eosinophils Absolute 0.1 0.0 - 0.5 K/uL   Basophils Relative 1 %   Basophils Absolute 0.0 0.0 - 0.1 K/uL   Immature Granulocytes 0 %   Abs Immature Granulocytes 0.01 0.00 - 0.07 K/uL      RADIOGRAPHIC STUDIES:  I have personally reviewed the radiological images as listed and agree with the findings in the report.  NM PET DOTATATE SKULL BASE TO MID THIGH CLINICAL DATA:  Well differentiated neuroendocrine tumor. Residual tumor of the proximal ileum.  EXAM: NUCLEAR MEDICINE PET SKULL BASE TO THIGH  TECHNIQUE: 4.0 mCi copper  64 DOTATATE was injected intravenously. Full-ring PET imaging was performed from the skull base to thigh after the radiotracer. CT data was obtained and used for attenuation correction and anatomic localization.  COMPARISON:  DOTATATE PET scan 09/01/2023  FINDINGS: NECK  No radiotracer  activity in neck lymph nodes.  Incidental CT findings: None  CHEST  No radiotracer accumulation within mediastinal or hilar lymph nodes. No suspicious pulmonary nodules on the CT scan.  Incidental CT  finding:None  ABDOMEN/PELVIS  Small bowel anastomosis in the mid abdomen. No associated radiotracer activity. No radiotracer avid mesenteric implants. No abnormal radiotracer activity in the liver. No evidence of nodal metastasis.  Physiologic activity noted in the liver, spleen, adrenal glands and kidneys.  Incidental CT findings:Uterus and adnexa unremarkable.  SKELETON  No focal activity to suggest skeletal metastasis.  Incidental CT findings:None  IMPRESSION: 1. No evidence of Well differentiated neuroendocrine tumor recurrence or metastasis. 2. Stable postsurgical change of the small bowel.  Electronically Signed   By: Jackquline Boxer M.D.   On: 03/11/2024 12:39   CODE STATUS:  Code Status History     Date Active Date Inactive Code Status Order ID Comments User Context   07/05/2023 1438 07/16/2023 2021 Full Code [id number]  Aron Shoulders, [facility]clean Inpatient   05/05/2023 0831 05/11/2023 0202 Full Code [id number]  Tobie Yetta HERO, Mclean Inpatient   01/15/2023 2347 01/17/2023 0828 Full Code [id number]  Richie Harlene RODES, DO Inpatient   01/15/2023 2346 01/15/2023 2347 Full Code [id number]  Herchel Gloris LABOR, Mclean Inpatient   10/10/2022 1725 10/12/2022 1528 Full Code [id number]  Eveline Lynwood MATSU, Mclean Inpatient   07/02/2015 0607 07/03/2015 1535 Full Code [id number]  Marguerite Willma Mclean, CNM Inpatient   07/01/2015 0940 07/02/2015 0607 Full Code [id number]  Deward Slater FALCON, RN Inpatient   06/07/2014 0424 06/08/2014 1833 Full Code [id number]  Lenon Oneil BRAVO, Mclean Inpatient   06/06/2014 1418 06/07/2014 0424 Full Code [id number]  Eilleen Fitch, RN Inpatient    Questions for Most Recent Historical Code Status (Order [id number])     Question Answer   By: Other            Orders Placed This Encounter  Procedures   5 HIAA, quantitative, urine, 24 hour    Standing Status:   Future    Expiration Date:   06/17/2025   CBC with Differential (Cancer [facility] Only)    Standing Status:   Future     Expiration Date:   07/11/2025   CMP (Cancer M[facility] only)    Standing Status:   Future    Expiration Date:   07/11/2025   Iron  and TIBC    Standing Status:   Future    Expiration Date:   07/11/2025   Ferritin    Standing Status:   Future    Expiration Date:   07/11/2025     Future Appointments  Date Time Provider Department [facility]  07/29/2024  8:30 AM [facility] PHLEBOTOMIST [facility] None  07/29/2024  9:00 AM Laquia Rosano, M[facility], [facility] None  08/01/2024 11:20 AM Cirigliano, Sandor GAILS, DO [facility]     This document was completed utilizing speech recognition software. Grammatical errors, random word insertions, pronoun errors, and incomplete sentences are an occasional consequence of this system due to software limitations, ambient noise, and hardware issues. Any formal questions or concerns about the content, text or information contained within the body of this dictation should be directly addressed to the provider for clarification.   "

## 2024-06-17 NOTE — Telephone Encounter (Signed)
 Ok to schedule banding with with me or Dr. San.  RG

## 2024-06-17 NOTE — Telephone Encounter (Signed)
 Left message for pt to call back

## 2024-06-18 NOTE — Telephone Encounter (Signed)
 Chart reviewed and noted that pt has been scheduled for banding by scheduler on 08/01/2024

## 2024-06-19 LAB — CHROMOGRANIN A: Chromogranin A (ng/mL): <20 ng/mL (ref 0.0–101.8)

## 2024-06-20 ENCOUNTER — Telehealth: Payer: Self-pay | Admitting: Gastroenterology

## 2024-06-20 NOTE — Telephone Encounter (Signed)
 Returned call to patient, she is requesting to be put under anesthesia for the hemorrhoid banding. Provided education to patient & advised banding should be painless, and anesthesia is not needed. She is also requesting Dr. Charlanne to fill out short term disability form. She continues to have frequent rectal bleeding 2-3 times/week, grape size clots. Feels dizzy & week at times. Last iron  infusion was 05/23/24 & most recent hgb 3 days ago 11.7.

## 2024-06-20 NOTE — Telephone Encounter (Signed)
 Inbound call from patient requesting to speak to the nurse in regards to possible being put to sleep for her hemorrhoid banding due to her having really low pain tolerance. Patient would also like to speak to in regards to possibly having Dr. Charlanne extending her current short term disability. Patient is requesting a call back. Please advise.

## 2024-06-21 ENCOUNTER — Ambulatory Visit: Admission: EM | Admit: 2024-06-21 | Discharge: 2024-06-21 | Disposition: A | Discharge status: Home / Self Care

## 2024-06-21 DIAGNOSIS — K1120 Sialoadenitis, unspecified: Secondary | ICD-10-CM | POA: Diagnosis not present

## 2024-06-21 MED ORDER — KETOROLAC TROMETHAMINE 30 MG/ML IJ SOLN
30.0000 mg | Freq: Once | INTRAMUSCULAR | Status: AC
  Administered 2024-06-21: 30 mg via INTRAMUSCULAR

## 2024-06-21 MED ORDER — AMOXICILLIN-POT CLAVULANATE 875-125 MG PO TABS: 1.0000 | ORAL_TABLET | Freq: Two times a day (BID) | ORAL | 0 refills | Status: AC

## 2024-06-21 NOTE — Telephone Encounter (Signed)
 Left message for patient to call back

## 2024-06-21 NOTE — Discharge Instructions (Signed)
 I believe that you have a stone blocking your salivary gland that is leading to an infection.  Start Augmentin  twice daily for 10 days.  Gargle with warm salt water  multiple times a day.  I also recommend using sour hard candy to encourage saliva production and hopefully dislodge and remove any stone that is present.  If your symptoms are not going away within a few days please follow-up with ENT; call to schedule an appointment.  If anything worsens and you have difficulty swelling, swelling of your throat, shortness of breath, change in your voice, nausea/vomiting, not feeling better within 24 hours of starting the medication I want you to go to the emergency room.

## 2024-06-21 NOTE — ED Provider Notes (Signed)
 [facility]    CSN: [id number] Arrival date & time: 06/21/24  1339      History   Chief Complaint Chief Complaint  Patient presents with   Lymphadenopathy    HPI Felma Pfefferle Kronick is a 28 y.o. female.   Patient presents today with a weeklong history of pain in her left inferior jaw that radiates into her ear.  She reports pain is rated 10 on a 0-10 pain scale, described as sharp, worse with mastication or eating, no alleviating factors identified.  She reports that when she eats that she noticed that the swelling increases and she has also noticed a decrease in saliva production on the side of her mouth.  She denies any recent antibiotics.  Denies any fever, cough, congestion, swelling of her throat, shortness of breath, muffled voice.  She denies any recent dental procedure or extraction.  She is confident that she is not pregnant.  She is having difficulty with her daily activities and had to call out of work because of the pain and difficulty eating.    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   Other fatigue 08/21/2023   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   Neuroendocrine neoplasm of small intestine (HCC) 06/05/2023   Partial small bowel obstruction (HCC) 06/05/2023   Preoperative evaluation to rule out surgical  contraindication 06/02/2023   Morbid obesity with BMI of 45.0-49.9, adult (HCC) 05/14/2023   Acute blood loss anemia 05/10/2023   GI bleeding 05/04/2023   Postpartum care following cesarean delivery 03/07/2023   Contraception management 03/07/2023   S/P cesarean section 01/17/2023   Placenta disorder 07/08/2015   Other iron  deficiency anemias 04/30/2015    Past Surgical History:  Procedure Laterality Date   CESAREAN SECTION N/A 01/17/2023   Procedure: CESAREAN SECTION;  Surgeon: Herchel Gloris LABOR, MD;  Location: [facility];  Service: Obstetrics;  Laterality: N/A;   Chin surgery     ESOPHAGOGASTRODUODENOSCOPY N/A 05/06/2023   Procedure: ESOPHAGOGASTRODUODENOSCOPY (EGD);  Surgeon: Saintclair Jasper, MD;  Location: [facility];  Service: Gastroenterology;  Laterality: N/A;   FLEXIBLE SIGMOIDOSCOPY N/A 10/11/2022   Procedure: FLEXIBLE SIGMOIDOSCOPY;  Surgeon: Charlanne Groom, MD;  Location: [facility];  Service: Gastroenterology;  Laterality: N/A;   GIVENS CAPSULE STUDY N/A 05/06/2023   Procedure: GIVENS CAPSULE STUDY;  Surgeon: Saintclair Jasper, MD;  Location: [facility];  Service: Gastroenterology;  Laterality: N/A;   HAND SURGERY     Left  hand   LAPAROSCOPIC SMALL BOWEL RESECTION N/A 07/05/2023   Procedure: LAPAROSCOPIC SMALL BOWEL RESECTION;  Surgeon: Aron Shoulders, MD;  Location: [facility] OR;  Service: General;  Laterality: N/A;    OB History     Gravida  6   Para  3   Term  2   Preterm  1   AB  3  Living  3      SAB  3   IAB  0   Ectopic  0   Multiple      Live Births  3            Home Medications    Prior to Admission medications   Medication Sig Start Date End Date Taking? Authorizing Provider  acetaminophen  (TYLENOL ) 500 MG tablet Take 1,500 mg by mouth every 6 (six) hours as needed.   Yes [provider]  amoxicillin -clavulanate (AUGMENTIN ) 875-125 MG tablet Take 1 tablet by mouth every 12 (twelve) hours. 06/21/24  Yes Sargun Rummell K, PA-C  ACCRUFER  30 MG CAPS Take by mouth. Patient not taking: Reported on 06/17/2024 10/31/23   [provider]  DTx App - Wellness ([facility] [facility] TIER 1 PREVENT/ENG) MISC  05/22/24   [provider]  famotidine  (PEPCID ) 40 MG tablet Take 1 tablet (40 mg total) by mouth at bedtime. 03/21/24   Craig Palma R, PA-C  HALOETTE 0.12-0.015 MG/24HR vaginal ring 1 VAGINAL RING INTO THE VAGINA EVERY 4 WEEKS LEAVE IN PLACE FOR 3 WEEKS OF A 4-WEEK CYCLE 01/11/24   [provider]  hydrocortisone  (ANUSOL -HC) 2.5 % rectal cream Place 1 Application rectally 2 (two) times daily. 03/21/24   Craig Palma SAUNDERS, PA-C  hydrocortisone  (ANUSOL -HC) 25 MG suppository Place 1 suppository (25 mg total) rectally 2 (two) times daily. 03/21/24   Craig Palma SAUNDERS, PA-C  hydrocortisone  (ANUSOL -HC) 25 MG suppository Place 1 suppository (25 mg total) rectally 2 (two) times daily. 05/30/24   Charlanne Groom, MD  omeprazole  (PRILOSEC) 20 MG capsule Take 1 capsule (20 mg total) by mouth daily. 05/30/24   Charlanne Groom, MD  promethazine  (PHENERGAN ) 25 MG tablet Take 0.5 tablets (12.5 mg total) by mouth every 6 (six) hours as needed for nausea or vomiting. 03/21/24   Craig Palma R, PA-C  WEGOVY 0.5 MG/0.5ML SOAJ SQ injection Inject into the skin once a week. 05/23/24   [provider]    Family History Family History  Adopted: Yes  Problem Relation Age of Onset   Lung disease Mother    Depression Mother    Heart disease Father    Diabetes Father    Hypertension Father    Learning disabilities Neg Hx    Mental illness Neg Hx    Mental retardation Neg Hx    Vision loss Neg Hx    Varicose Veins Neg Hx    Asthma Neg Hx     Social History Social History   Tobacco Use   Smoking status: Never    Passive exposure: Never   Smokeless tobacco: Never  Vaping Use   Vaping status: Never Used  Substance Use Topics   Alcohol use: No    Alcohol/week: 0.0 standard drinks of alcohol   Drug use: No     Allergies    Patient has no known allergies.   Review of Systems Review of Systems  Constitutional:  Positive for activity change. Negative for appetite change, fatigue and fever.  HENT:  Positive for facial swelling, sore throat and trouble swallowing. Negative for congestion and voice change.   Respiratory:  Negative for cough and shortness of breath.   Cardiovascular:  Negative for chest pain.  Gastrointestinal:  Negative for diarrhea, nausea and vomiting.     Physical Exam Triage Vital Signs ED Triage Vitals  Encounter Vitals Group     BP 06/21/24 1358 126/84     Girls Systolic BP Percentile --  Girls Diastolic BP Percentile --      Boys Systolic BP Percentile --      Boys Diastolic BP Percentile --      Pulse Rate 06/21/24 1358 90     Resp 06/21/24 1358 18     Temp 06/21/24 1358 99.1 F (37.3 C)     Temp Source 06/21/24 1358 Oral     SpO2 06/21/24 1358 98 %     Weight 06/21/24 1356 230 lb (104.3 kg)     Height 06/21/24 1356 5' (1.524 m)     Head Circumference --      Peak Flow --      Pain Score 06/21/24 1354 7     Pain Loc --      Pain Education --      Exclude from Growth Chart --    No data found.  Updated Vital Signs BP 126/84 (BP Location: Right Arm)   Pulse 90   Temp 99.1 F (37.3 C) (Oral)   Resp 18   Ht 5' (1.524 m)   Wt 230 lb (104.3 kg)   LMP 06/20/2024 (Exact Date)   SpO2 98%   Breastfeeding No   BMI 44.92 kg/m   Visual Acuity Right Eye Distance:   Left Eye Distance:   Bilateral Distance:    Right Eye Near:   Left Eye Near:    Bilateral Near:     Physical Exam Vitals reviewed.  Constitutional:      General: She is awake. She is not in acute distress.    Appearance: Normal appearance. She is well-developed. She is not ill-appearing.     Comments: Very pleasant female appears stated age in no acute distress  HENT:     Head: Normocephalic and atraumatic.     Salivary Glands: Left salivary gland is diffusely enlarged and tender.      Right  Ear: Tympanic membrane, ear canal and external ear normal. Tympanic membrane is not erythematous or bulging.     Left Ear: Tympanic membrane, ear canal and external ear normal. Tympanic membrane is not erythematous or bulging.     Mouth/Throat:     Pharynx: Uvula midline. No oropharyngeal exudate or posterior oropharyngeal erythema.     Comments: Small yellow hard structure noted at left sublingual carbuncle Cardiovascular:     Rate and Rhythm: Normal rate and regular rhythm.     Heart sounds: Normal heart sounds, S1 normal and S2 normal. No murmur heard. Pulmonary:     Effort: Pulmonary effort is normal.     Breath sounds: Normal breath sounds. No wheezing, rhonchi or rales.     Comments: Clear to auscultation bilaterally Lymphadenopathy:     Head:     Right side of head: No submental, submandibular or tonsillar adenopathy.     Left side of head: No submental, submandibular or tonsillar adenopathy.  Psychiatric:        Behavior: Behavior is cooperative.      UC Treatments / Results  Labs (all labs ordered are listed, but only abnormal results are displayed) Labs Reviewed - No data to display  EKG   Radiology No results found.  Procedures Procedures (including critical care time)  Medications Ordered in UC Medications  ketorolac  (TORADOL ) 30 MG/ML injection 30 mg (30 mg Intramuscular Given 06/21/24 1440)    Initial Impression / Assessment and Plan / UC Course  I have reviewed the triage vital signs and the nursing notes.  Pertinent labs & imaging results that were available  during my care of the patient were reviewed by me and considered in my medical decision making (see chart for details).     Patient is well-appearing, afebrile, nontoxic, nontachycardic.  She did have what appeared to be a stone at the entrance to the Wharton's duct and so I did use a tongue depressor to massage the area and was able to produce some clear salty fluid.  I am concerned that patient has  sialadenitis given clinical presentation.  Will cover with Augmentin  twice daily for 10 days.  Recommend that she gargle with warm salt water  and use hard sour candy to encourage saliva production and improve symptoms.  She was given Toradol  in clinic to help manage her pain which did provide improvement as her pain decreased from a 10 to a 4 before discharge.  We discussed that she is not to take additional NSAIDs for the next 24 hours given Toradol  injection today but can use Tylenol  for breakthrough pain.  We discussed that she should follow-up with an ear nose and throat doctor was given the contact information for local provider with instruction to call to schedule an appointment.  If she is not better within 24 hours or if anything worsens and she has enlarging lesion, swelling of her throat, shortness of breath, muffled voice she needs to go to the emergency room immediately to which she expressed understanding.  Excuse note provided.  Final Clinical Impressions(s) / UC Diagnoses   Final diagnoses:  Sialoadenitis     Discharge Instructions      I believe that you have a stone blocking your salivary gland that is leading to an infection.  Start Augmentin  twice daily for 10 days.  Gargle with warm salt water  multiple times a day.  I also recommend using sour hard candy to encourage saliva production and hopefully dislodge and remove any stone that is present.  If your symptoms are not going away within a few days please follow-up with ENT; call to schedule an appointment.  If anything worsens and you have difficulty swelling, swelling of your throat, shortness of breath, change in your voice, nausea/vomiting, not feeling better within 24 hours of starting the medication I want you to go to the emergency room.     ED Prescriptions     Medication Sig Dispense Auth. Provider   amoxicillin -clavulanate (AUGMENTIN ) 875-125 MG tablet Take 1 tablet by mouth every 12 (twelve) hours. 20 tablet Stephanee Barcomb,  Ladamien Rammel K, PA-C      PDMP not reviewed this encounter.   Sherrell Rocky POUR, PA-C 06/21/24 1523

## 2024-06-21 NOTE — Telephone Encounter (Signed)
 Pt has appt with Dr. San for banding We do not advise anesthesia for banding-as at times if band is placed below the dentate line, it can be very painful.  That is why, we do not do banding during colonoscopy. For short-term disability-hemorrhoids do not qualify for short-term disability.  Still if she wants to try, I will be more than happy to fill forms RG

## 2024-06-21 NOTE — ED Triage Notes (Signed)
 Patient is concerned with swollen lymph nodes in neck. First noticed about a week ago, now throat is sore with pain radiating up to left ear. No fever. No rash. No injury.

## 2024-06-25 ENCOUNTER — Encounter (HOSPITAL_BASED_OUTPATIENT_CLINIC_OR_DEPARTMENT_OTHER): Payer: Self-pay

## 2024-06-25 ENCOUNTER — Emergency Department (HOSPITAL_BASED_OUTPATIENT_CLINIC_OR_DEPARTMENT_OTHER)

## 2024-06-25 ENCOUNTER — Emergency Department (HOSPITAL_BASED_OUTPATIENT_CLINIC_OR_DEPARTMENT_OTHER)
Admission: EM | Admit: 2024-06-25 | Discharge: 2024-06-25 | Disposition: A | Discharge status: Home / Self Care | Attending: Emergency Medicine | Admitting: Emergency Medicine

## 2024-06-25 ENCOUNTER — Other Ambulatory Visit: Payer: Self-pay

## 2024-06-25 DIAGNOSIS — K1120 Sialoadenitis, unspecified: Secondary | ICD-10-CM

## 2024-06-25 DIAGNOSIS — K115 Sialolithiasis: Secondary | ICD-10-CM

## 2024-06-25 LAB — COMPREHENSIVE METABOLIC PANEL WITH GFR
ALT: 12 U/L (ref 0–44)
AST: 11 U/L — ABNORMAL LOW (ref 15–41)
Albumin: 4.4 g/dL (ref 3.5–5.0)
Alkaline Phosphatase: 73 U/L (ref 38–126)
Anion gap: 11 (ref 5–15)
BUN: 6 mg/dL (ref 6–20)
CO2: 26 mmol/L (ref 22–32)
Calcium: 9.2 mg/dL (ref 8.9–10.3)
Chloride: 104 mmol/L (ref 98–111)
Creatinine, Ser: 0.64 mg/dL (ref 0.44–1.00)
GFR, Estimated: >60 mL/min (ref >60)
Glucose, Bld: 84 mg/dL (ref 70–99)
Potassium: 3.4 mmol/L — ABNORMAL LOW (ref 3.5–5.1)
Sodium: 141 mmol/L (ref 135–145)
Total Bilirubin: 0.3 mg/dL (ref 0.0–1.2)
Total Protein: 7.3 g/dL (ref 6.5–8.1)

## 2024-06-25 LAB — CBC WITH DIFFERENTIAL/PLATELET
Abs Immature Granulocytes: 0.03 K/uL (ref 0.00–0.07)
Basophils Absolute: 0 K/uL (ref 0.0–0.1)
Basophils Relative: 0 %
Eosinophils Absolute: 0.1 K/uL (ref 0.0–0.5)
Eosinophils Relative: 1 %
HCT: 34.3 % — ABNORMAL LOW (ref 36.0–46.0)
Hemoglobin: 11.1 g/dL — ABNORMAL LOW (ref 12.0–15.0)
Immature Granulocytes: 0 %
Lymphocytes Relative: 27 %
Lymphs Abs: 2.3 K/uL (ref 0.7–4.0)
MCH: 26.6 pg (ref 26.0–34.0)
MCHC: 32.4 g/dL (ref 30.0–36.0)
MCV: 82.3 fL (ref 80.0–100.0)
Monocytes Absolute: 0.4 K/uL (ref 0.1–1.0)
Monocytes Relative: 5 %
Neutro Abs: 5.7 K/uL (ref 1.7–7.7)
Neutrophils Relative %: 67 %
Platelets: 305 K/uL (ref 150–400)
RBC: 4.17 MIL/uL (ref 3.87–5.11)
RDW: 17.6 % — ABNORMAL HIGH (ref 11.5–15.5)
WBC: 8.5 K/uL (ref 4.0–10.5)
nRBC: 0 % (ref 0.0–0.2)

## 2024-06-25 LAB — PREGNANCY, URINE: Preg Test, Ur: NEGATIVE

## 2024-06-25 MED ORDER — DEXAMETHASONE SOD PHOSPHATE PF 10 MG/ML IJ SOLN
10.0000 mg | Freq: Once | INTRAMUSCULAR | Status: AC
  Administered 2024-06-25: 10 mg via INTRAVENOUS

## 2024-06-25 MED ORDER — KETOROLAC TROMETHAMINE 15 MG/ML IJ SOLN
15.0000 mg | Freq: Once | INTRAMUSCULAR | Status: AC
  Administered 2024-06-25: 15 mg via INTRAVENOUS
  Filled 2024-06-25: qty 1

## 2024-06-25 MED ORDER — MORPHINE SULFATE (PF) 4 MG/ML IV SOLN
4.0000 mg | Freq: Once | INTRAVENOUS | Status: AC
  Administered 2024-06-25: 4 mg via INTRAVENOUS
  Filled 2024-06-25: qty 1

## 2024-06-25 MED ORDER — ONDANSETRON HCL 4 MG/2ML IJ SOLN
4.0000 mg | Freq: Once | INTRAMUSCULAR | Status: AC
  Administered 2024-06-25: 4 mg via INTRAVENOUS
  Filled 2024-06-25: qty 2

## 2024-06-25 MED ORDER — OXYCODONE HCL 5 MG PO TABS: 5.0000 mg | ORAL_TABLET | ORAL | 0 refills | Status: AC | PRN

## 2024-06-25 MED ORDER — METHYLPREDNISOLONE 4 MG PO TBPK: ORAL_TABLET | ORAL | 0 refills | Status: AC

## 2024-06-25 MED ORDER — IOHEXOL 300 MG/ML  SOLN
100.0000 mL | Freq: Once | INTRAMUSCULAR | Status: AC | PRN
  Administered 2024-06-25: 75 mL via INTRAVENOUS

## 2024-06-25 NOTE — Discharge Instructions (Addendum)
 You were seen for salivary stone in the emergency department.   At home, please continue your Augmentin  as well as Tylenol  and ibuprofen  for your pain. You may also take the oxycodone  we have prescribed you for any breakthrough pain that may have.  Do not take this before driving or operating heavy machinery.  Do not take this medication with alcohol.  Continue warm compresses and the lemon drops.    Check your MyChart online for the results of any tests that had not resulted by the time you left the emergency department.   Follow-up with the ear nose and throat doctors as soon as possible since your salivary gland may need to be removed on that side.  Return immediately to the emergency department if you experience any of the following: Fevers, worsening pain, or any other concerning symptoms.    Thank you for visiting our Emergency Department. It was a pleasure taking care of you today.

## 2024-06-25 NOTE — ED Provider Notes (Signed)
 [place] [facility] AT [facility] Provider Note   CSN: [id number] Arrival date & time: 06/25/24  1037     Patient presents with: Facial Swelling   Nicole Mclean is a 28 y.o. female.   28 year old female with a history of GI neuroendocrine tumor who presents to the emergency department with left submandibular pain and swelling.  Went to urgent care and was diagnosed with sialadenitis.  Was discharged home with lemon drops and Augmentin  but reports that pain has gotten worse.  Had some pus coming out of her mouth the other day as well.  No fevers or chills.  Says that the pain is going up her face and she is having some difficulty opening her mouth.         Prior to Admission medications   Medication Sig Start Date End Date Taking? Authorizing Provider  methylPREDNISolone  (MEDROL  DOSEPAK) 4 MG TBPK tablet Take as directed on the packaging 06/25/24  Yes Yolande Lamar BROCKS, MD  oxyCODONE  (ROXICODONE ) 5 MG immediate release tablet Take 1 tablet (5 mg total) by mouth every 4 (four) hours as needed for severe pain (pain score 7-10). 06/25/24  Yes Yolande Lamar BROCKS, MD  ACCRUFER 30 MG CAPS Take by mouth. Patient not taking: Reported on 06/17/2024 10/31/23   [provider]  acetaminophen  (TYLENOL ) 500 MG tablet Take 1,500 mg by mouth every 6 (six) hours as needed.    [provider]  amoxicillin -clavulanate (AUGMENTIN ) 875-125 MG tablet Take 1 tablet by mouth every 12 (twelve) hours. 06/21/24   Raspet, Rocky POUR, PA-C  DTx App - Wellness ([facility] [facility] TIER 1 PREVENT/ENG) MISC  05/22/24   [provider]  famotidine  (PEPCID ) 40 MG tablet Take 1 tablet (40 mg total) by mouth at bedtime. 03/21/24   Craig Palma R, PA-C  HALOETTE 0.12-0.015 MG/24HR vaginal ring 1 VAGINAL RING INTO THE VAGINA EVERY 4 WEEKS LEAVE IN PLACE FOR 3 WEEKS OF A 4-WEEK CYCLE 01/11/24   [provider]  hydrocortisone  (ANUSOL -HC) 2.5 % rectal cream Place 1 Application  rectally 2 (two) times daily. 03/21/24   Craig Palma SAUNDERS, PA-C  hydrocortisone  (ANUSOL -HC) 25 MG suppository Place 1 suppository (25 mg total) rectally 2 (two) times daily. 03/21/24   Craig Palma SAUNDERS, PA-C  hydrocortisone  (ANUSOL -HC) 25 MG suppository Place 1 suppository (25 mg total) rectally 2 (two) times daily. 05/30/24   Charlanne Groom, MD  omeprazole  (PRILOSEC) 20 MG capsule Take 1 capsule (20 mg total) by mouth daily. 05/30/24   Charlanne Groom, MD  promethazine  (PHENERGAN ) 25 MG tablet Take 0.5 tablets (12.5 mg total) by mouth every 6 (six) hours as needed for nausea or vomiting. 03/21/24   Craig Palma R, PA-C  WEGOVY 0.5 MG/0.5ML SOAJ SQ injection Inject into the skin once a week. 05/23/24   [provider]    Allergies: Patient has no known allergies.    Review of Systems  Updated Vital Signs BP 126/82 (BP Location: Left Arm)   Pulse 78   Temp 98.4 F (36.9 C) (Oral)   Resp 16   Ht 5' (1.524 m)   Wt 104.3 kg   LMP 06/20/2024 (Exact Date)   SpO2 100%   BMI 44.91 kg/m   Physical Exam HENT:     Mouth/Throat:     Comments: Uvula: without deviation or edema. Uvula midline. Soft palate: without swelling Sublingual: normal appearance/no brawny edema or tongue elevation Teeth and gums: No periapical swelling or fluctuance, no tooth fracture, no gingival swelling,  no active oral bleeding. No TTP of teeth.  Tonsils: no erythema or exudates Tender to palpation underneath the left mandible with some induration noted. No salivary stones noted in the sublingual or parotid duct Eyes:     Extraocular Movements: Extraocular movements intact.     Conjunctiva/sclera: Conjunctivae normal.     Pupils: Pupils are equal, round, and reactive to light.  Cardiovascular:     Rate and Rhythm: Normal rate and regular rhythm.  Pulmonary:     Effort: Pulmonary effort is normal.     Breath sounds: Normal breath sounds. No stridor.     (all labs ordered are listed, but only abnormal  results are displayed) Labs Reviewed  CBC WITH DIFFERENTIAL/PLATELET - Abnormal; Notable for the following components:      Result Value   Hemoglobin 11.1 (*)    HCT 34.3 (*)    RDW 17.6 (*)    All other components within normal limits  COMPREHENSIVE METABOLIC PANEL WITH GFR - Abnormal; Notable for the following components:   Potassium 3.4 (*)    AST 11 (*)    All other components within normal limits  PREGNANCY, URINE    EKG: None  Radiology: CT Soft Tissue Neck W Contrast Result Date: 06/25/2024 CLINICAL DATA:  Left jaw swelling with throat, tongue and ear pain EXAM: CT NECK WITH CONTRAST TECHNIQUE: Multidetector CT imaging of the neck was performed using the standard protocol following the bolus administration of intravenous contrast. RADIATION DOSE REDUCTION: This exam was performed according to the departmental dose-optimization program which includes automated exposure control, adjustment of the mA and/or kV according to patient size and/or use of iterative reconstruction technique. CONTRAST:  75mL OMNIPAQUE  IOHEXOL  300 MG/ML  SOLN COMPARISON:  None Available. FINDINGS: Pharynx: The nasopharynx, oropharynx and hypopharynx are normal Oral cavity/floor of mouth: Normal Larynx: Normal Salivary glands: There is a stone in the left submandibular gland/duct with inflammation of the left submandibular gland. The right submandibular gland in the parotid glands are normal. Thyroid : Normal Lymph nodes: No suppurative lymph nodes Vascular: No significant abnormality Limited intracranial: No significant abnormality Visualized orbits: No significant abnormality Mastoids and visualized paranasal sinuses: No significant abnormality Skeleton: No significant abnormality Upper chest: No significant abnormality Other: None IMPRESSION: There is a stone in the left submandibular gland/duct with inflammation of the left submandibular gland Electronically Signed   By: Nancyann Burns M.D.   On: 06/25/2024 12:57      Procedures   Medications Ordered in the ED  morphine  (PF) 4 MG/ML injection 4 mg (4 mg Intravenous Given 06/25/24 1143)  ondansetron  (ZOFRAN ) injection 4 mg (4 mg Intravenous Given 06/25/24 1140)  ketorolac  (TORADOL ) 15 MG/ML injection 15 mg (15 mg Intravenous Given 06/25/24 1141)  iohexol  (OMNIPAQUE ) 300 MG/ML solution 100 mL (75 mLs Intravenous Contrast Given 06/25/24 1234)  dexamethasone  (DECADRON ) injection 10 mg (10 mg Intravenous Given 06/25/24 1403)    Clinical Course as of 06/25/24 1420  Tue Jun 25, 2024  1333 Dr Llewellyn from ENT is currently in the OR.  Will review the patient's info and get back to us  when she finishes her case. Secure chat sent so she has the patient's information.  [RP]  1354 Give decadron  now and medrol  dosepack. Continue antibiotics. Stay well hydrated. Sialogogues. Will need outpatient surgery.  [RP]    Clinical Course User Index [RP] Yolande Lamar BROCKS, MD  Medical Decision Making Amount and/or Complexity of Data Reviewed Labs: ordered. Radiology: ordered.  Risk Prescription drug management.   Nicole Mclean is a 28 year old female with a history of GI neuroendocrine tumor who presents to the emergency department with left submandibular pain and swelling.   Initial Ddx:  Sialadenitis, sialolithiasis, abscess, Ludwig angina, dental infection  MDM/Course:  Patient presents emergency department with persistent pain after being diagnosed with sialadenitis.  Is on day 5 of antibiotics and sialagogues.  On exam does have an indurated area under her left jaw.  Does not appear to have any obvious dental infections.  Does not have a stone that I am able to express.  She underwent a CT scan to rule out abscess.  Did show a 7 mm stone but no other signs of complication.  Upon re-evaluation pain had improved with Toradol  and morphine .  Did discuss with ENT since I suspect that this may need a procedure or surgery for the stone  to be removed.  They reported that they will follow-up with her as an outpatient shortly and discuss salivary gland removal with the patient on an elective basis but that this does not need to be emergently performed.  They recommended that she receive steroids as well.  Patient given a work note as well as a prescription for oxycodone  to take for any breakthrough pain  This patient presents to the ED for concern of complaints listed in HPI, this involves an extensive number of treatment options, and is a complaint that carries with it a high risk of complications and morbidity. Disposition including potential need for admission considered.   Dispo: DC Home. Return precautions discussed including, but not limited to, those listed in the AVS. Allowed pt time to ask questions which were answered fully prior to dc.  Records reviewed Outpatient Clinic Notes The following labs were independently interpreted: Chemistry and show no acute abnormality I independently reviewed the following imaging with scope of interpretation limited to determining acute life threatening conditions related to emergency care: CT neck and agree with the radiologist interpretation with the following exceptions: none I personally reviewed and interpreted cardiac monitoring: normal sinus rhythm  I personally reviewed and interpreted the pt's EKG: see above for interpretation  I have reviewed the patients home medications and made adjustments as needed Consults: ENT  Portions of this note were generated with Dragon dictation software. Dictation errors may occur despite best attempts at proofreading.      Final diagnoses:  Sialolithiasis of submandibular gland  Sialadenitis    ED Discharge Orders          Ordered    methylPREDNISolone  (MEDROL  DOSEPAK) 4 MG TBPK tablet        06/25/24 1356    oxyCODONE  (ROXICODONE ) 5 MG immediate release tablet  Every 4 hours PRN        06/25/24 1356               Yolande Lamar BROCKS, MD 06/25/24 1420

## 2024-06-25 NOTE — ED Notes (Signed)
 Report received from Alexandra, [place]. Assuming pt care at this time.

## 2024-06-25 NOTE — ED Notes (Signed)
 1 set of blood cultures sent to Lab to hold

## 2024-06-25 NOTE — ED Triage Notes (Signed)
 Reports swollen L side of jaw for over 1 week. Seen by UC 1 week ago, states swelling/pain has spread to ear, tongue and throat. No relief from abx from UC  Reports difficulty swallowing Voice hoarse in triage  States had puss coming from under tongue last night

## 2024-06-25 NOTE — ED Notes (Signed)
 Patient has difficulty opening jaw. C/o pain in left side of throat, jaw and left ear. States hearing in left ear is like she is under water . Difficulty swallowing. States after eating, under her jaw gets more swollen. Denies fever, c/o chills. Took 5 days of abx with worsening of symptoms. Nausea, no vomiting. Voice is hoarse.

## 2024-06-27 NOTE — Telephone Encounter (Signed)
 Spoke with patient & she was able to have another doctor fill out short term disability forms.

## 2024-07-11 ENCOUNTER — Encounter: Payer: Self-pay | Admitting: Oncology

## 2024-07-11 NOTE — Assessment & Plan Note (Signed)
 Please review oncology history for additional details and timeline of events.  Patient underwent laparoscopic small bowel resection 07/05/2023. 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  1.1 cm in maximal dimension.  There was evidence of lymphovascular invasion and perineural invasion.  Margins negative for tumor.  1 out of 16 lymph nodes were positive for neuroendocrine tumor.  Ki-67 low at 1%.  Mitotic rate <2 mitoses/HPF. pT3N1cM0.    On 09/01/2023, restaging PET dotatate scan showed no signs of tracer avid locally recurrent tumor or metastatic disease.  On 03/11/2024, restaging PET dotatate scan showed no evidence of well differentiated neuroendocrine tumor recurrence or metastasis. Stable postsurgical change of the small bowel.  No carcinoid symptoms.  Discussed diagnosis, prognosis, plan of care, treatment options.  Reviewed NCCN guidelines.    Since she is asymptomatic, we can continue surveillance alone for her well-differentiated neuroendocrine tumor.  She experiences intermittent flushing and dizziness, which may be related to anemia or residual effects of the tumor. No diarrhea or chest palpitations reported.  - Ordered 24-hour urine 5-HIAA to assess for chemical release from potential microscopic disease - Provided list of foods to avoid for 24-hour urine collection test  I will plan to see her in 6 weeks for follow-up.

## 2024-07-25 ENCOUNTER — Encounter: Admitting: Gastroenterology

## 2024-07-29 ENCOUNTER — Telehealth: Payer: Self-pay

## 2024-07-29 ENCOUNTER — Inpatient Hospital Stay: Admitting: Oncology

## 2024-07-29 ENCOUNTER — Inpatient Hospital Stay: Attending: Licensed Clinical Social Worker

## 2024-07-29 NOTE — Telephone Encounter (Signed)
 Followed up with patient per phone due to missed Established Hematology Appointment this morning. Patient reported that her insurance changed and she started a new job. She agreed to be rescheduled in 3 mos but will contact us  sooner if she feels her new job is able to allow her to attend appointment sooner.

## 2024-08-01 ENCOUNTER — Encounter: Admitting: Gastroenterology

## 2024-08-16 ENCOUNTER — Ambulatory Visit (HOSPITAL_COMMUNITY): Admission: RE | Admit: 2024-08-16 | Source: Home / Self Care | Admitting: Otolaryngology

## 2024-08-16 ENCOUNTER — Encounter (HOSPITAL_COMMUNITY): Admission: RE | Payer: Self-pay | Source: Home / Self Care

## 2024-10-28 ENCOUNTER — Inpatient Hospital Stay: Admitting: Oncology

## 2024-10-28 ENCOUNTER — Inpatient Hospital Stay
